# Patient Record
Sex: Female | Born: 1940 | Race: White | Hispanic: No | Smoking: Never smoker
Health system: Southern US, Community
[De-identification: ages and names within clinical notes are randomized; demographics above are authoritative.]

## PROBLEM LIST (undated history)

## (undated) DIAGNOSIS — E119 Type 2 diabetes mellitus without complications: Secondary | ICD-10-CM

## (undated) DIAGNOSIS — H353290 Exudative age-related macular degeneration, unspecified eye, stage unspecified: Secondary | ICD-10-CM

## (undated) DIAGNOSIS — I10 Essential (primary) hypertension: Secondary | ICD-10-CM

## (undated) DIAGNOSIS — F32A Depression, unspecified: Secondary | ICD-10-CM

## (undated) DIAGNOSIS — J309 Allergic rhinitis, unspecified: Secondary | ICD-10-CM

## (undated) DIAGNOSIS — E785 Hyperlipidemia, unspecified: Secondary | ICD-10-CM

## (undated) DIAGNOSIS — N816 Rectocele: Secondary | ICD-10-CM

## (undated) DIAGNOSIS — K219 Gastro-esophageal reflux disease without esophagitis: Secondary | ICD-10-CM

## (undated) DIAGNOSIS — M1990 Unspecified osteoarthritis, unspecified site: Secondary | ICD-10-CM

## (undated) DIAGNOSIS — E079 Disorder of thyroid, unspecified: Secondary | ICD-10-CM

## (undated) DIAGNOSIS — E7402 Pompe disease: Secondary | ICD-10-CM

## (undated) DIAGNOSIS — G4730 Sleep apnea, unspecified: Secondary | ICD-10-CM

## (undated) DIAGNOSIS — E039 Hypothyroidism, unspecified: Secondary | ICD-10-CM

## (undated) DIAGNOSIS — F329 Major depressive disorder, single episode, unspecified: Secondary | ICD-10-CM

## (undated) DIAGNOSIS — G629 Polyneuropathy, unspecified: Secondary | ICD-10-CM

## (undated) DIAGNOSIS — T8859XA Other complications of anesthesia, initial encounter: Secondary | ICD-10-CM

## (undated) DIAGNOSIS — T4145XA Adverse effect of unspecified anesthetic, initial encounter: Secondary | ICD-10-CM

## (undated) HISTORY — DX: Depression, unspecified: F32.A

## (undated) HISTORY — DX: Exudative age-related macular degeneration, unspecified eye, stage unspecified: H35.3290

## (undated) HISTORY — PX: BREAST SURGERY: SHX581

## (undated) HISTORY — DX: Hyperlipidemia, unspecified: E78.5

## (undated) HISTORY — DX: Type 2 diabetes mellitus without complications: E11.9

## (undated) HISTORY — DX: Disorder of thyroid, unspecified: E07.9

## (undated) HISTORY — DX: Major depressive disorder, single episode, unspecified: F32.9

## (undated) HISTORY — PX: TONSILECTOMY, ADENOIDECTOMY, BILATERAL MYRINGOTOMY AND TUBES: SHX2538

## (undated) HISTORY — DX: Rectocele: N81.6

## (undated) HISTORY — DX: Pompe disease: E74.02

---

## 1973-01-09 HISTORY — PX: AUGMENTATION MAMMAPLASTY: SUR837

## 1973-01-09 HISTORY — PX: PLACEMENT OF BREAST IMPLANTS: SHX6334

## 1990-01-09 HISTORY — PX: PARATHYROIDECTOMY: SHX19

## 2003-01-10 HISTORY — PX: COLONOSCOPY: SHX174

## 2003-12-24 ENCOUNTER — Ambulatory Visit: Payer: Self-pay | Admitting: General Surgery

## 2004-11-14 ENCOUNTER — Ambulatory Visit: Payer: Self-pay | Admitting: Family Medicine

## 2004-12-09 ENCOUNTER — Ambulatory Visit: Payer: Self-pay | Admitting: Family Medicine

## 2005-01-09 ENCOUNTER — Ambulatory Visit: Payer: Self-pay | Admitting: Family Medicine

## 2005-05-01 ENCOUNTER — Ambulatory Visit: Payer: Self-pay | Admitting: Unknown Physician Specialty

## 2005-11-03 ENCOUNTER — Ambulatory Visit: Payer: Self-pay | Admitting: Cardiology

## 2006-05-21 ENCOUNTER — Ambulatory Visit: Payer: Self-pay | Admitting: Unknown Physician Specialty

## 2006-05-24 ENCOUNTER — Ambulatory Visit: Payer: Self-pay | Admitting: Unknown Physician Specialty

## 2007-06-04 ENCOUNTER — Ambulatory Visit: Payer: Self-pay | Admitting: Unknown Physician Specialty

## 2007-07-25 LAB — HM DEXA SCAN: HM Dexa Scan: NORMAL

## 2007-11-22 ENCOUNTER — Ambulatory Visit: Payer: Self-pay | Admitting: General Surgery

## 2008-04-09 ENCOUNTER — Ambulatory Visit: Payer: Self-pay | Admitting: Ophthalmology

## 2008-04-21 ENCOUNTER — Ambulatory Visit: Payer: Self-pay | Admitting: Ophthalmology

## 2008-06-11 ENCOUNTER — Ambulatory Visit: Payer: Self-pay | Admitting: Unknown Physician Specialty

## 2008-07-17 ENCOUNTER — Ambulatory Visit: Payer: Self-pay | Admitting: Ophthalmology

## 2009-02-22 ENCOUNTER — Ambulatory Visit: Payer: Self-pay | Admitting: Family Medicine

## 2009-06-17 ENCOUNTER — Ambulatory Visit: Payer: Self-pay | Admitting: Unknown Physician Specialty

## 2009-09-07 ENCOUNTER — Ambulatory Visit: Payer: Self-pay | Admitting: Gastroenterology

## 2010-04-23 ENCOUNTER — Ambulatory Visit: Payer: Self-pay | Admitting: Family Medicine

## 2010-04-28 ENCOUNTER — Ambulatory Visit: Payer: Self-pay | Admitting: Family Medicine

## 2010-06-30 ENCOUNTER — Ambulatory Visit: Payer: Self-pay | Admitting: Neurosurgery

## 2010-07-11 ENCOUNTER — Ambulatory Visit: Payer: Self-pay | Admitting: Family Medicine

## 2010-08-25 ENCOUNTER — Ambulatory Visit: Payer: Self-pay | Admitting: Family Medicine

## 2010-12-21 ENCOUNTER — Ambulatory Visit: Payer: Self-pay | Admitting: Family Medicine

## 2011-01-27 DIAGNOSIS — M722 Plantar fascial fibromatosis: Secondary | ICD-10-CM | POA: Diagnosis not present

## 2011-02-21 DIAGNOSIS — J329 Chronic sinusitis, unspecified: Secondary | ICD-10-CM | POA: Diagnosis not present

## 2011-02-21 DIAGNOSIS — J019 Acute sinusitis, unspecified: Secondary | ICD-10-CM | POA: Diagnosis not present

## 2011-02-21 DIAGNOSIS — E119 Type 2 diabetes mellitus without complications: Secondary | ICD-10-CM | POA: Diagnosis not present

## 2011-02-21 DIAGNOSIS — J309 Allergic rhinitis, unspecified: Secondary | ICD-10-CM | POA: Diagnosis not present

## 2011-05-15 DIAGNOSIS — E039 Hypothyroidism, unspecified: Secondary | ICD-10-CM | POA: Diagnosis not present

## 2011-05-15 DIAGNOSIS — I10 Essential (primary) hypertension: Secondary | ICD-10-CM | POA: Diagnosis not present

## 2011-05-15 DIAGNOSIS — E119 Type 2 diabetes mellitus without complications: Secondary | ICD-10-CM | POA: Diagnosis not present

## 2011-05-15 DIAGNOSIS — E7800 Pure hypercholesterolemia, unspecified: Secondary | ICD-10-CM | POA: Diagnosis not present

## 2011-05-31 DIAGNOSIS — I10 Essential (primary) hypertension: Secondary | ICD-10-CM | POA: Diagnosis not present

## 2011-05-31 DIAGNOSIS — E7800 Pure hypercholesterolemia, unspecified: Secondary | ICD-10-CM | POA: Diagnosis not present

## 2011-05-31 DIAGNOSIS — E039 Hypothyroidism, unspecified: Secondary | ICD-10-CM | POA: Diagnosis not present

## 2011-07-18 DIAGNOSIS — E039 Hypothyroidism, unspecified: Secondary | ICD-10-CM | POA: Diagnosis not present

## 2011-07-18 DIAGNOSIS — Z0000 Encounter for general adult medical examination without abnormal findings: Secondary | ICD-10-CM | POA: Diagnosis not present

## 2011-07-18 DIAGNOSIS — I10 Essential (primary) hypertension: Secondary | ICD-10-CM | POA: Diagnosis not present

## 2011-07-18 DIAGNOSIS — E7800 Pure hypercholesterolemia, unspecified: Secondary | ICD-10-CM | POA: Diagnosis not present

## 2011-07-21 DIAGNOSIS — M949 Disorder of cartilage, unspecified: Secondary | ICD-10-CM | POA: Diagnosis not present

## 2011-07-21 DIAGNOSIS — M899 Disorder of bone, unspecified: Secondary | ICD-10-CM | POA: Diagnosis not present

## 2011-07-21 DIAGNOSIS — E2839 Other primary ovarian failure: Secondary | ICD-10-CM | POA: Diagnosis not present

## 2011-07-21 DIAGNOSIS — N951 Menopausal and female climacteric states: Secondary | ICD-10-CM | POA: Diagnosis not present

## 2011-08-17 ENCOUNTER — Ambulatory Visit: Payer: Self-pay | Admitting: Family Medicine

## 2011-08-17 DIAGNOSIS — Z1231 Encounter for screening mammogram for malignant neoplasm of breast: Secondary | ICD-10-CM | POA: Diagnosis not present

## 2011-10-06 DIAGNOSIS — M722 Plantar fascial fibromatosis: Secondary | ICD-10-CM | POA: Diagnosis not present

## 2011-11-03 DIAGNOSIS — M722 Plantar fascial fibromatosis: Secondary | ICD-10-CM | POA: Diagnosis not present

## 2011-11-21 DIAGNOSIS — E890 Postprocedural hypothyroidism: Secondary | ICD-10-CM | POA: Diagnosis not present

## 2011-11-21 DIAGNOSIS — R5381 Other malaise: Secondary | ICD-10-CM | POA: Diagnosis not present

## 2011-11-21 DIAGNOSIS — E119 Type 2 diabetes mellitus without complications: Secondary | ICD-10-CM | POA: Diagnosis not present

## 2011-11-24 DIAGNOSIS — M722 Plantar fascial fibromatosis: Secondary | ICD-10-CM | POA: Diagnosis not present

## 2011-11-27 DIAGNOSIS — E039 Hypothyroidism, unspecified: Secondary | ICD-10-CM | POA: Diagnosis not present

## 2011-11-27 DIAGNOSIS — E119 Type 2 diabetes mellitus without complications: Secondary | ICD-10-CM | POA: Diagnosis not present

## 2011-11-27 DIAGNOSIS — E7800 Pure hypercholesterolemia, unspecified: Secondary | ICD-10-CM | POA: Diagnosis not present

## 2011-11-27 DIAGNOSIS — G4700 Insomnia, unspecified: Secondary | ICD-10-CM | POA: Diagnosis not present

## 2011-12-20 DIAGNOSIS — L820 Inflamed seborrheic keratosis: Secondary | ICD-10-CM | POA: Diagnosis not present

## 2011-12-20 DIAGNOSIS — L819 Disorder of pigmentation, unspecified: Secondary | ICD-10-CM | POA: Diagnosis not present

## 2011-12-20 DIAGNOSIS — D239 Other benign neoplasm of skin, unspecified: Secondary | ICD-10-CM | POA: Diagnosis not present

## 2011-12-20 DIAGNOSIS — L570 Actinic keratosis: Secondary | ICD-10-CM | POA: Diagnosis not present

## 2011-12-20 DIAGNOSIS — L738 Other specified follicular disorders: Secondary | ICD-10-CM | POA: Diagnosis not present

## 2012-02-05 DIAGNOSIS — G4700 Insomnia, unspecified: Secondary | ICD-10-CM | POA: Diagnosis not present

## 2012-02-05 DIAGNOSIS — E7800 Pure hypercholesterolemia, unspecified: Secondary | ICD-10-CM | POA: Diagnosis not present

## 2012-02-05 DIAGNOSIS — J320 Chronic maxillary sinusitis: Secondary | ICD-10-CM | POA: Diagnosis not present

## 2012-02-05 DIAGNOSIS — E039 Hypothyroidism, unspecified: Secondary | ICD-10-CM | POA: Diagnosis not present

## 2012-04-18 DIAGNOSIS — L820 Inflamed seborrheic keratosis: Secondary | ICD-10-CM | POA: Diagnosis not present

## 2012-04-18 DIAGNOSIS — L819 Disorder of pigmentation, unspecified: Secondary | ICD-10-CM | POA: Diagnosis not present

## 2012-04-18 DIAGNOSIS — L821 Other seborrheic keratosis: Secondary | ICD-10-CM | POA: Diagnosis not present

## 2012-04-18 DIAGNOSIS — L570 Actinic keratosis: Secondary | ICD-10-CM | POA: Diagnosis not present

## 2012-05-17 DIAGNOSIS — M722 Plantar fascial fibromatosis: Secondary | ICD-10-CM | POA: Diagnosis not present

## 2012-05-20 DIAGNOSIS — E0590 Thyrotoxicosis, unspecified without thyrotoxic crisis or storm: Secondary | ICD-10-CM | POA: Diagnosis not present

## 2012-05-20 DIAGNOSIS — E119 Type 2 diabetes mellitus without complications: Secondary | ICD-10-CM | POA: Diagnosis not present

## 2012-05-30 DIAGNOSIS — E039 Hypothyroidism, unspecified: Secondary | ICD-10-CM | POA: Diagnosis not present

## 2012-05-30 DIAGNOSIS — E119 Type 2 diabetes mellitus without complications: Secondary | ICD-10-CM | POA: Diagnosis not present

## 2012-05-30 DIAGNOSIS — E209 Hypoparathyroidism, unspecified: Secondary | ICD-10-CM | POA: Diagnosis not present

## 2012-07-23 DIAGNOSIS — E039 Hypothyroidism, unspecified: Secondary | ICD-10-CM | POA: Diagnosis not present

## 2012-07-23 DIAGNOSIS — Z0000 Encounter for general adult medical examination without abnormal findings: Secondary | ICD-10-CM | POA: Diagnosis not present

## 2012-07-23 DIAGNOSIS — E7800 Pure hypercholesterolemia, unspecified: Secondary | ICD-10-CM | POA: Diagnosis not present

## 2012-07-23 DIAGNOSIS — G4700 Insomnia, unspecified: Secondary | ICD-10-CM | POA: Diagnosis not present

## 2012-07-24 DIAGNOSIS — Z124 Encounter for screening for malignant neoplasm of cervix: Secondary | ICD-10-CM | POA: Diagnosis not present

## 2012-07-24 DIAGNOSIS — Z01419 Encounter for gynecological examination (general) (routine) without abnormal findings: Secondary | ICD-10-CM | POA: Diagnosis not present

## 2012-07-24 DIAGNOSIS — Z1151 Encounter for screening for human papillomavirus (HPV): Secondary | ICD-10-CM | POA: Diagnosis not present

## 2012-07-24 DIAGNOSIS — Z1231 Encounter for screening mammogram for malignant neoplasm of breast: Secondary | ICD-10-CM | POA: Diagnosis not present

## 2012-08-15 DIAGNOSIS — L819 Disorder of pigmentation, unspecified: Secondary | ICD-10-CM | POA: Diagnosis not present

## 2012-08-15 DIAGNOSIS — L820 Inflamed seborrheic keratosis: Secondary | ICD-10-CM | POA: Diagnosis not present

## 2012-08-15 DIAGNOSIS — L821 Other seborrheic keratosis: Secondary | ICD-10-CM | POA: Diagnosis not present

## 2012-12-26 DIAGNOSIS — J329 Chronic sinusitis, unspecified: Secondary | ICD-10-CM | POA: Diagnosis not present

## 2012-12-26 DIAGNOSIS — E785 Hyperlipidemia, unspecified: Secondary | ICD-10-CM | POA: Diagnosis not present

## 2012-12-26 DIAGNOSIS — I10 Essential (primary) hypertension: Secondary | ICD-10-CM | POA: Diagnosis not present

## 2012-12-26 DIAGNOSIS — E119 Type 2 diabetes mellitus without complications: Secondary | ICD-10-CM | POA: Diagnosis not present

## 2012-12-26 DIAGNOSIS — E039 Hypothyroidism, unspecified: Secondary | ICD-10-CM | POA: Diagnosis not present

## 2012-12-26 DIAGNOSIS — R5381 Other malaise: Secondary | ICD-10-CM | POA: Diagnosis not present

## 2012-12-26 DIAGNOSIS — J069 Acute upper respiratory infection, unspecified: Secondary | ICD-10-CM | POA: Diagnosis not present

## 2013-01-06 DIAGNOSIS — E7800 Pure hypercholesterolemia, unspecified: Secondary | ICD-10-CM | POA: Diagnosis not present

## 2013-01-06 DIAGNOSIS — E119 Type 2 diabetes mellitus without complications: Secondary | ICD-10-CM | POA: Diagnosis not present

## 2013-01-06 DIAGNOSIS — E039 Hypothyroidism, unspecified: Secondary | ICD-10-CM | POA: Diagnosis not present

## 2013-01-20 DIAGNOSIS — E119 Type 2 diabetes mellitus without complications: Secondary | ICD-10-CM | POA: Diagnosis not present

## 2013-01-20 DIAGNOSIS — I10 Essential (primary) hypertension: Secondary | ICD-10-CM | POA: Diagnosis not present

## 2013-01-20 DIAGNOSIS — J329 Chronic sinusitis, unspecified: Secondary | ICD-10-CM | POA: Diagnosis not present

## 2013-01-20 DIAGNOSIS — R5381 Other malaise: Secondary | ICD-10-CM | POA: Diagnosis not present

## 2013-01-20 DIAGNOSIS — E785 Hyperlipidemia, unspecified: Secondary | ICD-10-CM | POA: Diagnosis not present

## 2013-01-20 DIAGNOSIS — J069 Acute upper respiratory infection, unspecified: Secondary | ICD-10-CM | POA: Diagnosis not present

## 2013-01-20 DIAGNOSIS — E039 Hypothyroidism, unspecified: Secondary | ICD-10-CM | POA: Diagnosis not present

## 2013-01-20 DIAGNOSIS — R5383 Other fatigue: Secondary | ICD-10-CM | POA: Diagnosis not present

## 2013-02-14 DIAGNOSIS — E785 Hyperlipidemia, unspecified: Secondary | ICD-10-CM | POA: Diagnosis not present

## 2013-02-14 DIAGNOSIS — E039 Hypothyroidism, unspecified: Secondary | ICD-10-CM | POA: Diagnosis not present

## 2013-02-14 DIAGNOSIS — S91309A Unspecified open wound, unspecified foot, initial encounter: Secondary | ICD-10-CM | POA: Diagnosis not present

## 2013-02-14 DIAGNOSIS — I10 Essential (primary) hypertension: Secondary | ICD-10-CM | POA: Diagnosis not present

## 2013-02-14 DIAGNOSIS — J069 Acute upper respiratory infection, unspecified: Secondary | ICD-10-CM | POA: Diagnosis not present

## 2013-02-14 DIAGNOSIS — Z23 Encounter for immunization: Secondary | ICD-10-CM | POA: Diagnosis not present

## 2013-02-14 DIAGNOSIS — S0083XA Contusion of other part of head, initial encounter: Secondary | ICD-10-CM | POA: Diagnosis not present

## 2013-02-14 DIAGNOSIS — J329 Chronic sinusitis, unspecified: Secondary | ICD-10-CM | POA: Diagnosis not present

## 2013-02-14 DIAGNOSIS — S0003XA Contusion of scalp, initial encounter: Secondary | ICD-10-CM | POA: Diagnosis not present

## 2013-07-14 DIAGNOSIS — I10 Essential (primary) hypertension: Secondary | ICD-10-CM | POA: Diagnosis not present

## 2013-07-14 DIAGNOSIS — E7800 Pure hypercholesterolemia, unspecified: Secondary | ICD-10-CM | POA: Diagnosis not present

## 2013-07-14 DIAGNOSIS — F411 Generalized anxiety disorder: Secondary | ICD-10-CM | POA: Diagnosis not present

## 2013-07-14 DIAGNOSIS — F320 Major depressive disorder, single episode, mild: Secondary | ICD-10-CM | POA: Diagnosis not present

## 2013-07-14 DIAGNOSIS — R5381 Other malaise: Secondary | ICD-10-CM | POA: Diagnosis not present

## 2013-07-14 DIAGNOSIS — R5383 Other fatigue: Secondary | ICD-10-CM | POA: Diagnosis not present

## 2013-07-15 DIAGNOSIS — G5600 Carpal tunnel syndrome, unspecified upper limb: Secondary | ICD-10-CM | POA: Diagnosis not present

## 2013-08-20 DIAGNOSIS — F320 Major depressive disorder, single episode, mild: Secondary | ICD-10-CM | POA: Diagnosis not present

## 2013-08-20 DIAGNOSIS — F411 Generalized anxiety disorder: Secondary | ICD-10-CM | POA: Diagnosis not present

## 2013-08-20 DIAGNOSIS — E7800 Pure hypercholesterolemia, unspecified: Secondary | ICD-10-CM | POA: Diagnosis not present

## 2013-08-20 DIAGNOSIS — I10 Essential (primary) hypertension: Secondary | ICD-10-CM | POA: Diagnosis not present

## 2013-08-20 DIAGNOSIS — J019 Acute sinusitis, unspecified: Secondary | ICD-10-CM | POA: Diagnosis not present

## 2013-08-22 ENCOUNTER — Ambulatory Visit (INDEPENDENT_AMBULATORY_CARE_PROVIDER_SITE_OTHER): Payer: Medicare Other | Admitting: Podiatry

## 2013-08-22 ENCOUNTER — Ambulatory Visit (INDEPENDENT_AMBULATORY_CARE_PROVIDER_SITE_OTHER): Payer: Medicare Other

## 2013-08-22 ENCOUNTER — Other Ambulatory Visit: Payer: Self-pay | Admitting: *Deleted

## 2013-08-22 ENCOUNTER — Encounter: Payer: Self-pay | Admitting: Podiatry

## 2013-08-22 VITALS — BP 113/68 | HR 81 | Resp 16 | Ht 60.0 in | Wt 148.0 lb

## 2013-08-22 DIAGNOSIS — M2020 Hallux rigidus, unspecified foot: Secondary | ICD-10-CM | POA: Diagnosis not present

## 2013-08-22 DIAGNOSIS — M779 Enthesopathy, unspecified: Secondary | ICD-10-CM

## 2013-08-22 DIAGNOSIS — M2021 Hallux rigidus, right foot: Secondary | ICD-10-CM

## 2013-08-22 MED ORDER — TRIAMCINOLONE ACETONIDE 10 MG/ML IJ SUSP
10.0000 mg | Freq: Once | INTRAMUSCULAR | Status: AC
  Administered 2013-08-22: 10 mg

## 2013-08-22 NOTE — Progress Notes (Signed)
   Subjective:    Patient ID: Nicole Young, female    DOB: [birth year], 73 y.o.   MRN: [id number]  HPI Comments: Right foot is acting up again      Review of Systems  HENT:       Sinus infection       Objective:   Physical Exam        Assessment & Plan:

## 2013-08-22 NOTE — Progress Notes (Signed)
Subjective:     Patient ID: Nicole Young, female   DOB: October 27, 1940, 73 y.o.   MRN: [id number]  HPI patient states I'm having pain in my right foot and also having pain in my big toe joint right. States it's been going on for months   Review of Systems     Objective:   Physical Exam Neurovascular status is intact with no history changes and tenderness noted third metatarsophalangeal joint right with inflammation and fluid buildup noted. Patient is also noted to have reduced range of motion and pain in the first metatarsal phalangeal joint right    Assessment:     Capsulitis third MPJ right of an acute nature and hallux limitus condition right with inflammation    Plan:     Reviewed both conditions and focused on the capsule first. Did a proximal nerve block and then aspirated the joint giving out a small amount of fluid and injected with half cc of dexamethasone Kenalog combination and applied thick plantar pad. Discussed hallux limitus and we will watch it and decide whether or not eventually it we'll need correction

## 2013-08-26 DIAGNOSIS — M76899 Other specified enthesopathies of unspecified lower limb, excluding foot: Secondary | ICD-10-CM | POA: Diagnosis not present

## 2013-08-26 DIAGNOSIS — G5600 Carpal tunnel syndrome, unspecified upper limb: Secondary | ICD-10-CM | POA: Diagnosis not present

## 2013-09-04 DIAGNOSIS — I10 Essential (primary) hypertension: Secondary | ICD-10-CM | POA: Diagnosis not present

## 2013-09-04 DIAGNOSIS — E119 Type 2 diabetes mellitus without complications: Secondary | ICD-10-CM | POA: Diagnosis not present

## 2013-09-04 DIAGNOSIS — J329 Chronic sinusitis, unspecified: Secondary | ICD-10-CM | POA: Diagnosis not present

## 2013-09-04 DIAGNOSIS — E7800 Pure hypercholesterolemia, unspecified: Secondary | ICD-10-CM | POA: Diagnosis not present

## 2013-09-04 DIAGNOSIS — J069 Acute upper respiratory infection, unspecified: Secondary | ICD-10-CM | POA: Diagnosis not present

## 2013-09-12 ENCOUNTER — Ambulatory Visit (INDEPENDENT_AMBULATORY_CARE_PROVIDER_SITE_OTHER): Payer: Medicare Other | Admitting: Podiatry

## 2013-09-12 ENCOUNTER — Encounter: Payer: Self-pay | Admitting: Podiatry

## 2013-09-12 VITALS — BP 112/68 | HR 80 | Resp 16

## 2013-09-12 DIAGNOSIS — M779 Enthesopathy, unspecified: Secondary | ICD-10-CM | POA: Diagnosis not present

## 2013-09-12 DIAGNOSIS — M2020 Hallux rigidus, unspecified foot: Secondary | ICD-10-CM | POA: Diagnosis not present

## 2013-09-12 DIAGNOSIS — M2021 Hallux rigidus, right foot: Secondary | ICD-10-CM

## 2013-09-15 NOTE — Progress Notes (Signed)
Subjective:     Patient ID: Nicole Young, female   DOB: 1940/11/14, 73 y.o.   MRN: [id number]  HPI patient presents stating the pain has reduced and my orthotics are not working well and I go to [place] in 2 [employer] orthotics are wearing out   Review of Systems     Objective:   Physical Exam Neurovascular status intact with muscle strength adequate and well oriented x3. Patient does have moderate discomfort fourth MPJ right but quite a bit improved from previous visit     Assessment:     Improved capsulitis right fourth MPJ    Plan:

## 2013-09-19 ENCOUNTER — Encounter: Payer: Self-pay | Admitting: Podiatry

## 2013-10-13 ENCOUNTER — Telehealth: Payer: Self-pay | Admitting: *Deleted

## 2013-10-13 NOTE — Telephone Encounter (Signed)
CALLED AND LEFT MESSAGE LETTING PT KNOW ORTHOTICS ARE HERE. NO APPT NEEDED.

## 2013-10-14 ENCOUNTER — Encounter: Payer: Self-pay | Admitting: *Deleted

## 2013-10-14 NOTE — Progress Notes (Signed)
SENT PT POSTCARD LETTING HER KNOW ORTHOTICS ARE HERE.

## 2013-11-06 DIAGNOSIS — Z23 Encounter for immunization: Secondary | ICD-10-CM | POA: Diagnosis not present

## 2013-11-11 ENCOUNTER — Ambulatory Visit: Payer: Self-pay | Admitting: Family Medicine

## 2013-11-11 DIAGNOSIS — R072 Precordial pain: Secondary | ICD-10-CM | POA: Diagnosis not present

## 2013-11-11 DIAGNOSIS — Z1389 Encounter for screening for other disorder: Secondary | ICD-10-CM | POA: Diagnosis not present

## 2013-11-11 DIAGNOSIS — R05 Cough: Secondary | ICD-10-CM | POA: Diagnosis not present

## 2013-11-11 DIAGNOSIS — Z0000 Encounter for general adult medical examination without abnormal findings: Secondary | ICD-10-CM | POA: Diagnosis not present

## 2013-12-01 DIAGNOSIS — I10 Essential (primary) hypertension: Secondary | ICD-10-CM | POA: Diagnosis not present

## 2013-12-01 DIAGNOSIS — R5383 Other fatigue: Secondary | ICD-10-CM | POA: Diagnosis not present

## 2013-12-01 DIAGNOSIS — J050 Acute obstructive laryngitis [croup]: Secondary | ICD-10-CM | POA: Diagnosis not present

## 2013-12-01 DIAGNOSIS — E785 Hyperlipidemia, unspecified: Secondary | ICD-10-CM | POA: Diagnosis not present

## 2013-12-01 DIAGNOSIS — G2581 Restless legs syndrome: Secondary | ICD-10-CM | POA: Diagnosis not present

## 2013-12-01 DIAGNOSIS — E039 Hypothyroidism, unspecified: Secondary | ICD-10-CM | POA: Diagnosis not present

## 2013-12-01 DIAGNOSIS — E780 Pure hypercholesterolemia: Secondary | ICD-10-CM | POA: Diagnosis not present

## 2013-12-01 DIAGNOSIS — E119 Type 2 diabetes mellitus without complications: Secondary | ICD-10-CM | POA: Diagnosis not present

## 2013-12-01 LAB — TSH: TSH: 0.98 u[IU]/mL (ref <=5.90)

## 2013-12-08 DIAGNOSIS — D1724 Benign lipomatous neoplasm of skin and subcutaneous tissue of left leg: Secondary | ICD-10-CM | POA: Diagnosis not present

## 2013-12-08 DIAGNOSIS — M25562 Pain in left knee: Secondary | ICD-10-CM | POA: Diagnosis not present

## 2013-12-08 DIAGNOSIS — M71559 Other bursitis, not elsewhere classified, unspecified hip: Secondary | ICD-10-CM | POA: Diagnosis not present

## 2013-12-12 ENCOUNTER — Ambulatory Visit: Payer: Self-pay | Admitting: Family Medicine

## 2013-12-12 DIAGNOSIS — Z1231 Encounter for screening mammogram for malignant neoplasm of breast: Secondary | ICD-10-CM | POA: Diagnosis not present

## 2013-12-15 DIAGNOSIS — H26492 Other secondary cataract, left eye: Secondary | ICD-10-CM | POA: Diagnosis not present

## 2013-12-16 DIAGNOSIS — E039 Hypothyroidism, unspecified: Secondary | ICD-10-CM | POA: Diagnosis not present

## 2013-12-16 DIAGNOSIS — Z23 Encounter for immunization: Secondary | ICD-10-CM | POA: Diagnosis not present

## 2013-12-16 DIAGNOSIS — I10 Essential (primary) hypertension: Secondary | ICD-10-CM | POA: Diagnosis not present

## 2013-12-16 DIAGNOSIS — E785 Hyperlipidemia, unspecified: Secondary | ICD-10-CM | POA: Diagnosis not present

## 2013-12-23 DIAGNOSIS — E119 Type 2 diabetes mellitus without complications: Secondary | ICD-10-CM | POA: Diagnosis not present

## 2014-01-12 DIAGNOSIS — Z23 Encounter for immunization: Secondary | ICD-10-CM | POA: Diagnosis not present

## 2014-01-12 DIAGNOSIS — E039 Hypothyroidism, unspecified: Secondary | ICD-10-CM | POA: Diagnosis not present

## 2014-01-12 DIAGNOSIS — I10 Essential (primary) hypertension: Secondary | ICD-10-CM | POA: Diagnosis not present

## 2014-01-12 DIAGNOSIS — J069 Acute upper respiratory infection, unspecified: Secondary | ICD-10-CM | POA: Diagnosis not present

## 2014-01-21 DIAGNOSIS — J329 Chronic sinusitis, unspecified: Secondary | ICD-10-CM | POA: Diagnosis not present

## 2014-01-21 DIAGNOSIS — R05 Cough: Secondary | ICD-10-CM | POA: Diagnosis not present

## 2014-01-21 DIAGNOSIS — E785 Hyperlipidemia, unspecified: Secondary | ICD-10-CM | POA: Diagnosis not present

## 2014-01-21 DIAGNOSIS — Z23 Encounter for immunization: Secondary | ICD-10-CM | POA: Diagnosis not present

## 2014-01-29 DIAGNOSIS — E785 Hyperlipidemia, unspecified: Secondary | ICD-10-CM | POA: Diagnosis not present

## 2014-01-29 LAB — LIPID PANEL
CHOLESTEROL: 162 mg/dL (ref 0–200)
HDL: 67 mg/dL (ref 35–70)
LDL CALC: 73 mg/dL
LDl/HDL Ratio: 1.1
Triglycerides: 111 mg/dL (ref 40–160)

## 2014-02-04 DIAGNOSIS — L578 Other skin changes due to chronic exposure to nonionizing radiation: Secondary | ICD-10-CM | POA: Diagnosis not present

## 2014-02-04 DIAGNOSIS — L814 Other melanin hyperpigmentation: Secondary | ICD-10-CM | POA: Diagnosis not present

## 2014-02-04 DIAGNOSIS — L99 Other disorders of skin and subcutaneous tissue in diseases classified elsewhere: Secondary | ICD-10-CM | POA: Diagnosis not present

## 2014-02-04 DIAGNOSIS — D1800 Hemangioma unspecified site: Secondary | ICD-10-CM | POA: Diagnosis not present

## 2014-02-04 DIAGNOSIS — B079 Viral wart, unspecified: Secondary | ICD-10-CM | POA: Diagnosis not present

## 2014-02-04 DIAGNOSIS — L820 Inflamed seborrheic keratosis: Secondary | ICD-10-CM | POA: Diagnosis not present

## 2014-02-04 DIAGNOSIS — L821 Other seborrheic keratosis: Secondary | ICD-10-CM | POA: Diagnosis not present

## 2014-02-04 DIAGNOSIS — D229 Melanocytic nevi, unspecified: Secondary | ICD-10-CM | POA: Diagnosis not present

## 2014-02-04 DIAGNOSIS — Z1283 Encounter for screening for malignant neoplasm of skin: Secondary | ICD-10-CM | POA: Diagnosis not present

## 2014-03-10 DIAGNOSIS — N816 Rectocele: Secondary | ICD-10-CM | POA: Diagnosis not present

## 2014-03-10 DIAGNOSIS — Z01419 Encounter for gynecological examination (general) (routine) without abnormal findings: Secondary | ICD-10-CM | POA: Diagnosis not present

## 2014-03-25 DIAGNOSIS — B079 Viral wart, unspecified: Secondary | ICD-10-CM | POA: Diagnosis not present

## 2014-03-25 DIAGNOSIS — L821 Other seborrheic keratosis: Secondary | ICD-10-CM | POA: Diagnosis not present

## 2014-03-25 DIAGNOSIS — L820 Inflamed seborrheic keratosis: Secondary | ICD-10-CM | POA: Diagnosis not present

## 2014-03-30 ENCOUNTER — Encounter: Payer: Self-pay | Admitting: *Deleted

## 2014-03-31 DIAGNOSIS — E119 Type 2 diabetes mellitus without complications: Secondary | ICD-10-CM | POA: Diagnosis not present

## 2014-04-14 DIAGNOSIS — E119 Type 2 diabetes mellitus without complications: Secondary | ICD-10-CM | POA: Diagnosis not present

## 2014-04-15 ENCOUNTER — Encounter: Payer: Self-pay | Admitting: General Surgery

## 2014-04-16 ENCOUNTER — Ambulatory Visit (INDEPENDENT_AMBULATORY_CARE_PROVIDER_SITE_OTHER): Payer: Medicare Other | Admitting: General Surgery

## 2014-04-16 ENCOUNTER — Encounter: Payer: Self-pay | Admitting: General Surgery

## 2014-04-16 VITALS — BP 112/64 | HR 80 | Resp 10 | Ht 60.0 in | Wt 151.0 lb

## 2014-04-16 DIAGNOSIS — Z1211 Encounter for screening for malignant neoplasm of colon: Secondary | ICD-10-CM | POA: Diagnosis not present

## 2014-04-16 MED ORDER — POLYETHYLENE GLYCOL 3350 17 GM/SCOOP PO POWD: ORAL | Status: DC

## 2014-04-16 NOTE — Patient Instructions (Addendum)
Colonoscopy A colonoscopy is an exam to look at the entire large intestine (colon). This exam can help find problems such as tumors, polyps, inflammation, and areas of bleeding. The exam takes about 1 hour.  LET [facility] CARE PROVIDER KNOW ABOUT:   Any allergies you have.  All medicines you are taking, including vitamins, herbs, eye drops, creams, and over-the-counter medicines.  Previous problems you or members of your family have had with the use of anesthetics.  Any blood disorders you have.  Previous surgeries you have had.  Medical conditions you have. RISKS AND COMPLICATIONS  Generally, this is a safe procedure. However, as with any procedure, complications can occur. Possible complications include:  Bleeding.  Tearing or rupture of the colon wall.  Reaction to medicines given during the exam.  Infection (rare). BEFORE THE PROCEDURE   Ask your health care provider about changing or stopping your regular medicines.  You may be prescribed an oral bowel prep. This involves drinking a large amount of medicated liquid, starting the day before your procedure. The liquid will cause you to have multiple loose stools until your stool is almost clear or light green. This cleans out your colon in preparation for the procedure.  Do not eat or drink anything else once you have started the bowel prep, unless your health care provider tells you it is safe to do so.  Arrange for someone to drive you home after the procedure. PROCEDURE   You will be given medicine to help you relax (sedative).  You will lie on your side with your knees bent.  A long, flexible tube with a light and camera on the end (colonoscope) will be inserted through the rectum and into the colon. The camera sends video back to a computer screen as it moves through the colon. The colonoscope also releases carbon dioxide gas to inflate the colon. This helps your health care provider see the area better.  During  the exam, your health care provider may take a small tissue sample (biopsy) to be examined under a microscope if any abnormalities are found.  The exam is finished when the entire colon has been viewed. AFTER THE PROCEDURE   Do not drive for 24 hours after the exam.  You may have a small amount of blood in your stool.  You may pass moderate amounts of gas and have mild abdominal cramping or bloating. This is caused by the gas used to inflate your colon during the exam.  Ask when your test results will be ready and how you will get your results. Make sure you get your test results. Document Released: 12/24/1999 Document Revised: 10/16/2012 Document Reviewed: 09/02/2012 [facility] Patient Information 2015 [place], [place]. This information is not intended to replace advice given to you by your health care provider. Make sure you discuss any questions you have with your health care provider.  Patient has been scheduled for a colonoscopy on 06-24-14 at [facility]. This patient will hold Metformin day of colonoscopy prep and procedure.

## 2014-04-16 NOTE — Progress Notes (Signed)
Patient ID: Nicole Young, female   DOB: 1940/08/29, 74 y.o.   MRN: [id number]  Chief Complaint  Patient presents with  . Colonoscopy    HPI Nicole Young is a 74 y.o. female.  Here today to discuss having a colonoscopy. Denies any gastrointestinal issues. Her last colonoscopy was 2005. Denies family history of colon cancer. Bowels are regular and daily.   HPI  Past Medical History  Diagnosis Date  . Thyroid disease   . Diabetes mellitus without complication   . Depression   . Rectocele     Past Surgical History  Procedure Laterality Date  . Tonsilectomy, adenoidectomy, bilateral myringotomy and tubes    . Parathyroidectomy  1992  . Placement of breast implants  1975  . Colonoscopy  2005    Dr Bary Castilla    Family History  Problem Relation Age of Onset  . Cancer Father     prostate  . Hypertension Mother     Social History History  Substance Use Topics  . Smoking status: Never Smoker   . Smokeless tobacco: Never Used  . Alcohol Use: 0.0 oz/week    0 Standard drinks or equivalent per week     Comment: occasionally    Allergies  Allergen Reactions  . Neosporin [Neomycin-Bacitracin Zn-Polymyx] Swelling  . Polysorbate Other (See Comments)    Other Reaction: Other reaction ,   . Thimerosal Swelling    Current Outpatient Prescriptions  Medication Sig Dispense Refill  . Calcium-Vitamin D 600-200 MG-UNIT per tablet Take by mouth.    . Cholecalciferol (VITAMIN D) 2000 UNITS CAPS Take by mouth daily.    . CRESTOR 20 MG tablet Take 20 mg by mouth daily.     Marland Kitchen FARXIGA 10 MG TABS tablet 10 mg daily.     . Magnesium 200 MG TABS Take by mouth daily.    . metFORMIN (GLUCOPHAGE-XR) 500 MG 24 hr tablet Take 500 mg by mouth daily.     . Multiple Vitamin (MULTIVITAMIN) capsule Take 1 capsule by mouth daily.    . sertraline (ZOLOFT) 50 MG tablet Take 50 mg by mouth daily.     Marland Kitchen SYNTHROID 75 MCG tablet Take 75 mcg by mouth daily before breakfast.     . polyethylene  glycol powder (GLYCOLAX/MIRALAX) powder 255 grams one bottle for colonoscopy prep 255 g 0   No current facility-administered medications for this visit.    Review of Systems Review of Systems  Constitutional: Negative.   Respiratory: Negative.   Cardiovascular: Negative.     Blood pressure 112/64, pulse 80, resp. rate 10, height 5' (1.524 m), weight 151 lb (68.493 kg).  Physical Exam Physical Exam  Constitutional: She is oriented to person, place, and time. She appears well-developed and well-nourished.  Neck: Neck supple.  Cardiovascular: Normal rate, regular rhythm and normal heart sounds.   Pulmonary/Chest: Effort normal and breath sounds normal.  Lymphadenopathy:    She has no cervical adenopathy.  Neurological: She is alert and oriented to person, place, and time.  Skin: Skin is warm and dry.    Data Reviewed Screening colonoscopy completed 12/24/2003 was normal.  Assessment    Added for screening colonoscopy.    Plan        Colonoscopy with possible biopsy/polypectomy prn: Information regarding the procedure, including its potential risks and complications (including but not limited to perforation of the bowel, which may require emergency surgery to repair, and bleeding) was verbally given to the patient. Educational information regarding lower instestinal endoscopy  was given to the patient. Written instructions for how to complete the bowel prep using Miralax were provided. The importance of drinking ample fluids to avoid dehydration as a result of the prep emphasized.  Patient has been scheduled for a colonoscopy on 06-24-14 at [facility]. This patient will hold Metformin day of colonoscopy prep and procedure.  PCP:  Cranford Mon, Magdalene Molly, Forest Gleason 04/17/2014, 9:14 PM

## 2014-04-17 ENCOUNTER — Other Ambulatory Visit: Payer: Self-pay | Admitting: General Surgery

## 2014-04-17 DIAGNOSIS — Z1211 Encounter for screening for malignant neoplasm of colon: Secondary | ICD-10-CM

## 2014-05-04 DIAGNOSIS — E785 Hyperlipidemia, unspecified: Secondary | ICD-10-CM | POA: Diagnosis not present

## 2014-05-04 DIAGNOSIS — R1084 Generalized abdominal pain: Secondary | ICD-10-CM | POA: Diagnosis not present

## 2014-05-04 DIAGNOSIS — I10 Essential (primary) hypertension: Secondary | ICD-10-CM | POA: Diagnosis not present

## 2014-05-04 DIAGNOSIS — Z23 Encounter for immunization: Secondary | ICD-10-CM | POA: Diagnosis not present

## 2014-05-04 DIAGNOSIS — R109 Unspecified abdominal pain: Secondary | ICD-10-CM | POA: Diagnosis not present

## 2014-05-04 LAB — CBC AND DIFFERENTIAL
HEMATOCRIT: 43 % (ref 36–46)
HEMOGLOBIN: 14.2 g/dL (ref 12.0–16.0)
NEUTROS ABS: 5 /uL
PLATELETS: 245 10*3/uL (ref 150–399)
WBC: 8.8 10*3/mL

## 2014-05-04 LAB — HEPATIC FUNCTION PANEL
ALK PHOS: 47 U/L (ref 25–125)
ALT: 22 U/L (ref 7–35)
AST: 19 U/L (ref 13–35)

## 2014-05-04 LAB — BASIC METABOLIC PANEL
BUN: 19 mg/dL (ref 4–21)
Creatinine: 0.7 mg/dL (ref 0.5–1.1)
GLUCOSE: 122 mg/dL
SODIUM: 142 mmol/L (ref 137–147)

## 2014-05-05 ENCOUNTER — Other Ambulatory Visit: Payer: Self-pay | Admitting: Family Medicine

## 2014-05-05 DIAGNOSIS — R1011 Right upper quadrant pain: Secondary | ICD-10-CM

## 2014-05-14 ENCOUNTER — Ambulatory Visit: Payer: Self-pay

## 2014-05-18 ENCOUNTER — Other Ambulatory Visit: Payer: Self-pay | Admitting: Family Medicine

## 2014-05-18 ENCOUNTER — Ambulatory Visit
Admission: RE | Admit: 2014-05-18 | Discharge: 2014-05-18 | Disposition: A | Discharge status: Home / Self Care | Payer: Medicare Other | Source: Ambulatory Visit | Attending: Family Medicine | Admitting: Family Medicine

## 2014-05-18 DIAGNOSIS — R109 Unspecified abdominal pain: Secondary | ICD-10-CM | POA: Diagnosis not present

## 2014-05-18 DIAGNOSIS — R1011 Right upper quadrant pain: Secondary | ICD-10-CM

## 2014-05-18 DIAGNOSIS — K769 Liver disease, unspecified: Secondary | ICD-10-CM | POA: Insufficient documentation

## 2014-05-21 DIAGNOSIS — R109 Unspecified abdominal pain: Secondary | ICD-10-CM | POA: Diagnosis not present

## 2014-05-21 DIAGNOSIS — E119 Type 2 diabetes mellitus without complications: Secondary | ICD-10-CM | POA: Diagnosis not present

## 2014-05-21 DIAGNOSIS — R7309 Other abnormal glucose: Secondary | ICD-10-CM | POA: Diagnosis not present

## 2014-05-21 DIAGNOSIS — E039 Hypothyroidism, unspecified: Secondary | ICD-10-CM | POA: Diagnosis not present

## 2014-05-21 DIAGNOSIS — I10 Essential (primary) hypertension: Secondary | ICD-10-CM | POA: Diagnosis not present

## 2014-05-21 LAB — HEMOGLOBIN A1C: Hgb A1c MFr Bld: 6.6 % — AB (ref 4.0–6.0)

## 2014-06-24 ENCOUNTER — Encounter: Payer: Self-pay | Admitting: General Surgery

## 2014-06-24 ENCOUNTER — Ambulatory Visit
Admission: RE | Admit: 2014-06-24 | Discharge: 2014-06-24 | Disposition: A | Discharge status: Home / Self Care | Payer: Medicare Other | Source: Ambulatory Visit | Attending: General Surgery | Admitting: General Surgery

## 2014-06-24 ENCOUNTER — Ambulatory Visit: Payer: Medicare Other | Admitting: *Deleted

## 2014-06-24 ENCOUNTER — Encounter
Admission: RE | Disposition: A | Discharge status: Home / Self Care | Payer: Self-pay | Source: Ambulatory Visit | Attending: General Surgery

## 2014-06-24 DIAGNOSIS — N816 Rectocele: Secondary | ICD-10-CM | POA: Insufficient documentation

## 2014-06-24 DIAGNOSIS — K621 Rectal polyp: Secondary | ICD-10-CM | POA: Insufficient documentation

## 2014-06-24 DIAGNOSIS — D128 Benign neoplasm of rectum: Secondary | ICD-10-CM | POA: Diagnosis not present

## 2014-06-24 DIAGNOSIS — K5730 Diverticulosis of large intestine without perforation or abscess without bleeding: Secondary | ICD-10-CM | POA: Insufficient documentation

## 2014-06-24 DIAGNOSIS — Z888 Allergy status to other drugs, medicaments and biological substances status: Secondary | ICD-10-CM | POA: Insufficient documentation

## 2014-06-24 DIAGNOSIS — E119 Type 2 diabetes mellitus without complications: Secondary | ICD-10-CM | POA: Insufficient documentation

## 2014-06-24 DIAGNOSIS — Z79899 Other long term (current) drug therapy: Secondary | ICD-10-CM | POA: Insufficient documentation

## 2014-06-24 DIAGNOSIS — K635 Polyp of colon: Secondary | ICD-10-CM | POA: Diagnosis not present

## 2014-06-24 DIAGNOSIS — Z1211 Encounter for screening for malignant neoplasm of colon: Secondary | ICD-10-CM | POA: Insufficient documentation

## 2014-06-24 DIAGNOSIS — F329 Major depressive disorder, single episode, unspecified: Secondary | ICD-10-CM | POA: Diagnosis not present

## 2014-06-24 DIAGNOSIS — E079 Disorder of thyroid, unspecified: Secondary | ICD-10-CM | POA: Diagnosis not present

## 2014-06-24 HISTORY — PX: COLONOSCOPY: SHX5424

## 2014-06-24 LAB — GLUCOSE, CAPILLARY: Glucose-Capillary: 116 mg/dL — ABNORMAL HIGH (ref 65–99)

## 2014-06-24 SURGERY — COLONOSCOPY
Anesthesia: General

## 2014-06-24 MED ORDER — SODIUM CHLORIDE 0.9 % IV SOLN
INTRAVENOUS | Status: DC
  Administered 2014-06-24: 09:00:00 via INTRAVENOUS

## 2014-06-24 MED ORDER — LACTATED RINGERS IV SOLN
INTRAVENOUS | Status: DC | PRN
  Administered 2014-06-24: 09:00:00 via INTRAVENOUS

## 2014-06-24 MED ORDER — FENTANYL CITRATE (PF) 100 MCG/2ML IJ SOLN
INTRAMUSCULAR | Status: DC | PRN
  Administered 2014-06-24: 50 ug via INTRAVENOUS

## 2014-06-24 MED ORDER — MIDAZOLAM HCL 2 MG/2ML IJ SOLN
INTRAMUSCULAR | Status: DC | PRN
  Administered 2014-06-24: 1 mg via INTRAVENOUS

## 2014-06-24 MED ORDER — PROPOFOL INFUSION 10 MG/ML OPTIME
INTRAVENOUS | Status: DC | PRN
  Administered 2014-06-24: 100 ug/kg/min via INTRAVENOUS

## 2014-06-24 NOTE — Transfer of Care (Signed)
Immediate Anesthesia Transfer of Care Note  Patient: Nicole Young  Procedure(s) Performed: Procedure(s): COLONOSCOPY (N/A)  Patient Location: PACU  Anesthesia Type:General  Level of Consciousness: awake, alert  and oriented  Airway & Oxygen Therapy: Patient Spontanous Breathing  Post-op Assessment: Report given to RN  Post vital signs: stable  Last Vitals:  Filed Vitals:   06/24/14 1001  BP: 114/76  Pulse: 75  Temp: 36.4 C  Resp: 20    Complications: No apparent anesthesia complications

## 2014-06-24 NOTE — Op Note (Signed)
[facility] Gastroenterology Patient Name: Nicole Young Procedure Date: 06/24/2014 9:18 AM MRN: [id number] Account #: [id number] Date of Birth: [birth year] Admit Type: Outpatient Age: 74 Room: [facility] ENDO ROOM 1 Gender: Female Note Status: Finalized Procedure:         Colonoscopy Indications:       Screening for colorectal malignant neoplasm Providers:         Robert Bellow, MD Referring MD:      Janine Ores. Rosanna Randy, MD (Referring MD) Medicines:         Monitored Anesthesia Care Complications:     No immediate complications. Procedure:         Pre-Anesthesia Assessment:                    - Prior to the procedure, a History and Physical was                     performed, and patient medications, allergies and                     sensitivities were reviewed. The patient's tolerance of                     previous anesthesia was reviewed.                    - The risks and benefits of the procedure and the sedation                     options and risks were discussed with the patient. All                     questions were answered and informed consent was obtained.                    After obtaining informed consent, the colonoscope was                     passed under direct vision. Throughout the procedure, the                     patient's blood pressure, pulse, and oxygen saturations                     were monitored continuously. The Olympus CF-Q160AL                     colonoscope (S#. [id number]) was introduced through the anus                     and advanced to the the terminal ileum. The colonoscopy                     was performed without difficulty. The patient tolerated                     the procedure well. The quality of the bowel preparation                     was excellent. Findings:      A few medium-mouthed diverticula were found in the sigmoid colon and in       the ascending colon.      A 5 mm polyp was found in the rectum. The polyp was  sessile. Biopsies  were taken with a cold forceps for histology.      The retroflexed view of the distal rectum and anal verge was normal and       showed no anal or rectal abnormalities.      The terminal ileum appeared normal. Impression:        - Diverticulosis in the sigmoid colon and in the ascending                     colon.                    - One 5 mm polyp in the rectum. Biopsied.                    - The distal rectum and anal verge are normal on                     retroflexion view.                    - The examined portion of the ileum was normal. Recommendation:    - Telephone endoscopist for pathology results in 1 week. Procedure Code(s): --- Professional ---                    [phone], Colonoscopy, flexible; with biopsy, single or                     multiple Diagnosis Code(s): --- Professional ---                    K57.30, Diverticulosis of large intestine without                     perforation or abscess without bleeding                    K62.1, Rectal polyp                    Z12.11, Encounter for screening for malignant neoplasm of                     colon CPT copyright 2014 American Medical Association. All rights reserved. The codes documented in this report are preliminary and upon coder review may  be revised to meet current compliance requirements. Robert Bellow, MD 06/24/2014 9:58:30 AM This report has been signed electronically. Number of Addenda: 0 Note Initiated On: 06/24/2014 9:18 AM Scope Withdrawal Time: 0 hours 5 minutes 54 seconds  Total Procedure Duration: 0 hours 22 minutes 3 seconds       [facility]

## 2014-06-24 NOTE — Anesthesia Postprocedure Evaluation (Signed)
  Anesthesia Post-op Note  Patient: Nicole Young  Procedure(s) Performed: Procedure(s): COLONOSCOPY (N/A)  Anesthesia type:General  Patient location: PACU  Post pain: Pain level controlled  Post assessment: Post-op Vital signs reviewed, Patient's Cardiovascular Status Stable, Respiratory Function Stable, Patent Airway and No signs of Nausea or vomiting  Post vital signs: Reviewed and stable  Last Vitals:  Filed Vitals:   06/24/14 1030  BP: 131/64  Pulse: 86  Temp:   Resp: 13    Level of consciousness: awake, alert  and patient cooperative  Complications: No apparent anesthesia complications

## 2014-06-24 NOTE — H&P (Signed)
Robert Bellow, MD at 04/16/2014 2:01 PM     Status: Signed       Expand All Collapse All   Patient ID: Nicole Young, female DOB: May 28, 1940, 74 y.o. MRN: [id number]  Chief Complaint  Patient presents with  . Colonoscopy    HPI Nicole Young is a 74 y.o. female. Here today to discuss having a colonoscopy. Denies any gastrointestinal issues. Her last colonoscopy was 2005. Denies family history of colon cancer. Bowels are regular and daily.   HPI  Past Medical History  Diagnosis Date  . Thyroid disease   . Diabetes mellitus without complication   . Depression   . Rectocele     Past Surgical History  Procedure Laterality Date  . Tonsilectomy, adenoidectomy, bilateral myringotomy and tubes    . Parathyroidectomy  1992  . Placement of breast implants  1975  . Colonoscopy  2005    Dr Bary Castilla    Family History  Problem Relation Age of Onset  . Cancer Father     prostate  . Hypertension Mother     Social History History  Substance Use Topics  . Smoking status: Never Smoker   . Smokeless tobacco: Never Used  . Alcohol Use: 0.0 oz/week    0 Standard drinks or equivalent per week     Comment: occasionally    Allergies  Allergen Reactions  . Neosporin [Neomycin-Bacitracin Zn-Polymyx] Swelling  . Polysorbate Other (See Comments)    Other Reaction: Other reaction ,   . Thimerosal Swelling    Current Outpatient Prescriptions  Medication Sig Dispense Refill  . Calcium-Vitamin D 600-200 MG-UNIT per tablet Take by mouth.    . Cholecalciferol (VITAMIN D) 2000 UNITS CAPS Take by mouth daily.    . CRESTOR 20 MG tablet Take 20 mg by mouth daily.     Marland Kitchen FARXIGA 10 MG TABS tablet 10 mg daily.     . Magnesium 200 MG TABS Take by mouth daily.    . metFORMIN (GLUCOPHAGE-XR) 500 MG 24 hr tablet Take 500 mg by mouth daily.      . Multiple Vitamin (MULTIVITAMIN) capsule Take 1 capsule by mouth daily.    . sertraline (ZOLOFT) 50 MG tablet Take 50 mg by mouth daily.     Marland Kitchen SYNTHROID 75 MCG tablet Take 75 mcg by mouth daily before breakfast.     . polyethylene glycol powder (GLYCOLAX/MIRALAX) powder 255 grams one bottle for colonoscopy prep 255 g 0   No current facility-administered medications for this visit.    Review of Systems Review of Systems  Constitutional: Negative.  Respiratory: Negative.  Cardiovascular: Negative.    Blood pressure 112/64, pulse 80, resp. rate 10, height 5' (1.524 m), weight 151 lb (68.493 kg).  Physical Exam Physical Exam  Constitutional: She is oriented to person, place, and time. She appears well-developed and well-nourished.  Neck: Neck supple.  Cardiovascular: Normal rate, regular rhythm and normal heart sounds.  Pulmonary/Chest: Effort normal and breath sounds normal.  Lymphadenopathy:   She has no cervical adenopathy.  Neurological: She is alert and oriented to person, place, and time.  Skin: Skin is warm and dry.    Data Reviewed Screening colonoscopy completed 12/24/2003 was normal.  Assessment    Candidate for screening colonoscopy.    Plan        Colonoscopy with possible biopsy/polypectomy prn: Information regarding the procedure, including its potential risks and complications (including but not limited to perforation of the bowel, which may require emergency surgery  to repair, and bleeding) was verbally given to the patient. Educational information regarding lower instestinal endoscopy was given to the patient. Written instructions for how to complete the bowel prep using Miralax were provided. The importance of drinking ample fluids to avoid dehydration as a result of the prep emphasized.  Patient has been scheduled for a colonoscopy on 06-24-14 at [facility]. This patient will hold Metformin day of colonoscopy prep and procedure.      Robert Bellow

## 2014-06-24 NOTE — H&P (Signed)
Normal cardiopulmonary exam. Tolerated prep well. RUQ noted in April has resolved. Ultrasound in May 2016 was normal.  For screening colonoscopy today.

## 2014-06-24 NOTE — Discharge Instructions (Signed)
Resume metformin and your other medications tomorrow. Regular diabetic diet. No driving today.

## 2014-06-24 NOTE — Anesthesia Preprocedure Evaluation (Signed)
Anesthesia Evaluation  Patient identified by MRN, date of birth, ID band Patient awake    Reviewed: Allergy & Precautions, NPO status , Patient's Chart, lab work & pertinent test results  Airway Mallampati: II  TM Distance: >3 FB Neck ROM: Limited    Dental  (+) Teeth Intact   Pulmonary  breath sounds clear to auscultation  Pulmonary exam normal       Cardiovascular Normal cardiovascular examRhythm:Regular Rate:Normal     Neuro/Psych    GI/Hepatic   Endo/Other  diabetes, Well Controlled, Type 2BG 116 this morning.  Renal/GU      Musculoskeletal   Abdominal Normal abdominal exam  (+)   Peds  Hematology   Anesthesia Other Findings   Reproductive/Obstetrics                             Anesthesia Physical Anesthesia Plan  ASA: III  Anesthesia Plan: General   Post-op Pain Management:    Induction: Intravenous  Airway Management Planned: Simple Face Mask  Additional Equipment:   Intra-op Plan:   Post-operative Plan:   Informed Consent: I have reviewed the patients History and Physical, chart, labs and discussed the procedure including the risks, benefits and alternatives for the proposed anesthesia with the patient or authorized representative who has indicated his/her understanding and acceptance.     Plan Discussed with: CRNA  Anesthesia Plan Comments:         Anesthesia Quick Evaluation

## 2014-06-25 LAB — SURGICAL PATHOLOGY

## 2014-06-26 ENCOUNTER — Telehealth: Payer: Self-pay | Admitting: General Surgery

## 2014-06-26 NOTE — Telephone Encounter (Signed)
Message left that the polyp was benign. She does not require further colonoscopies.

## 2014-06-30 DIAGNOSIS — M7072 Other bursitis of hip, left hip: Secondary | ICD-10-CM | POA: Diagnosis not present

## 2014-06-30 DIAGNOSIS — M180 Bilateral primary osteoarthritis of first carpometacarpal joints: Secondary | ICD-10-CM | POA: Diagnosis not present

## 2014-06-30 DIAGNOSIS — G5601 Carpal tunnel syndrome, right upper limb: Secondary | ICD-10-CM | POA: Diagnosis not present

## 2014-06-30 DIAGNOSIS — M19049 Primary osteoarthritis, unspecified hand: Secondary | ICD-10-CM | POA: Insufficient documentation

## 2014-06-30 DIAGNOSIS — G5600 Carpal tunnel syndrome, unspecified upper limb: Secondary | ICD-10-CM | POA: Insufficient documentation

## 2014-06-30 HISTORY — DX: Carpal tunnel syndrome, unspecified upper limb: G56.00

## 2014-07-10 DIAGNOSIS — E119 Type 2 diabetes mellitus without complications: Secondary | ICD-10-CM | POA: Diagnosis not present

## 2014-07-17 DIAGNOSIS — G629 Polyneuropathy, unspecified: Secondary | ICD-10-CM | POA: Diagnosis not present

## 2014-07-17 DIAGNOSIS — E119 Type 2 diabetes mellitus without complications: Secondary | ICD-10-CM | POA: Diagnosis not present

## 2014-07-17 DIAGNOSIS — R202 Paresthesia of skin: Secondary | ICD-10-CM | POA: Diagnosis not present

## 2014-07-27 ENCOUNTER — Other Ambulatory Visit: Payer: Self-pay | Admitting: Family Medicine

## 2014-07-28 ENCOUNTER — Encounter
Admission: RE | Admit: 2014-07-28 | Discharge: 2014-07-28 | Disposition: A | Discharge status: Home / Self Care | Payer: Medicare Other | Source: Ambulatory Visit | Attending: Specialist | Admitting: Specialist

## 2014-07-28 DIAGNOSIS — I10 Essential (primary) hypertension: Secondary | ICD-10-CM | POA: Diagnosis not present

## 2014-07-28 DIAGNOSIS — Z01812 Encounter for preprocedural laboratory examination: Secondary | ICD-10-CM | POA: Insufficient documentation

## 2014-07-28 DIAGNOSIS — Z01810 Encounter for preprocedural cardiovascular examination: Secondary | ICD-10-CM | POA: Insufficient documentation

## 2014-07-28 HISTORY — DX: Adverse effect of unspecified anesthetic, initial encounter: T41.45XA

## 2014-07-28 HISTORY — DX: Unspecified osteoarthritis, unspecified site: M19.90

## 2014-07-28 HISTORY — DX: Other complications of anesthesia, initial encounter: T88.59XA

## 2014-07-28 HISTORY — DX: Polyneuropathy, unspecified: G62.9

## 2014-07-28 HISTORY — DX: Hypothyroidism, unspecified: E03.9

## 2014-07-28 LAB — BASIC METABOLIC PANEL
Anion gap: 8 (ref 5–15)
BUN: 21 mg/dL — ABNORMAL HIGH (ref 6–20)
CO2: 25 mmol/L (ref 22–32)
CREATININE: 0.69 mg/dL (ref 0.44–1.00)
Calcium: 9.1 mg/dL (ref 8.9–10.3)
Chloride: 101 mmol/L (ref 101–111)
GFR calc Af Amer: >60 mL/min (ref >60)
Glucose, Bld: 162 mg/dL — ABNORMAL HIGH (ref 65–99)
Potassium: 3.9 mmol/L (ref 3.5–5.1)
Sodium: 134 mmol/L — ABNORMAL LOW (ref 135–145)

## 2014-07-28 NOTE — Patient Instructions (Signed)
  Your procedure is scheduled on: 08/26/14 Wed Report to Day Surgery. To find out your arrival time please call [phone] between 1PM - 3PM on 08/25/14 Tues.  Remember: Instructions that are not followed completely may result in serious medical risk, up to and including death, or upon the discretion of your surgeon and anesthesiologist your surgery may need to be rescheduled.    _x___ 1. Do not eat food or drink liquids after midnight. No gum chewing or hard candies.     ____ 2. No Alcohol for 24 hours before or after surgery.   ____ 3. Bring all medications with you on the day of surgery if instructed.    __x__ 4. Notify your doctor if there is any change in your medical condition     (cold, fever, infections).     Do not wear jewelry, make-up, hairpins, clips or nail polish.  Do not wear lotions, powders, or perfumes. You may wear deodorant.  Do not shave 48 hours prior to surgery. Men may shave face and neck.  Do not bring valuables to the hospital.    [facility] is not responsible for any belongings or valuables.               Contacts, dentures or bridgework may not be worn into surgery.  Leave your suitcase in the car. After surgery it may be brought to your room.  For patients admitted to the hospital, discharge time is determined by your                treatment team.   Patients discharged the day of surgery will not be allowed to drive home.   Please read over the following fact sheets that you were given:      _x___ Take these medicines the morning of surgery with A SIP OF WATER:    1.SYNTHROID 75 MCG tablet  2.   3.   4.  5.  6.  ____ Fleet Enema (as directed)   _x___ Use CHG Soap as directed  ____ Use inhalers on the day of surgery  _x___ Stop metformin 2 days prior to surgery    ____ Take 1/2 of usual insulin dose the night before surgery and none on the morning of surgery.   ____ Stop Coumadin/Plavix/aspirin on   ____ Stop Anti-inflammatories  on   ____ Stop supplements until after surgery.    ____ Bring C-Pap to the hospital.

## 2014-08-17 ENCOUNTER — Other Ambulatory Visit: Payer: Self-pay

## 2014-08-26 ENCOUNTER — Encounter
Admission: RE | Disposition: A | Discharge status: Home / Self Care | Payer: Self-pay | Source: Ambulatory Visit | Attending: Specialist

## 2014-08-26 ENCOUNTER — Encounter: Payer: Self-pay | Admitting: *Deleted

## 2014-08-26 ENCOUNTER — Ambulatory Visit: Payer: Medicare Other | Admitting: Anesthesiology

## 2014-08-26 ENCOUNTER — Ambulatory Visit
Admission: RE | Admit: 2014-08-26 | Discharge: 2014-08-26 | Disposition: A | Discharge status: Home / Self Care | Payer: Medicare Other | Source: Ambulatory Visit | Attending: Specialist | Admitting: Specialist

## 2014-08-26 DIAGNOSIS — E039 Hypothyroidism, unspecified: Secondary | ICD-10-CM | POA: Insufficient documentation

## 2014-08-26 DIAGNOSIS — F329 Major depressive disorder, single episode, unspecified: Secondary | ICD-10-CM | POA: Insufficient documentation

## 2014-08-26 DIAGNOSIS — G629 Polyneuropathy, unspecified: Secondary | ICD-10-CM | POA: Insufficient documentation

## 2014-08-26 DIAGNOSIS — G5601 Carpal tunnel syndrome, right upper limb: Secondary | ICD-10-CM | POA: Insufficient documentation

## 2014-08-26 DIAGNOSIS — M1990 Unspecified osteoarthritis, unspecified site: Secondary | ICD-10-CM | POA: Insufficient documentation

## 2014-08-26 DIAGNOSIS — Z79899 Other long term (current) drug therapy: Secondary | ICD-10-CM | POA: Diagnosis not present

## 2014-08-26 DIAGNOSIS — E118 Type 2 diabetes mellitus with unspecified complications: Secondary | ICD-10-CM | POA: Diagnosis not present

## 2014-08-26 HISTORY — PX: CARPAL TUNNEL RELEASE: SHX101

## 2014-08-26 LAB — GLUCOSE, CAPILLARY
GLUCOSE-CAPILLARY: 141 mg/dL — AB (ref 65–99)
Glucose-Capillary: 140 mg/dL — ABNORMAL HIGH (ref 65–99)

## 2014-08-26 SURGERY — CARPAL TUNNEL RELEASE
Anesthesia: General | Site: Wrist | Laterality: Right | Wound class: Clean

## 2014-08-26 MED ORDER — PROPOFOL 10 MG/ML IV BOLUS
INTRAVENOUS | Status: DC | PRN
  Administered 2014-08-26: 100 mg via INTRAVENOUS

## 2014-08-26 MED ORDER — LIDOCAINE HCL (CARDIAC) 20 MG/ML IV SOLN
INTRAVENOUS | Status: DC | PRN
  Administered 2014-08-26: 100 mg via INTRAVENOUS

## 2014-08-26 MED ORDER — BUPIVACAINE HCL 0.5 % IJ SOLN
INTRAMUSCULAR | Status: DC | PRN
  Administered 2014-08-26: 16 mL

## 2014-08-26 MED ORDER — FAMOTIDINE 20 MG PO TABS
20.0000 mg | ORAL_TABLET | Freq: Once | ORAL | Status: AC
  Administered 2014-08-26: 20 mg via ORAL

## 2014-08-26 MED ORDER — FENTANYL CITRATE (PF) 100 MCG/2ML IJ SOLN
INTRAMUSCULAR | Status: DC | PRN
  Administered 2014-08-26: 100 ug via INTRAVENOUS

## 2014-08-26 MED ORDER — KETAMINE HCL 50 MG/ML IJ SOLN
INTRAMUSCULAR | Status: DC | PRN
  Administered 2014-08-26: 20 mg via INTRAMUSCULAR

## 2014-08-26 MED ORDER — MELOXICAM 7.5 MG PO TABS
15.0000 mg | ORAL_TABLET | Freq: Once | ORAL | Status: AC
  Administered 2014-08-26: 15 mg via ORAL

## 2014-08-26 MED ORDER — CEFAZOLIN SODIUM 1-5 GM-% IV SOLN
1.0000 g | Freq: Once | INTRAVENOUS | Status: AC
  Administered 2014-08-26: 1 g via INTRAVENOUS

## 2014-08-26 MED ORDER — ONDANSETRON HCL 4 MG/2ML IJ SOLN
INTRAMUSCULAR | Status: DC | PRN
  Administered 2014-08-26: 4 mg via INTRAVENOUS

## 2014-08-26 MED ORDER — SODIUM CHLORIDE 0.9 % IV SOLN
INTRAVENOUS | Status: DC
  Administered 2014-08-26: 07:00:00 via INTRAVENOUS

## 2014-08-26 MED ORDER — GABAPENTIN 400 MG PO CAPS
400.0000 mg | ORAL_CAPSULE | Freq: Once | ORAL | Status: AC
  Administered 2014-08-26: 400 mg via ORAL

## 2014-08-26 MED ORDER — DEXAMETHASONE SODIUM PHOSPHATE 4 MG/ML IJ SOLN
INTRAMUSCULAR | Status: DC | PRN
  Administered 2014-08-26: 5 mg via INTRAVENOUS

## 2014-08-26 MED ORDER — EPHEDRINE SULFATE 50 MG/ML IJ SOLN
INTRAMUSCULAR | Status: DC | PRN
  Administered 2014-08-26: 15 mg via INTRAVENOUS

## 2014-08-26 SURGICAL SUPPLY — 21 items
BLADE SURG MINI STRL (BLADE) ×3 IMPLANT
BNDG ESMARK 4X12 TAN STRL LF (GAUZE/BANDAGES/DRESSINGS) ×3 IMPLANT
CANISTER SUCT 1200ML W/VALVE (MISCELLANEOUS) ×3 IMPLANT
CHLORAPREP W/TINT 26ML (MISCELLANEOUS) ×3 IMPLANT
GAUZE FLUFF 18X24 1PLY STRL (GAUZE/BANDAGES/DRESSINGS) ×3 IMPLANT
GAUZE PETRO XEROFOAM 1X8 (MISCELLANEOUS) ×3 IMPLANT
GLOVE BIO SURGEON STRL SZ8 (GLOVE) ×3 IMPLANT
GOWN STRL REUS W/ TWL LRG LVL3 (GOWN DISPOSABLE) ×2 IMPLANT
GOWN STRL REUS W/TWL LRG LVL3 (GOWN DISPOSABLE) ×4
KIT RM TURNOVER STRD PROC AR (KITS) ×3 IMPLANT
NS IRRIG 500ML POUR BTL (IV SOLUTION) ×3 IMPLANT
PACK EXTREMITY ARMC (MISCELLANEOUS) ×3 IMPLANT
PAD GROUND ADULT SPLIT (MISCELLANEOUS) ×3 IMPLANT
PAD PREP 24X41 OB/GYN DISP (PERSONAL CARE ITEMS) IMPLANT
SPLINT CAST 1 STEP 3X12 (MISCELLANEOUS) ×3 IMPLANT
STOCKINETTE BIAS CUT 4 980044 (GAUZE/BANDAGES/DRESSINGS) ×3 IMPLANT
STOCKINETTE STRL 4IN 9604848 (GAUZE/BANDAGES/DRESSINGS) ×3 IMPLANT
SUT ETHILON 4-0 (SUTURE) ×2
SUT ETHILON 4-0 FS2 18XMFL BLK (SUTURE) ×1
SUT ETHILON 5-0 FS-2 18 BLK (SUTURE) IMPLANT
SUTURE ETHLN 4-0 FS2 18XMF BLK (SUTURE) ×1 IMPLANT

## 2014-08-26 NOTE — Discharge Instructions (Signed)
AMBULATORY SURGERY  DISCHARGE INSTRUCTIONS   1) The drugs that you were given will stay in your system until tomorrow so for the next 24 hours you should not:  A) Drive an automobile B) Make any legal decisions C) Drink any alcoholic beverage   2) You may resume regular meals tomorrow.  Today it is better to start with liquids and gradually work up to solid foods.  You may eat anything you prefer, but it is better to start with liquids, then soup and crackers, and gradually work up to solid foods.   3) Please notify your doctor immediately if you have any unusual bleeding, trouble breathing, redness and pain at the surgery site, drainage, fever, or pain not relieved by medication.    4) Additional Instructions:    Elevate right arm and move fingers frequently.    Please contact your physician with any problems or Same Day Surgery at [phone], Monday through Friday 6 am to 4 pm, or [place] [facility] number at [phone].

## 2014-08-26 NOTE — H&P (Signed)
THE PATIENT WAS SEEN IN THE HOLDING AREA.  HISTORY, ALLERGIES, HOME MEDICATIONS AND OPERATIVE PROCEDURE WERE REVIEWED. RISKS AND BENEFITS OF SURGERY DISCUSSED WITH PATIENT AGAIN.  NO CHANGES FROM INITIAL HISTORY AND PHYSICAL NOTED.    

## 2014-08-26 NOTE — Transfer of Care (Signed)
Immediate Anesthesia Transfer of Care Note  Patient: Nicole Young [id number]  Procedure(s) Performed: Procedure(s): CARPAL TUNNEL RELEASE (Right)  Patient Location: PACU  Anesthesia Type:General  Level of Consciousness: awake and alert   Airway & Oxygen Therapy: Patient Spontanous Breathing and Patient connected to face mask oxygen  Post-op Assessment: Report given to RN and Post -op Vital signs reviewed and stable  Post vital signs: Reviewed and stable  Last Vitals:  Filed Vitals:   08/26/14 0936  BP: 150/66  Pulse: 79  Temp:   Resp: 16    Complications: No apparent anesthesia complications

## 2014-08-26 NOTE — Anesthesia Preprocedure Evaluation (Addendum)
Anesthesia Evaluation  Patient identified by MRN, date of birth, ID band Patient awake    Reviewed: Allergy & Precautions, H&P , NPO status , Patient's Chart, lab work & pertinent test results, reviewed documented beta blocker date and time   History of Anesthesia Complications (+) PROLONGED EMERGENCE and history of anesthetic complications  Airway Mallampati: III  TM Distance: >3 FB Neck ROM: full    Dental no notable dental hx. (+) Teeth Intact, Caps   Pulmonary neg pulmonary ROS,  breath sounds clear to auscultation  Pulmonary exam normal       Cardiovascular Exercise Tolerance: Good negative cardio ROS Normal cardiovascular examRhythm:regular Rate:Normal     Neuro/Psych PSYCHIATRIC DISORDERS (depression) negative neurological ROS     GI/Hepatic negative GI ROS, Neg liver ROS,   Endo/Other  diabetes, Oral Hypoglycemic AgentsHypothyroidism   Renal/GU negative Renal ROS  negative genitourinary   Musculoskeletal   Abdominal   Peds  Hematology negative hematology ROS (+)   Anesthesia Other Findings Past Medical History:   Thyroid disease                                              Diabetes mellitus without complication                       Depression                                                   Rectocele                                                    Hypothyroidism                                               Osteoarthritis                                               Neuropathy                                                   Complication of anesthesia                                     Comment:slow to wake up in past   Reproductive/Obstetrics negative OB ROS                            Anesthesia Physical Anesthesia Plan  ASA: II  Anesthesia Plan: General   Post-op Pain Management:    Induction:   Airway Management Planned:  Additional Equipment:   Intra-op  Plan:   Post-operative Plan:   Informed Consent: I have reviewed the patients History and Physical, chart, labs and discussed the procedure including the risks, benefits and alternatives for the proposed anesthesia with the patient or authorized representative who has indicated his/her understanding and acceptance.   Dental Advisory Given  Plan Discussed with: Anesthesiologist, CRNA and Surgeon  Anesthesia Plan Comments:        Anesthesia Quick Evaluation

## 2014-08-26 NOTE — Anesthesia Procedure Notes (Signed)
Procedure Name: LMA Insertion Date/Time: 08/26/2014 7:52 AM Performed by: Rosaria Ferries, Megon Kalina Pre-anesthesia Checklist: Patient identified, Emergency Drugs available, Suction available and Patient being monitored Patient Re-evaluated:Patient Re-evaluated prior to inductionOxygen Delivery Method: Circle system utilized Preoxygenation: Pre-oxygenation with 100% oxygen Intubation Type: IV induction LMA Size: 4.0 Dental Injury: Teeth and Oropharynx as per pre-operative assessment

## 2014-08-26 NOTE — Op Note (Signed)
08/26/2014  9:56 AM  PATIENT:  Nicole Young    PRE-OPERATIVE DIAGNOSIS: RIGHT CARPAL TUNNEL SYNDROME POST-OPERATIVE DIAGNOSIS: RIGHT CARPAL TUNNEL SYNDROME  PROCEDURE:  RIGHT CARPAL TUNNEL RELEASE  SURGEON: Park Breed, MD    ANESTHESIA:   General  TOURNIQUET TIME: 17 min  PREOPERATIVE INDICATIONS:  [name] is a  74 y.o. female with a diagnosis of right carpal tunnel syndrome who failed conservative measures and elected for surgical management.    The risks benefits and alternatives were discussed with the patient preoperatively including but not limited to the risks of infection, bleeding, nerve injury, incomplete relief of symptoms, pillar pain, cardiopulmonary complications, the need for revision surgery, among others, and the patient was willing to proceed.  OPERATIVE FINDINGS: Thickened volar ligament and nerve compression.  OPERATIVE PROCEDURE: The patient is brought to the operating room placed in the supine position. General anesthesia was administered. The right upper extremity was prepped and draped in usual sterile fashion. Time out was performed. The arm was elevated and exsanguinated and the tourniquet was inflated. Incision was made in line with the radial border of the ring finger. The carpal tunnel transverse fascia was identified, cleaned, and incised sharply. The common sensory branches were visualized along with the superficial palmar arch and protected.  The median nerve was protected below. Deep retractors were placed underneath the transverse carpal ligament, protecting the nerve. I released the ligament completely, and then released the proximal distal volar forearm fascia. The nerve was identified, and visualized, and protected throughout the case. No masses or abnormalities were identified in ulnar bursa.  The wounds were irrigated copiously, and the wounds injected, and the skin closed with nylon followed by a volar splint and sterile gauze. Sponge  and needle counts were correct.  The patient tolerated this well, with no complications. Awakened and taken to recovery in good condition.

## 2014-08-28 NOTE — Anesthesia Postprocedure Evaluation (Signed)
  Anesthesia Post-op Note  Patient: Nicole Young  Procedure(s) Performed: Procedure(s): CARPAL TUNNEL RELEASE (Right)  Anesthesia type:General  Patient location: PACU  Post pain: Pain level controlled  Post assessment: Post-op Vital signs reviewed, Patient's Cardiovascular Status Stable, Respiratory Function Stable, Patent Airway and No signs of Nausea or vomiting  Post vital signs: Reviewed and stable  Last Vitals:  Filed Vitals:   08/26/14 0936  BP: 150/66  Pulse: 79  Temp:   Resp: 16    Level of consciousness: awake, alert  and patient cooperative  Complications: No apparent anesthesia complications

## 2014-10-23 DIAGNOSIS — Z23 Encounter for immunization: Secondary | ICD-10-CM | POA: Diagnosis not present

## 2014-11-12 DIAGNOSIS — M2550 Pain in unspecified joint: Secondary | ICD-10-CM | POA: Insufficient documentation

## 2014-11-12 DIAGNOSIS — J309 Allergic rhinitis, unspecified: Secondary | ICD-10-CM | POA: Insufficient documentation

## 2014-11-12 DIAGNOSIS — Z9229 Personal history of other drug therapy: Secondary | ICD-10-CM | POA: Insufficient documentation

## 2014-11-12 DIAGNOSIS — E119 Type 2 diabetes mellitus without complications: Secondary | ICD-10-CM | POA: Insufficient documentation

## 2014-11-12 DIAGNOSIS — I10 Essential (primary) hypertension: Secondary | ICD-10-CM | POA: Insufficient documentation

## 2014-11-12 DIAGNOSIS — G2581 Restless legs syndrome: Secondary | ICD-10-CM | POA: Insufficient documentation

## 2014-11-12 DIAGNOSIS — E1159 Type 2 diabetes mellitus with other circulatory complications: Secondary | ICD-10-CM | POA: Insufficient documentation

## 2014-11-12 DIAGNOSIS — E663 Overweight: Secondary | ICD-10-CM | POA: Insufficient documentation

## 2014-11-12 DIAGNOSIS — F320 Major depressive disorder, single episode, mild: Secondary | ICD-10-CM | POA: Insufficient documentation

## 2014-11-12 DIAGNOSIS — E039 Hypothyroidism, unspecified: Secondary | ICD-10-CM | POA: Insufficient documentation

## 2014-11-12 DIAGNOSIS — E785 Hyperlipidemia, unspecified: Secondary | ICD-10-CM | POA: Insufficient documentation

## 2014-11-12 HISTORY — DX: Pain in unspecified joint: M25.50

## 2014-11-17 ENCOUNTER — Ambulatory Visit (INDEPENDENT_AMBULATORY_CARE_PROVIDER_SITE_OTHER): Payer: Medicare Other | Admitting: Family Medicine

## 2014-11-17 VITALS — BP 110/48 | HR 80 | Temp 97.5°F | Resp 16 | Wt 151.0 lb

## 2014-11-17 DIAGNOSIS — E119 Type 2 diabetes mellitus without complications: Secondary | ICD-10-CM | POA: Diagnosis not present

## 2014-11-17 DIAGNOSIS — F329 Major depressive disorder, single episode, unspecified: Secondary | ICD-10-CM

## 2014-11-17 DIAGNOSIS — E785 Hyperlipidemia, unspecified: Secondary | ICD-10-CM

## 2014-11-17 DIAGNOSIS — F32A Depression, unspecified: Secondary | ICD-10-CM

## 2014-11-17 NOTE — Progress Notes (Signed)
Patient ID: Nicole Young, female   DOB: 10-18-1940, 74 y.o.   MRN: [id number]   Nicole Young  MRN: [id number] DOB: December 03, 1940  Subjective:  HPI   1. Hyperlipidemia Patient is a 74 year old female who presents for follow up of her cholesterol.  She was last seen on 05/21/14 and was told to hold her Crestor to see if her myalgias improved.  She did not see any improvement, so she did go back on the Crestor but did not have labs done after re-starting it.  2. Type 2 diabetes mellitus without complication, without long-term current use of insulin ([place]) Followed by Dr. Gabriel Carina.  It is of note that the patient states she has stopped her Januvia and since doing so her myalgias have improved.  3. Depression Patient is also here to follow up on her depression.  She is currently on Sertraline and feels she is doing well.  She states she could probably come off of as far as the depression but she said it helps her anxiety and does not want to make any changes.   Patient Active Problem List   Diagnosis Date Noted  . Arthralgia of multiple joints 11/12/2014  . Allergic rhinitis 11/12/2014  . Diabetes mellitus ([place]) 11/12/2014  . Essential (primary) hypertension 11/12/2014  . Personal history of other drug therapy 11/12/2014  . HLD (hyperlipidemia) 11/12/2014  . Adult hypothyroidism 11/12/2014  . Mild major depression ([place]) 11/12/2014  . Overweight 11/12/2014  . Restless leg 11/12/2014  . Encounter for screening colonoscopy 04/17/2014  . Type 2 diabetes mellitus ([place]) 12/23/2013    Past Medical History  Diagnosis Date  . Thyroid disease   . Diabetes mellitus without complication ([place])   . Depression   . Rectocele   . Hypothyroidism   . Osteoarthritis   . Neuropathy ([place])   . Complication of anesthesia     slow to wake up in past    Social History   Social History  . Marital Status: Widowed    Spouse Name: N/A  . Number of Children: N/A  . Years of Education: N/A    Occupational History  . Not on file.   Social History Main Topics  . Smoking status: Never Smoker   . Smokeless tobacco: Never Used  . Alcohol Use: 0.0 oz/week    0 Standard drinks or equivalent per week     Comment: occasionally  . Drug Use: No  . Sexual Activity: Not on file   Other Topics Concern  . Not on file   Social History Narrative    Outpatient Prescriptions Prior to Visit  Medication Sig Dispense Refill  . ascorbic acid (VITAMIN C) 100 MG tablet Take by mouth.    . Cholecalciferol 400 UNITS CAPS Take by mouth.    . diazepam (VALIUM) 5 MG tablet Take 5 mg by mouth at bedtime as needed for anxiety.    Marland Kitchen glucose blood (ACCU-CHEK SMARTVIEW) test strip ACCU-CHEK SMARTVIEW (In Vitro Strip)  1 (one) Strip Strip check sugar once daily for 0 days  Quantity: 50;  Refills: 12   Ordered :01-Dec-2013  Miguel Aschoff MD;  Buddy Duty 01-Dec-2013 Active Comments: Diagnosis code E11.9    . lansoprazole (PREVACID) 15 MG capsule Take by mouth.    . Magnesium 250 MG TABS Take 1 tablet by mouth daily.    . meloxicam (MOBIC) 15 MG tablet Take by mouth.    . metFORMIN (GLUCOPHAGE-XR) 500 MG 24 hr tablet Take 500 mg by mouth  2 (two) times daily.     . Multiple Vitamin (MULTIVITAMIN) capsule Take 1 capsule by mouth daily.    . rosuvastatin (CRESTOR) 10 MG tablet Take 10 mg by mouth at bedtime.     . sertraline (ZOLOFT) 50 MG tablet Take 50 mg by mouth at bedtime.     Marland Kitchen SYNTHROID 75 MCG tablet TAKE ONE TABLET BY MOUTH ONE TIME DAILY 90 tablet 3  . triamcinolone (NASACORT ALLERGY 24HR) 55 MCG/ACT AERO nasal inhaler Place into the nose.    . Multiple Vitamins-Calcium (VIACTIV MULTI-VITAMIN) CHEW Chew 1 tablet by mouth daily.    . sitaGLIPtin (JANUVIA) 100 MG tablet Take by mouth.     No facility-administered medications prior to visit.    Allergies  Allergen Reactions  . Neomycin-Polymyxin-Gramicidin Other (See Comments)    Other Reaction: Other reaction  . Wilder Glade [Dapagliflozin]  Other (See Comments)    Weak and dizzy   . Neosporin [Neomycin-Bacitracin Zn-Polymyx] Swelling  . Thimerosal Swelling  . Zostavax  [Zoster Vaccine Live] Rash    Do Not Administer Zostavax to Pt!    Review of Systems  Constitutional: Negative.   Eyes: Negative.   Respiratory: Negative.   Cardiovascular: Negative.   Gastrointestinal: Negative.   Genitourinary: Negative.   Skin: Negative.   Neurological: Negative for dizziness and headaches.  Endo/Heme/Allergies: Negative.   Psychiatric/Behavioral: Negative.    Objective:  BP 110/48 mmHg  Pulse 80  Temp(Src) 97.5 F (36.4 C) (Oral)  Resp 16  Wt 151 lb (68.493 kg)  Physical Exam  Constitutional: She is oriented to person, place, and time and well-developed, well-nourished, and in no distress.  HENT:  Head: Normocephalic and atraumatic.  Right Ear: External ear normal.  Left Ear: External ear normal.  Mouth/Throat: Oropharynx is clear and moist.  Neck: Normal range of motion. Neck supple.  Cardiovascular: Normal rate, regular rhythm, normal heart sounds and intact distal pulses.   Pulmonary/Chest: Effort normal and breath sounds normal.  Neurological: She is alert and oriented to person, place, and time. Gait normal.  Skin: Skin is warm and dry.  Psychiatric: Mood, memory, affect and judgment normal.    Assessment and Plan :  Hyperlipidemia - Plan: Lipid Panel With LDL/HDL Ratio  Type 2 diabetes mellitus without complication, without long-term current use of insulin ([facility])  Depression  chronic GERD    I have done the exam and reviewed the above chart and it is accurate to the best of my knowledge.    Miguel Aschoff MD [facility] 11/17/2014 1:47 PM

## 2014-11-19 ENCOUNTER — Telehealth: Payer: Self-pay | Admitting: Family Medicine

## 2014-11-19 NOTE — Telephone Encounter (Signed)
Pt stated that she lost the lab slip that she received at her OV on 11/17/14 and would like to pick up another today if possible. Pt request a call back when it's ready. Thanks TNP

## 2014-11-19 NOTE — Telephone Encounter (Signed)
Pt advised that she does not need to have a lab slip they will have it in their system-aa

## 2014-11-20 DIAGNOSIS — G629 Polyneuropathy, unspecified: Secondary | ICD-10-CM | POA: Diagnosis not present

## 2014-11-20 DIAGNOSIS — E119 Type 2 diabetes mellitus without complications: Secondary | ICD-10-CM | POA: Diagnosis not present

## 2014-11-20 DIAGNOSIS — E785 Hyperlipidemia, unspecified: Secondary | ICD-10-CM | POA: Diagnosis not present

## 2014-11-20 DIAGNOSIS — R202 Paresthesia of skin: Secondary | ICD-10-CM | POA: Diagnosis not present

## 2014-11-21 LAB — LIPID PANEL WITH LDL/HDL RATIO
Cholesterol, Total: 183 mg/dL (ref 100–199)
HDL: 60 mg/dL (ref >39)
LDL CALC: 85 mg/dL (ref 0–99)
LDL/HDL RATIO: 1.4 ratio (ref 0.0–3.2)
TRIGLYCERIDES: 190 mg/dL — AB (ref 0–149)
VLDL CHOLESTEROL CAL: 38 mg/dL (ref 5–40)

## 2014-11-27 DIAGNOSIS — E039 Hypothyroidism, unspecified: Secondary | ICD-10-CM | POA: Diagnosis not present

## 2014-11-27 DIAGNOSIS — G589 Mononeuropathy, unspecified: Secondary | ICD-10-CM | POA: Diagnosis not present

## 2014-11-27 DIAGNOSIS — R232 Flushing: Secondary | ICD-10-CM | POA: Diagnosis not present

## 2014-11-27 DIAGNOSIS — E119 Type 2 diabetes mellitus without complications: Secondary | ICD-10-CM | POA: Diagnosis not present

## 2014-12-11 DIAGNOSIS — J342 Deviated nasal septum: Secondary | ICD-10-CM | POA: Diagnosis not present

## 2014-12-11 DIAGNOSIS — T444X5A Adverse effect of predominantly alpha-adrenoreceptor agonists, initial encounter: Secondary | ICD-10-CM | POA: Diagnosis not present

## 2014-12-11 DIAGNOSIS — R51 Headache: Secondary | ICD-10-CM | POA: Diagnosis not present

## 2014-12-11 DIAGNOSIS — R0981 Nasal congestion: Secondary | ICD-10-CM | POA: Diagnosis not present

## 2014-12-11 DIAGNOSIS — K219 Gastro-esophageal reflux disease without esophagitis: Secondary | ICD-10-CM | POA: Diagnosis not present

## 2014-12-11 DIAGNOSIS — J301 Allergic rhinitis due to pollen: Secondary | ICD-10-CM | POA: Diagnosis not present

## 2014-12-30 DIAGNOSIS — J342 Deviated nasal septum: Secondary | ICD-10-CM | POA: Diagnosis not present

## 2014-12-30 DIAGNOSIS — R51 Headache: Secondary | ICD-10-CM | POA: Diagnosis not present

## 2014-12-30 DIAGNOSIS — J300 Vasomotor rhinitis: Secondary | ICD-10-CM | POA: Diagnosis not present

## 2015-01-10 HISTORY — PX: AUGMENTATION MAMMAPLASTY: SUR837

## 2015-02-11 DIAGNOSIS — H26491 Other secondary cataract, right eye: Secondary | ICD-10-CM | POA: Diagnosis not present

## 2015-03-17 ENCOUNTER — Encounter: Payer: Self-pay | Admitting: *Deleted

## 2015-03-23 DIAGNOSIS — Z01419 Encounter for gynecological examination (general) (routine) without abnormal findings: Secondary | ICD-10-CM | POA: Diagnosis not present

## 2015-03-23 DIAGNOSIS — Z1211 Encounter for screening for malignant neoplasm of colon: Secondary | ICD-10-CM | POA: Diagnosis not present

## 2015-03-25 DIAGNOSIS — Z1231 Encounter for screening mammogram for malignant neoplasm of breast: Secondary | ICD-10-CM | POA: Diagnosis not present

## 2015-04-20 ENCOUNTER — Encounter: Payer: Self-pay | Admitting: Family Medicine

## 2015-04-20 ENCOUNTER — Ambulatory Visit (INDEPENDENT_AMBULATORY_CARE_PROVIDER_SITE_OTHER): Payer: Medicare Other | Admitting: Family Medicine

## 2015-04-20 VITALS — BP 108/54 | HR 73 | Temp 98.1°F | Resp 14

## 2015-04-20 DIAGNOSIS — J029 Acute pharyngitis, unspecified: Secondary | ICD-10-CM | POA: Diagnosis not present

## 2015-04-20 DIAGNOSIS — M2550 Pain in unspecified joint: Secondary | ICD-10-CM

## 2015-04-20 NOTE — Progress Notes (Signed)
Patient ID: Nicole Young, female   DOB: 22-Mar-1940, 75 y.o.   MRN: [id number]   Patient: Nicole Young Female    DOB: [birth year]   75 y.o.   MRN: [id number] Visit Date: 04/20/2015  Today's Provider: Vernie Murders, PA   Chief Complaint  Patient presents with  . Joint Pain   Subjective:    Sore Throat  This is a new problem. Episode onset: per patient a while ago. Has a history of postnasal drip and scratchy throat. Progression since onset: pain worse this past week. The pain is worse on the left side. There has been no fever.   Joint Pain: Patient reports increased joint pain recently. Patient states she has joint pain over entire body which was worse this week while on her trip. Rest, tylenol and aspirin helps relieve the pain somewhat per patient. On a 2 week vacation and walking 2 miles a day got home 2 days ago. Had onset of aching joints the previous Thursday. Has had some help with Meloxicam with some help but no better than Tylenol with ASA. Ice help on back and hips. Steroid injections in hips helps a lot. Dr. Sabra Heck (orthopedist) feels majority of joint issues due to osteoarthritis.  Past Medical History  Diagnosis Date  . Thyroid disease   . Diabetes mellitus without complication ([place])   . Depression   . Rectocele   . Hypothyroidism   . Osteoarthritis   . Neuropathy ([place])   . Complication of anesthesia     slow to wake up in past   Past Surgical History  Procedure Laterality Date  . Tonsilectomy, adenoidectomy, bilateral myringotomy and tubes    . Parathyroidectomy  1992  . Placement of breast implants  1975  . Colonoscopy  2005    Dr Bary Castilla  . Colonoscopy N/A 06/24/2014    Procedure: COLONOSCOPY;  Surgeon: Robert Bellow, MD;  Location: [facility] ENDOSCOPY;  Service: Endoscopy;  Laterality: N/A;  . Carpal tunnel release Right 08/26/2014    Procedure: CARPAL TUNNEL RELEASE;  Surgeon: Earnestine Leys, MD;  Location: [facility] ORS;  Service: Orthopedics;  Laterality: Right;     Family History  Problem Relation Age of Onset  . Cancer Father     prostate  . Prostate cancer Father   . Hypertension Mother   . Heart attack Mother   . Diabetes Paternal Uncle   . Congestive Heart Failure Maternal Grandmother   . Heart attack Maternal Grandfather   . CVA Paternal Grandmother   . Prostate cancer Paternal Grandfather    Previous Medications   ASCORBIC ACID (VITAMIN C) 100 MG TABLET    Take by mouth.   CHOLECALCIFEROL 400 UNITS CAPS    Take by mouth.   DIAZEPAM (VALIUM) 5 MG TABLET    Take 5 mg by mouth at bedtime as needed for anxiety.   GLUCOSE BLOOD (ACCU-CHEK SMARTVIEW) TEST STRIP    ACCU-CHEK SMARTVIEW (In Vitro Strip)  1 (one) Strip Strip check sugar once daily for 0 days  Quantity: 50;  Refills: 12   Ordered :01-Dec-2013  Miguel Aschoff MD;  Buddy Duty [phone] Active Comments: Diagnosis code E11.9   LANSOPRAZOLE (PREVACID) 15 MG CAPSULE    Take by mouth.   MAGNESIUM 250 MG TABS    Take 1 tablet by mouth daily.   MELOXICAM (MOBIC) 15 MG TABLET    Take by mouth.   METFORMIN (GLUCOPHAGE-XR) 500 MG 24 HR TABLET    Take 500 mg by mouth 2 (two) times daily.  MULTIPLE VITAMIN (MULTIVITAMIN) CAPSULE    Take 1 capsule by mouth daily.   ROSUVASTATIN (CRESTOR) 10 MG TABLET    Take 10 mg by mouth at bedtime.    SERTRALINE (ZOLOFT) 50 MG TABLET    Take 50 mg by mouth at bedtime.    SYNTHROID 75 MCG TABLET    TAKE ONE TABLET BY MOUTH ONE TIME DAILY   TRIAMCINOLONE (NASACORT ALLERGY 24HR) 55 MCG/ACT AERO NASAL INHALER    Place into the nose.   Allergies  Allergen Reactions  . Neomycin-Polymyxin-Gramicidin Other (See Comments)    Other Reaction: Other reaction  . Wilder Glade [Dapagliflozin] Other (See Comments)    Weak and dizzy   . Neosporin [Neomycin-Bacitracin Zn-Polymyx] Swelling  . Thimerosal Swelling  . Zostavax  [Zoster Vaccine Live] Rash    Do Not Administer Zostavax to Pt!    Review of Systems  Constitutional: Negative.   HENT: Positive for sore  throat.   Eyes: Negative.   Respiratory: Negative.   Cardiovascular: Negative.   Gastrointestinal: Negative.   Endocrine: Negative.   Musculoskeletal: Positive for arthralgias.  Skin: Negative.   Allergic/Immunologic: Negative.   Neurological: Negative.   Hematological: Negative.   Psychiatric/Behavioral: Negative.     Social History  Substance Use Topics  . Smoking status: Never Smoker   . Smokeless tobacco: Never Used  . Alcohol Use: 0.0 oz/week    0 Standard drinks or equivalent per week     Comment: occasionally   Objective:   BP 108/54 mmHg  Pulse 73  Temp(Src) 98.1 F (36.7 C) (Oral)  Resp 14  Physical Exam  Constitutional: She is oriented to person, place, and time. She appears well-developed and well-nourished. No distress.  HENT:  Head: Normocephalic and atraumatic.  Right Ear: Hearing and external ear normal.  Left Ear: Hearing and external ear normal.  Nose: Nose normal.  Mouth/Throat: Oropharynx is clear and moist.  Eyes: Conjunctivae, EOM and lids are normal. Right eye exhibits no discharge. Left eye exhibits no discharge. No scleral icterus.  Neck: Neck supple.  Cardiovascular: Normal rate and regular rhythm.   Pulmonary/Chest: Effort normal and breath sounds normal. No respiratory distress.  Abdominal: Soft. Bowel sounds are normal.  Musculoskeletal: Normal range of motion. She exhibits tenderness.  Bilateral first carpal-metacarpal joints. Early nodule in the right 4th finger flexor tendon in the palm. Slightly prominent MCP joints of both hands (esp 3rd and 4th joints). Crepitus of both knees with some clicks on the left.  Lymphadenopathy:    She has no cervical adenopathy.  Neurological: She is alert and oriented to person, place, and time.  Skin: Skin is intact. No lesion and no rash noted.  Psychiatric: She has a normal mood and affect. Her speech is normal and behavior is normal. Thought content normal.      Assessment & Plan:     1. Sore  throat Recurrent scratchy throat without fever. Has a history of GERD and treatment started by ENT when her dentist felt stomach acid was affecting her teeth. Prilosec seems to be controlling reflux very well. No significant dyspepsia. Has a history of allergic rhinitis. Recommend antihistamine and continue Nasacort. May gargle with warm saltwater and will check CBC for signs of infection. Recheck pending lab reports. - CBC with Differential/Platelet  2. Arthralgia of multiple joints Frequent joint aches and pains. Recent flare due to walking frequently on a vacation. Tylenol with ASA seems to be helping along with hot soaks. 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 Will check arthritis panel and consider physical therapy for arthritis pain. Recheck pending reports. - Sedimentation rate - CBC with Differential/Platelet - Comprehensive metabolic panel - Rheumatoid Factor - Uric acid

## 2015-04-26 DIAGNOSIS — J029 Acute pharyngitis, unspecified: Secondary | ICD-10-CM | POA: Diagnosis not present

## 2015-04-26 DIAGNOSIS — M2550 Pain in unspecified joint: Secondary | ICD-10-CM | POA: Diagnosis not present

## 2015-04-27 ENCOUNTER — Telehealth: Payer: Self-pay

## 2015-04-27 DIAGNOSIS — M1990 Unspecified osteoarthritis, unspecified site: Secondary | ICD-10-CM

## 2015-04-27 LAB — CBC WITH DIFFERENTIAL/PLATELET
BASOS: 1 %
Basophils Absolute: 0 10*3/uL (ref 0.0–0.2)
EOS (ABSOLUTE): 0.2 10*3/uL (ref 0.0–0.4)
EOS: 2 %
HEMATOCRIT: 38.5 % (ref 34.0–46.6)
Hemoglobin: 13 g/dL (ref 11.1–15.9)
IMMATURE GRANS (ABS): 0 10*3/uL (ref 0.0–0.1)
IMMATURE GRANULOCYTES: 0 %
LYMPHS: 31 %
Lymphocytes Absolute: 2.4 10*3/uL (ref 0.7–3.1)
MCH: 29.6 pg (ref 26.6–33.0)
MCHC: 33.8 g/dL (ref 31.5–35.7)
MCV: 88 fL (ref 79–97)
MONOS ABS: 0.7 10*3/uL (ref 0.1–0.9)
Monocytes: 9 %
NEUTROS ABS: 4.5 10*3/uL (ref 1.4–7.0)
NEUTROS PCT: 57 %
Platelets: 253 10*3/uL (ref 150–379)
RBC: 4.39 x10E6/uL (ref 3.77–5.28)
RDW: 14.9 % (ref 12.3–15.4)
WBC: 7.8 10*3/uL (ref 3.4–10.8)

## 2015-04-27 LAB — RHEUMATOID FACTOR: Rhuematoid fact SerPl-aCnc: <10 IU/mL (ref 0.0–13.9)

## 2015-04-27 LAB — COMPREHENSIVE METABOLIC PANEL
ALT: 22 IU/L (ref 0–32)
AST: 19 IU/L (ref 0–40)
Albumin/Globulin Ratio: 2.4 — ABNORMAL HIGH (ref 1.2–2.2)
Albumin: 4.5 g/dL (ref 3.5–4.8)
Alkaline Phosphatase: 41 IU/L (ref 39–117)
BUN/Creatinine Ratio: 27 (ref 12–28)
BUN: 16 mg/dL (ref 8–27)
Bilirubin Total: <0.2 mg/dL (ref 0.0–1.2)
CALCIUM: 9.2 mg/dL (ref 8.7–10.3)
CO2: 19 mmol/L (ref 18–29)
CREATININE: 0.6 mg/dL (ref 0.57–1.00)
Chloride: 100 mmol/L (ref 96–106)
GFR calc Af Amer: 102 mL/min/{1.73_m2} (ref >59)
GFR, EST NON AFRICAN AMERICAN: 89 mL/min/{1.73_m2} (ref >59)
GLOBULIN, TOTAL: 1.9 g/dL (ref 1.5–4.5)
Glucose: 127 mg/dL — ABNORMAL HIGH (ref 65–99)
Potassium: 4.5 mmol/L (ref 3.5–5.2)
SODIUM: 138 mmol/L (ref 134–144)
Total Protein: 6.4 g/dL (ref 6.0–8.5)

## 2015-04-27 LAB — URIC ACID: URIC ACID: 4.8 mg/dL (ref 2.5–7.1)

## 2015-04-27 LAB — SEDIMENTATION RATE: Sed Rate: 16 mm/hr (ref 0–40)

## 2015-04-27 NOTE — Telephone Encounter (Signed)
-----   Message from Margo Common, [place] sent at 04/26/2015  5:53 PM EDT ----- Preliminary blood tests did not show any infection. Awaiting remainder of test results.

## 2015-04-27 NOTE — Telephone Encounter (Signed)
Patient advised as directed below. 

## 2015-04-27 NOTE — Telephone Encounter (Signed)
-----   Message from Margo Common, [place] sent at 04/27/2015  1:48 PM EDT ----- Blood sugar in better shape. No sign of rheumatoid arthritis, anemia or gout. No sign of bacterial infection. Probably having symptoms from viral illness. If arthritis no better, may need physical therapy referral.

## 2015-04-27 NOTE — Telephone Encounter (Signed)
LMTCB

## 2015-04-28 NOTE — Telephone Encounter (Signed)
Patient advised as directed below. Patient verbalized understanding. Patient prefers to proceed with PT referral. Referral order in chart.

## 2015-05-11 DIAGNOSIS — M1812 Unilateral primary osteoarthritis of first carpometacarpal joint, left hand: Secondary | ICD-10-CM | POA: Diagnosis not present

## 2015-05-13 ENCOUNTER — Ambulatory Visit: Payer: Medicare Other | Attending: Family Medicine | Admitting: Physical Therapy

## 2015-05-13 ENCOUNTER — Encounter: Payer: Self-pay | Admitting: Physical Therapy

## 2015-05-13 DIAGNOSIS — M25512 Pain in left shoulder: Secondary | ICD-10-CM

## 2015-05-13 DIAGNOSIS — M25561 Pain in right knee: Secondary | ICD-10-CM

## 2015-05-13 DIAGNOSIS — M25572 Pain in left ankle and joints of left foot: Secondary | ICD-10-CM | POA: Diagnosis not present

## 2015-05-13 DIAGNOSIS — M545 Low back pain, unspecified: Secondary | ICD-10-CM

## 2015-05-13 DIAGNOSIS — M25511 Pain in right shoulder: Secondary | ICD-10-CM

## 2015-05-13 DIAGNOSIS — M25562 Pain in left knee: Secondary | ICD-10-CM | POA: Diagnosis not present

## 2015-05-13 DIAGNOSIS — M25552 Pain in left hip: Secondary | ICD-10-CM

## 2015-05-13 DIAGNOSIS — M25571 Pain in right ankle and joints of right foot: Secondary | ICD-10-CM

## 2015-05-13 DIAGNOSIS — M25551 Pain in right hip: Secondary | ICD-10-CM | POA: Diagnosis not present

## 2015-05-13 NOTE — Therapy (Signed)
[place] MAIN [facility] SERVICES [address] [place], [place], [address] Phone: [phone]   Fax:  [phone]  Physical Therapy Evaluation  Patient Details  Name: Nicole Young MRN: [id number] Date of Birth: 16-Oct-1940 Referring Provider: Fannie Knee MD; Rosanna Randy (PCP)  Encounter Date: 05/13/2015      PT End of Session - 05/13/15 1449    Visit Number 1   Number of Visits 13   Date for PT Re-Evaluation 06/24/15   Authorization Type gcode 1   Authorization Time Period 10   PT Start Time 1102   PT Stop Time 1150   PT Time Calculation (min) 48 min   Activity Tolerance Patient tolerated treatment well   Behavior During Therapy [facility] for tasks assessed/performed      Past Medical History  Diagnosis Date  . Thyroid disease   . Rectocele   . Hypothyroidism   . Neuropathy ([place])   . Complication of anesthesia     slow to wake up in past  . Depression     controlled  . Diabetes mellitus without complication ([place])     controlled  . Osteoarthritis     knee, ankles, shoulders, back, neck, hands/fingers, wrists;     Past Surgical History  Procedure Laterality Date  . Tonsilectomy, adenoidectomy, bilateral myringotomy and tubes    . Parathyroidectomy  1992  . Placement of breast implants  1975  . Colonoscopy  2005    Dr Bary Castilla  . Colonoscopy N/A 06/24/2014    Procedure: COLONOSCOPY;  Surgeon: Robert Bellow, MD;  Location: [facility] ENDOSCOPY;  Service: Endoscopy;  Laterality: N/A;  . Carpal tunnel release Right 08/26/2014    Procedure: CARPAL TUNNEL RELEASE;  Surgeon: Earnestine Leys, MD;  Location: [facility] ORS;  Service: Orthopedics;  Laterality: Right;    There were no vitals filed for this visit.       Subjective Assessment - 05/13/15 1105    Subjective 75 yo Female reports chronic joint pain throughout body for last few years; She reports that going on a trip a few weeks ago and she reports by the 2-3rd day her joints were  hurting so much that she couldn't get out of bed. She reports getting tests for RA/fibromyalgia which was negative; She has been going to gyms and seeing physical trainers and reports that it makes it worse; She reports that she would go 2x a week; She would do weights and stretches; She reports that it has been a few years since she's been; Patient reports stiffness in the morning and pain throughout the evening;    Pertinent History personal factors affecting rehab: chronicity of pain and severity of pain in multiple joints; OA (progressive condition); history of depression    Limitations Standing   How long can you stand comfortably? 10-15 min; pain with stooping   How long can you walk comfortably? >1 mile but has pain throughout the end of the day;    Diagnostic tests a few years ago had Knees/Hips x-ray'd which were normal; Back X-rays show scoliosis and disc protrusion;    Currently in Pain? Yes   Pain Score 7    Pain Location Neck   Pain Orientation Left;Posterior   Pain Descriptors / Indicators Aching;Sore   Pain Type Chronic pain   Aggravating Factors  standing, stooping,    Pain Relieving Factors ice helps temporarily; meloxicam will help but gets nauseated; aspirin and tylenol does help;    Effect of Pain on Daily Activities  sometimes will push through it; "It makes me not want to do things"            [facility] PT Assessment - 05/13/15 0001    Assessment   Medical Diagnosis arthritis   Referring Provider Fannie Knee MD; Rosanna Randy (PCP)   Onset Date/Surgical Date --  2-3 years   Hand Dominance Right   Next MD Visit Sees Rosanna Randy next week   Prior Therapy Denies any PT in the past; none for this condition;    Precautions   Precautions None   Restrictions   Weight Bearing Restrictions No   Balance Screen   Has the patient fallen in the past 6 months No   Has the patient had a decrease in activity level because of a fear of falling?  No   Is the patient reluctant to leave  their home because of a fear of falling?  No   Home Environment   Additional Comments Lives in single story home; no stairs; mod I for self care ADLs;    Prior Function   Level of Independence Independent;Independent with gait;Independent with transfers   Vocation Retired   Leisure Read, Scientist, research (life sciences), Passenger transport manager; recovering a chair currently;    Cognition   Overall Cognitive Status Within Functional Limits for tasks assessed   Observation/Other Assessments   Observations Patient exhibits good mobility; has increased back pain with repeated flexion particularly when coming back up to neutral   Sensation   Light Touch Appears Intact   Additional Comments has numbness along right lateral knee (been present for a while localized to knee)   Coordination   Gross Motor Movements are Fluid and Coordinated Yes   Fine Motor Movements are Fluid and Coordinated Yes   Posture/Postural Control   Posture Comments demonstrates erect posture;    AROM   Overall AROM Comments BUE and BLE AROM is [facility]   Strength   Overall Strength Comments BUE and BLE gross strength is [facility]   Palpation   Palpation comment denies any tenderness to palpation but does have pain with repeated movement   Transfers   Comments able to transfer sit<>stand without HHA   Ambulation/Gait   Gait Comments ambulates independently on even surface without loss of balance; demonstrates good reciprocal gait pattern   Standardized Balance Assessment   10 Meter Walk 1.0 m/s without AD (community ambulator, low fall risk)   High Level Balance   High Level Balance Comments SLS: 10 sec each LE secure; able to stand tandem eyes closed without loss of balance for 10 seconds securely;                            PT Education - 05/13/15 1449    Education provided Yes   Education Details back stretch; findings; recommendations   Person(s) Educated Patient   Methods Explanation;Verbal cues   Comprehension Verbalized understanding;Returned  demonstration;Verbal cues required             PT Long Term Goals - 05/13/15 1456    PT LONG TERM GOAL #1   Title Patient will be independent in home exercise program to improve strength/mobility for better functional independence with ADLs. by 06/24/15   Time 6   Period Weeks   Status New   PT LONG TERM GOAL #2   Title Patient will be able to manage pain with various techniques in order to perform ADLs and leisure tasks without rest breaks by 06/24/15  Time 6   Period Weeks   Status New   PT LONG TERM GOAL #3   Title Patient will verbalize/demonstrate at least 3 correct posture/body mechanics with lifting and housework for less pain with ADLs by 06/24/15   Time 6   Period Weeks   Status New               Plan - 05/13/15 1450    Clinical Impression Statement 75 yo Female reports chronic joint pain in multiple areas which is exacerbated with movement and exercise. She has had repeated x-rays and doctor visits and has been diagnosed with OA. Patient exhibits pain during MMT with resistance. She reports pain with repeated spinal movement. Patient would benefit from skilled PT intervention to improve joint mobility with aquatic exercise and skilled treatment for education in body mechanics and pain management to increase functional mobility;    Rehab Potential Fair   Clinical Impairments Affecting Rehab Potential positive: good PLOF, negative: age, progressive condition of OA; Patient's clinical presentation is evolving as she has pain in multiple joints and OA can be exacerbated by many co-morbidities and environmental factors;    PT Frequency 2x / week   PT Duration 6 weeks   PT Treatment/Interventions Aquatic Therapy;ADLs/Self Care Home Management;Cryotherapy;Electrical Stimulation;Moist Heat;Traction;Balance training;Therapeutic exercise;Therapeutic activities;Functional mobility training;Ultrasound;Neuromuscular re-education;Manual techniques;Taping;Energy conservation;Dry  needling   PT Next Visit Plan work on body mechanics/TENs   PT Home Exercise Plan initiate next visit;    Consulted and Agree with Plan of Care Patient      Patient will benefit from skilled therapeutic intervention in order to improve the following deficits and impairments:  Decreased endurance, Hypomobility, Decreased activity tolerance, Decreased strength, Pain, Decreased mobility, Improper body mechanics  Visit Diagnosis: Bilateral low back pain without sciatica - Plan: PT plan of care cert/re-cert  Pain in left shoulder - Plan: PT plan of care cert/re-cert  Pain in right shoulder - Plan: PT plan of care cert/re-cert  Pain in left hip - Plan: PT plan of care cert/re-cert  Pain in right hip - Plan: PT plan of care cert/re-cert  Pain in left knee - Plan: PT plan of care cert/re-cert  Pain in right knee - Plan: PT plan of care cert/re-cert  Pain in left ankle and joints of left foot - Plan: PT plan of care cert/re-cert  Pain in right ankle and joints of right foot - Plan: PT plan of care cert/re-cert      G-Codes - [id number]    Functional Assessment Tool Used 10 meter walk, pain levels, posture   Functional Limitation Mobility: Walking and moving around   Mobility: Walking and Moving Around Current Status [id number]) At least 20 percent but less than 40 percent impaired, limited or restricted   Mobility: Walking and Moving Around Goal Status [phone]) At least 1 percent but less than 20 percent impaired, limited or restricted       Problem List Patient Active Problem List   Diagnosis Date Noted  . Arthralgia of multiple joints 11/12/2014  . Allergic rhinitis 11/12/2014  . Diabetes mellitus ([place]) 11/12/2014  . Essential (primary) hypertension 11/12/2014  . Personal history of other drug therapy 11/12/2014  . HLD (hyperlipidemia) 11/12/2014  . Adult hypothyroidism 11/12/2014  . Mild major depression ([place]) 11/12/2014  . Overweight 11/12/2014  . Restless leg 11/12/2014   . Encounter for screening colonoscopy 04/17/2014  . Type 2 diabetes mellitus ([place]) 12/23/2013    Trotter,Margaret PT, DPT 05/13/2015, 4:10 PM  [place] MAIN [facility] SERVICES [place], [place], [address] Phone: [phone]   Fax:  [phone]  Name: Nicole Young MRN: [id number] Date of Birth: 01-Mar-1940

## 2015-05-17 ENCOUNTER — Encounter: Payer: Self-pay | Admitting: Physical Therapy

## 2015-05-17 ENCOUNTER — Ambulatory Visit (INDEPENDENT_AMBULATORY_CARE_PROVIDER_SITE_OTHER): Payer: Medicare Other | Admitting: Family Medicine

## 2015-05-17 ENCOUNTER — Ambulatory Visit: Payer: Medicare Other | Admitting: Physical Therapy

## 2015-05-17 VITALS — BP 104/60 | HR 80 | Temp 97.7°F | Resp 16 | Wt 149.0 lb

## 2015-05-17 DIAGNOSIS — R10814 Left lower quadrant abdominal tenderness: Secondary | ICD-10-CM

## 2015-05-17 DIAGNOSIS — M545 Low back pain, unspecified: Secondary | ICD-10-CM

## 2015-05-17 DIAGNOSIS — M25552 Pain in left hip: Secondary | ICD-10-CM | POA: Diagnosis not present

## 2015-05-17 DIAGNOSIS — R1084 Generalized abdominal pain: Secondary | ICD-10-CM

## 2015-05-17 DIAGNOSIS — M2550 Pain in unspecified joint: Secondary | ICD-10-CM | POA: Diagnosis not present

## 2015-05-17 DIAGNOSIS — M25562 Pain in left knee: Secondary | ICD-10-CM

## 2015-05-17 DIAGNOSIS — E119 Type 2 diabetes mellitus without complications: Secondary | ICD-10-CM | POA: Diagnosis not present

## 2015-05-17 DIAGNOSIS — M25511 Pain in right shoulder: Secondary | ICD-10-CM | POA: Diagnosis not present

## 2015-05-17 DIAGNOSIS — E663 Overweight: Secondary | ICD-10-CM | POA: Diagnosis not present

## 2015-05-17 DIAGNOSIS — E038 Other specified hypothyroidism: Secondary | ICD-10-CM | POA: Diagnosis not present

## 2015-05-17 DIAGNOSIS — E785 Hyperlipidemia, unspecified: Secondary | ICD-10-CM | POA: Diagnosis not present

## 2015-05-17 DIAGNOSIS — M25551 Pain in right hip: Secondary | ICD-10-CM

## 2015-05-17 DIAGNOSIS — M25512 Pain in left shoulder: Secondary | ICD-10-CM | POA: Diagnosis not present

## 2015-05-17 DIAGNOSIS — M25561 Pain in right knee: Secondary | ICD-10-CM

## 2015-05-17 LAB — POCT GLYCOSYLATED HEMOGLOBIN (HGB A1C): Hemoglobin A1C: 6.9

## 2015-05-17 MED ORDER — PANTOPRAZOLE SODIUM 40 MG PO TBEC: 40.0000 mg | DELAYED_RELEASE_TABLET | Freq: Every day | ORAL | Status: DC

## 2015-05-17 NOTE — Therapy (Signed)
[place] MAIN [facility] SERVICES [address] [place], [place], [address] Phone: [phone]   Fax:  [phone]  Physical Therapy Treatment  Patient Details  Name: Nicole Young MRN: [id number] Date of Birth: [birth year] Referring Provider: Fannie Knee MD; Rosanna Randy (PCP)  Encounter Date: 05/17/2015      PT End of Session - 05/17/15 1353    Visit Number 2   Number of Visits 13   Date for PT Re-Evaluation 06/24/15   Authorization Type gcode 2   Authorization Time Period 10   PT Start Time 1348   PT Stop Time 1430   PT Time Calculation (min) 42 min   Activity Tolerance Patient tolerated treatment well   Behavior During Therapy [facility] for tasks assessed/performed      Past Medical History  Diagnosis Date  . Thyroid disease   . Rectocele   . Hypothyroidism   . Neuropathy ([place])   . Complication of anesthesia     slow to wake up in past  . Depression     controlled  . Diabetes mellitus without complication ([place])     controlled  . Osteoarthritis     knee, ankles, shoulders, back, neck, hands/fingers, wrists;     Past Surgical History  Procedure Laterality Date  . Tonsilectomy, adenoidectomy, bilateral myringotomy and tubes    . Parathyroidectomy  1992  . Placement of breast implants  1975  . Colonoscopy  2005    Dr Bary Castilla  . Colonoscopy N/A 06/24/2014    Procedure: COLONOSCOPY;  Surgeon: Robert Bellow, MD;  Location: [facility] ENDOSCOPY;  Service: Endoscopy;  Laterality: N/A;  . Carpal tunnel release Right 08/26/2014    Procedure: CARPAL TUNNEL RELEASE;  Surgeon: Earnestine Leys, MD;  Location: [facility] ORS;  Service: Orthopedics;  Laterality: Right;    There were no vitals filed for this visit.      Subjective Assessment - 05/17/15 1424    Subjective "I am doing pretty good today. The weather is nice which helps." Patient reports chronic low back pain which is present at all times no matter the weather.    Pertinent History  personal factors affecting rehab: chronicity of pain and severity of pain in multiple joints; OA (progressive condition); history of depression    Limitations Standing   How long can you stand comfortably? 10-15 min; pain with stooping   How long can you walk comfortably? >1 mile but has pain throughout the end of the day;    Diagnostic tests a few years ago had Knees/Hips x-ray'd which were normal; Back X-rays show scoliosis and disc protrusion;    Currently in Pain? Yes   Pain Score 5    Pain Location Back   Pain Orientation Lower   Pain Descriptors / Indicators Aching;Sore   Pain Type Chronic pain      TREATMENT: Instructed patient in correct body mechanics with ADLs including: Increasing base of support with squatting down towards floor to reduce knee discomfort with cleaning up floor; Standing closer to sink and supporting stomach on edge of counter with prolonged standing with washing dishes; Educated patient in use of reacher and how that could be helpful to reduce back pain with picking things up off floor or doing laundry; Educated patient in how to use longer handle tools (such as broom) when cleaning up mess from floor; Educated patient in use of towel roll with prolonged sitting for better postural support; Educated patient in importance of moving with standing tasks such as  heel/toe raises, side/side weight shift and pelvic rocks forward/backward to reduce back stiffness with prolonged standing;  See patient instructions; Patient verbalized/demonstrates understanding;  Finished with interferential TENs to low back x15 min in hooklying concurrent with cryotherapy to less back discomfort; Patient reports little to no back pain after treatment session;                            PT Education - 05/17/15 1353    Education provided Yes   Education Details back stretch, posture, body mechanics, TENs   Person(s) Educated Patient   Methods Explanation;Verbal  cues   Comprehension Verbalized understanding;Returned demonstration;Verbal cues required             PT Long Term Goals - 05/13/15 1456    PT LONG TERM GOAL #1   Title Patient will be independent in home exercise program to improve strength/mobility for better functional independence with ADLs. by 06/24/15   Time 6   Period Weeks   Status New   PT LONG TERM GOAL #2   Title Patient will be able to manage pain with various techniques in order to perform ADLs and leisure tasks without rest breaks by 06/24/15   Time 6   Period Weeks   Status New   PT LONG TERM GOAL #3   Title Patient will verbalize/demonstrate at least 3 correct posture/body mechanics with lifting and housework for less pain with ADLs by 06/24/15   Time 6   Period Weeks   Status New               Plan - 05/17/15 1422    Clinical Impression Statement Patient instructed in correct posture/body mechanics for reducing pain with ADLs such as washing dishes, cleaning up the floor etc. She verbalized some good habits that she currently practices, but would benefit from a few changes such as standing closer to the counter, using a towel roll when sitting for better posture etc. Finished with interferential TENs to low back concurrent with cryotherapy; Patient responded well with less pain after treatment session;    Rehab Potential Fair   Clinical Impairments Affecting Rehab Potential positive: good PLOF, negative: age, progressive condition of OA; Patient's clinical presentation is evolving as she has pain in multiple joints and OA can be exacerbated by many co-morbidities and environmental factors;    PT Frequency 2x / week   PT Duration 6 weeks   PT Treatment/Interventions Aquatic Therapy;ADLs/Self Care Home Management;Cryotherapy;Electrical Stimulation;Moist Heat;Traction;Balance training;Therapeutic exercise;Therapeutic activities;Functional mobility training;Ultrasound;Neuromuscular re-education;Manual  techniques;Taping;Energy conservation;Dry needling   PT Next Visit Plan work on body mechanics/TENs   PT Home Exercise Plan initiated- see patient instructions;    Consulted and Agree with Plan of Care Patient      Patient will benefit from skilled therapeutic intervention in order to improve the following deficits and impairments:  Decreased endurance, Hypomobility, Decreased activity tolerance, Decreased strength, Pain, Decreased mobility, Improper body mechanics  Visit Diagnosis: Bilateral low back pain without sciatica  Pain in left hip  Pain in right hip  Pain in left knee  Pain in right knee     Problem List Patient Active Problem List   Diagnosis Date Noted  . Arthralgia of multiple joints 11/12/2014  . Allergic rhinitis 11/12/2014  . Diabetes mellitus ([place]) 11/12/2014  . Essential (primary) hypertension 11/12/2014  . Personal history of other drug therapy 11/12/2014  . HLD (hyperlipidemia) 11/12/2014  . Adult hypothyroidism 11/12/2014  . Mild  major depression ([place]) 11/12/2014  . Overweight 11/12/2014  . Restless leg 11/12/2014  . Encounter for screening colonoscopy 04/17/2014  . Type 2 diabetes mellitus ([place]) 12/23/2013     Trotter,Margaret PT, DPT 05/17/2015, 5:23 PM   [place] MAIN [facility] SERVICES [address] [place], [place], [address] Phone: [phone]   Fax:  [phone]  Name: CARYSSA COMAS MRN: [id number] Date of Birth: 05-05-1940

## 2015-05-17 NOTE — Progress Notes (Addendum)
Patient ID: Nicole Young, female   DOB: 03/19/1940, 75 y.o.   MRN: [id number]    Subjective:  HPI  Patient is here for 6 months follow up.  Hyperlipidemia: She takes Crestor daily,. Lab Results  Component Value Date   CHOL 183 11/20/2014   HDL 60 11/20/2014   LDLCALC 85 11/20/2014   TRIG 190* 11/20/2014   GERD: Patient is on Omeprazole and symptoms are stable.  Abdominal tenderness: patient states for at least months she has had issues with abdominal tenderness, almost daily. Tenderness radiates to different spots in her abdomen different times but 2 main spots are under right breast rib area and LLQ. This pain is not associated with food that she can tell. 2 weeks ago she had a sharp pain develop upper abdomen area on the right side spreading to the left side rib area while she was driving, by the time she got home pain spread all over her abdomen and felt like she had excessive gas. That episode lasted 24 hours. Her brother who is a physician told her to see [place] and discuss getting a CT scan done. She has had colonoscopy 06/25/14-hyperplastic polyps, no abdominal surgeries.  last colonoscopy was 06/24/2014 and revealed hyperplastic polyps. Prior to Admission medications   Medication Sig Start Date End Date Taking? Authorizing Provider  ascorbic acid (VITAMIN C) 100 MG tablet Take by mouth. 06/30/10  Yes Historical Provider, MD  Cholecalciferol 400 UNITS CAPS Take by mouth.   Yes Historical Provider, MD  diazepam (VALIUM) 5 MG tablet Take 5 mg by mouth at bedtime as needed for anxiety.   Yes Historical Provider, MD  glucose blood (ACCU-CHEK SMARTVIEW) test strip ACCU-CHEK SMARTVIEW (In Vitro Strip)  1 (one) Strip Strip check sugar once daily for 0 days  Quantity: 50;  Refills: 12   Ordered :01-Dec-2013  Miguel Aschoff MD;  Started 01-Dec-2013 Active Comments: Diagnosis code E11.9 12/01/13  Yes Historical Provider, MD  Magnesium 250 MG TABS Take 1 tablet by mouth daily.   Yes  Historical Provider, MD  metFORMIN (GLUCOPHAGE-XR) 500 MG 24 hr tablet Take 500 mg by mouth 2 (two) times daily.  03/30/14  Yes Historical Provider, MD  Multiple Vitamin (MULTIVITAMIN) capsule Take 1 capsule by mouth daily.   Yes Historical Provider, MD  omeprazole (PRILOSEC) 40 MG capsule  05/16/15  Yes Historical Provider, MD  rosuvastatin (CRESTOR) 10 MG tablet Take 10 mg by mouth at bedtime.    Yes Historical Provider, MD  sertraline (ZOLOFT) 50 MG tablet Take 50 mg by mouth at bedtime.  02/09/14  Yes Historical Provider, MD  SYNTHROID 75 MCG tablet TAKE ONE TABLET BY MOUTH ONE TIME DAILY 07/27/14  Yes Jerrol Banana., MD    Patient Active Problem List   Diagnosis Date Noted  . Arthralgia of multiple joints 11/12/2014  . Allergic rhinitis 11/12/2014  . Diabetes mellitus ([place]) 11/12/2014  . Essential (primary) hypertension 11/12/2014  . Personal history of other drug therapy 11/12/2014  . HLD (hyperlipidemia) 11/12/2014  . Adult hypothyroidism 11/12/2014  . Mild major depression ([place]) 11/12/2014  . Overweight 11/12/2014  . Restless leg 11/12/2014  . Encounter for screening colonoscopy 04/17/2014  . Type 2 diabetes mellitus ([place]) 12/23/2013    Past Medical History  Diagnosis Date  . Thyroid disease   . Rectocele   . Hypothyroidism   . Neuropathy ([place])   . Complication of anesthesia     slow to wake up in past  . Depression  controlled  . Diabetes mellitus without complication ([place])     controlled  . Osteoarthritis     knee, ankles, shoulders, back, neck, hands/fingers, wrists;     Social History   Social History  . Marital Status: Widowed    Spouse Name: N/A  . Number of Children: N/A  . Years of Education: N/A   Occupational History  . Not on file.   Social History Main Topics  . Smoking status: Never Smoker   . Smokeless tobacco: Never Used  . Alcohol Use: 0.0 oz/week    0 Standard drinks or equivalent per week     Comment: occasionally  . Drug Use: No   . Sexual Activity: Not on file   Other Topics Concern  . Not on file   Social History Narrative    Allergies  Allergen Reactions  . Neomycin-Polymyxin-Gramicidin Other (See Comments)    Other Reaction: Other reaction  . Wilder Glade [Dapagliflozin] Other (See Comments)    Weak and dizzy   . Neosporin [Neomycin-Bacitracin Zn-Polymyx] Swelling  . Thimerosal Swelling  . Zostavax  [Zoster Vaccine Live] Rash    Do Not Administer Zostavax to Pt!    Review of Systems  Constitutional: Negative.   Respiratory: Negative.   Cardiovascular: Negative.   Gastrointestinal: Positive for abdominal pain.  Musculoskeletal: Positive for myalgias.  Psychiatric/Behavioral: Negative.     Immunization History  Administered Date(s) Administered  . Influenza-Unspecified 10/23/2014  . Pneumococcal Conjugate-13 12/16/2013  . Tdap 02/14/2013   Objective:  BP 104/60 mmHg  Pulse 80  Temp(Src) 97.7 F (36.5 C)  Resp 16  Wt 149 lb (67.586 kg)  Physical Exam  Constitutional: She is oriented to person, place, and time and well-developed, well-nourished, and in no distress.  HENT:  Head: Normocephalic and atraumatic.  Right Ear: External ear normal.  Left Ear: External ear normal.  Nose: Nose normal.  Eyes: Conjunctivae are normal. Pupils are equal, round, and reactive to light.  Neck: Normal range of motion. Neck supple.  Cardiovascular: Normal rate, regular rhythm, normal heart sounds and intact distal pulses.   No murmur heard. Pulmonary/Chest: Effort normal and breath sounds normal. No respiratory distress. She has no wheezes.  Abdominal: Soft. There is tenderness (epigastric just left of the midline, RUQ tenderness, LLQ).  Musculoskeletal: She exhibits no edema or tenderness.  Neurological: She is alert and oriented to person, place, and time.  Skin: Skin is warm and dry.  Psychiatric: Mood, memory, affect and judgment normal.    Lab Results  Component Value Date   WBC 7.8 04/26/2015    HGB 14.2 05/04/2014   HCT 38.5 04/26/2015   PLT 253 04/26/2015   GLUCOSE 127* 04/26/2015   CHOL 183 11/20/2014   TRIG 190* 11/20/2014   HDL 60 11/20/2014   LDLCALC 85 11/20/2014   TSH 0.98 12/01/2013   HGBA1C 6.6* 05/21/2014    CMP     Component Value Date/Time   NA 138 04/26/2015 0750   NA 134* 07/28/2014 1007   K 4.5 04/26/2015 0750   CL 100 04/26/2015 0750   CO2 19 04/26/2015 0750   GLUCOSE 127* 04/26/2015 0750   GLUCOSE 162* 07/28/2014 1007   BUN 16 04/26/2015 0750   BUN 21* 07/28/2014 1007   CREATININE 0.60 04/26/2015 0750   CREATININE 0.7 05/04/2014   CALCIUM 9.2 04/26/2015 0750   PROT 6.4 04/26/2015 0750   ALBUMIN 4.5 04/26/2015 0750   AST 19 04/26/2015 0750   ALT 22 04/26/2015 0750   ALKPHOS 41  04/26/2015 0750   BILITOT <0.2 04/26/2015 0750   GFRNONAA 89 04/26/2015 0750   GFRAA 102 04/26/2015 0750    Assessment and Plan :  1. Hyperlipidemia Crestor could be contributing to joint pain.  2. Other specified hypothyroidism Stable on last visit.  3. Overweight  4. Generalized abdominal pain For at least 3 months. Will check US abdomen and pelvic area. Pending results and then may need to proceed with CT scan. Will go ahead and refer to GI also in case this is GI related issue. Patient denies bloating. May need endoscopy also.  - US Abdomen Complete; Future - US Pelvis Complete; Future - US Transvaginal Non-OB; Future  5. Arthralgia Discussed with patient that Crestor may be causing this, patient has stopped Crestor for 1 month and did not seem to help her joint issues but then stated that she just re started Crestor and her joints are good now, advised patient if pain gets worst then she needs to stop Crestor and we will follow.  6. GERD Will switch Omeprazole to Pantoprazole, this could be contributing to symptoms if this is a GI issues.  7. DM type 2 Patient has been seen Dr. Gabriel Carina about every 6 months, patient wants to start been seen here possibly,  she is not sure 100%. A1C checked today. I have done the exam and reviewed the above chart and it is accurate to the best of my knowledge.  Patient was seen and examined by Dr. Eulas Post and note was scribed by Theressa Millard, RMA.  Miguel Aschoff MD [facility] 05/17/2015 11:10 AM

## 2015-05-17 NOTE — Patient Instructions (Signed)
BACK: Squat    When needing to clean something up from floor, Try standing with side stance (feet wide apart) then squatting down just a little for less knee discomfort.   Copyright  VHI. All rights reserved.  Toe / Heel Raise    Gently rock back on heels and raise toes. Then rock forward on toes and raise heels. Repeat sequence __10__ times per session. Do while standing for a period of time;  Copyright  VHI. All rights reserved.  Weight Shift: Two-Leg Side    When standing for long periods of time shift weight side to side for 2-3 minutes to reduce stiffness.   Copyright  VHI. All rights reserved.  Pelvic Anterior / Posterior Tilt / Pelvic Rock    Stand with upper back and buttocks touching wall, feet ____ inches from wall, knees relaxed. A.Anterior tilt: Raise chest and arch back slightly. B.Posterior tilt: Contract abdominals and flatten back. Rock __10__ times per session. Do _2___ sessions per week. Progression: Perform pelvic tilt away from wall.  Copyright  VHI. All rights reserved.  Posture - Sitting    Sit upright, head facing forward. Try using a roll to support lower back. Keep shoulders relaxed, and avoid rounded back. Keep hips level with knees. Avoid crossing legs for long periods.   Copyright  VHI. All rights reserved.  Housework - Sink    Place one foot on ledge of cabinet under sink when standing at sink for prolonged periods. ALSO- lean stomach against edge of sink or table with prolonged standing to reduce back pain;   Copyright  VHI. All rights reserved.

## 2015-05-19 ENCOUNTER — Ambulatory Visit: Payer: Medicare Other | Admitting: Physical Therapy

## 2015-05-19 ENCOUNTER — Encounter: Payer: Self-pay | Admitting: Physical Therapy

## 2015-05-19 DIAGNOSIS — M25562 Pain in left knee: Secondary | ICD-10-CM | POA: Diagnosis not present

## 2015-05-19 DIAGNOSIS — M25511 Pain in right shoulder: Secondary | ICD-10-CM | POA: Diagnosis not present

## 2015-05-19 DIAGNOSIS — M545 Low back pain, unspecified: Secondary | ICD-10-CM

## 2015-05-19 DIAGNOSIS — M25512 Pain in left shoulder: Secondary | ICD-10-CM | POA: Diagnosis not present

## 2015-05-19 DIAGNOSIS — M25552 Pain in left hip: Secondary | ICD-10-CM | POA: Diagnosis not present

## 2015-05-19 DIAGNOSIS — M25551 Pain in right hip: Secondary | ICD-10-CM | POA: Diagnosis not present

## 2015-05-19 NOTE — Patient Instructions (Addendum)
  Copyright  VHI. All rights reserved. Knee to Chest (Flexion)   Pull knee toward chest. Feel stretch in lower back or buttock area. Breathing deeply, Hold __15__ seconds. Repeat with other knee. Repeat _2-3___ times. Do _2-3___ sessions per day.  http://[email or web address].exer.us/225   Copyright  VHI. All rights reserved.   Lower Trunk Rotation Stretch  Lying on back with knees bent, Keeping back flat and feet together, rotate knees side to side slowly and in pain free range of motion.  Hold _2___ seconds. Repeat for 1-2 minutes. Do __1__ sets per session. Do __2-3__ sessions per day.  http://orth.exer.us/122     Piriformis Stretch    Lying on back, with both knees bent, cross right foot over left knee. Pull right knee towards left shoulder with left hand feeling stretch in buttocks.  Hold _15___ seconds. Repeat _2___ times. Do __2__ sessions per day.  http://gt2.exer.us/258   Copyright  VHI. All rights reserved.  Lower Trunk Rotation Stretch    Lying on side, knees bent, hold arms out in front and pull top arm backwards rotating back as if winding up a bow and arrow. Hold for 5 seconds;  Repeat _5___ times per set. Do __2__ sets per session. Do __2__ sessions per day.  http://orth.exer.us/123   Copyright  VHI. All rights reserved.

## 2015-05-19 NOTE — Therapy (Addendum)
[place] MAIN [facility] SERVICES [address] [place], [place], [address] Phone: [phone]   Fax:  [phone]  Physical Therapy Treatment  Patient Details  Name: Nicole Young MRN: [id number] Date of Birth: [birth year] Referring Provider: Fannie Knee MD; Rosanna Randy (PCP)  Encounter Date: 05/19/2015      PT End of Session - 05/19/15 1756    Visit Number 3   Number of Visits 13   Date for PT Re-Evaluation 06/24/15   Authorization Type gcode 3   Authorization Time Period 10   PT Start Time [id number]   PT Stop Time 1432   PT Time Calculation (min) 34 min   Activity Tolerance Patient tolerated treatment well;No increased pain   Behavior During Therapy [facility] for tasks assessed/performed      Past Medical History  Diagnosis Date  . Thyroid disease   . Rectocele   . Hypothyroidism   . Neuropathy ([place])   . Complication of anesthesia     slow to wake up in past  . Depression     controlled  . Diabetes mellitus without complication ([place])     controlled  . Osteoarthritis     knee, ankles, shoulders, back, neck, hands/fingers, wrists;     Past Surgical History  Procedure Laterality Date  . Tonsilectomy, adenoidectomy, bilateral myringotomy and tubes    . Parathyroidectomy  1992  . Placement of breast implants  1975  . Colonoscopy  2005    Dr Bary Castilla  . Colonoscopy N/A 06/24/2014    Procedure: COLONOSCOPY;  Surgeon: Robert Bellow, MD;  [facility] END[facility];  Service: Endoscopy;  Laterality: N/A;  . Carpal tunnel release Right 08/26/2014    Procedure: CARPAL TUNNEL RELEASE;  Surgeon: Earnestine Leys, MD;  Location: [facility] ORS;  Service: Orthopedics;  Laterality: Right;    There were no vitals filed for this visit.      Subjective Assessment - 05/19/15 1408    Subjective Patient reports having no back pain for about 20 minutes after last session; She reports that then her back pain started back. She reports not having severe pain  for last week but isn't sure if that is related to better weather or what.    Pertinent History personal factors affecting rehab: chronicity of pain and severity of pain in multiple joints; OA (progressive condition); history of depression    Limitations Standing   How long can you stand comfortably? 10-15 min; pain with stooping   How long can you walk comfortably? >1 mile but has pain throughout the end of the day;    Diagnostic tests a few years ago had Knees/Hips x-ray'd which were normal; Back X-rays show scoliosis and disc protrusion;    Currently in Pain? Yes   Pain Score 6    Pain Location Back   Pain Orientation Lower   Pain Descriptors / Indicators Aching;Sore   Pain Type Chronic pain        TREATMENT: Patient reports having trouble with reaching toes for tying shoes when sitting due to hip/back tightness and discomfort;  Hooklying: Knee to chest stretch 15 sec hold x2 bilaterally; Piriformis stretch 15 sec hold x2 (modified) bilaterally; Patient required min VCs for correct exercise technique including correct positioning with stretches  Right sidelying, bow and arrow stretch 5 sec hold x5;  Patient able to reach feet in sitting better with less pain following stretches; Advanced HEP- see patient instructions;  PT finished with TENs (premodulated setting) to neck x15 min concurrent  with moist heat in hooklying; Patient reports less pain after treatment session; She seems to respond well to TENs with better mobility and less pain;                           PT Education - 05/19/15 1756    Education provided Yes   Education Details HEP advanced, TENs   Person(s) Educated Patient   Methods Explanation;Verbal cues;Handout   Comprehension Verbalized understanding;Returned demonstration;Verbal cues required             PT Long Term Goals - 05/13/15 1456    PT LONG TERM GOAL #1   Title Patient will be independent in home exercise program to  improve strength/mobility for better functional independence with ADLs. by 06/24/15   Time 6   Period Weeks   Status New   PT LONG TERM GOAL #2   Title Patient will be able to manage pain with various techniques in order to perform ADLs and leisure tasks without rest breaks by 06/24/15   Time 6   Period Weeks   Status New   PT LONG TERM GOAL #3   Title Patient will verbalize/demonstrate at least 3 correct posture/body mechanics with lifting and housework for less pain with ADLs by 06/24/15   Time 6   Period Weeks   Status New               Plan - 05/19/15 1756    Clinical Impression Statement Advanced HEP with instruction in LE stretches and low back stretches to reduce back/hip discomfort. Patient able to demonstrate better flexibility being able to reach to foot in sitting for tying shoes with less back pain after LE stretches. She did require min Vcs for correct positioning to reduce back discomfort. Patient responded well to TENs with less neck pain. She would benefit from additional skilled  PT Intervention to improve postural strength, improve body mechanics and reduce joint pain;    Rehab Potential Fair   Clinical Impairments Affecting Rehab Potential positive: good PLOF, negative: age, progressive condition of OA; Patient's clinical presentation is evolving as she has pain in multiple joints and OA can be exacerbated by many co-morbidities and environmental factors;    PT Frequency 2x / week   PT Duration 6 weeks   PT Treatment/Interventions Aquatic Therapy;ADLs/Self Care Home Management;Cryotherapy;Electrical Stimulation;Moist Heat;Traction;Balance training;Therapeutic exercise;Therapeutic activities;Functional mobility training;Ultrasound;Neuromuscular re-education;Manual techniques;Taping;Energy conservation;Dry needling   PT Next Visit Plan work on body mechanics/TENs   PT Home Exercise Plan advanced, see patient instructions;    Consulted and Agree with Plan of Care  Patient      Patient will benefit from skilled therapeutic intervention in order to improve the following deficits and impairments:  Decreased endurance, Hypomobility, Decreased activity tolerance, Decreased strength, Pain, Decreased mobility, Improper body mechanics  Visit Diagnosis: Bilateral low back pain without sciatica  Pain in left hip  Pain in right hip    Problem List Patient Active Problem List   Diagnosis Date Noted  . Arthralgia of multiple joints 11/12/2014  . Allergic rhinitis 11/12/2014  . Diabetes mellitus ([place]) 11/12/2014  . Essential (primary) hypertension 11/12/2014  . Personal history of other drug therapy 11/12/2014  . HLD (hyperlipidemia) 11/12/2014  . Adult hypothyroidism 11/12/2014  . Mild major depression ([place]) 11/12/2014  . Overweight 11/12/2014  . Restless leg 11/12/2014  . Encounter for screening colonoscopy 04/17/2014  . Type 2 diabetes mellitus ([place]) 12/23/2013    Lacharles Altschuler PT, DPT  05/19/2015, 5:58 PM  [place] MAIN [facility] SERVICES [address] [place], [place], [address] Phone: [phone]   Fax:  [phone]  Name: Nicole Young MRN: [id number] Date of Birth: 06-Oct-1940

## 2015-05-21 ENCOUNTER — Ambulatory Visit: Payer: Medicare Other

## 2015-05-21 ENCOUNTER — Other Ambulatory Visit: Payer: Self-pay

## 2015-05-22 NOTE — Addendum Note (Signed)
Addended by: Miguel Aschoff on: 05/22/2015 08:01 AM   Modules accepted: Miquel Dunn

## 2015-05-24 ENCOUNTER — Ambulatory Visit: Payer: Medicare Other | Admitting: Physical Therapy

## 2015-05-26 ENCOUNTER — Telehealth: Payer: Self-pay

## 2015-05-26 ENCOUNTER — Ambulatory Visit: Payer: Medicare Other | Admitting: Physical Therapy

## 2015-05-26 ENCOUNTER — Encounter: Payer: Self-pay | Admitting: Physical Therapy

## 2015-05-26 DIAGNOSIS — M25512 Pain in left shoulder: Secondary | ICD-10-CM

## 2015-05-26 DIAGNOSIS — M25511 Pain in right shoulder: Secondary | ICD-10-CM | POA: Diagnosis not present

## 2015-05-26 DIAGNOSIS — M25551 Pain in right hip: Secondary | ICD-10-CM | POA: Diagnosis not present

## 2015-05-26 DIAGNOSIS — M545 Low back pain, unspecified: Secondary | ICD-10-CM

## 2015-05-26 DIAGNOSIS — M25562 Pain in left knee: Secondary | ICD-10-CM | POA: Diagnosis not present

## 2015-05-26 DIAGNOSIS — M25552 Pain in left hip: Secondary | ICD-10-CM | POA: Diagnosis not present

## 2015-05-26 NOTE — Therapy (Signed)
[place] MAIN [facility] SERVICES [address] [place], [place], [address] Phone: [phone]   Fax:  [phone]  Physical Therapy Treatment  Patient Details  Name: Nicole Young MRN: [id number] Date of Birth: [birth year] Referring Provider: Fannie Knee MD; Rosanna Randy (PCP)  Encounter Date: 05/26/2015      PT End of Session - 05/26/15 1424    Visit Number 4   Number of Visits 13   Date for PT Re-Evaluation 06/24/15   Authorization Type gcode 4   Authorization Time Period 10   PT Start Time [id number]   PT Stop Time 1350   PT Time Calculation (min) 45 min   Activity Tolerance Patient tolerated treatment well;No increased pain   Behavior During Therapy [facility] for tasks assessed/performed      Past Medical History  Diagnosis Date  . Thyroid disease   . Rectocele   . Hypothyroidism   . Neuropathy ([place])   . Complication of anesthesia     slow to wake up in past  . Depression     controlled  . Diabetes mellitus without complication ([place])     controlled  . Osteoarthritis     knee, ankles, shoulders, back, neck, hands/fingers, wrists;     Past Surgical History  Procedure Laterality Date  . Tonsilectomy, adenoidectomy, bilateral myringotomy and tubes    . Parathyroidectomy  1992  . Placement of breast implants  1975  . Colonoscopy  2005    Dr Bary Castilla  . Colonoscopy N/A 06/24/2014    Procedure: COLONOSCOPY;  Surgeon: Robert Bellow, MD;  Location: [facility] ENDOSCOPY;  Service: Endoscopy;  Laterality: N/A;  . Carpal tunnel release Right 08/26/2014    Procedure: CARPAL TUNNEL RELEASE;  Surgeon: Earnestine Leys, MD;  Location: [facility] ORS;  Service: Orthopedics;  Laterality: Right;    There were no vitals filed for this visit.      Subjective Assessment - 05/26/15 1314    Subjective Patient reports feeling a lot of pain and fatigue over the weekend. She reports taking medication which helped her relax and get a lot of sleep. "It just knocked me  out." She reports having some back/neck pain but minimal;    Pertinent History personal factors affecting rehab: chronicity of pain and severity of pain in multiple joints; OA (progressive condition); history of depression    Limitations Standing   How long can you stand comfortably? 10-15 min; pain with stooping   How long can you walk comfortably? >1 mile but has pain throughout the end of the day;    Diagnostic tests a few years ago had Knees/Hips x-ray'd which were normal; Back X-rays show scoliosis and disc protrusion;    Currently in Pain? Yes   Pain Score 4    Pain Location Back   Pain Orientation Lower   Pain Descriptors / Indicators Aching;Sore   Pain Type Chronic pain   Multiple Pain Sites Yes   Pain Score 4   Pain Location Neck   Pain Orientation Mid;Lower   Pain Descriptors / Indicators Aching;Sore;Tightness   Pain Type Chronic pain        TREATMENT: Warm up on Nustep BUE/BLE level 2 x4 min (unbilled);  Patient reports continued neck discomfort;  Instructed patient in HEP: Seated chin tucks 3 sec hold x10; Seated cervical rotation x10 each direction with cues to avoid painful ROM;  Educated patient in using towel roll behind neck when sleeping for better cervical lordosis; Prior to mechanical traction: Supine: PT performed  gentle cervical distraction 10 sec hold, 5 sec rest x5 min with significant reduction in neck pain; PT performed sub occipital release 20 sec hold x5 reps; PT performed PROM of cervical spine in lateral flexion and rotation to tolerance; Patient reports good results with gentle cervical distraction with less discomfort with rotation;   Following manual therapy: PT applied cervical mechanical traction for better pull, 20# pull, 12 # rest, 20 sec on, 10 sec off x12 min;  She reports no pain after treatment session stating, "I feel a lot better. It just doesn't feel as stiff."                         PT Education - 05/26/15  1424    Education provided Yes   Education Details traction, posture, HEP advanced   Person(s) Educated Patient   Methods Explanation;Verbal cues   Comprehension Verbalized understanding;Returned demonstration;Verbal cues required             PT Long Term Goals - 05/13/15 1456    PT LONG TERM GOAL #1   Title Patient will be independent in home exercise program to improve strength/mobility for better functional independence with ADLs. by 06/24/15   Time 6   Period Weeks   Status New   PT LONG TERM GOAL #2   Title Patient will be able to manage pain with various techniques in order to perform ADLs and leisure tasks without rest breaks by 06/24/15   Time 6   Period Weeks   Status New   PT LONG TERM GOAL #3   Title Patient will verbalize/demonstrate at least 3 correct posture/body mechanics with lifting and housework for less pain with ADLs by 06/24/15   Time 6   Period Weeks   Status New               Plan - 05/26/15 1424    Clinical Impression Statement Patient educated in cervical ROM, stretches to do to help reduce stiffness. She did require min VCs for correct exercise technique. PT performed gentle cervical distraction with good results. Finished with mechanical traction. Patient denies any pain after treatment session. she would benefit from additional skilled PT Intervention to improve joint mobility and reduce pain.    Rehab Potential Fair   Clinical Impairments Affecting Rehab Potential positive: good PLOF, negative: age, progressive condition of OA; Patient's clinical presentation is evolving as she has pain in multiple joints and OA can be exacerbated by many co-morbidities and environmental factors;    PT Frequency 2x / week   PT Duration 6 weeks   PT Treatment/Interventions Aquatic Therapy;ADLs/Self Care Home Management;Cryotherapy;Electrical Stimulation;Moist Heat;Traction;Balance training;Therapeutic exercise;Therapeutic activities;Functional mobility  training;Ultrasound;Neuromuscular re-education;Manual techniques;Taping;Energy conservation;Dry needling   PT Next Visit Plan work on body mechanics/TENs   PT Home Exercise Plan advanced, see patient instructions;    Consulted and Agree with Plan of Care Patient      Patient will benefit from skilled therapeutic intervention in order to improve the following deficits and impairments:  Decreased endurance, Hypomobility, Decreased activity tolerance, Decreased strength, Pain, Decreased mobility, Improper body mechanics  Visit Diagnosis: Bilateral low back pain without sciatica  Pain in left shoulder  Pain in right shoulder     Problem List Patient Active Problem List   Diagnosis Date Noted  . Arthralgia of multiple joints 11/12/2014  . Allergic rhinitis 11/12/2014  . Diabetes mellitus ([place]) 11/12/2014  . Essential (primary) hypertension 11/12/2014  . Personal history of other drug  therapy 11/12/2014  . HLD (hyperlipidemia) 11/12/2014  . Adult hypothyroidism 11/12/2014  . Mild major depression ([place]) 11/12/2014  . Overweight 11/12/2014  . Restless leg 11/12/2014  . Encounter for screening colonoscopy 04/17/2014  . Type 2 diabetes mellitus ([place]) 12/23/2013     Trotter,Margaret PT, DPT 05/26/2015, 2:27 PM  [place] MAIN [facility] SERVICES [address] [place], [place], [address] Phone: [phone]   Fax:  [phone]  Name: Nicole Young MRN: [id number] Date of Birth: Feb 17, 1940

## 2015-05-26 NOTE — Addendum Note (Signed)
Addended by: Arnette Norris on: 05/26/2015 02:07 PM   Modules accepted: Orders

## 2015-05-26 NOTE — Patient Instructions (Addendum)
  Stretch Break - Chin Tuck    Looking straight forward, tuck chin and hold __3__ seconds. Relax and return to starting position. Repeat __10__ times 2-3 times a day  Copyright  VHI. All rights reserved.

## 2015-05-26 NOTE — Telephone Encounter (Signed)
A new order put in for USs due to having to add new diagnoses code-aa

## 2015-05-27 ENCOUNTER — Ambulatory Visit
Admission: RE | Admit: 2015-05-27 | Discharge: 2015-05-27 | Disposition: A | Discharge status: Home / Self Care | Payer: Medicare Other | Source: Ambulatory Visit | Attending: Family Medicine | Admitting: Family Medicine

## 2015-05-27 DIAGNOSIS — R1084 Generalized abdominal pain: Secondary | ICD-10-CM | POA: Insufficient documentation

## 2015-05-27 DIAGNOSIS — K760 Fatty (change of) liver, not elsewhere classified: Secondary | ICD-10-CM | POA: Diagnosis not present

## 2015-05-27 DIAGNOSIS — D259 Leiomyoma of uterus, unspecified: Secondary | ICD-10-CM | POA: Insufficient documentation

## 2015-05-31 ENCOUNTER — Encounter: Payer: Self-pay | Admitting: Physical Therapy

## 2015-05-31 ENCOUNTER — Ambulatory Visit: Payer: Medicare Other | Admitting: Physical Therapy

## 2015-05-31 DIAGNOSIS — M25512 Pain in left shoulder: Secondary | ICD-10-CM | POA: Diagnosis not present

## 2015-05-31 DIAGNOSIS — M25551 Pain in right hip: Secondary | ICD-10-CM | POA: Diagnosis not present

## 2015-05-31 DIAGNOSIS — M25511 Pain in right shoulder: Secondary | ICD-10-CM | POA: Diagnosis not present

## 2015-05-31 DIAGNOSIS — M25552 Pain in left hip: Secondary | ICD-10-CM | POA: Diagnosis not present

## 2015-05-31 DIAGNOSIS — M545 Low back pain, unspecified: Secondary | ICD-10-CM

## 2015-05-31 DIAGNOSIS — M25562 Pain in left knee: Secondary | ICD-10-CM | POA: Diagnosis not present

## 2015-05-31 NOTE — Therapy (Signed)
[place] MAIN [facility] SERVICES [address] [place], [place], [address] Phone: [phone]   Fax:  [phone]  Physical Therapy Treatment  Patient Details  Name: Nicole Young MRN: [id number] Date of Birth: April 05, 1940 Referring Provider: Fannie Knee MD; Rosanna Randy (PCP)  Encounter Date: 05/31/2015      PT End of Session - 05/31/15 1344    Visit Number 5   Number of Visits 13   Date for PT Re-Evaluation 06/24/15   Authorization Type gcode 5   Authorization Time Period 10   PT Start Time 1302   PT Stop Time 1345   PT Time Calculation (min) 43 min   Activity Tolerance Patient tolerated treatment well;No increased pain   Behavior During Therapy [facility] for tasks assessed/performed      Past Medical History  Diagnosis Date  . Thyroid disease   . Rectocele   . Hypothyroidism   . Neuropathy ([place])   . Complication of anesthesia     slow to wake up in past  . Depression     controlled  . Diabetes mellitus without complication ([place])     controlled  . Osteoarthritis     knee, ankles, shoulders, back, neck, hands/fingers, wrists;     Past Surgical History  Procedure Laterality Date  . Tonsilectomy, adenoidectomy, bilateral myringotomy and tubes    . Parathyroidectomy  1992  . Placement of breast implants  1975  . Colonoscopy  2005    Dr Bary Castilla  . Colonoscopy N/A 06/24/2014    Procedure: COLONOSCOPY;  Surgeon: Robert Bellow, MD;  Location: [facility] ENDOSCOPY;  Service: Endoscopy;  Laterality: N/A;  . Carpal tunnel release Right 08/26/2014    Procedure: CARPAL TUNNEL RELEASE;  Surgeon: Earnestine Leys, MD;  Location: [facility] ORS;  Service: Orthopedics;  Laterality: Right;    There were no vitals filed for this visit.      Subjective Assessment - 05/31/15 1304    Subjective "My neck felt really good after last time. I think the traction really helped." Patient reports feeling increased stiffness today and some soreness. She reports  having some houseguests this weekend which made her tired.    Pertinent History personal factors affecting rehab: chronicity of pain and severity of pain in multiple joints; OA (progressive condition); history of depression    Limitations Standing   How long can you stand comfortably? 10-15 min; pain with stooping   How long can you walk comfortably? >1 mile but has pain throughout the end of the day;    Diagnostic tests a few years ago had Knees/Hips x-ray'd which were normal; Back X-rays show scoliosis and disc protrusion;    Currently in Pain? Yes   Pain Score 3    Pain Location Back   Pain Orientation Lower   Pain Descriptors / Indicators Aching;Sore   Pain Type Chronic pain   Pain Score 4   Pain Location Shoulder   Pain Orientation Right   Pain Descriptors / Indicators Sore   Pain Type Chronic pain   Pain Radiating Towards right UE;           TREATMENT: Warm up on UBE level 2 x3 min backwards (unbilled);  Re-educated patient in HEP: Seated chin tucks 3 sec hold x10;  Patient prone; Prior to mechanical traction: PT performed grade II-III PA mobs at T1, T2, T3, T4, T5, T6, T7, T8 10 sec bouts x2 each level; Patient reports slight discomfort along T3-T5 with better flexibility and mobility noted after  joint mobs;  Patient prone: BUE shoulder extension 2# x10; BUE shoulder horizontal abduction 2# x10; Patient required min VCs to increase scapular retraction with BUE movement for better postural strength;   Following manual therapy: PT applied cervical mechanical traction for better pull, 25# pull, 15 # rest, 20 sec on, 10 sec off x10 min;  She reports no pain after treatment session stating, "I feel a lot better. It just doesn't feel as stiff."                              PT Education - 05/31/15 1344    Education provided Yes   Education Details traction, strengthening, posture   Person(s) Educated Patient   Methods Explanation;Verbal cues    Comprehension Verbalized understanding;Returned demonstration;Verbal cues required             PT Long Term Goals - 05/13/15 1456    PT LONG TERM GOAL #1   Title Patient will be independent in home exercise program to improve strength/mobility for better functional independence with ADLs. by 06/24/15   Time 6   Period Weeks   Status New   PT LONG TERM GOAL #2   Title Patient will be able to manage pain with various techniques in order to perform ADLs and leisure tasks without rest breaks by 06/24/15   Time 6   Period Weeks   Status New   PT LONG TERM GOAL #3   Title Patient will verbalize/demonstrate at least 3 correct posture/body mechanics with lifting and housework for less pain with ADLs by 06/24/15   Time 6   Period Weeks   Status New               Plan - 05/31/15 1345    Clinical Impression Statement Patient reports continued back/neck pain; However she responded well to traction last visit and today with less neck pain and better ROM. PT instructed patient in postural strengthening to improve posture support and reduce neck discomfort. She reports no pain after treatment session. she would benefit from additional skilled PT Intervention to improve joint mobility and reduce pain with ADLs.    Rehab Potential Fair   Clinical Impairments Affecting Rehab Potential positive: good PLOF, negative: age, progressive condition of OA; Patient's clinical presentation is evolving as she has pain in multiple joints and OA can be exacerbated by many co-morbidities and environmental factors;    PT Frequency 2x / week   PT Duration 6 weeks   PT Treatment/Interventions Aquatic Therapy;ADLs/Self Care Home Management;Cryotherapy;Electrical Stimulation;Moist Heat;Traction;Balance training;Therapeutic exercise;Therapeutic activities;Functional mobility training;Ultrasound;Neuromuscular re-education;Manual techniques;Taping;Energy conservation;Dry needling   PT Next Visit Plan work on body  mechanics/TENs   PT Home Exercise Plan continue as given;    Consulted and Agree with Plan of Care Patient      Patient will benefit from skilled therapeutic intervention in order to improve the following deficits and impairments:  Decreased endurance, Hypomobility, Decreased activity tolerance, Decreased strength, Pain, Decreased mobility, Improper body mechanics  Visit Diagnosis: Bilateral low back pain without sciatica  Pain in left shoulder  Pain in right shoulder     Problem List Patient Active Problem List   Diagnosis Date Noted  . Arthralgia of multiple joints 11/12/2014  . Allergic rhinitis 11/12/2014  . Diabetes mellitus ([place]) 11/12/2014  . Essential (primary) hypertension 11/12/2014  . Personal history of other drug therapy 11/12/2014  . HLD (hyperlipidemia) 11/12/2014  . Adult hypothyroidism 11/12/2014  .  Mild major depression ([place]) 11/12/2014  . Overweight 11/12/2014  . Restless leg 11/12/2014  . Encounter for screening colonoscopy 04/17/2014  . Type 2 diabetes mellitus ([place]) 12/23/2013    Ashanti Littles PT, DPT 05/31/2015, 1:47 PM  [place] MAIN [facility] SERVICES [address] [place], [place], [address] Phone: [phone]   Fax:  [phone]  Name: DONNETTE SILVERIO MRN: [id number] Date of Birth: [birth year]

## 2015-06-02 ENCOUNTER — Ambulatory Visit: Payer: Medicare Other | Admitting: Physical Therapy

## 2015-06-03 ENCOUNTER — Ambulatory Visit: Payer: Medicare Other | Admitting: Physical Therapy

## 2015-06-03 DIAGNOSIS — M25512 Pain in left shoulder: Secondary | ICD-10-CM | POA: Diagnosis not present

## 2015-06-03 DIAGNOSIS — M25551 Pain in right hip: Secondary | ICD-10-CM | POA: Diagnosis not present

## 2015-06-03 DIAGNOSIS — M25511 Pain in right shoulder: Secondary | ICD-10-CM | POA: Diagnosis not present

## 2015-06-03 DIAGNOSIS — M545 Low back pain, unspecified: Secondary | ICD-10-CM

## 2015-06-03 DIAGNOSIS — M25562 Pain in left knee: Secondary | ICD-10-CM | POA: Diagnosis not present

## 2015-06-03 DIAGNOSIS — M25552 Pain in left hip: Secondary | ICD-10-CM | POA: Diagnosis not present

## 2015-06-03 NOTE — Therapy (Signed)
[place] MAIN [facility] SERVICES [address] [place], [place], [address] Phone: [phone]   Fax:  [phone]  Physical Therapy Treatment  Patient Details  Name: Nicole Young MRN: [id number] Date of Birth: 05/31/1940 Referring Provider: Fannie Knee MD; Rosanna Randy (PCP)  Encounter Date: 06/03/2015      PT End of Session - 06/03/15 0924    Visit Number 6   Number of Visits 13   Date for PT Re-Evaluation 06/24/15   Authorization Type gcode 6   Authorization Time Period 10   PT Start Time 0805   PT Stop Time 0845   PT Time Calculation (min) 40 min   Activity Tolerance Patient tolerated treatment well;No increased pain   Behavior During Therapy [facility] for tasks assessed/performed      Past Medical History  Diagnosis Date  . Thyroid disease   . Rectocele   . Hypothyroidism   . Neuropathy ([place])   . Complication of anesthesia     slow to wake up in past  . Depression     controlled  . Diabetes mellitus without complication ([place])     controlled  . Osteoarthritis     knee, ankles, shoulders, back, neck, hands/fingers, wrists;     Past Surgical History  Procedure Laterality Date  . Tonsilectomy, adenoidectomy, bilateral myringotomy and tubes    . Parathyroidectomy  1992  . Placement of breast implants  1975  . Colonoscopy  2005    Dr Bary Castilla  . Colonoscopy N/A 06/24/2014    Procedure: COLONOSCOPY;  Surgeon: Robert Bellow, MD;  Location: [facility] ENDOSCOPY;  Service: Endoscopy;  Laterality: N/A;  . Carpal tunnel release Right 08/26/2014    Procedure: CARPAL TUNNEL RELEASE;  Surgeon: Earnestine Leys, MD;  Location: [facility] ORS;  Service: Orthopedics;  Laterality: Right;    There were no vitals filed for this visit.      Subjective Assessment - 06/03/15 0920    Subjective Pt reported she would like to keep moving with less pain. Pt stated she gets sea sick easily and forgot to bring her wrist band that helps her with her Sx.    Pertinent History personal factors affecting rehab: chronicity of pain and severity of pain in multiple joints; OA (progressive condition); history of depression    Limitations Standing   How long can you stand comfortably? 10-15 min; pain with stooping   How long can you walk comfortably? >1 mile but has pain throughout the end of the day;    Diagnostic tests a few years ago had Knees/Hips x-ray'd which were normal; Back X-rays show scoliosis and disc protrusion;                      Adult Aquatic Therapy - 06/03/15 0921    Aquatic Therapy Subjective   Subjective See report below        O: Pt entered  the pool via steps with single UE support on rail/ exited via ramp with PT assist due to dizziness (see report)  Exercises performed in 3'6" depth  50 ft =1 lap  2 laps: walking  w/ blue pool floor for proprioception 2 laps: backward walking, cued for shorter stride and more postural stability  Stretches:  Eagle pose both sides and education on rest breaks and breathing coordination  Postural stability: Noodle under arm/ thighs/ seated position, toe touch: pt initially had difficulty stabilizing and required PT assist and cuing.  Pt was able to maintain stability  for 10' with breathing coordination training  Breast stroke arm movements with cues for scapular depression 5'   Noodle under arm: Hip abd/ flexion 10 x  BUE assist on rail: Double knee to chest with a pylometric jump 5 x, breathing coordinated   Pt reported dizziness at 35 min into the session which pt stated was very likely due to feeling sea-sick. Pt was seated on pool bench for 5 min and escorted out of the pool with HHA while pt used the rail with single UE . Pt sat in a chair on land for 5 min and later reported no more dizziness.  BP 120/80 mm Hg. Pt declined asking her family member to drive her home. Pt was provided a cup of water.                PT Long Term Goals - 05/13/15 1456    PT  LONG TERM GOAL #1   Title Patient will be independent in home exercise program to improve strength/mobility for better functional independence with ADLs. by 06/24/15   Time 6   Period Weeks   Status New   PT LONG TERM GOAL #2   Title Patient will be able to manage pain with various techniques in order to perform ADLs and leisure tasks without rest breaks by 06/24/15   Time 6   Period Weeks   Status New   PT LONG TERM GOAL #3   Title Patient will verbalize/demonstrate at least 3 correct posture/body mechanics with lifting and housework for less pain with ADLs by 06/24/15   Time 6   Period Weeks   Status New               Plan - 06/03/15 [id number]    Clinical Impression Statement Exercises were focused on postural stability, gentle aerobic activity, and balance.  Pt demo'd proper breathing coordination with tactile and verbal cues to avoid breathholding.  Pt reported dizziness due to sea-sickness at 35 min into the session and pt was seated on pool bench for 5 min and escorted out of the pool with HHA while pt used the rail with single UE . Pt sat in a chair on land for 5 min and later reported no more dizziness.  Pt's BP was 120/80 after a seated rest break of 5 min on land and showed no LOB when walking. Pt plans to wear her wrist band to next session and she states that will help her with her sea-sickness Sx. Pt would benefit from another pool sesion to continue learning how to perform pool exercises. PT phoned pt after session and pt reported arriving home safely and states she feels her dizziness is due to sea-sickness which she has had before.    Rehab Potential Fair   Clinical Impairments Affecting Rehab Potential positive: good PLOF, negative: age, progressive condition of OA; Patient's clinical presentation is evolving as she has pain in multiple joints and OA can be exacerbated by many co-morbidities and environmental factors;    PT Frequency 2x / week   PT Duration 6 weeks   PT  Treatment/Interventions Aquatic Therapy;ADLs/Self Care Home Management;Cryotherapy;Electrical Stimulation;Moist Heat;Traction;Balance training;Therapeutic exercise;Therapeutic activities;Functional mobility training;Ultrasound;Neuromuscular re-education;Manual techniques;Taping;Energy conservation;Dry needling   PT Next Visit Plan work on body mechanics/TENs   PT Home Exercise Plan continue as given;    Consulted and Agree with Plan of Care Patient      Patient will benefit from skilled therapeutic intervention in order to improve the following deficits and impairments:  Decreased endurance, Hypomobility, Decreased activity tolerance, Decreased strength, Pain, Decreased mobility, Improper body mechanics  Visit Diagnosis: Bilateral low back pain without sciatica  Pain in left shoulder  Pain in right shoulder     Problem List Patient Active Problem List   Diagnosis Date Noted  . Arthralgia of multiple joints 11/12/2014  . Allergic rhinitis 11/12/2014  . Diabetes mellitus ([place]) 11/12/2014  . Essential (primary) hypertension 11/12/2014  . Personal history of other drug therapy 11/12/2014  . HLD (hyperlipidemia) 11/12/2014  . Adult hypothyroidism 11/12/2014  . Mild major depression ([place]) 11/12/2014  . Overweight 11/12/2014  . Restless leg 11/12/2014  . Encounter for screening colonoscopy 04/17/2014  . Type 2 diabetes mellitus ([place]) 12/23/2013    Jerl Mina ,PT, DPT, E-RYT  06/03/2015, 9:41 AM  [place] MAIN [facility] SERVICES [address] [place], [place], [address] Phone: [phone]   Fax:  [phone]  Name: IOLANDA LENNERT MRN: [id number] Date of Birth: 12-12-1940

## 2015-06-08 ENCOUNTER — Ambulatory Visit: Payer: Medicare Other | Admitting: Physical Therapy

## 2015-06-09 ENCOUNTER — Encounter: Payer: Self-pay | Admitting: Physical Therapy

## 2015-06-09 ENCOUNTER — Telehealth: Payer: Self-pay | Admitting: Physical Therapy

## 2015-06-09 NOTE — Telephone Encounter (Signed)
PT called pt to check up on her cancelled appt. Pt stated she had felt sick yesterday but she feels better today. Pt stated she would not be interested in continuing pool therapy because she felt sea-sick the rest of the day following last week's session.  Pt inquired about community yoga classes and PT provided recommendations for gentle yoga classes and DVD resources produced by yoga and physical therapists. PT stated she will communicate with her therapist about her decision.

## 2015-06-11 ENCOUNTER — Ambulatory Visit: Payer: Medicare Other | Attending: Family Medicine | Admitting: Physical Therapy

## 2015-06-11 ENCOUNTER — Encounter: Payer: Self-pay | Admitting: Physical Therapy

## 2015-06-11 DIAGNOSIS — M25511 Pain in right shoulder: Secondary | ICD-10-CM | POA: Insufficient documentation

## 2015-06-11 DIAGNOSIS — M25572 Pain in left ankle and joints of left foot: Secondary | ICD-10-CM | POA: Diagnosis not present

## 2015-06-11 DIAGNOSIS — M545 Low back pain, unspecified: Secondary | ICD-10-CM

## 2015-06-11 DIAGNOSIS — M25552 Pain in left hip: Secondary | ICD-10-CM | POA: Insufficient documentation

## 2015-06-11 DIAGNOSIS — M25571 Pain in right ankle and joints of right foot: Secondary | ICD-10-CM | POA: Diagnosis not present

## 2015-06-11 DIAGNOSIS — M25551 Pain in right hip: Secondary | ICD-10-CM

## 2015-06-11 NOTE — Patient Instructions (Addendum)
  Spinal Mobility (Cat / Camel): Flexion / Extension    Get ON TARGET. Hands and knees on bed. 1.Cat: Buttocks up, arch spine segmentally, bottom to top: lift chest as head moves back, look up. 2.Camel: Reverse movement. Close eyes, lower head, tuck chin, compress chest and abdomen, round back. Hold __5_ seconds. Repeat _5__ times. Do _2__ sessions per day. FOCUS ON MOVING LOW BACK WITH ARCH FOR BETTER LOWER BACK STRETCH  Copyright  VHI. All rights reserved.  Lumbar Side-Bend: with Flexion Stretch (Kneeling)    With hands to right and feet to right, sit back on heels to feel stretch on left side of low back. Hold for 10 seconds. Repeat on other side as needed. Repeat __2__ times per set. Do _2___ sets per session. Do 2____ sessions per day.  http://orth.exer.us/261   Copyright  VHI. All rights reserved.  Child Pose    Sitting on knees, fold body over legs and relax head and arms on floor. Hold for __10 seconds repeat 2-3 times;  http://yg.exer.us/127   Copyright  VHI. All rights reserved.

## 2015-06-11 NOTE — Therapy (Signed)
[place] MAIN  [facility] SERVICES [address] [place], [place], [address] Phone: [phone]   Fax:  [phone]  Physical Therapy Treatment  Patient Details  Name: Nicole Young MRN: [id number] Date of Birth: 09-22-1940 Referring Provider: Fannie Knee MD; Rosanna Randy (PCP)  Encounter Date: 06/11/2015      PT End of Session - 06/11/15 0943    Visit Number 7   Number of Visits 13   Date for PT Re-Evaluation 06/24/15   Authorization Type gcode 7   Authorization Time Period 10   PT Start Time 0900   PT Stop Time 0945   PT Time Calculation (min) 45 min   Activity Tolerance Patient tolerated treatment well;No increased pain   Behavior During Therapy [facility] for tasks assessed/performed      Past Medical History  Diagnosis Date  . Thyroid disease   . Rectocele   . Hypothyroidism   . Neuropathy ([place])   . Complication of anesthesia     slow to wake up in past  . Depression     controlled  . Diabetes mellitus without complication ([place])     controlled  . Osteoarthritis     knee, ankles, shoulders, back, neck, hands/fingers, wrists;     Past Surgical History  Procedure Laterality Date  . Tonsilectomy, adenoidectomy, bilateral myringotomy and tubes    . Parathyroidectomy  1992  . Placement of breast implants  1975  . Colonoscopy  2005    Dr Bary Castilla  . Colonoscopy N/A 06/24/2014    Procedure: COLONOSCOPY;  Surgeon: Robert Bellow, MD;  Location: [facility] ENDOSCOPY;  Service: Endoscopy;  Laterality: N/A;  . Carpal tunnel release Right 08/26/2014    Procedure: CARPAL TUNNEL RELEASE;  Surgeon: Earnestine Leys, MD;  Location: [facility] ORS;  Service: Orthopedics;  Laterality: Right;    There were no vitals filed for this visit.      Subjective Assessment - 06/11/15 0906    Subjective Patien reports doing well today; she reports increased neck pain this AM but reports that it has loosened up some. She reports that she should get her TENs unit any  day now.    Pertinent History personal factors affecting rehab: chronicity of pain and severity of pain in multiple joints; OA (progressive condition); history of depression    Limitations Standing   How long can you stand comfortably? 10-15 min; pain with stooping   How long can you walk comfortably? >1 mile but has pain throughout the end of the day;    Diagnostic tests a few years ago had Knees/Hips x-ray'd which were normal; Back X-rays show scoliosis and disc protrusion;    Currently in Pain? Yes   Pain Score 7    Pain Location Neck   Pain Orientation Mid;Lateral   Pain Descriptors / Indicators Sore   Pain Type Chronic pain   Pain Radiating Towards hurts with movement;         TREATMENT: Patient prone; Prior to mechanical traction: PT performed grade II-III PA mobs at T1, T2, T3, T4, T5, T6, T7, T8 10 sec bouts x2 each level; Patient reports slight discomfort along T3-T5 with better flexibility and mobility noted after joint mobs;  Qped: Cat/camel stretch 10 sec hold x5 each with cues to increase lumbopelvic movement; Prayer stretch 10 sec hold in central, left/right x2 each; Patient required min VCs for correct positioning with stretch to improve tissue extensibility;  Sidelying: Rotational stretch 10 sec hold x4 each side; Clamshells with green tband  x10 bilaterally; Patient required min VCs to avoid painful ROM and to avoid twisting with clamshell for better hip strengthening;   Following manual therapy: PT applied interferential TENs to cervical paraspinals at tolerated intensity x15 min concurrent with moist heat to cervical and lumbar spine in hooklying position;  She reports no pain after treatment session stating, "I feel a lot better. It just doesn't feel as stiff."                           PT Education - 06/11/15 0942    Education provided Yes   Education Details TENs, stretches, strengthening   Person(s) Educated Patient   Methods  Explanation;Verbal cues   Comprehension Verbalized understanding;Returned demonstration;Verbal cues required             PT Long Term Goals - 05/13/15 1456    PT LONG TERM GOAL #1   Title Patient will be independent in home exercise program to improve strength/mobility for better functional independence with ADLs. by 06/24/15   Time 6   Period Weeks   Status New   PT LONG TERM GOAL #2   Title Patient will be able to manage pain with various techniques in order to perform ADLs and leisure tasks without rest breaks by 06/24/15   Time 6   Period Weeks   Status New   PT LONG TERM GOAL #3   Title Patient will verbalize/demonstrate at least 3 correct posture/body mechanics with lifting and housework for less pain with ADLs by 06/24/15   Time 6   Period Weeks   Status New               Plan - 06/11/15 0943    Clinical Impression Statement Patient responds well to thoracolumbar stretches with less tightness. She does continue to require min VCs for correct positioning to improve tissue extensibility; She continues to respond well to TENs with less cervical discomfort. Patient would benefit from additional skilled PT Intervention to improve joint mobility and reduce pain with ADLs.    Rehab Potential Fair   Clinical Impairments Affecting Rehab Potential positive: good PLOF, negative: age, progressive condition of OA; Patient's clinical presentation is evolving as she has pain in multiple joints and OA can be exacerbated by many co-morbidities and environmental factors;    PT Frequency 2x / week   PT Duration 6 weeks   PT Treatment/Interventions Aquatic Therapy;ADLs/Self Care Home Management;Cryotherapy;Electrical Stimulation;Moist Heat;Traction;Balance training;Therapeutic exercise;Therapeutic activities;Functional mobility training;Ultrasound;Neuromuscular re-education;Manual techniques;Taping;Energy conservation;Dry needling   PT Next Visit Plan work on body mechanics/TENs   PT  Home Exercise Plan continue as given;    Consulted and Agree with Plan of Care Patient      Patient will benefit from skilled therapeutic intervention in order to improve the following deficits and impairments:  Decreased endurance, Hypomobility, Decreased activity tolerance, Decreased strength, Pain, Decreased mobility, Improper body mechanics  Visit Diagnosis: Bilateral low back pain without sciatica  Pain in right shoulder  Pain in left hip  Pain in right hip     Problem List Patient Active Problem List   Diagnosis Date Noted  . Arthralgia of multiple joints 11/12/2014  . Allergic rhinitis 11/12/2014  . Diabetes mellitus ([place]) 11/12/2014  . Essential (primary) hypertension 11/12/2014  . Personal history of other drug therapy 11/12/2014  . HLD (hyperlipidemia) 11/12/2014  . Adult hypothyroidism 11/12/2014  . Mild major depression ([place]) 11/12/2014  . Overweight 11/12/2014  . Restless leg 11/12/2014  .  Encounter for screening colonoscopy 04/17/2014  . Type 2 diabetes mellitus ([place]) 12/23/2013    Trotter,Margaret PT, DPT 06/11/2015, 9:44 AM  [place] MAIN [facility] SERVICES [address] [place], [place], [address] Phone: [phone]   Fax:  [phone]  Name: Nicole Young MRN: [id number] Date of Birth: [birth year]

## 2015-06-14 ENCOUNTER — Ambulatory Visit: Payer: Self-pay | Admitting: Family Medicine

## 2015-06-14 ENCOUNTER — Ambulatory Visit: Payer: Medicare Other | Admitting: Physical Therapy

## 2015-06-14 DIAGNOSIS — K219 Gastro-esophageal reflux disease without esophagitis: Secondary | ICD-10-CM | POA: Diagnosis not present

## 2015-06-14 DIAGNOSIS — H6060 Unspecified chronic otitis externa, unspecified ear: Secondary | ICD-10-CM | POA: Diagnosis not present

## 2015-06-14 DIAGNOSIS — R51 Headache: Secondary | ICD-10-CM | POA: Diagnosis not present

## 2015-06-14 DIAGNOSIS — J301 Allergic rhinitis due to pollen: Secondary | ICD-10-CM | POA: Diagnosis not present

## 2015-06-16 ENCOUNTER — Encounter: Payer: Self-pay | Admitting: Physical Therapy

## 2015-06-16 ENCOUNTER — Ambulatory Visit: Payer: Medicare Other | Admitting: Physical Therapy

## 2015-06-16 DIAGNOSIS — E039 Hypothyroidism, unspecified: Secondary | ICD-10-CM | POA: Diagnosis not present

## 2015-06-16 DIAGNOSIS — M25571 Pain in right ankle and joints of right foot: Secondary | ICD-10-CM

## 2015-06-16 DIAGNOSIS — M25552 Pain in left hip: Secondary | ICD-10-CM | POA: Diagnosis not present

## 2015-06-16 DIAGNOSIS — M25511 Pain in right shoulder: Secondary | ICD-10-CM | POA: Diagnosis not present

## 2015-06-16 DIAGNOSIS — M25572 Pain in left ankle and joints of left foot: Secondary | ICD-10-CM

## 2015-06-16 DIAGNOSIS — M545 Low back pain, unspecified: Secondary | ICD-10-CM

## 2015-06-16 DIAGNOSIS — E119 Type 2 diabetes mellitus without complications: Secondary | ICD-10-CM | POA: Diagnosis not present

## 2015-06-16 DIAGNOSIS — M25551 Pain in right hip: Secondary | ICD-10-CM | POA: Diagnosis not present

## 2015-06-16 NOTE — Therapy (Signed)
[place] MAIN [facility] SERVICES [address] [place], [place], [address] Phone: [phone]   Fax:  [phone]  Physical Therapy Treatment/Discharge Summary;  Patient Details  Name: Nicole Young MRN: [id number] Date of Birth: Jun 09, 1940 Referring Provider: Fannie Knee MD; Rosanna Randy (PCP)  Encounter Date: 06/16/2015      PT End of Session - 06/16/15 1125    Visit Number 8   Number of Visits 13   Date for PT Re-Evaluation 06/24/15   Authorization Type gcode 8   Authorization Time Period 10   PT Start Time 1045   PT Stop Time 1115   PT Time Calculation (min) 30 min   Activity Tolerance Patient tolerated treatment well;No increased pain   Behavior During Therapy [facility] for tasks assessed/performed      Past Medical History  Diagnosis Date  . Thyroid disease   . Rectocele   . Hypothyroidism   . Neuropathy ([place])   . Complication of anesthesia     slow to wake up in past  . Depression     controlled  . Diabetes mellitus without complication ([place])     controlled  . Osteoarthritis     knee, ankles, shoulders, back, neck, hands/fingers, wrists;     Past Surgical History  Procedure Laterality Date  . Tonsilectomy, adenoidectomy, bilateral myringotomy and tubes    . Parathyroidectomy  1992  . Placement of breast implants  1975  . Colonoscopy  2005    Dr Bary Castilla  . Colonoscopy N/A 06/24/2014    Procedure: COLONOSCOPY;  Surgeon: Robert Bellow, MD;  Location: [facility] ENDOSCOPY;  Service: Endoscopy;  Laterality: N/A;  . Carpal tunnel release Right 08/26/2014    Procedure: CARPAL TUNNEL RELEASE;  Surgeon: Earnestine Leys, MD;  Location: [facility] ORS;  Service: Orthopedics;  Laterality: Right;    There were no vitals filed for this visit.      Subjective Assessment - 06/16/15 1049    Subjective (p) She reports having  a headache today and increased neck/back pain today; Overall however, she is doing okay; Denies any new changes; Presents  to therapy with home TENs unit;   Pertinent History (p) personal factors affecting rehab: chronicity of pain and severity of pain in multiple joints; OA (progressive condition); history of depression    Limitations (p) Standing   How long can you stand comfortably? (p) 10-15 min; pain with stooping   How long can you walk comfortably? (p) >1 mile but has pain throughout the end of the day;    Diagnostic tests (p) a few years ago had Knees/Hips x-ray'd which were normal; Back X-rays show scoliosis and disc protrusion;    Currently in Pain? (p) Yes   Pain Score (p) 6    Pain Location (p) Back   Pain Orientation (p) Lower;Left   Pain Descriptors / Indicators (p) Aching;Sore   Pain Type (p) Chronic pain       TREATMENT: Instructed patient in safe TENs use and application; Patient independent in don/doff TENs unit on cervical spine; Educated patient in safe settings and how to use timer; She was independent in safe use and removal of home TENs unit;  Patient re-educated on HEP; she is independent in stretches and verbalizes understanding and independence in body mechanics;  She is appropriate for discharge at this time;                           PT Education -  07-08-15 1115    Education provided Yes   Education Details TENs home use, HEP reinforced;   Person(s) Educated Patient   Methods Explanation;Verbal cues   Comprehension Verbalized understanding;Returned demonstration;Verbal cues required             PT Long Term Goals - 2015/07/08 1128    PT LONG TERM GOAL #1   Title Patient will be independent in home exercise program to improve strength/mobility for better functional independence with ADLs. by 06/24/15   Time 6   Period Weeks   Status Achieved   PT LONG TERM GOAL #2   Title Patient will be able to manage pain with various techniques in order to perform ADLs and leisure tasks without rest breaks by 06/24/15   Time 6   Period Weeks   Status  Achieved   PT LONG TERM GOAL #3   Title Patient will verbalize/demonstrate at least 3 correct posture/body mechanics with lifting and housework for less pain with ADLs by 06/24/15   Time 6   Period Weeks   Status Achieved               Plan - 08-Jul-2015 1126    Clinical Impression Statement Instructed patient in safe home TENs unit use with application to cervical spine. Patient required cues for correct electrode placement and correct setting; Also educated patient in importance of HEP compliance and to start doing a walking program prior to trips for less joint discomfort. Patient is independent in HEP; she has met all goals and is appropriate for discharge at this time;    Rehab Potential Fair   Clinical Impairments Affecting Rehab Potential positive: good PLOF, negative: age, progressive condition of OA; Patient's clinical presentation is evolving as she has pain in multiple joints and OA can be exacerbated by many co-morbidities and environmental factors;    PT Frequency 2x / week   PT Duration 6 weeks   PT Treatment/Interventions Aquatic Therapy;ADLs/Self Care Home Management;Cryotherapy;Electrical Stimulation;Moist Heat;Traction;Balance training;Therapeutic exercise;Therapeutic activities;Functional mobility training;Ultrasound;Neuromuscular re-education;Manual techniques;Taping;Energy conservation;Dry needling   PT Next Visit Plan discharge from [place] continue as given;    Consulted and Agree with Plan of Care Patient      Patient will benefit from skilled therapeutic intervention in order to improve the following deficits and impairments:  Decreased endurance, Hypomobility, Decreased activity tolerance, Decreased strength, Pain, Decreased mobility, Improper body mechanics  Visit Diagnosis: Bilateral low back pain without sciatica  Pain in right shoulder  Pain in left ankle and joints of left foot  Pain in right ankle and joints of right foot        G-Codes - July 08, 2015 1129    Functional Assessment Tool Used clinical judgement, pain levels, strength and mobility;    Functional Limitation Mobility: Walking and moving around   Mobility: Walking and Moving Around Goal Status [phone]) At least 1 percent but less than 20 percent impaired, limited or restricted   Mobility: Walking and Moving Around Discharge Status [phone]) At least 1 percent but less than 20 percent impaired, limited or restricted      Problem List Patient Active Problem List   Diagnosis Date Noted  . Arthralgia of multiple joints 11/12/2014  . Allergic rhinitis 11/12/2014  . Diabetes mellitus ([place]) 11/12/2014  . Essential (primary) hypertension 11/12/2014  . Personal history of other drug therapy 11/12/2014  . HLD (hyperlipidemia) 11/12/2014  . Adult hypothyroidism 11/12/2014  . Mild major depression ([place]) 11/12/2014  . Overweight 11/12/2014  .  Restless leg 11/12/2014  . Encounter for screening colonoscopy 04/17/2014  . Type 2 diabetes mellitus ([place]) 12/23/2013    Teniyah Seivert PT, DPT 06/16/2015, 3:05 PM  [place] MAIN [facility] SERVICES [address] [place], [place], [address] Phone: [phone]   Fax:  [phone]  Name: AISLING EMIGH MRN: [id number] Date of Birth: Apr 26, 1940

## 2015-06-17 ENCOUNTER — Ambulatory Visit (INDEPENDENT_AMBULATORY_CARE_PROVIDER_SITE_OTHER): Payer: Medicare Other | Admitting: Family Medicine

## 2015-06-17 ENCOUNTER — Encounter: Payer: Self-pay | Admitting: Family Medicine

## 2015-06-17 ENCOUNTER — Other Ambulatory Visit: Payer: Self-pay | Admitting: Family Medicine

## 2015-06-17 VITALS — BP 124/56 | HR 78 | Temp 97.4°F | Resp 16 | Wt 148.0 lb

## 2015-06-17 DIAGNOSIS — M2550 Pain in unspecified joint: Secondary | ICD-10-CM | POA: Diagnosis not present

## 2015-06-17 DIAGNOSIS — Z9882 Breast implant status: Secondary | ICD-10-CM

## 2015-06-17 DIAGNOSIS — E785 Hyperlipidemia, unspecified: Secondary | ICD-10-CM | POA: Diagnosis not present

## 2015-06-17 DIAGNOSIS — R1011 Right upper quadrant pain: Secondary | ICD-10-CM

## 2015-06-17 DIAGNOSIS — R1084 Generalized abdominal pain: Secondary | ICD-10-CM | POA: Diagnosis not present

## 2015-06-17 MED ORDER — ROSUVASTATIN CALCIUM 10 MG PO TABS: 10.0000 mg | ORAL_TABLET | Freq: Every day | ORAL | Status: DC

## 2015-06-17 NOTE — Progress Notes (Signed)
Patient ID: Nicole Young, female   DOB: February 15, 1940, 75 y.o.   MRN: [id number]    Subjective:  HPI Abdominal pain- Pt is here for a 1 month follow up from abdominal pain. U/S abd/pelvis, normal except for uterine fibroids refer to GI. She was also switched from Omeprazole to Pantoprazole. She reports that her abdominal pain is a little better but she is scheduled to GI early next week.  Arthralgia- she reports that she is still having joint pain but PT seems to be helping.   Prior to Admission medications   Medication Sig Start Date End Date Taking? Authorizing Provider  ascorbic acid (VITAMIN C) 100 MG tablet Take by mouth. 06/30/10  Yes Historical Provider, MD  Cholecalciferol 400 UNITS CAPS Take by mouth.   Yes Historical Provider, MD  diazepam (VALIUM) 5 MG tablet Take 5 mg by mouth at bedtime as needed for anxiety.   Yes Historical Provider, MD  glucose blood (ACCU-CHEK SMARTVIEW) test strip ACCU-CHEK SMARTVIEW (In Vitro Strip)  1 (one) Strip Strip check sugar once daily for 0 days  Quantity: 50;  Refills: 12   Ordered :01-Dec-2013  Miguel Aschoff MD;  Started 01-Dec-2013 Active Comments: Diagnosis code E11.9 12/01/13  Yes Historical Provider, MD  Magnesium 250 MG TABS Take 1 tablet by mouth daily.   Yes Historical Provider, MD  metFORMIN (GLUCOPHAGE-XR) 500 MG 24 hr tablet Take 500 mg by mouth 2 (two) times daily.  03/30/14  Yes Historical Provider, MD  Multiple Vitamin (MULTIVITAMIN) capsule Take 1 capsule by mouth daily.   Yes Historical Provider, MD  pantoprazole (PROTONIX) 40 MG tablet Take 1 tablet (40 mg total) by mouth daily. 05/17/15  Yes Richard Maceo Pro., MD  rosuvastatin (CRESTOR) 10 MG tablet Take 10 mg by mouth at bedtime.    Yes Historical Provider, MD  sertraline (ZOLOFT) 50 MG tablet Take 50 mg by mouth at bedtime.  02/09/14  Yes Historical Provider, MD  SYNTHROID 75 MCG tablet TAKE ONE TABLET BY MOUTH ONE TIME DAILY 07/27/14  Yes Jerrol Banana., MD    azelastine (ASTELIN) 0.1 % nasal spray  06/14/15   Historical Provider, MD  mometasone (ELOCON) 0.1 % lotion  06/14/15   Historical Provider, MD    Patient Active Problem List   Diagnosis Date Noted  . Arthralgia of multiple joints 11/12/2014  . Allergic rhinitis 11/12/2014  . Diabetes mellitus ([place]) 11/12/2014  . Essential (primary) hypertension 11/12/2014  . Personal history of other drug therapy 11/12/2014  . HLD (hyperlipidemia) 11/12/2014  . Adult hypothyroidism 11/12/2014  . Mild major depression ([place]) 11/12/2014  . Overweight 11/12/2014  . Restless leg 11/12/2014  . Encounter for screening colonoscopy 04/17/2014  . Type 2 diabetes mellitus ([place]) 12/23/2013    Past Medical History  Diagnosis Date  . Thyroid disease   . Rectocele   . Hypothyroidism   . Neuropathy ([place])   . Complication of anesthesia     slow to wake up in past  . Depression     controlled  . Diabetes mellitus without complication ([place])     controlled  . Osteoarthritis     knee, ankles, shoulders, back, neck, hands/fingers, wrists;     Social History   Social History  . Marital Status: Widowed    Spouse Name: N/A  . Number of Children: N/A  . Years of Education: N/A   Occupational History  . Not on file.   Social History Main Topics  . Smoking status: Never Smoker   .  Smokeless tobacco: Never Used  . Alcohol Use: 0.0 oz/week    0 Standard drinks or equivalent per week     Comment: occasionally  . Drug Use: No  . Sexual Activity: Not on file   Other Topics Concern  . Not on file   Social History Narrative    Allergies  Allergen Reactions  . Neomycin-Polymyxin-Gramicidin Other (See Comments)    Other Reaction: Other reaction  . Wilder Glade [Dapagliflozin] Other (See Comments)    Weak and dizzy   . Neosporin [Neomycin-Bacitracin Zn-Polymyx] Swelling  . Thimerosal Swelling  . Zostavax  [Zoster Vaccine Live] Rash    Do Not Administer Zostavax to Pt!    Review of Systems   Constitutional: Negative.   HENT: Negative.   Eyes: Negative.   Respiratory: Negative.   Cardiovascular: Negative.   Gastrointestinal: Positive for abdominal pain.  Genitourinary: Negative.   Musculoskeletal: Positive for joint pain.  Skin: Negative.   Neurological: Negative.   Endo/Heme/Allergies: Negative.   Psychiatric/Behavioral: Negative.     Immunization History  Administered Date(s) Administered  . Influenza-Unspecified 10/23/2014  . Pneumococcal Conjugate-13 12/16/2013  . Tdap 02/14/2013   Objective:  There were no vitals taken for this visit.  Physical Exam  Constitutional: She is oriented to person, place, and time and well-developed, well-nourished, and in no distress.  HENT:  Head: Normocephalic and atraumatic.  Right Ear: External ear normal.  Left Ear: External ear normal.  Nose: Nose normal.  Eyes: Conjunctivae are normal.  Neck: Neck supple. No thyromegaly present.  Cardiovascular: Normal rate, regular rhythm and normal heart sounds.   Pulmonary/Chest: Effort normal and breath sounds normal.  Abdominal: Soft.  Lymphadenopathy:    She has no cervical adenopathy.  Neurological: She is alert and oriented to person, place, and time.  Skin: Skin is warm and dry.  Psychiatric: Mood, memory, affect and judgment normal.    Lab Results  Component Value Date   WBC 7.8 04/26/2015   HGB 14.2 05/04/2014   HCT 38.5 04/26/2015   PLT 253 04/26/2015   GLUCOSE 127* 04/26/2015   CHOL 183 11/20/2014   TRIG 190* 11/20/2014   HDL 60 11/20/2014   LDLCALC 85 11/20/2014   TSH 0.98 12/01/2013   HGBA1C 6.9 05/17/2015    CMP     Component Value Date/Time   NA 138 04/26/2015 0750   NA 134* 07/28/2014 1007   K 4.5 04/26/2015 0750   CL 100 04/26/2015 0750   CO2 19 04/26/2015 0750   GLUCOSE 127* 04/26/2015 0750   GLUCOSE 162* 07/28/2014 1007   BUN 16 04/26/2015 0750   BUN 21* 07/28/2014 1007   CREATININE 0.60 04/26/2015 0750   CREATININE 0.7 05/04/2014    CALCIUM 9.2 04/26/2015 0750   PROT 6.4 04/26/2015 0750   ALBUMIN 4.5 04/26/2015 0750   AST 19 04/26/2015 0750   ALT 22 04/26/2015 0750   ALKPHOS 41 04/26/2015 0750   BILITOT <0.2 04/26/2015 0750   GFRNONAA 89 04/26/2015 0750   GFRAA 102 04/26/2015 0750    Assessment and Plan :  1. Generalized abdominal pain Ultrasound was normal. Dilated exam benign today. GI consult pending. More than 50% of visit is spent in counseling regarding these  issues. 2. Hyperlipidemia  - Lipid Panel With LDL/HDL Ratio - rosuvastatin (CRESTOR) 10 MG tablet; Take 1 tablet (10 mg total) by mouth at bedtime.  Dispense: 90 tablet; Refill: 3  3. Arthralgia   4. H/O breast augmentation Patient is not happy with the breast tissue around  her augmentation. - Ambulatory referral to Plastic Surgery  5. Right upper quadrant pain There does seem to be discomfort related to eating so HIDA scan is ordered after normal ultrasound has been obtained. - NM HEPATOBILIARY INCLUDING GB; Future - NM Hepato W/Eject Fract; Future  Miguel Aschoff MD [facility] 06/17/2015 11:21 AM

## 2015-06-21 ENCOUNTER — Ambulatory Visit (INDEPENDENT_AMBULATORY_CARE_PROVIDER_SITE_OTHER): Payer: Medicare Other | Admitting: Gastroenterology

## 2015-06-21 ENCOUNTER — Encounter: Payer: Self-pay | Admitting: Gastroenterology

## 2015-06-21 ENCOUNTER — Other Ambulatory Visit: Payer: Self-pay

## 2015-06-21 ENCOUNTER — Telehealth: Payer: Self-pay

## 2015-06-21 VITALS — BP 128/64 | HR 85 | Temp 97.6°F | Ht 60.0 in | Wt 149.0 lb

## 2015-06-21 DIAGNOSIS — R1032 Left lower quadrant pain: Secondary | ICD-10-CM | POA: Diagnosis not present

## 2015-06-21 MED ORDER — HYOSCYAMINE SULFATE 0.125 MG SL SUBL: 0.1250 mg | SUBLINGUAL_TABLET | Freq: Four times a day (QID) | SUBLINGUAL | Status: DC | PRN

## 2015-06-21 NOTE — Telephone Encounter (Signed)
Pt states she take 10 mg-aa

## 2015-06-21 NOTE — Telephone Encounter (Signed)
Left message on mobile phone and asked patient to call and let us know the dose she is [place]

## 2015-06-21 NOTE — Telephone Encounter (Signed)
lmtcb- need to know Crestor dose she is taking we have 10 mg and 20 mg, then need to fax over clarification form to mail order-form on desk [id number]

## 2015-06-21 NOTE — Telephone Encounter (Signed)
Pt returned call and would like a call back on her cell

## 2015-06-21 NOTE — Progress Notes (Signed)
Gastroenterology Consultation  Referring Provider:     Jerrol Banana.,* Primary Care Physician:  [name], MD Primary Gastroenterologist:  Dr. Allen Norris     Reason for Consultation:     Abdominal pain        HPI:   Nicole Young is a 75 y.o. y/o female referred for consultation & management of Abdominal pain by Dr. [name], MD.  This woman comes in today with a history of abdominal pain. The patient states that the pain is worse with eating on the right upper quadrant and nothing makes the left side abdominal pain any better or worse. The pain is not associated with any change in bowel habits. The patient had an ultrasound that did not show any cause of the abdominal pain. There is no report of any unexplained weight loss black stools or bloody stools. The patient had a colonoscopy last year for screening purposes by a Garment/textile technologist. She does report that she has always been a little on the constipated side but has not had any change in bowel habits. The pain has not woken her up from a sleep. It is a low level pain that is more of discomfort then pain that is usually present in the left side of her abdomen comes and goes area she has not had anything that she can pinpoint that makes the pain go away  Past Medical History  Diagnosis Date  . Thyroid disease   . Rectocele   . Hypothyroidism   . Neuropathy ([place])   . Complication of anesthesia     slow to wake up in past  . Depression     controlled  . Diabetes mellitus without complication ([place])     controlled  . Osteoarthritis     knee, ankles, shoulders, back, neck, hands/fingers, wrists;     Past Surgical History  Procedure Laterality Date  . Tonsilectomy, adenoidectomy, bilateral myringotomy and tubes    . Parathyroidectomy  1992  . Placement of breast implants  1975  . Colonoscopy  2005    Dr Bary Castilla  . Colonoscopy N/A 06/24/2014    Procedure: COLONOSCOPY;  Surgeon: Robert Bellow, MD;  Location:  [facility]OSCOPY;  Service: Endoscopy;  Laterality: N/A;  . Carpal tunnel release Right 08/26/2014    Procedure: CARPAL TUNNEL RELEASE;  Surgeon: Earnestine Leys, MD;  Location: [facility] ORS;  Service: Orthopedics;  Laterality: Right;    Prior to Admission medications   Medication Sig Start Date End Date Taking? Authorizing Provider  ascorbic acid (VITAMIN C) 100 MG tablet Take by mouth. 06/30/10  Yes Historical Provider, MD  Cholecalciferol 400 UNITS CAPS Take by mouth.   Yes Historical Provider, MD  diazepam (VALIUM) 5 MG tablet Take 5 mg by mouth at bedtime as needed for anxiety.   Yes Historical Provider, MD  Magnesium 250 MG TABS Take 1 tablet by mouth daily.   Yes Historical Provider, MD  metFORMIN (GLUCOPHAGE-XR) 500 MG 24 hr tablet Take 500 mg by mouth 2 (two) times daily.  03/30/14  Yes Historical Provider, MD  Multiple Vitamin (MULTIVITAMIN) capsule Take 1 capsule by mouth daily.   Yes Historical Provider, MD  pantoprazole (PROTONIX) 40 MG tablet Take 1 tablet (40 mg total) by mouth daily. 05/17/15  Yes Richard Maceo Pro., MD  rosuvastatin (CRESTOR) 10 MG tablet Take 1 tablet (10 mg total) by mouth at bedtime. 06/17/15  Yes Richard Maceo Pro., MD  sertraline (ZOLOFT) 50 MG tablet  TAKE 1 TABLET DAILY 06/18/15  Yes Richard Maceo Pro., MD  SYNTHROID 75 MCG tablet TAKE ONE TABLET BY MOUTH ONE TIME DAILY 07/27/14  Yes Jerrol Banana., MD  azelastine (ASTELIN) 0.1 % nasal spray Reported on 06/21/2015 06/14/15   Historical Provider, MD  glucose blood (ACCU-CHEK SMARTVIEW) test strip ACCU-CHEK SMARTVIEW (In Vitro Strip)  1 (one) Strip Strip check sugar once daily for 0 days  Quantity: 50;  Refills: 12   Ordered :01-Dec-2013  Miguel Aschoff MD;  Started 01-Dec-2013 Active Comments: Diagnosis code E11.9 12/01/13   Historical Provider, MD  hyoscyamine (LEVSIN SL) 0.125 MG SL tablet Place 1 tablet (0.125 mg total) under the tongue every 6 (six) hours as needed. 06/21/15   Lucilla Lame, MD    mometasone (ELOCON) 0.1 % lotion Reported on 06/21/2015 06/14/15   Historical Provider, MD    Family History  Problem Relation Age of Onset  . Cancer Father     prostate  . Prostate cancer Father   . Hypertension Mother   . Heart attack Mother   . Diabetes Paternal Uncle   . Congestive Heart Failure Maternal Grandmother   . Heart attack Maternal Grandfather   . CVA Paternal Grandmother   . Prostate cancer Paternal Grandfather      Social History  Substance Use Topics  . Smoking status: Never Smoker   . Smokeless tobacco: Never Used  . Alcohol Use: 0.0 oz/week    0 Standard drinks or equivalent per week     Comment: occasionally    Allergies as of 06/21/2015 - Review Complete 06/21/2015  Allergen Reaction Noted  . Neomycin-polymyxin-gramicidin Other (See Comments) 11/17/2014  . Wilder Glade [dapagliflozin] Other (See Comments) 06/19/2014  . Neosporin [neomycin-bacitracin zn-polymyx] Swelling 08/22/2013  . Thimerosal Swelling 04/16/2014  . Zostavax  [zoster vaccine live] Rash 11/12/2014    Review of Systems:    All systems reviewed and negative except where noted in HPI.   Physical Exam:  BP 128/64 mmHg  Pulse 85  Temp(Src) 97.6 F (36.4 C) (Oral)  Ht 5' (1.524 m)  Wt 149 lb (67.586 kg)  BMI 29.10 kg/m2 No LMP recorded. Patient is postmenopausal. Psych:  Alert and cooperative. Normal mood and affect. General:   Alert,  Well-developed, well-nourished, pleasant and cooperative in NAD Head:  Normocephalic and atraumatic. Eyes:  Sclera clear, no icterus.   Conjunctiva pink. Ears:  Normal auditory acuity. Nose:  No deformity, discharge, or lesions. Mouth:  No deformity or lesions,oropharynx pink & moist. Neck:  Supple; no masses or thyromegaly. Lungs:  Respirations even and unlabored.  Clear throughout to auscultation.   No wheezes, crackles, or rhonchi. No acute distress. Heart:  Regular rate and rhythm; no murmurs, clicks, rubs, or gallops. Abdomen:  Normal bowel sounds.   No bruits.  Soft, non-tender and non-distended without masses, hepatosplenomegaly or hernias noted.  No guarding or rebound tenderness.  Negative Carnett sign.   Rectal:  Deferred.  Msk:  Symmetrical without gross deformities.  Good, equal movement & strength bilaterally. Pulses:  Normal pulses noted. Extremities:  No clubbing or edema.  No cyanosis. Neurologic:  Alert and oriented x3;  grossly normal neurologically. Skin:  Intact without significant lesions or rashes.  No jaundice. Lymph Nodes:  No significant cervical adenopathy. Psych:  Alert and cooperative. Normal mood and affect.  Imaging Studies: US Abdomen Complete  05/27/2015  CLINICAL DATA:  Generalized abdominal pain. EXAM: ABDOMEN ULTRASOUND COMPLETE COMPARISON:  05/18/2014. FINDINGS: Gallbladder: No gallstones or wall thickening visualized. No  sonographic Percell Miller sign noted by sonographer. Common bile duct: Diameter: 6.3 mm Liver: Liver is echogenic consistent fatty infiltration and/or hepatocellular disease. No focal hepatic abnormality identified. IVC: No abnormality visualized. Pancreas: Visualized portion unremarkable. Spleen: Size and appearance within normal limits.  Accessory spleen. Right Kidney: Length: 13.1 cm. Echogenicity within normal limits. No mass or hydronephrosis visualized. Left Kidney: Length: 11.2 cm. Echogenicity within normal limits. No mass or hydronephrosis visualized. Abdominal aorta: No aneurysm visualized. Other findings: None. IMPRESSION: 1. Liver is echogenic consistent fatty infiltration and/or hepatocellular disease. 2. Exam is otherwise unremarkable. No gallstones or biliary distention. Electronically Signed   By: Marcello Moores  Register   On: 05/27/2015 11:25   US Transvaginal Non-ob  05/27/2015  CLINICAL DATA:  Abdominal pain.  Tenderness. EXAM: TRANSABDOMINAL AND TRANSVAGINAL ULTRASOUND OF PELVIS TECHNIQUE: Both transabdominal and transvaginal ultrasound examinations of the pelvis were performed.  Transabdominal technique was performed for global imaging of the pelvis including uterus, ovaries, adnexal regions, and pelvic cul-de-sac. It was necessary to proceed with endovaginal exam following the transabdominal exam to visualize the uterus and ovaries. COMPARISON:  05/18/2014. FINDINGS: Uterus Measurements: 5.9 x 3.0 x 4.2 cm. 2.4 x 2.6 x 2.8 cm calcified fibroid posterior uterine fundus. Endometrium Thickness: 3.8 mm.  No focal abnormality visualized. Right ovary Not visualized due to overlying bowel gas. Left ovary Not visualized due to overlying bowel gas. Other findings No abnormal free fluid. IMPRESSION: 2.8 cm calcified fibroid posterior fundus. Ovaries not visualized due to overlying bowel gas. No free pelvic fluid . Electronically Signed   By: [place]   On: 05/27/2015 11:30   US Pelvis Complete  05/27/2015  CLINICAL DATA:  Abdominal pain . EXAM: TRANSABDOMINAL AND TRANSVAGINAL ULTRASOUND OF PELVIS TECHNIQUE: Both transabdominal and transvaginal ultrasound examinations of the pelvis were performed. Transabdominal technique was performed for global imaging of the pelvis including uterus, ovaries, adnexal regions, and pelvic cul-de-sac. It was necessary to proceed with endovaginal exam following the transabdominal exam to visualize the uterus and ovaries. COMPARISON:  05/18/2014. FINDINGS: Uterus Measurements: 5.9 x 3.0 x 4.2 cm. 2.4 x 2.6 x 2.8 cm calcified fibroid posterior fundus. Endometrium Thickness: 3.8 mm.  No focal abnormality visualized. Right ovary Not visualized due to overlying bowel gas. Left ovary Not visualized due to overlying bowel gas. Other findings No abnormal free fluid. IMPRESSION: 1.  2.8 cm calcified fibroid posterior uterine fundus. 2. Exam is otherwise unremarkable. Ovaries not visualized due to overlying bowel gas. No free pelvic fluid. Electronically Signed   By: Marcello Moores  Register   On: 05/27/2015 11:29    Assessment and Plan:   Nicole Young is a 75 y.o.  y/o female comes in today with chronic abdominal pain with a ultrasound that did not show any cause of the symptoms. The patient may be having intestinal spasms versus muscular wall pain. The patient will be tried on a week of anti-inflammatory medication to be taken with food. The patient will also be given a trial of hyoscyamine to be taken sublingually as needed. She will also go through with the HIDA scan with [facility] to make sure her gallbladder is not involved. She had a colonoscopy within the last year by a Garment/textile technologist. If everything is negative and her pain continues the patient may need to be set up for a CT scan. The patient has been explained the plan and agrees with it.   Note: This dictation was prepared with Dragon dictation along with smaller phrase technology. Any transcriptional errors that  result from this process are unintentional.

## 2015-06-25 DIAGNOSIS — E119 Type 2 diabetes mellitus without complications: Secondary | ICD-10-CM | POA: Diagnosis not present

## 2015-06-25 DIAGNOSIS — E039 Hypothyroidism, unspecified: Secondary | ICD-10-CM | POA: Diagnosis not present

## 2015-06-29 ENCOUNTER — Encounter
Admission: RE | Admit: 2015-06-29 | Discharge: 2015-06-29 | Disposition: A | Discharge status: Home / Self Care | Payer: Medicare Other | Source: Ambulatory Visit | Attending: Family Medicine | Admitting: Family Medicine

## 2015-06-29 DIAGNOSIS — R1011 Right upper quadrant pain: Secondary | ICD-10-CM | POA: Insufficient documentation

## 2015-06-29 MED ORDER — TECHNETIUM TC 99M MEBROFENIN IV KIT
5.1500 | PACK | Freq: Once | INTRAVENOUS | Status: AC | PRN
  Administered 2015-06-29: 5.15 via INTRAVENOUS

## 2015-07-14 ENCOUNTER — Encounter: Payer: Self-pay | Admitting: Family Medicine

## 2015-07-14 ENCOUNTER — Ambulatory Visit (INDEPENDENT_AMBULATORY_CARE_PROVIDER_SITE_OTHER): Payer: Medicare Other | Admitting: Family Medicine

## 2015-07-14 VITALS — BP 112/58 | HR 88 | Temp 97.8°F | Resp 16 | Ht 60.5 in | Wt 148.0 lb

## 2015-07-14 DIAGNOSIS — E785 Hyperlipidemia, unspecified: Secondary | ICD-10-CM | POA: Diagnosis not present

## 2015-07-14 DIAGNOSIS — I10 Essential (primary) hypertension: Secondary | ICD-10-CM | POA: Diagnosis not present

## 2015-07-14 DIAGNOSIS — E119 Type 2 diabetes mellitus without complications: Secondary | ICD-10-CM | POA: Diagnosis not present

## 2015-07-14 NOTE — Progress Notes (Signed)
Patient: Nicole Young Female    DOB: 05/13/1940   75 y.o.   MRN: [id number] Visit Date: 07/14/2015  Today's Provider: [name], MD   Chief Complaint  Patient presents with  . Follow-up   Subjective:    HPI Patient comes in today wanting to have labs drawn before her surgery that is scheduled on 08/12/15. Patient brought in a list of lab tests that she needs. She is having breast surgery and feels ready to proceed with this. Her GI issues are better. She was not pleased with her visit with Dr. Allen Norris. The main reason for this visit today is that she has chronic scoliosis and like prescription for a TENS unit which is helped her in the past.    Allergies  Allergen Reactions  . Neomycin-Polymyxin-Gramicidin Other (See Comments)    Other Reaction: Other reaction  . Wilder Glade [Dapagliflozin] Other (See Comments)    Weak and dizzy   . Neosporin [Neomycin-Bacitracin Zn-Polymyx] Swelling  . Thimerosal Swelling  . Zostavax  [Zoster Vaccine Live] Rash    Do Not Administer Zostavax to Pt!   Current Meds  Medication Sig  . ascorbic acid (VITAMIN C) 100 MG tablet Take by mouth.  Marland Kitchen azelastine (ASTELIN) 0.1 % nasal spray Reported on 06/21/2015  . Cholecalciferol 400 UNITS CAPS Take by mouth.  . diazepam (VALIUM) 5 MG tablet Take 5 mg by mouth at bedtime as needed for anxiety.  Marland Kitchen glucose blood (ACCU-CHEK SMARTVIEW) test strip ACCU-CHEK SMARTVIEW (In Vitro Strip)  1 (one) Strip Strip check sugar once daily for 0 days  Quantity: 50;  Refills: 12   Ordered :01-Dec-2013  Miguel Aschoff MD;  Started 01-Dec-2013 Active Comments: Diagnosis code E11.9  . hyoscyamine (LEVSIN SL) 0.125 MG SL tablet Place 1 tablet (0.125 mg total) under the tongue every 6 (six) hours as needed.  . Magnesium 250 MG TABS Take 1 tablet by mouth daily.  . metFORMIN (GLUCOPHAGE-XR) 500 MG 24 hr tablet Take 500 mg by mouth 2 (two) times daily.   . mometasone (ELOCON) 0.1 % lotion Reported on 06/21/2015  .  Multiple Vitamin (MULTIVITAMIN) capsule Take 1 capsule by mouth daily.  . pantoprazole (PROTONIX) 40 MG tablet Take 1 tablet (40 mg total) by mouth daily.  . rosuvastatin (CRESTOR) 10 MG tablet Take 1 tablet (10 mg total) by mouth at bedtime.  . sertraline (ZOLOFT) 50 MG tablet TAKE 1 TABLET DAILY  . SYNTHROID 75 MCG tablet TAKE ONE TABLET BY MOUTH ONE TIME DAILY    Review of Systems  Constitutional: Negative.   Eyes: Negative.   Respiratory: Negative.   Cardiovascular: Negative.   Endocrine: Negative.   Genitourinary: Negative.   Musculoskeletal: Negative.   Neurological: Negative.   Hematological: Negative.   Psychiatric/Behavioral: Negative.     Social History  Substance Use Topics  . Smoking status: Never Smoker   . Smokeless tobacco: Never Used  . Alcohol Use: 0.0 oz/week    0 Standard drinks or equivalent per week     Comment: occasionally   Objective:   BP 112/58 mmHg  Pulse 88  Temp(Src) 97.8 F (36.6 C)  Resp 16  Ht 5' 0.5" (1.537 m)  Wt 148 lb (67.132 kg)  BMI 28.42 kg/m2  Physical Exam  Constitutional: She appears well-developed and well-nourished.  HENT:  Head: Normocephalic and atraumatic.  Right Ear: External ear normal.  Left Ear: External ear normal.  Nose: Nose normal.  Eyes: Conjunctivae are normal.  Neck:  Neck supple.  Cardiovascular: Normal rate, regular rhythm and normal heart sounds.   Pulmonary/Chest: Effort normal and breath sounds normal.  Abdominal: Soft. She exhibits no distension. There is no tenderness. There is no rebound.  Nontender ventral hernia versus rectus diathesis.  Skin: Skin is warm and dry.  Psychiatric: She has a normal mood and affect. Her behavior is normal. Judgment and thought content normal.        Assessment & Plan:     1. HLD (hyperlipidemia)  - Basic Metabolic Panel (BMET)  2. Type 2 diabetes mellitus without complication, without long-term current use of insulin (HCC)  - CBC with  Differential/Platelet  3. Essential (primary) hypertension  - EKG 12-Lead 4. Breast problems Patient ready for surgery. EKG unremarkable. 5. Chronic abdominal pain/IBS We discussed dietary changes and patient actually feels better since stopping dairy products. She also had a normal HIDA scan. CT scan will be next up if this persists.   6. Chronic scoliosis/chronic back pain We'll try TENS unit as I think this will help patient to deal with his daily pain. I have done the exam and reviewed the above chart and it is accurate to the best of my knowledge.  I have done the exam and reviewed the above chart and it is accurate to the best of my knowledge.  Richard Cranford Mon, MD  [facility]

## 2015-07-15 ENCOUNTER — Other Ambulatory Visit: Payer: Self-pay | Admitting: Family Medicine

## 2015-07-15 ENCOUNTER — Telehealth: Payer: Self-pay | Admitting: Family Medicine

## 2015-07-15 ENCOUNTER — Other Ambulatory Visit: Payer: Self-pay

## 2015-07-15 DIAGNOSIS — E119 Type 2 diabetes mellitus without complications: Secondary | ICD-10-CM | POA: Diagnosis not present

## 2015-07-15 DIAGNOSIS — E785 Hyperlipidemia, unspecified: Secondary | ICD-10-CM | POA: Diagnosis not present

## 2015-07-15 MED ORDER — LEVOTHYROXINE SODIUM 75 MCG PO TABS: 75.0000 ug | ORAL_TABLET | Freq: Every day | ORAL | Status: DC

## 2015-07-15 NOTE — Telephone Encounter (Signed)
Pt requesting we EKG to be faxed to Dr. Audrea Muscat Contogiannis at [phone]

## 2015-07-16 ENCOUNTER — Telehealth: Payer: Self-pay

## 2015-07-16 LAB — BASIC METABOLIC PANEL
BUN/Creatinine Ratio: 28 (ref 12–28)
BUN: 22 mg/dL (ref 8–27)
CO2: 20 mmol/L (ref 18–29)
Calcium: 9.4 mg/dL (ref 8.7–10.3)
Chloride: 99 mmol/L (ref 96–106)
Creatinine, Ser: 0.8 mg/dL (ref 0.57–1.00)
GFR, EST AFRICAN AMERICAN: 83 mL/min/{1.73_m2} (ref >59)
GFR, EST NON AFRICAN AMERICAN: 72 mL/min/{1.73_m2} (ref >59)
Glucose: 140 mg/dL — ABNORMAL HIGH (ref 65–99)
POTASSIUM: 4.6 mmol/L (ref 3.5–5.2)
SODIUM: 137 mmol/L (ref 134–144)

## 2015-07-16 LAB — LIPID PANEL WITH LDL/HDL RATIO
Cholesterol, Total: 161 mg/dL (ref 100–199)
HDL: 50 mg/dL (ref >39)
LDL CALC: 75 mg/dL (ref 0–99)
LDL/HDL RATIO: 1.5 ratio (ref 0.0–3.2)
Triglycerides: 182 mg/dL — ABNORMAL HIGH (ref 0–149)
VLDL Cholesterol Cal: 36 mg/dL (ref 5–40)

## 2015-07-16 LAB — CBC WITH DIFFERENTIAL/PLATELET
BASOS: 1 %
Basophils Absolute: 0.1 10*3/uL (ref 0.0–0.2)
EOS (ABSOLUTE): 0.2 10*3/uL (ref 0.0–0.4)
EOS: 2 %
HEMATOCRIT: 39.4 % (ref 34.0–46.6)
Hemoglobin: 13.6 g/dL (ref 11.1–15.9)
Immature Grans (Abs): 0 10*3/uL (ref 0.0–0.1)
Immature Granulocytes: 0 %
LYMPHS ABS: 2.9 10*3/uL (ref 0.7–3.1)
Lymphs: 32 %
MCH: 29.8 pg (ref 26.6–33.0)
MCHC: 34.5 g/dL (ref 31.5–35.7)
MCV: 86 fL (ref 79–97)
MONOCYTES: 7 %
MONOS ABS: 0.7 10*3/uL (ref 0.1–0.9)
NEUTROS ABS: 5.1 10*3/uL (ref 1.4–7.0)
NEUTROS PCT: 58 %
Platelets: 251 10*3/uL (ref 150–379)
RBC: 4.57 x10E6/uL (ref 3.77–5.28)
RDW: 14.3 % (ref 12.3–15.4)
WBC: 8.9 10*3/uL (ref 3.4–10.8)

## 2015-07-16 NOTE — Telephone Encounter (Signed)
Patient advised as below.  

## 2015-07-16 NOTE — Telephone Encounter (Signed)
-----   Message from Jerrol Banana., MD sent at 07/16/2015  9:38 AM EDT ----- Labs okay.

## 2015-08-12 ENCOUNTER — Other Ambulatory Visit: Payer: Self-pay | Admitting: Family Medicine

## 2015-09-03 ENCOUNTER — Other Ambulatory Visit: Payer: Self-pay | Admitting: Family Medicine

## 2015-09-03 NOTE — Telephone Encounter (Signed)
LOV 07/14/2015. Renaldo Fiddler, CMA

## 2015-10-09 DIAGNOSIS — Z23 Encounter for immunization: Secondary | ICD-10-CM | POA: Diagnosis not present

## 2015-10-19 ENCOUNTER — Encounter: Payer: Medicare Other | Admitting: Family Medicine

## 2015-10-22 ENCOUNTER — Telehealth: Payer: Self-pay | Admitting: Family Medicine

## 2015-10-22 NOTE — Telephone Encounter (Signed)
Called Pt to schedule AWV with [facility] before CPE with PCP for 12/7 - knb

## 2015-10-28 ENCOUNTER — Other Ambulatory Visit: Payer: Self-pay

## 2015-10-28 DIAGNOSIS — E7849 Other hyperlipidemia: Secondary | ICD-10-CM

## 2015-10-28 MED ORDER — ROSUVASTATIN CALCIUM 10 MG PO TABS: 10.0000 mg | ORAL_TABLET | Freq: Every day | ORAL | 3 refills | Status: DC

## 2015-10-29 NOTE — Telephone Encounter (Signed)
Pt returned call and stated that she was advised by her supplement insurance they will cover a CPE. Pt is scheduled to see [facility] on 12/16/15 @ 845 am for AWV & CPE at 10 am with Dr. Rosanna Randy after. Thanks TNP

## 2015-12-07 ENCOUNTER — Ambulatory Visit (INDEPENDENT_AMBULATORY_CARE_PROVIDER_SITE_OTHER): Payer: Medicare Other | Admitting: Family Medicine

## 2015-12-07 ENCOUNTER — Other Ambulatory Visit: Payer: Self-pay | Admitting: Family Medicine

## 2015-12-07 VITALS — BP 122/60 | HR 76 | Temp 97.5°F | Resp 16 | Wt 148.0 lb

## 2015-12-07 DIAGNOSIS — E119 Type 2 diabetes mellitus without complications: Secondary | ICD-10-CM | POA: Diagnosis not present

## 2015-12-07 DIAGNOSIS — B3731 Acute candidiasis of vulva and vagina: Secondary | ICD-10-CM

## 2015-12-07 DIAGNOSIS — B373 Candidiasis of vulva and vagina: Secondary | ICD-10-CM

## 2015-12-07 LAB — POCT GLYCOSYLATED HEMOGLOBIN (HGB A1C): HEMOGLOBIN A1C: 6.7

## 2015-12-07 MED ORDER — TERCONAZOLE 0.8 % VA CREA: 1.0000 | TOPICAL_CREAM | Freq: Every day | VAGINAL | 0 refills | Status: DC

## 2015-12-07 NOTE — Progress Notes (Signed)
Nicole Young  MRN: [id number] DOB: 03-24-1940  Subjective:  HPI   The patient is a 75 year old female who present for evaluation of yeast infection.  The patient was seen by [facility] and treated with 1 Diflucan.  She was cleared for a few day but 5 days later she started having symptoms.  She complains of itching, burning and vaginal discharge.  She has not been on any antibiotics, her glucose has been good and she is not sexually active.    Patient Active Problem List   Diagnosis Date Noted  . Arthralgia of multiple joints 11/12/2014  . Allergic rhinitis 11/12/2014  . Diabetes mellitus ([place]) 11/12/2014  . Essential (primary) hypertension 11/12/2014  . Personal history of other drug therapy 11/12/2014  . HLD (hyperlipidemia) 11/12/2014  . Adult hypothyroidism 11/12/2014  . Mild major depression ([place]) 11/12/2014  . Overweight 11/12/2014  . Restless leg 11/12/2014  . Encounter for screening colonoscopy 04/17/2014  . Type 2 diabetes mellitus ([place]) 12/23/2013    Past Medical History:  Diagnosis Date  . Complication of anesthesia    slow to wake up in past  . Depression    controlled  . Diabetes mellitus without complication ([place])    controlled  . Hypothyroidism   . Neuropathy ([address])   . Osteoarthritis    knee, ankles, shoulders, back, neck, hands/fingers, wrists;   . Rectocele   . Thyroid disease     Social History   Social History  . Marital status: Widowed    Spouse name: N/A  . Number of children: N/A  . Years of education: N/A   Occupational History  . Not on file.   Social History Main Topics  . Smoking status: Never Smoker  . Smokeless tobacco: Never Used  . Alcohol use 0.0 oz/week     Comment: occasionally  . Drug use: No  . Sexual activity: Not on file   Other Topics Concern  . Not on file   Social History Narrative  . No narrative on file    Outpatient Encounter Prescriptions as of 12/07/2015  Medication Sig Note  . ascorbic acid  (VITAMIN C) 100 MG tablet Take by mouth. 11/12/2014: rarely Received from: [facility]  . azelastine (ASTELIN) 0.1 % nasal spray Reported on 06/21/2015 06/17/2015: Received from: External Pharmacy  . Cholecalciferol 400 UNITS CAPS Take by mouth. 11/12/2014: Received from: [facility]  . diazepam (VALIUM) 5 MG tablet Take 5 mg by mouth at bedtime as needed for anxiety.   Marland Kitchen glucose blood (ACCU-CHEK SMARTVIEW) test strip ACCU-CHEK SMARTVIEW (In Vitro Strip)  1 (one) Strip Strip check sugar once daily for 0 days  Quantity: 50;  Refills: 12   Ordered :01-Dec-2013  Miguel Aschoff MD;  Started 01-Dec-2013 Active Comments: Diagnosis code E11.9 11/12/2014: Diagnosis code E11.9 Received from: [facility]  . hyoscyamine (LEVSIN SL) 0.125 MG SL tablet Place 1 tablet (0.125 mg total) under the tongue every 6 (six) hours as needed.   Marland Kitchen levothyroxine (SYNTHROID, LEVOTHROID) 75 MCG tablet TAKE 1 TABLET (75 MCG TOTAL) BY MOUTH DAILY. NEED APPOINTMENT FOR MORE REFILLS   . Magnesium 250 MG TABS Take 1 tablet by mouth daily.   . metFORMIN (GLUCOPHAGE-XR) 500 MG 24 hr tablet Take 500 mg by mouth 2 (two) times daily.  06/19/2014: .   . mometasone (ELOCON) 0.1 % lotion Reported on 06/21/2015 06/17/2015: Received from: External Pharmacy  . Multiple Vitamin (MULTIVITAMIN) capsule Take 1 capsule by mouth daily.   Marland Kitchen  pantoprazole (PROTONIX) 40 MG tablet TAKE 1 TABLET (40 MG TOTAL) BY MOUTH DAILY.   . rosuvastatin (CRESTOR) 10 MG tablet Take 1 tablet (10 mg total) by mouth at bedtime.   . sertraline (ZOLOFT) 50 MG tablet TAKE 1 TABLET DAILY    No facility-administered encounter medications on file as of 12/07/2015.     Allergies  Allergen Reactions  . Neomycin-Polymyxin-Gramicidin Other (See Comments)    Other Reaction: Other reaction  . Wilder Glade [Dapagliflozin] Other (See Comments)    Weak and dizzy   . Neosporin [Neomycin-Bacitracin Zn-Polymyx] Swelling  . Thimerosal  Swelling  . Zostavax  [Zoster Vaccine Live] Rash    Do Not Administer Zostavax to Pt!    Review of Systems  Constitutional: Negative for chills, fever and malaise/fatigue.  Respiratory: Negative for cough, shortness of breath and wheezing.   Cardiovascular: Negative for chest pain, palpitations, orthopnea and leg swelling.  Genitourinary: Negative for dysuria, flank pain, frequency, hematuria and urgency.  Neurological: Negative for weakness.    Objective:  BP 122/60 (BP Location: Right Arm, Patient Position: Sitting, Cuff Size: Normal)   Pulse 76   Temp 97.5 F (36.4 C) (Oral)   Resp 16   Wt 148 lb (67.1 kg)   BMI 28.43 kg/m   Physical Exam  Constitutional: She is well-developed, well-nourished, and in no distress.  HENT:  Head: Normocephalic and atraumatic.  Right Ear: External ear normal.  Left Ear: External ear normal.  Nose: Nose normal.  Eyes: Conjunctivae are normal.  Neck: Neck supple. No thyromegaly present.  Cardiovascular: Normal rate, regular rhythm and normal heart sounds.   Pulmonary/Chest: Effort normal and breath sounds normal.  Abdominal: Soft.  Neurological: She is alert. No cranial nerve deficit. She exhibits abnormal muscle tone. Gait normal. Coordination normal. GCS score is 15.  Skin: Skin is warm and dry.  Psychiatric: Mood, memory, affect and judgment normal.    Assessment and Plan :   Type 2 diabetes Excellent control with A1c of 6.7 today. Return to clinic 4 months Hypothyroid Hypertension Hyperlipidemia Osteoarthritis Recent surgical repair of leaking breast implants Patient is very pleased with the outcome of her surgery by Dr. Carlisle Cater.  I have done the exam and reviewed the chart and it is accurate to the best of my knowledge. Development worker, community has been used and  any errors in dictation or transcription are unintentional. Miguel Aschoff M.D. [facility]

## 2015-12-13 ENCOUNTER — Telehealth: Payer: Self-pay | Admitting: Family Medicine

## 2015-12-13 NOTE — Telephone Encounter (Signed)
error 

## 2015-12-16 ENCOUNTER — Ambulatory Visit (INDEPENDENT_AMBULATORY_CARE_PROVIDER_SITE_OTHER): Payer: Medicare Other | Admitting: Family Medicine

## 2015-12-16 ENCOUNTER — Ambulatory Visit (INDEPENDENT_AMBULATORY_CARE_PROVIDER_SITE_OTHER): Payer: Medicare Other

## 2015-12-16 VITALS — BP 122/63 | HR 80 | Temp 97.7°F | Wt 149.0 lb

## 2015-12-16 VITALS — BP 122/63 | HR 80 | Temp 97.7°F | Ht 60.0 in | Wt 149.4 lb

## 2015-12-16 DIAGNOSIS — E785 Hyperlipidemia, unspecified: Secondary | ICD-10-CM

## 2015-12-16 DIAGNOSIS — K219 Gastro-esophageal reflux disease without esophagitis: Secondary | ICD-10-CM | POA: Diagnosis not present

## 2015-12-16 DIAGNOSIS — I10 Essential (primary) hypertension: Secondary | ICD-10-CM | POA: Diagnosis not present

## 2015-12-16 DIAGNOSIS — E119 Type 2 diabetes mellitus without complications: Secondary | ICD-10-CM | POA: Diagnosis not present

## 2015-12-16 DIAGNOSIS — F320 Major depressive disorder, single episode, mild: Secondary | ICD-10-CM

## 2015-12-16 DIAGNOSIS — E039 Hypothyroidism, unspecified: Secondary | ICD-10-CM | POA: Diagnosis not present

## 2015-12-16 DIAGNOSIS — Z0000 Encounter for general adult medical examination without abnormal findings: Secondary | ICD-10-CM

## 2015-12-16 MED ORDER — RANITIDINE HCL 150 MG PO TABS: 150.0000 mg | ORAL_TABLET | Freq: Two times a day (BID) | ORAL | 3 refills | Status: DC

## 2015-12-16 MED ORDER — SERTRALINE HCL 100 MG PO TABS: 100.0000 mg | ORAL_TABLET | Freq: Every day | ORAL | 3 refills | Status: DC

## 2015-12-16 NOTE — Progress Notes (Signed)
Patient: Nicole Young, Female    DOB: September 23, 1940, 75 y.o.   MRN: [id number] Visit Date: 12/16/2015  Today's Provider: [name], MD   Chief Complaint  Patient presents with  . Annual Exam   Subjective:  Nicole Young is a 75 y.o. female who presents today for health maintenance and complete physical. She feels well. She reports exercising daily. She reports she is sleeping well.  06/24/2014 Colonoscopy 2016 Pap-patient reported normal, done at gyn 2016 Mamm-patient reported normal, done at gyn 07/21/2011 BMD-osteopenia  Immunization History  Administered Date(s) Administered  . Influenza-Unspecified 10/23/2014  . Pneumococcal Conjugate-13 12/16/2013  . Tdap 02/14/2013      Review of Systems  Constitutional: Negative.   HENT: Positive for postnasal drip, rhinorrhea and sinus pressure.   Eyes: Negative.   Respiratory: Negative.   Cardiovascular: Negative.   Gastrointestinal: Negative.   Endocrine: Positive for heat intolerance.  Genitourinary: Negative.   Musculoskeletal: Positive for arthralgias, back pain, neck pain and neck stiffness.  Skin: Negative.   Allergic/Immunologic: Negative.   Neurological: Negative.   Hematological: Negative.   Psychiatric/Behavioral: The patient is nervous/anxious.     Social History   Social History  . Marital status: Widowed    Spouse name: N/A  . Number of children: N/A  . Years of education: N/A   Occupational History  . Not on file.   Social History Main Topics  . Smoking status: Never Smoker  . Smokeless tobacco: Never Used  . Alcohol use 1.2 - 1.8 oz/week    2 - 3 Glasses of wine per week  . Drug use: No  . Sexual activity: Not on file   Other Topics Concern  . Not on file   Social History Narrative  . No narrative on file    Patient Active Problem List   Diagnosis Date Noted  . Arthralgia of multiple joints 11/12/2014  . Allergic rhinitis 11/12/2014  . Diabetes mellitus ([place]) 11/12/2014  .  Essential (primary) hypertension 11/12/2014  . Personal history of other drug therapy 11/12/2014  . HLD (hyperlipidemia) 11/12/2014  . Adult hypothyroidism 11/12/2014  . Mild major depression ([place]) 11/12/2014  . Overweight 11/12/2014  . Restless leg 11/12/2014  . Encounter for screening colonoscopy 04/17/2014  . Type 2 diabetes mellitus ([place]) 12/23/2013    Past Surgical History:  Procedure Laterality Date  . BREAST SURGERY     impants replaced with lift  . CARPAL TUNNEL RELEASE Right 08/26/2014   Procedure: CARPAL TUNNEL RELEASE;  Surgeon: Earnestine Leys, MD;  Location: [facility] ORS;  Service: Orthopedics;  Laterality: Right;  . COLONOSCOPY  2005   Dr Bary Castilla  . COLONOSCOPY N/A 06/24/2014   Procedure: COLONOSCOPY;  Surgeon: Robert Bellow, MD;  Location: [facility] ENDOSCOPY;  Service: Endoscopy;  Laterality: N/A;  . PARATHYROIDECTOMY  1992  . PLACEMENT OF BREAST IMPLANTS  1975  . TONSILECTOMY, ADENOIDECTOMY, BILATERAL MYRINGOTOMY AND TUBES      Her family history includes CVA in her paternal grandmother; Cancer in her father; Congestive Heart Failure in her maternal grandmother; Diabetes in her paternal uncle; Heart attack in her maternal grandfather and mother; Hypertension in her mother; Prostate cancer in her father and paternal grandfather.     Outpatient Encounter Prescriptions as of 12/16/2015  Medication Sig Note  . ascorbic acid (VITAMIN C) 100 MG tablet Take 500 mg by mouth 2 (two) times daily.  11/12/2014: rarely Received from: [facility]  . azelastine (ASTELIN) 0.1 % nasal spray Reported on 06/21/2015 06/17/2015:  Received from: External Pharmacy  . Cholecalciferol 400 UNITS CAPS Take by mouth. 11/12/2014: Received from: [facility]  . diazepam (VALIUM) 5 MG tablet TAKE ONE TABLET BY MOUTH NIGHTLY AT BEDTIME AS NEEDED (Patient taking differently: TAKE ONE half TABLET BY MOUTH NIGHTLY AT BEDTIME AS NEEDED)   . glucose blood (ACCU-CHEK SMARTVIEW) test  strip ACCU-CHEK SMARTVIEW (In Vitro Strip)  1 (one) Strip Strip check sugar once daily for 0 days  Quantity: 50;  Refills: 12   Ordered :01-Dec-2013  Miguel Aschoff MD;  Started 01-Dec-2013 Active Comments: Diagnosis code E11.9 11/12/2014: Diagnosis code E11.9 Received from: [facility]  . levothyroxine (SYNTHROID, LEVOTHROID) 75 MCG tablet TAKE 1 TABLET (75 MCG TOTAL) BY MOUTH DAILY. NEED APPOINTMENT FOR MORE REFILLS   . Magnesium 250 MG TABS Take 1 tablet by mouth daily.   . metFORMIN (GLUCOPHAGE-XR) 500 MG 24 hr tablet Take 500 mg by mouth 2 (two) times daily.  06/19/2014: .   . mometasone (ELOCON) 0.1 % lotion Reported on 06/21/2015 06/17/2015: Received from: External Pharmacy  . Multiple Vitamin (MULTIVITAMIN) capsule Take 1 capsule by mouth daily.   . pantoprazole (PROTONIX) 40 MG tablet TAKE 1 TABLET (40 MG TOTAL) BY MOUTH DAILY.   . rosuvastatin (CRESTOR) 10 MG tablet Take 1 tablet (10 mg total) by mouth at bedtime.   . sertraline (ZOLOFT) 50 MG tablet TAKE 1 TABLET DAILY   . terconazole (TERAZOL 3) 0.8 % vaginal cream Place 1 applicator vaginally at bedtime. (Patient not taking: Reported on 12/16/2015)    No facility-administered encounter medications on file as of 12/16/2015.     Patient Care Team: Jerrol Banana., MD as PCP - General (Family Medicine) Birder Robson, MD as Referring Physician (Ophthalmology)      Objective:   Vitals:  Vitals:   12/16/15 0944  BP: 122/63  Pulse: 80  Temp: 97.7 F (36.5 C)  TempSrc: Oral  Weight: 149 lb (67.6 kg)    Physical Exam  Constitutional: She is oriented to person, place, and time. She appears well-developed and well-nourished.  HENT:  Head: Normocephalic and atraumatic.  Right Ear: External ear normal.  Left Ear: External ear normal.  Nose: Nose normal.  Mouth/Throat: Oropharynx is clear and moist.  Eyes: Conjunctivae are normal. No scleral icterus.  Neck: Neck supple. No thyromegaly present.   Cardiovascular: Normal rate, regular rhythm and normal heart sounds.   Pulmonary/Chest: Effort normal and breath sounds normal.  Breast --bilateral implants that are symmetrical and firm.  Abdominal: Soft.  Musculoskeletal: She exhibits no deformity.  Lymphadenopathy:    She has no cervical adenopathy.  Neurological: She is alert and oriented to person, place, and time. No cranial nerve deficit. She exhibits normal muscle tone. Coordination normal.  Skin: Skin is warm and dry.  Psychiatric: She has a normal mood and affect. Her behavior is normal. Judgment and thought content normal.     Depression Screen PHQ 2/9 Scores 12/16/2015 05/17/2015  PHQ - 2 Score 0 0      Assessment & Plan:     Routine Health Maintenance and Physical Exam  Exercise Activities and Dietary recommendations Goals    . Increase water intake          Starting 12/16/15,  I will continue drinking 5 glasses of water a day.       Immunization History  Administered Date(s) Administered  . Influenza-Unspecified 10/23/2014  . Pneumococcal Conjugate-13 12/16/2013  . Tdap 02/14/2013    Health Maintenance  Topic  Date Due  . FOOT EXAM  03/19/1949  . URINE MICROALBUMIN  03[birth year]19/1949  . PNA vac Low Risk Adult (2 of 2 - PPSV23) 12/17/2014  . ZOSTAVAX  12/15/2025 (Originally 03/20/1999)  . OPHTHALMOLOGY EXAM  03/09/2016  . HEMOGLOBIN A1C  06/05/2016  . TETANUS/TDAP  02/15/2023  . INFLUENZA VACCINE  Completed  . DEXA SCAN  Completed     Discussed health benefits of physical activity, and encouraged her to engage in regular exercise appropriate for her age and condition.    1. Essential (primary) hypertension  - CBC with Differential/Platelet - Comprehensive metabolic panel  2. Type 2 diabetes mellitus without complication, without long-term current use of insulin (HCC)  - Hemoglobin A1c  3. Mild major depression ([place])  Daily stressors from dealing with 2 grandsons with autism. Increase sertraline  from 50mg  to 100mg  daily. RTC 1-2 months. 4. Hyperlipidemia, unspecified hyperlipidemia type  - Lipid Panel With LDL/HDL Ratio  5. Adult hypothyroidism  - TSH  6. Gastroesophageal reflux disease, esophagitis presence not specified Discussed risk/benefits of PPI and will stop protonix and start Zantac 150mg  BID. - ranitidine (ZANTAC) 150 MG tablet; Take 1 tablet (150 mg total) by mouth 2 (two) times daily.  Dispense: 180 tablet; Refill: 3  HPI, Exam and A&P Transcribed under the direction and in the presence of Miguel Aschoff, Brooke Bonito., MD. Electronically Signed: Althea Charon, RMA I have done the exam and reviewed the chart and it is accurate to the best of my knowledge. Development worker, community has been used and  any errors in dictation or transcription are unintentional. Miguel Aschoff M.D. [facility]

## 2015-12-16 NOTE — Patient Instructions (Signed)
Nicole Young , Thank you for taking time to come for your Medicare Wellness Visit. I appreciate your ongoing commitment to your health goals. Please review the following plan we discussed and let me know if I can assist you in the future.   These are the goals we discussed: Goals    . Increase water intake          Starting 12/16/15,  I will continue drinking 5 glasses of water a day.       This is a list of the screening recommended for you and due dates:  Health Maintenance  Topic Date Due  . Complete foot exam   03[birth year]19/1950  . Eye exam for diabetics  03[birth year]19/1950  . Urine Protein Check  03/19/1950  . Shingles Vaccine  03/20/1999  . Pneumonia vaccines (2 of 2 - PPSV23) 12/17/2014  . Flu Shot  08/10/2015  . Hemoglobin A1C  06/05/2016  . Tetanus Vaccine  02/15/2023  . DEXA scan (bone density measurement)  Completed   Preventive Care for Adults  A healthy lifestyle and preventive care can promote health and wellness. Preventive health guidelines for adults include the following key practices.  . A routine yearly physical is a good way to check with your health care provider about your health and preventive screening. It is a chance to share any concerns and updates on your health and to receive a thorough exam.  . Visit your dentist for a routine exam and preventive care every 6 months. Brush your teeth twice a day and floss once a day. Good oral hygiene prevents tooth decay and gum disease.  . The frequency of eye exams is based on your age, health, family medical history, use  of contact lenses, and other factors. Follow your health care provider's ecommendations for frequency of eye exams.  . Eat a healthy diet. Foods like vegetables, fruits, whole grains, low-fat dairy products, and lean protein foods contain the nutrients you need without too many calories. Decrease your intake of foods high in solid fats, added sugars, and salt. Eat the right amount of calories for you. Get  information about a proper diet from your health care provider, if necessary.  . Regular physical exercise is one of the most important things you can do for your health. Most adults should get at least 150 minutes of moderate-intensity exercise (any activity that increases your heart rate and causes you to sweat) each week. In addition, most adults need muscle-strengthening exercises on 2 or more days a week.  Silver Sneakers may be a benefit available to you. To determine eligibility, you may visit the website: www.silversneakers.com or contact program at (386)591-8864 Mon-Fri between 8AM-8PM.   . Maintain a healthy weight. The body mass index (BMI) is a screening tool to identify possible weight problems. It provides an estimate of body fat based on height and weight. Your health care provider can find your BMI and can help you achieve or maintain a healthy weight.   For adults 20 years and older: ? A BMI below 18.5 is considered underweight. ? A BMI of 18.5 to 24.9 is normal. ? A BMI of 25 to 29.9 is considered overweight. ? A BMI of 30 and above is considered obese.   . Maintain normal blood lipids and cholesterol levels by exercising and minimizing your intake of saturated fat. Eat a balanced diet with plenty of fruit and vegetables. Blood tests for lipids and cholesterol should begin at age 65 and be repeated every  5 years. If your lipid or cholesterol levels are high, you are over 50, or you are at high risk for heart disease, you may need your cholesterol levels checked more frequently. Ongoing high lipid and cholesterol levels should be treated with medicines if diet and exercise are not working.  . If you smoke, find out from your health care provider how to quit. If you do not use tobacco, please do not start.  . If you choose to drink alcohol, please do not consume more than 2 drinks per day. One drink is considered to be 12 ounces (355 mL) of beer, 5 ounces (148 mL) of wine, or 1.5  ounces (44 mL) of liquor.  . If you are 67-75 years old, ask your health care provider if you should take aspirin to prevent strokes.  . Use sunscreen. Apply sunscreen liberally and repeatedly throughout the day. You should seek shade when your shadow is shorter than you. Protect yourself by wearing long sleeves, pants, a wide-brimmed hat, and sunglasses year round, whenever you are outdoors.  . Once a month, do a whole body skin exam, using a mirror to look at the skin on your back. Tell your health care provider of new moles, moles that have irregular borders, moles that are larger than a pencil eraser, or moles that have changed in shape or color.

## 2015-12-16 NOTE — Progress Notes (Signed)
Subjective:   Nicole Young is a 75 y.o. female who presents for Medicare Annual (Subsequent) preventive examination.  Review of Systems:  N/A  Cardiac Risk Factors include: advanced age (>16men, >51 women);diabetes mellitus;dyslipidemia;hypertension     Objective:     Vitals: BP 122/63 (BP Location: Right Arm)   Pulse 80   Temp 97.7 F (36.5 C) (Oral)   Ht 5' (1.524 m)   Wt 149 lb 6 oz (67.8 kg)   BMI 29.17 kg/m   Body mass index is 29.17 kg/m.   Tobacco History  Smoking Status  . Never Smoker  Smokeless Tobacco  . Never Used     Counseling given: Not Answered   Past Medical History:  Diagnosis Date  . Complication of anesthesia    slow to wake up in past  . Depression    controlled  . Diabetes mellitus without complication ([place])    controlled  . Hypothyroidism   . Neuropathy ([place])   . Osteoarthritis    knee, ankles, shoulders, back, neck, hands/fingers, wrists;   . Rectocele   . Thyroid disease    Past Surgical History:  Procedure Laterality Date  . BREAST SURGERY     impants replaced with lift  . CARPAL TUNNEL RELEASE Right 08/26/2014   Procedure: CARPAL TUNNEL RELEASE;  Surgeon: Earnestine Leys, MD;  Location: [facility] ORS;  Service: Orthopedics;  Laterality: Right;  . COLONOSCOPY  2005   Dr Bary Castilla  . COLONOSCOPY N/A 06/24/2014   Procedure: COLONOSCOPY;  Surgeon: Robert Bellow, MD;  Location: [facility] ENDOSCOPY;  Service: Endoscopy;  Laterality: N/A;  . PARATHYROIDECTOMY  1992  . PLACEMENT OF BREAST IMPLANTS  1975  . TONSILECTOMY, ADENOIDECTOMY, BILATERAL MYRINGOTOMY AND TUBES     Family History  Problem Relation Age of Onset  . Cancer Father     prostate  . Prostate cancer Father   . Hypertension Mother   . Heart attack Mother   . Heart attack Maternal Grandfather   . Diabetes Paternal Uncle   . Congestive Heart Failure Maternal Grandmother   . CVA Paternal Grandmother   . Prostate cancer Paternal Grandfather    History  Sexual Activity   . Sexual activity: Not on file    Outpatient Encounter Prescriptions as of 12/16/2015  Medication Sig  . ascorbic acid (VITAMIN C) 100 MG tablet Take 500 mg by mouth 2 (two) times daily.   Marland Kitchen azelastine (ASTELIN) 0.1 % nasal spray Reported on 06/21/2015  . Cholecalciferol 400 UNITS CAPS Take by mouth.  . diazepam (VALIUM) 5 MG tablet TAKE ONE TABLET BY MOUTH NIGHTLY AT BEDTIME AS NEEDED (Patient taking differently: TAKE ONE half TABLET BY MOUTH NIGHTLY AT BEDTIME AS NEEDED)  . glucose blood (ACCU-CHEK SMARTVIEW) test strip ACCU-CHEK SMARTVIEW (In Vitro Strip)  1 (one) Strip Strip check sugar once daily for 0 days  Quantity: 50;  Refills: 12   Ordered :01-Dec-2013  Miguel Aschoff MD;  [facility] [place] [phone] Active Comments: Diagnosis code E11.9  . levothyroxine (SYNTHROID, LEVOTHROID) 75 MCG tablet TAKE 1 TABLET (75 MCG TOTAL) BY MOUTH DAILY. NEED APPOINTMENT FOR MORE REFILLS  . Magnesium 250 MG TABS Take 1 tablet by mouth daily.  . metFORMIN (GLUCOPHAGE-XR) 500 MG 24 hr tablet Take 500 mg by mouth 2 (two) times daily.   . mometasone (ELOCON) 0.1 % lotion Reported on 06/21/2015  . Multiple Vitamin (MULTIVITAMIN) capsule Take 1 capsule by mouth daily.  . pantoprazole (PROTONIX) 40 MG tablet TAKE 1 TABLET (40 MG TOTAL) BY MOUTH  DAILY.  . rosuvastatin (CRESTOR) 10 MG tablet Take 1 tablet (10 mg total) by mouth at bedtime.  . sertraline (ZOLOFT) 50 MG tablet TAKE 1 TABLET DAILY  . terconazole (TERAZOL 3) 0.8 % vaginal cream Place 1 applicator vaginally at bedtime. (Patient not taking: Reported on 12/16/2015)   No facility-administered encounter medications on file as of 12/16/2015.     Activities of Daily Living In your present state of health, do you have any difficulty performing the following activities: 12/16/2015  Hearing? Y  Vision? N  Difficulty concentrating or making decisions? N  Walking or climbing stairs? N  Dressing or bathing? N  Doing errands, shopping? N  Preparing Food and  eating ? N  Using the Toilet? N  In the past six months, have you accidently leaked urine? N  Do you have problems with loss of bowel control? N  Managing your Medications? N  Managing your Finances? N  Housekeeping or managing your Housekeeping? N  Some recent data might be hidden    Patient Care Team: Jerrol Banana., MD as PCP - General (Family Medicine) Birder Robson, MD as Referring Physician (Ophthalmology)    Assessment:     Exercise Activities and Dietary recommendations Current Exercise Habits: Home exercise routine, Type of exercise: walking;strength training/weights, Time (Minutes): 30, Frequency (Times/Week): 2, Weekly Exercise (Minutes/Week): 60, Intensity: Mild  Goals    . Increase water intake          Starting 12/16/15,  I will continue drinking 5 glasses of water a day.      Fall Risk Fall Risk  12/16/2015 05/17/2015  Falls in the past year? No No   Depression Screen PHQ 2/9 Scores 12/16/2015 05/17/2015  PHQ - 2 Score 0 0     Cognitive Function     6CIT Screen 12/16/2015  What Year? 0 points  What month? 0 points  What time? 0 points  Count back from 20 0 points  Months in reverse 0 points  Repeat phrase 0 points  Total Score 0    Immunization History  Administered Date(s) Administered  . Influenza-Unspecified 10/23/2014  . Pneumococcal Conjugate-13 12/16/2013  . Tdap 02/14/2013   Screening Tests Health Maintenance  Topic Date Due  . FOOT EXAM  03/19/1949  . URINE MICROALBUMIN  03/19/1949  . PNA vac Low Risk Adult (2 of 2 - PPSV23) 12/17/2014  . ZOSTAVAX  12/15/2025 (Originally 03/20/1999)  . OPHTHALMOLOGY EXAM  03/09/2016  . HEMOGLOBIN A1C  06/05/2016  . TETANUS/TDAP  02/15/2023  . INFLUENZA VACCINE  Completed  . DEXA SCAN  Completed      Plan:  I have personally reviewed and addressed the Medicare Annual Wellness questionnaire and have noted the following in the patient's chart:  A. Medical and social history B. Use of  alcohol, tobacco or illicit drugs  C. Current medications and supplements D. Functional ability and status E.  Nutritional status F.  Physical activity G. Advance directives H. List of other physicians I.  Hospitalizations, surgeries, and ER visits in previous 12 months J.  [place] such as hearing and vision if needed, cognitive and depression L. Referrals and appointments - none  In addition, I have reviewed and discussed with patient certain preventive protocols, quality metrics, and best practice recommendations. A written personalized care plan for preventive services as well as general preventive health recommendations were provided to patient.  See attached scanned questionnaire for additional information.   Signed,  Fabio Neighbors, LPN Nurse Health  Advisor   MD Recommendations: Appointment to follow with PCP. Need to follow up on Microalbumin labs and diabetic foot exam.  I have reviewed the health advisors note, was  available for consultation and I agree with documentation and plan. Miguel Aschoff MD [facility]

## 2015-12-22 ENCOUNTER — Other Ambulatory Visit: Payer: Self-pay

## 2015-12-23 ENCOUNTER — Ambulatory Visit (INDEPENDENT_AMBULATORY_CARE_PROVIDER_SITE_OTHER): Payer: Medicare Other | Admitting: Family Medicine

## 2015-12-23 DIAGNOSIS — E119 Type 2 diabetes mellitus without complications: Secondary | ICD-10-CM | POA: Diagnosis not present

## 2015-12-23 DIAGNOSIS — Z23 Encounter for immunization: Secondary | ICD-10-CM | POA: Diagnosis not present

## 2015-12-23 DIAGNOSIS — I10 Essential (primary) hypertension: Secondary | ICD-10-CM | POA: Diagnosis not present

## 2015-12-23 DIAGNOSIS — E785 Hyperlipidemia, unspecified: Secondary | ICD-10-CM | POA: Diagnosis not present

## 2015-12-23 DIAGNOSIS — E039 Hypothyroidism, unspecified: Secondary | ICD-10-CM | POA: Diagnosis not present

## 2015-12-23 NOTE — Progress Notes (Signed)
Patient ID: Nicole Young, female   DOB: [birth year], 75 y.o.   MRN: [id number] Here for Pneumovax 23 per CMA only.

## 2015-12-24 LAB — CBC WITH DIFFERENTIAL/PLATELET
BASOS ABS: 0 10*3/uL (ref 0.0–0.2)
BASOS: 1 %
EOS (ABSOLUTE): 0.2 10*3/uL (ref 0.0–0.4)
Eos: 2 %
HEMOGLOBIN: 13.1 g/dL (ref 11.1–15.9)
Hematocrit: 38.8 % (ref 34.0–46.6)
IMMATURE GRANS (ABS): 0 10*3/uL (ref 0.0–0.1)
Immature Granulocytes: 0 %
LYMPHS: 32 %
Lymphocytes Absolute: 2.6 10*3/uL (ref 0.7–3.1)
MCH: 29.2 pg (ref 26.6–33.0)
MCHC: 33.8 g/dL (ref 31.5–35.7)
MCV: 86 fL (ref 79–97)
MONOCYTES: 7 %
Monocytes Absolute: 0.5 10*3/uL (ref 0.1–0.9)
NEUTROS ABS: 4.8 10*3/uL (ref 1.4–7.0)
Neutrophils: 58 %
Platelets: 236 10*3/uL (ref 150–379)
RBC: 4.49 x10E6/uL (ref 3.77–5.28)
RDW: 15.2 % (ref 12.3–15.4)
WBC: 8.2 10*3/uL (ref 3.4–10.8)

## 2015-12-24 LAB — COMPREHENSIVE METABOLIC PANEL
ALBUMIN: 4.6 g/dL (ref 3.5–4.8)
ALT: 26 IU/L (ref 0–32)
AST: 23 IU/L (ref 0–40)
Albumin/Globulin Ratio: 2.2 (ref 1.2–2.2)
Alkaline Phosphatase: 49 IU/L (ref 39–117)
BILIRUBIN TOTAL: 0.4 mg/dL (ref 0.0–1.2)
BUN / CREAT RATIO: 18 (ref 12–28)
BUN: 14 mg/dL (ref 8–27)
CALCIUM: 9.4 mg/dL (ref 8.7–10.3)
CHLORIDE: 102 mmol/L (ref 96–106)
CO2: 22 mmol/L (ref 18–29)
CREATININE: 0.79 mg/dL (ref 0.57–1.00)
GFR, EST AFRICAN AMERICAN: 84 mL/min/{1.73_m2} (ref >59)
GFR, EST NON AFRICAN AMERICAN: 73 mL/min/{1.73_m2} (ref >59)
GLUCOSE: 131 mg/dL — AB (ref 65–99)
Globulin, Total: 2.1 g/dL (ref 1.5–4.5)
Potassium: 4.5 mmol/L (ref 3.5–5.2)
Sodium: 141 mmol/L (ref 134–144)
TOTAL PROTEIN: 6.7 g/dL (ref 6.0–8.5)

## 2015-12-24 LAB — TSH: TSH: 1.66 u[IU]/mL (ref 0.450–4.500)

## 2015-12-24 LAB — LIPID PANEL WITH LDL/HDL RATIO
Cholesterol, Total: 151 mg/dL (ref 100–199)
HDL: 61 mg/dL (ref >39)
LDL CALC: 56 mg/dL (ref 0–99)
LDL/HDL RATIO: 0.9 ratio (ref 0.0–3.2)
TRIGLYCERIDES: 169 mg/dL — AB (ref 0–149)
VLDL CHOLESTEROL CAL: 34 mg/dL (ref 5–40)

## 2015-12-24 LAB — HEMOGLOBIN A1C
Est. average glucose Bld gHb Est-mCnc: 148 mg/dL
Hgb A1c MFr Bld: 6.8 % — ABNORMAL HIGH (ref 4.8–5.6)

## 2016-03-31 LAB — HM DIABETES EYE EXAM

## 2016-04-20 ENCOUNTER — Ambulatory Visit (INDEPENDENT_AMBULATORY_CARE_PROVIDER_SITE_OTHER): Payer: Medicare Other | Admitting: Family Medicine

## 2016-04-20 ENCOUNTER — Encounter: Payer: Self-pay | Admitting: Family Medicine

## 2016-04-20 VITALS — BP 120/62 | HR 80 | Temp 97.8°F | Resp 16 | Wt 151.0 lb

## 2016-04-20 DIAGNOSIS — B373 Candidiasis of vulva and vagina: Secondary | ICD-10-CM

## 2016-04-20 DIAGNOSIS — F320 Major depressive disorder, single episode, mild: Secondary | ICD-10-CM | POA: Diagnosis not present

## 2016-04-20 DIAGNOSIS — R519 Headache, unspecified: Secondary | ICD-10-CM

## 2016-04-20 DIAGNOSIS — E119 Type 2 diabetes mellitus without complications: Secondary | ICD-10-CM

## 2016-04-20 DIAGNOSIS — B3731 Acute candidiasis of vulva and vagina: Secondary | ICD-10-CM

## 2016-04-20 DIAGNOSIS — R51 Headache: Secondary | ICD-10-CM

## 2016-04-20 DIAGNOSIS — R5383 Other fatigue: Secondary | ICD-10-CM

## 2016-04-20 DIAGNOSIS — G2581 Restless legs syndrome: Secondary | ICD-10-CM

## 2016-04-20 DIAGNOSIS — K219 Gastro-esophageal reflux disease without esophagitis: Secondary | ICD-10-CM | POA: Diagnosis not present

## 2016-04-20 DIAGNOSIS — I10 Essential (primary) hypertension: Secondary | ICD-10-CM

## 2016-04-20 LAB — POCT GLYCOSYLATED HEMOGLOBIN (HGB A1C)
Est. average glucose Bld gHb Est-mCnc: 143
HEMOGLOBIN A1C: 6.6

## 2016-04-20 MED ORDER — TERCONAZOLE 0.8 % VA CREA: 1.0000 | TOPICAL_CREAM | Freq: Every day | VAGINAL | 0 refills | Status: DC

## 2016-04-20 MED ORDER — DIAZEPAM 5 MG PO TABS: ORAL_TABLET | ORAL | 1 refills | Status: DC

## 2016-04-20 NOTE — Progress Notes (Signed)
Patient: Nicole Young Female    DOB: May 09, 1940   76 y.o.   MRN: [id number] Visit Date: 04/20/2016  Today's Provider: [name], MD   Chief Complaint  Patient presents with  . Diabetes  . Hypertension   Subjective:    HPI      Diabetes Mellitus Type II, Follow-up:   Lab Results  Component Value Date   HGBA1C 6.6 04/20/2016   HGBA1C 6.8 (H) 12/23/2015   HGBA1C 6.7 12/07/2015    Last seen for diabetes 4 months ago.  Management since then includes no changes. She reports excellent compliance with treatment. She is not having side effects.  Current symptoms include nausea and have been unchanged. Pt reports she has very mild nausea every morning when she drinks a glass of water. Home blood sugar records: fasting range: 112-130  Episodes of hypoglycemia? no   Current Insulin Regimen: N/A Most Recent Eye Exam: March 2018 Weight trend: stable Prior visit with dietician: yes - when pt was first diagnosed with DM. Current diet: in general, a "healthy" diet   Current exercise: some. Pt stopped walking about 2 weeks ago because her walking partner had a heart attack. Pt also lifts weights at home.  Pertinent Labs:    Component Value Date/Time   CHOL 151 12/23/2015 0801   TRIG 169 (H) 12/23/2015 0801   HDL 61 12/23/2015 0801   LDLCALC 56 12/23/2015 0801   CREATININE 0.79 12/23/2015 0801    Wt Readings from Last 3 Encounters:  04/20/16 151 lb (68.5 kg)  12/16/15 149 lb (67.6 kg)  12/16/15 149 lb 6 oz (67.8 kg)    ------------------------------------------------------------------------  Hypertension, follow-up:  BP Readings from Last 3 Encounters:  04/20/16 120/62  12/16/15 122/63  12/16/15 122/63    She was last seen for hypertension 4 months ago.  BP at that visit was 122/63. Management since that visit includes none. She reports excellent compliance with treatment. She is not having side effects.  She is exercising. She is adherent  to low salt diet.   Outside blood pressures are not being checked. She is experiencing none.  Patient denies chest pain, chest pressure/discomfort, claudication, dyspnea, exertional chest pressure/discomfort, fatigue, irregular heart beat, lower extremity edema, near-syncope, orthopnea, palpitations and syncope.   Cardiovascular risk factors include advanced age (older than 82 for men, 59 for women), diabetes mellitus and hypertension.    Weight trend: stable Wt Readings from Last 3 Encounters:  04/20/16 151 lb (68.5 kg)  12/16/15 149 lb (67.6 kg)  12/16/15 149 lb 6 oz (67.8 kg)    Current diet: in general, a "healthy" diet    ------------------------------------------------------------------------ Pt is requesting a refill of Valium and terconazole. Pt reports her vaginal yeast infection sx are returning.  Allergies  Allergen Reactions  . Neomycin-Polymyxin-Gramicidin Other (See Comments)    Other Reaction: Other reaction  . Wilder Glade [Dapagliflozin] Other (See Comments)    Weak and dizzy   . Neosporin [Neomycin-Bacitracin Zn-Polymyx] Swelling  . Thimerosal Swelling  . Zostavax  [Zoster Vaccine Live] Rash    Do Not Administer Zostavax to Pt!     Current Outpatient Prescriptions:  .  ascorbic acid (VITAMIN C) 100 MG tablet, Take 500 mg by mouth 2 (two) times daily. , Disp: , Rfl:  .  azelastine (ASTELIN) 0.1 % nasal spray, Reported on 06/21/2015, Disp: , Rfl:  .  Cholecalciferol 400 UNITS CAPS, Take by mouth., Disp: , Rfl:  .  diazepam (VALIUM) 5  MG tablet, TAKE ONE TABLET BY MOUTH NIGHTLY AT BEDTIME AS NEEDED, Disp: 30 tablet, Rfl: 1 .  glucose blood (ACCU-CHEK SMARTVIEW) test strip, ACCU-CHEK SMARTVIEW (In Vitro Strip)  1 (one) Strip Strip check sugar once daily for 0 days  Quantity: 50;  Refills: 12   Ordered :01-Dec-2013  Miguel Aschoff MD;  Buddy Duty [phone] Active Comments: Diagnosis code E11.9, Disp: , Rfl:  .  levothyroxine (SYNTHROID, LEVOTHROID) 75 MCG tablet, TAKE 1  TABLET (75 MCG TOTAL) BY MOUTH DAILY. NEED APPOINTMENT FOR MORE REFILLS, Disp: 30 tablet, Rfl: 11 .  Magnesium 250 MG TABS, Take 1 tablet by mouth daily., Disp: , Rfl:  .  metFORMIN (GLUCOPHAGE-XR) 500 MG 24 hr tablet, Take 500 mg by mouth 2 (two) times daily. , Disp: , Rfl:  .  Multiple Vitamin (MULTIVITAMIN) capsule, Take 1 capsule by mouth daily., Disp: , Rfl:  .  pantoprazole (PROTONIX) 40 MG tablet, , Disp: , Rfl:  .  rosuvastatin (CRESTOR) 10 MG tablet, Take 1 tablet (10 mg total) by mouth at bedtime., Disp: 90 tablet, Rfl: 3 .  sertraline (ZOLOFT) 100 MG tablet, Take 1 tablet (100 mg total) by mouth daily., Disp: 90 tablet, Rfl: 3 .  ranitidine (ZANTAC) 150 MG tablet, Take 1 tablet (150 mg total) by mouth 2 (two) times daily. (Patient not taking: Reported on 04/20/2016), Disp: 180 tablet, Rfl: 3 .  terconazole (TERAZOL 3) 0.8 % vaginal cream, Place 1 applicator vaginally at bedtime., Disp: 20 g, Rfl: 0  Review of Systems  Constitutional: Positive for fatigue. Negative for activity change, appetite change, chills, diaphoresis, fever and unexpected weight change.  HENT: Negative.   Eyes: Negative.   Respiratory: Negative for shortness of breath.   Cardiovascular: Negative for chest pain, palpitations and leg swelling.  Gastrointestinal: Negative.   Endocrine: Negative for polydipsia, polyphagia and polyuria.  Allergic/Immunologic: Negative.   Neurological: Negative.   Psychiatric/Behavioral: Negative.     Social History  Substance Use Topics  . Smoking status: Never Smoker  . Smokeless tobacco: Never Used  . Alcohol use 1.2 - 1.8 oz/week    2 - 3 Glasses of wine per week   Objective:   BP 120/62 (BP Location: Right Arm, Patient Position: Sitting, Cuff Size: Normal)   Pulse 80   Temp 97.8 F (36.6 C) (Oral)   Resp 16   Wt 151 lb (68.5 kg)   BMI 29.49 kg/m  Vitals:   04/20/16 1120  BP: 120/62  Pulse: 80  Resp: 16  Temp: 97.8 F (36.6 C)  TempSrc: Oral  Weight: 151 lb  (68.5 kg)     Physical Exam  Constitutional: She is oriented to person, place, and time. She appears well-developed and well-nourished.  HENT:  Head: Normocephalic and atraumatic.  Right Ear: External ear normal.  Nose: Nose normal.  Eyes: Conjunctivae are normal.  Neck: Normal range of motion.  Cardiovascular: Normal rate, regular rhythm and normal heart sounds.   Pulmonary/Chest: Effort normal and breath sounds normal. No respiratory distress.  Abdominal: Soft.  Musculoskeletal: She exhibits no edema.  Neurological: She is alert and oriented to person, place, and time.  Skin: Skin is warm and dry.  Psychiatric: She has a normal mood and affect. Her behavior is normal. Judgment and thought content normal.        Assessment & Plan:     1. Type 2 diabetes mellitus without complication, without long-term current use of insulin (HCC) Stable. Continue current medications. - POCT glycosylated hemoglobin (Hb A1C) Results  for orders placed or performed in visit on 04/20/16  POCT glycosylated hemoglobin (Hb A1C)  Result Value Ref Range   Hemoglobin A1C 6.6    Est. average glucose Bld gHb Est-mCnc 143      2. Monilial vaginitis Refills provided. Advised pt she will need to be seen for evaluation of these return after this treatment. - terconazole (TERAZOL 3) 0.8 % vaginal cream; Place 1 applicator vaginally at bedtime.  Dispense: 20 g; Refill: 0  3. Restless leg Pt only takes 1/2 tablet of valium about 2 times per week. Refills provided. - diazepam (VALIUM) 5 MG tablet; TAKE ONE TABLET BY MOUTH NIGHTLY AT BEDTIME AS NEEDED  Dispense: 30 tablet; Refill: 1  4. Essential (primary) hypertension Stable. Continue current medications.  5. Other fatigue "Sleepiness". Question sleep apnea vs diazepam use. Pt admits to snoring.  6. Mild major depression (HCC) Improved on new dose of sertraline. Depression screen [facility] 2/9 04/20/2016 12/16/2015 05/17/2015  Decreased Interest 0 0 0  Down,  Depressed, Hopeless 0 0 0  PHQ - 2 Score 0 0 0  Altered sleeping 2 - -  Tired, decreased energy 1 - -  Change in appetite 0 - -  Feeling bad or failure about yourself  0 - -  Trouble concentrating 0 - -  Moving slowly or fidgety/restless 0 - -  Suicidal thoughts 0 - -  PHQ-9 Score 3 - -  Difficult doing work/chores Not difficult at all - -    7. Nonintractable episodic headache, unspecified headache type Will check sed rate. FU pending results. - Sedimentation rate  8. Gastroesophageal reflux disease, esophagitis presence not specified Is on PPI. Will check magnesium level. - Magnesium     Patient seen and examined by Miguel Aschoff, MD, and note scribed by Renaldo Fiddler, CMA.  Jessee Mezera Cranford Mon, MD  [facility]

## 2016-04-24 DIAGNOSIS — R51 Headache: Secondary | ICD-10-CM | POA: Diagnosis not present

## 2016-04-24 DIAGNOSIS — K219 Gastro-esophageal reflux disease without esophagitis: Secondary | ICD-10-CM | POA: Diagnosis not present

## 2016-04-25 LAB — SEDIMENTATION RATE: Sed Rate: 8 mm/hr (ref 0–40)

## 2016-04-25 LAB — MAGNESIUM: MAGNESIUM: 2 mg/dL (ref 1.6–2.3)

## 2016-04-25 NOTE — Progress Notes (Signed)
Advised  ED 

## 2016-05-09 ENCOUNTER — Other Ambulatory Visit: Payer: Self-pay | Admitting: Family Medicine

## 2016-05-09 DIAGNOSIS — Z1231 Encounter for screening mammogram for malignant neoplasm of breast: Secondary | ICD-10-CM

## 2016-05-24 ENCOUNTER — Other Ambulatory Visit: Payer: Self-pay | Admitting: Family Medicine

## 2016-05-24 ENCOUNTER — Ambulatory Visit
Admission: RE | Admit: 2016-05-24 | Discharge: 2016-05-24 | Disposition: A | Discharge status: Home / Self Care | Payer: Medicare Other | Source: Ambulatory Visit | Attending: Family Medicine | Admitting: Family Medicine

## 2016-05-24 DIAGNOSIS — Z1231 Encounter for screening mammogram for malignant neoplasm of breast: Secondary | ICD-10-CM

## 2016-05-25 ENCOUNTER — Inpatient Hospital Stay
Admission: RE | Admit: 2016-05-25 | Discharge: 2016-05-25 | Disposition: A | Discharge status: Home / Self Care | Payer: Self-pay | Source: Ambulatory Visit | Attending: *Deleted | Admitting: *Deleted

## 2016-05-25 ENCOUNTER — Other Ambulatory Visit: Payer: Self-pay | Admitting: *Deleted

## 2016-05-25 DIAGNOSIS — Z9289 Personal history of other medical treatment: Secondary | ICD-10-CM

## 2016-06-08 ENCOUNTER — Telehealth: Payer: Self-pay | Admitting: Family Medicine

## 2016-06-08 ENCOUNTER — Other Ambulatory Visit: Payer: Self-pay | Admitting: Family Medicine

## 2016-06-08 MED ORDER — METFORMIN HCL ER (MOD) 1000 MG PO TB24: 1000.0000 mg | ORAL_TABLET | Freq: Every day | ORAL | 3 refills | Status: DC

## 2016-06-08 NOTE — Telephone Encounter (Signed)
Refill given-aa

## 2016-06-08 NOTE — Telephone Encounter (Signed)
Pt needs refill on her   metFORMIN (GLUCOPHAGE-XR) 500 MG 24 hr tablet 2 pills once a day.  1000mg   Pt know Dr. Rosanna Randy is out until the 11th   [facility]  Thanks teri

## 2016-06-08 NOTE — Telephone Encounter (Signed)
Error/MW °

## 2016-06-09 ENCOUNTER — Other Ambulatory Visit: Payer: Self-pay

## 2016-06-09 MED ORDER — METFORMIN HCL ER 500 MG PO TB24: 500.0000 mg | ORAL_TABLET | Freq: Two times a day (BID) | ORAL | 3 refills | Status: DC

## 2016-07-03 ENCOUNTER — Other Ambulatory Visit: Payer: Self-pay | Admitting: Family Medicine

## 2016-07-06 DIAGNOSIS — M419 Scoliosis, unspecified: Secondary | ICD-10-CM | POA: Insufficient documentation

## 2016-07-06 DIAGNOSIS — M47812 Spondylosis without myelopathy or radiculopathy, cervical region: Secondary | ICD-10-CM | POA: Insufficient documentation

## 2016-07-06 DIAGNOSIS — M5136 Other intervertebral disc degeneration, lumbar region: Secondary | ICD-10-CM | POA: Insufficient documentation

## 2016-07-06 DIAGNOSIS — M4692 Unspecified inflammatory spondylopathy, cervical region: Secondary | ICD-10-CM | POA: Diagnosis not present

## 2016-07-06 DIAGNOSIS — M4186 Other forms of scoliosis, lumbar region: Secondary | ICD-10-CM | POA: Diagnosis not present

## 2016-07-06 HISTORY — DX: Spondylosis without myelopathy or radiculopathy, cervical region: M47.812

## 2016-07-27 DIAGNOSIS — M4186 Other forms of scoliosis, lumbar region: Secondary | ICD-10-CM | POA: Diagnosis not present

## 2016-07-27 DIAGNOSIS — M5136 Other intervertebral disc degeneration, lumbar region: Secondary | ICD-10-CM | POA: Diagnosis not present

## 2016-07-31 DIAGNOSIS — M5136 Other intervertebral disc degeneration, lumbar region: Secondary | ICD-10-CM | POA: Diagnosis not present

## 2016-07-31 DIAGNOSIS — M545 Low back pain: Secondary | ICD-10-CM | POA: Diagnosis not present

## 2016-08-28 DIAGNOSIS — M545 Low back pain: Secondary | ICD-10-CM | POA: Diagnosis not present

## 2016-08-28 DIAGNOSIS — M5136 Other intervertebral disc degeneration, lumbar region: Secondary | ICD-10-CM | POA: Diagnosis not present

## 2016-09-01 ENCOUNTER — Other Ambulatory Visit: Payer: Self-pay | Admitting: Family Medicine

## 2016-09-27 DIAGNOSIS — M436 Torticollis: Secondary | ICD-10-CM | POA: Diagnosis not present

## 2016-09-27 DIAGNOSIS — M542 Cervicalgia: Secondary | ICD-10-CM | POA: Diagnosis not present

## 2016-10-03 DIAGNOSIS — M436 Torticollis: Secondary | ICD-10-CM | POA: Diagnosis not present

## 2016-10-03 DIAGNOSIS — M542 Cervicalgia: Secondary | ICD-10-CM | POA: Diagnosis not present

## 2016-10-10 DIAGNOSIS — S92301A Fracture of unspecified metatarsal bone(s), right foot, initial encounter for closed fracture: Secondary | ICD-10-CM | POA: Diagnosis not present

## 2016-10-10 DIAGNOSIS — M25521 Pain in right elbow: Secondary | ICD-10-CM | POA: Diagnosis not present

## 2016-10-17 ENCOUNTER — Ambulatory Visit (INDEPENDENT_AMBULATORY_CARE_PROVIDER_SITE_OTHER): Payer: Medicare Other | Admitting: Family Medicine

## 2016-10-17 VITALS — BP 110/62 | HR 84 | Temp 97.4°F | Resp 14 | Wt 147.4 lb

## 2016-10-17 DIAGNOSIS — E119 Type 2 diabetes mellitus without complications: Secondary | ICD-10-CM

## 2016-10-17 DIAGNOSIS — F320 Major depressive disorder, single episode, mild: Secondary | ICD-10-CM | POA: Diagnosis not present

## 2016-10-17 DIAGNOSIS — K219 Gastro-esophageal reflux disease without esophagitis: Secondary | ICD-10-CM | POA: Diagnosis not present

## 2016-10-17 DIAGNOSIS — S92811D Other fracture of right foot, subsequent encounter for fracture with routine healing: Secondary | ICD-10-CM | POA: Diagnosis not present

## 2016-10-17 DIAGNOSIS — E7849 Other hyperlipidemia: Secondary | ICD-10-CM | POA: Diagnosis not present

## 2016-10-17 DIAGNOSIS — G2581 Restless legs syndrome: Secondary | ICD-10-CM

## 2016-10-17 DIAGNOSIS — Z2821 Immunization not carried out because of patient refusal: Secondary | ICD-10-CM | POA: Diagnosis not present

## 2016-10-17 DIAGNOSIS — I10 Essential (primary) hypertension: Secondary | ICD-10-CM

## 2016-10-17 LAB — POCT UA - MICROALBUMIN: MICROALBUMIN (UR) POC: 20 mg/L

## 2016-10-17 LAB — POCT GLYCOSYLATED HEMOGLOBIN (HGB A1C): HEMOGLOBIN A1C: 6.5

## 2016-10-17 MED ORDER — PANTOPRAZOLE SODIUM 20 MG PO TBEC: 20.0000 mg | DELAYED_RELEASE_TABLET | Freq: Every day | ORAL | 12 refills | Status: DC

## 2016-10-17 NOTE — Progress Notes (Signed)
Nicole Young  MRN: [id number] DOB: 14-Feb-1940  Subjective:  HPI   Patient is here for follow up. Last office visit was on 04/20/16. Routine lab work was done on 12/23/15.   DM: patient is checking her sugar fasting readings have been 114-140, and sometimes 2 hours after she eats. Patient is on metformin. She did have to have a course of Prednisone this summer and that effected her sugar readings too.  Lab Results  Component Value Date   HGBA1C 6.6 04/20/2016   HTN: patient has checked her b/p sometimes and the last few readings have been 132/75, higher than usual. BP Readings from Last 3 Encounters:  10/17/16 110/62  04/20/16 120/62  12/16/15 122/63  Weight: 147 lb 6.4 oz (66.9 kg)  Wt Readings from Last 3 Encounters:  10/17/16 147 lb 6.4 oz (66.9 kg)  04/20/16 151 lb (68.5 kg)  12/16/15 149 lb (67.6 kg)   GERD: patient is on Pantoprazole. She tried taking Ranitidine but this did not help her symptoms so she is taking Pantoprazole and this controls her symptoms.  Depression: symptoms are well on Sertraline 100 mg. Depression screen [facility] 2/9 10/17/2016 04/20/2016 12/16/2015  Decreased Interest 0 0 0  Down, Depressed, Hopeless 0 0 0  PHQ - 2 Score 0 0 0  Altered sleeping 0 2 -  Tired, decreased energy 0 1 -  Change in appetite 0 0 -  Feeling bad or failure about yourself  0 0 -  Trouble concentrating 0 0 -  Moving slowly or fidgety/restless 0 0 -  Suicidal thoughts 0 0 -  PHQ-9 Score 0 3 -  Difficult doing work/chores Not difficult at all Not difficult at all -   Patient Active Problem List   Diagnosis Date Noted  . Arthralgia of multiple joints 11/12/2014  . Allergic rhinitis 11/12/2014  . Diabetes mellitus ([place]) 11/12/2014  . Essential (primary) hypertension 11/12/2014  . Personal history of other drug therapy 11/12/2014  . HLD (hyperlipidemia) 11/12/2014  . Adult hypothyroidism 11/12/2014  . Mild major depression ([place]) 11/12/2014  . Overweight 11/12/2014  .  Restless leg 11/12/2014  . Encounter for screening colonoscopy 04/17/2014  . Type 2 diabetes mellitus ([place]) 12/23/2013    Past Medical History:  Diagnosis Date  . Complication of anesthesia    slow to wake up in past  . Depression    controlled  . Diabetes mellitus without complication ([place])    controlled  . Hypothyroidism   . Neuropathy   . Osteoarthritis    knee, ankles, shoulders, back, neck, hands/fingers, wrists;   . Rectocele   . Thyroid disease     Social History   Social History  . Marital status: Widowed    Spouse name: N/A  . Number of children: N/A  . Years of education: N/A   Occupational History  . Not on file.   Social History Main Topics  . Smoking status: Never Smoker  . Smokeless tobacco: Never Used  . Alcohol use 1.2 - 1.8 oz/week    2 - 3 Glasses of wine per week  . Drug use: No  . Sexual activity: Not on file   Other Topics Concern  . Not on file   Social History Narrative  . No narrative on file    Outpatient Encounter Prescriptions as of 10/17/2016  Medication Sig Note  . ascorbic acid (VITAMIN C) 100 MG tablet Take 500 mg by mouth 2 (two) times daily.  11/12/2014: rarely Received from: [facility]  . azelastine (ASTELIN) 0.1 % nasal spray Reported on 06/21/2015 06/17/2015: Received from: External Pharmacy  . Cholecalciferol 400 UNITS CAPS Take by mouth. 11/12/2014: Received from: [facility]  . diazepam (VALIUM) 5 MG tablet TAKE ONE TABLET BY MOUTH NIGHTLY AT BEDTIME AS NEEDED   . glucose blood (ACCU-CHEK SMARTVIEW) test strip ACCU-CHEK SMARTVIEW (In Vitro Strip)  1 (one) Strip Strip check sugar once daily for 0 days  Quantity: 50;  Refills: 12   Ordered :01-Dec-2013  Miguel Aschoff MD;  Started 01-Dec-2013 Active Comments: Diagnosis code E11.9 11/12/2014: Diagnosis code E11.9 Received from: [facility]  . levothyroxine (SYNTHROID, LEVOTHROID) 75 MCG tablet TAKE 1 TABLET EVERY DAY   .  Magnesium 250 MG TABS Take 1 tablet by mouth daily.   . metFORMIN (GLUCOPHAGE-XR) 500 MG 24 hr tablet Take 1 tablet (500 mg total) by mouth 2 (two) times daily.   . Multiple Vitamin (MULTIVITAMIN) capsule Take 1 capsule by mouth daily.   . pantoprazole (PROTONIX) 40 MG tablet TAKE 1 TABLET (40 MG TOTAL) BY MOUTH DAILY.   . rosuvastatin (CRESTOR) 10 MG tablet Take 1 tablet (10 mg total) by mouth at bedtime.   . sertraline (ZOLOFT) 100 MG tablet Take 1 tablet (100 mg total) by mouth daily.   . [DISCONTINUED] pantoprazole (PROTONIX) 40 MG tablet  12/22/2015: Received from: External Pharmacy  . [DISCONTINUED] ranitidine (ZANTAC) 150 MG tablet Take 1 tablet (150 mg total) by mouth 2 (two) times daily. (Patient not taking: Reported on 04/20/2016)   . [DISCONTINUED] terconazole (TERAZOL 3) 0.8 % vaginal cream Place 1 applicator vaginally at bedtime.    No facility-administered encounter medications on file as of 10/17/2016.     Allergies  Allergen Reactions  . Neomycin-Polymyxin-Gramicidin Other (See Comments)    Other Reaction: Other reaction  . Wilder Glade [Dapagliflozin] Other (See Comments)    Weak and dizzy   . Neosporin [Neomycin-Bacitracin Zn-Polymyx] Swelling  . Thimerosal Swelling  . Zostavax  [Zoster Vaccine Live] Rash    Do Not Administer Zostavax to Pt!    Review of Systems  Constitutional: Negative.   Eyes: Negative.   Respiratory: Positive for shortness of breath. Negative for cough and sputum production.   Cardiovascular: Negative.   Gastrointestinal: Negative.        Controlled on the medication  Musculoskeletal: Positive for back pain, falls, joint pain and neck pain.  Skin: Negative.   Neurological: Negative.   Endo/Heme/Allergies: Negative.   Psychiatric/Behavioral: Negative.        Better on the medication.    Objective:  BP 110/62   Pulse 84   Temp (!) 97.4 F (36.3 C)   Resp 14   Wt 147 lb 6.4 oz (66.9 kg)   BMI 28.79 kg/m   Physical Exam  Constitutional:  She is oriented to person, place, and time and well-developed, well-nourished, and in no distress.  HENT:  Head: Normocephalic and atraumatic.  Eyes: Pupils are equal, round, and reactive to light. Conjunctivae are normal.  Neck: Normal range of motion. Neck supple.  Cardiovascular: Normal rate, regular rhythm, normal heart sounds and intact distal pulses.  Exam reveals no gallop.   No murmur heard. Pulmonary/Chest: Effort normal. No respiratory distress. She has no wheezes.  Abdominal: Soft.  Musculoskeletal: She exhibits no edema or tenderness.  Neurological: She is alert and oriented to person, place, and time. Gait normal. Coordination normal. GCS score is 15.  Skin: Skin is warm and dry.  Psychiatric: Mood, memory, affect and  judgment normal.   Diabetic Foot Exam - Simple   Simple Foot Form Diabetic Foot exam was performed with the following findings:  Yes 10/17/2016  4:12 PM  Visual Inspection No deformities, no ulcerations, no other skin breakdown bilaterally:  Yes Sensation Testing Intact to touch and monofilament testing bilaterally:  Yes Pulse Check Posterior Tibialis and Dorsalis pulse intact bilaterally:  Yes Comments     Assessment and Plan :  1. Type 2 diabetes mellitus without complication, without long-term current use of insulin (HCC) 6.5. Stable. Continue working on habits. Continue current medication. Discussed Jardiance as possible treatment but patient did not tolerate [place]. Continue Metformin at this time and follow. - POCT HgB A1C - POCT UA - Microalbumin-20.  2. Restless leg Stable.  3. Essential (primary) hypertension Stable. Continue current medication.  4. Mild major depression (HCC) Stable on current medication. PHQ 9 score 0.  5. Gastroesophageal reflux disease, esophagitis presence not specified Controlled on Pantoprazole. Patient wants to just take 20 mg daily only and then follow. May be able to cut back on this medication more in the  future.  6. Other hyperlipidemia Discussed with patient the benefit with taking Zetia vs statin she is on. Will get lab work checked and will make a decision after that if changes need to be made.  7. Closed fracture of sesamoid bone of right foot with routine healing, subsequent encounter Patient has appointment to see specialist for second opinion.  8. Influenza vaccination declined Patient wants to get this done at [facility].  HPI, Exam and A&P transcribed by Tiffany Kocher, RMA under direction and in the presence of Miguel Aschoff, MD. I have done the exam and reviewed the chart and it is accurate to the best of my knowledge. Development worker, community has been used and  any errors in dictation or transcription are unintentional. Miguel Aschoff M.D. [facility]

## 2016-10-18 ENCOUNTER — Encounter: Payer: Self-pay | Admitting: Podiatry

## 2016-10-18 ENCOUNTER — Ambulatory Visit (INDEPENDENT_AMBULATORY_CARE_PROVIDER_SITE_OTHER): Payer: Medicare Other

## 2016-10-18 ENCOUNTER — Ambulatory Visit (INDEPENDENT_AMBULATORY_CARE_PROVIDER_SITE_OTHER): Payer: Medicare Other | Admitting: Podiatry

## 2016-10-18 DIAGNOSIS — S92811A Other fracture of right foot, initial encounter for closed fracture: Secondary | ICD-10-CM

## 2016-10-18 DIAGNOSIS — M7751 Other enthesopathy of right foot: Secondary | ICD-10-CM | POA: Diagnosis not present

## 2016-10-18 NOTE — Progress Notes (Signed)
Subjective:  Patient ID: Nicole Young, female    DOB: [birth year],  MRN: [id number] HPI Chief Complaint  Patient presents with  . Foot Injury    Sub 1st MPJ right - patient [place] lace got caught in it and it threw her down and pulled her shoe off her foot, she went to orthopedist for evaluation of elbow and they also xrayed her foot-said she had fractured sesamoid, but no other treatment was done - wanted a second opinion due to continued pain, redness and swelling    76 y.o. female presents with the above complaint.     Past Medical History:  Diagnosis Date  . Complication of anesthesia    slow to wake up in past  . Depression    controlled  . Diabetes mellitus without complication ([place])    controlled  . Hypothyroidism   . Neuropathy   . Osteoarthritis    knee, ankles, shoulders, back, neck, hands/fingers, wrists;   . Rectocele   . Thyroid disease    Past Surgical History:  Procedure Laterality Date  . AUGMENTATION MAMMAPLASTY    . BREAST SURGERY     impants replaced with lift  . CARPAL TUNNEL RELEASE Right 08/26/2014   Procedure: CARPAL TUNNEL RELEASE;  Surgeon: Earnestine Leys, MD;  Location: [facility] ORS;  Service: Orthopedics;  Laterality: Right;  . COLONOSCOPY  2005   Dr Bary Castilla  . COLONOSCOPY N/A 06/24/2014   Procedure: COLONOSCOPY;  Surgeon: Robert Bellow, MD;  Location: [facility] ENDOSCOPY;  Service: Endoscopy;  Laterality: N/A;  . PARATHYROIDECTOMY  1992  . PLACEMENT OF BREAST IMPLANTS  1975  . TONSILECTOMY, ADENOIDECTOMY, BILATERAL MYRINGOTOMY AND TUBES      Current Outpatient Prescriptions:  .  ascorbic acid (VITAMIN C) 100 MG tablet, Take 500 mg by mouth 2 (two) times daily. , Disp: , Rfl:  .  azelastine (ASTELIN) 0.1 % nasal spray, Reported on 06/21/2015, Disp: , Rfl:  .  Cholecalciferol 400 UNITS CAPS, Take by mouth., Disp: , Rfl:  .  diazepam (VALIUM) 5 MG tablet, TAKE ONE TABLET BY MOUTH NIGHTLY AT  BEDTIME AS NEEDED, Disp: 30 tablet, Rfl: 1 .  glucose blood (ACCU-CHEK SMARTVIEW) test strip, ACCU-CHEK SMARTVIEW (In Vitro Strip)  1 (one) Strip Strip check sugar once daily for 0 days  Quantity: 50;  Refills: 12   Ordered :01-Dec-2013  Miguel Aschoff MD;  Buddy Duty [phone] Active Comments: Diagnosis code E11.9, Disp: , Rfl:  .  levothyroxine (SYNTHROID, LEVOTHROID) 75 MCG tablet, TAKE 1 TABLET EVERY DAY, Disp: 30 tablet, Rfl: 11 .  Magnesium 250 MG TABS, Take 1 tablet by mouth daily., Disp: , Rfl:  .  metFORMIN (GLUCOPHAGE-XR) 500 MG 24 hr tablet, Take 1 tablet (500 mg total) by mouth 2 (two) times daily., Disp: 180 tablet, Rfl: 3 .  Multiple Vitamin (MULTIVITAMIN) capsule, Take 1 capsule by mouth daily., Disp: , Rfl:  .  pantoprazole (PROTONIX) 20 MG tablet, Take 1 tablet (20 mg total) by mouth daily., Disp: 30 tablet, Rfl: 12 .  rosuvastatin (CRESTOR) 10 MG tablet, Take 1 tablet (10 mg total) by mouth at bedtime., Disp: 90 tablet, Rfl: 3 .  sertraline (ZOLOFT) 100 MG tablet, Take 1 tablet (100 mg total) by mouth daily., Disp: 90 tablet, Rfl: 3  Allergies  Allergen Reactions  . Neomycin-Polymyxin-Gramicidin Other (See Comments)    Other Reaction: Other reaction  . Wilder Glade [Dapagliflozin] Other (See Comments)    Weak and  dizzy   . Neosporin [Neomycin-Bacitracin Zn-Polymyx] Swelling  . Thimerosal Swelling  . Zostavax  [Zoster Vaccine Live] Rash    Do Not Administer Zostavax to Pt!   Review of Systems  Musculoskeletal: Positive for arthralgias and gait problem.  All other systems reviewed and are negative.  Objective:  There were no vitals filed for this visit.  General: Well developed, nourished, in no acute distress, alert and oriented x3   Dermatological: Skin is warm, dry and supple bilateral. Nails x 10 are well maintained; remaining integument appears unremarkable at this time. There are no open sores, no preulcerative lesions, no rash or signs of infection  present.  Vascular: Dorsalis Pedis artery and Posterior Tibial artery pedal pulses are 2/4 bilateral with immedate capillary fill time. Pedal hair growth present. No varicosities and no lower extremity edema present bilateral.   Neruologic: Grossly intact via light touch bilateral. Vibratory intact via tuning fork bilateral. Protective threshold with Semmes Wienstein monofilament intact to all pedal sites bilateral. Patellar and Achilles deep tendon reflexes 2+ bilateral. No Babinski or clonus noted bilateral.   Musculoskeletal: No gross boney pedal deformities bilateral. No pain, crepitus, or limitation noted with foot and ankle range of motion bilateral. Muscular strength 5/5 in all groups tested bilateral. First metatarsophalangeal joint of the right foot moderately tender palpation of the tibial sesamoid. Mild erythema and no ecchymosis. No open lesions abrasions or wounds.  Gait: Unassisted, Nonantalgic.    Radiographs:  Radiographs taken today demonstrates a fracture of the base of the proximal phalanx fifth digit right foot. Most significant fracture with displacement and separation is a complete transverse fracture of the tibial sesamoid right first metatarsophalangeal joint.  Assessment & Plan:   Assessment: Tibial sesamoid fracture fifth digital fracture  Plan: She had been provided a Darco shoe from the visit to the doctor who diagnosed this. However she cannot wear that. I offered her a cam boot she declined I dispensed a carbon graphite insole to prevent motion of the toe we did discuss the probable need to resect this bone.     Zakkiyya Barno T. [place], [place]

## 2016-10-23 DIAGNOSIS — E7849 Other hyperlipidemia: Secondary | ICD-10-CM | POA: Diagnosis not present

## 2016-10-23 DIAGNOSIS — I10 Essential (primary) hypertension: Secondary | ICD-10-CM | POA: Diagnosis not present

## 2016-10-23 DIAGNOSIS — K219 Gastro-esophageal reflux disease without esophagitis: Secondary | ICD-10-CM | POA: Diagnosis not present

## 2016-10-23 DIAGNOSIS — F320 Major depressive disorder, single episode, mild: Secondary | ICD-10-CM | POA: Diagnosis not present

## 2016-10-24 LAB — CBC WITH DIFFERENTIAL/PLATELET
BASOS ABS: 0.1 10*3/uL (ref 0.0–0.2)
Basos: 1 %
EOS (ABSOLUTE): 0.1 10*3/uL (ref 0.0–0.4)
Eos: 2 %
HEMOGLOBIN: 12.9 g/dL (ref 11.1–15.9)
Hematocrit: 38 % (ref 34.0–46.6)
Immature Grans (Abs): 0 10*3/uL (ref 0.0–0.1)
Immature Granulocytes: 0 %
LYMPHS ABS: 2.3 10*3/uL (ref 0.7–3.1)
Lymphs: 33 %
MCH: 29.6 pg (ref 26.6–33.0)
MCHC: 33.9 g/dL (ref 31.5–35.7)
MCV: 87 fL (ref 79–97)
MONOCYTES: 9 %
MONOS ABS: 0.6 10*3/uL (ref 0.1–0.9)
NEUTROS ABS: 3.8 10*3/uL (ref 1.4–7.0)
Neutrophils: 55 %
PLATELETS: 212 10*3/uL (ref 150–379)
RBC: 4.36 x10E6/uL (ref 3.77–5.28)
RDW: 14 % (ref 12.3–15.4)
WBC: 6.9 10*3/uL (ref 3.4–10.8)

## 2016-10-24 LAB — COMPREHENSIVE METABOLIC PANEL
A/G RATIO: 2.1 (ref 1.2–2.2)
ALBUMIN: 4.5 g/dL (ref 3.5–4.8)
ALK PHOS: 44 IU/L (ref 39–117)
ALT: 19 IU/L (ref 0–32)
AST: 20 IU/L (ref 0–40)
BILIRUBIN TOTAL: 0.2 mg/dL (ref 0.0–1.2)
BUN / CREAT RATIO: 23 (ref 12–28)
BUN: 15 mg/dL (ref 8–27)
CHLORIDE: 102 mmol/L (ref 96–106)
CO2: 20 mmol/L (ref 20–29)
Calcium: 9.4 mg/dL (ref 8.7–10.3)
Creatinine, Ser: 0.66 mg/dL (ref 0.57–1.00)
GFR calc Af Amer: 99 mL/min/{1.73_m2} (ref >59)
GFR calc non Af Amer: 85 mL/min/{1.73_m2} (ref >59)
GLOBULIN, TOTAL: 2.1 g/dL (ref 1.5–4.5)
Glucose: 143 mg/dL — ABNORMAL HIGH (ref 65–99)
POTASSIUM: 4.1 mmol/L (ref 3.5–5.2)
SODIUM: 139 mmol/L (ref 134–144)
Total Protein: 6.6 g/dL (ref 6.0–8.5)

## 2016-10-24 LAB — LIPID PANEL WITH LDL/HDL RATIO
CHOLESTEROL TOTAL: 130 mg/dL (ref 100–199)
HDL: 54 mg/dL (ref >39)
LDL Calculated: 50 mg/dL (ref 0–99)
LDl/HDL Ratio: 0.9 ratio (ref 0.0–3.2)
TRIGLYCERIDES: 128 mg/dL (ref 0–149)
VLDL Cholesterol Cal: 26 mg/dL (ref 5–40)

## 2016-10-24 LAB — TSH: TSH: 0.814 u[IU]/mL (ref 0.450–4.500)

## 2016-10-26 DIAGNOSIS — Z23 Encounter for immunization: Secondary | ICD-10-CM | POA: Diagnosis not present

## 2016-12-04 ENCOUNTER — Ambulatory Visit (INDEPENDENT_AMBULATORY_CARE_PROVIDER_SITE_OTHER): Payer: Medicare Other | Admitting: Podiatry

## 2016-12-04 ENCOUNTER — Encounter: Payer: Self-pay | Admitting: Podiatry

## 2016-12-04 ENCOUNTER — Ambulatory Visit (INDEPENDENT_AMBULATORY_CARE_PROVIDER_SITE_OTHER): Payer: Medicare Other

## 2016-12-04 DIAGNOSIS — S92501D Displaced unspecified fracture of right lesser toe(s), subsequent encounter for fracture with routine healing: Secondary | ICD-10-CM

## 2016-12-04 DIAGNOSIS — S92811D Other fracture of right foot, subsequent encounter for fracture with routine healing: Secondary | ICD-10-CM

## 2016-12-04 NOTE — Progress Notes (Signed)
She presents today for follow-up of her fracture to the sesamoid. She states that really Stilley ball still bothers her and she is also complaining of pain to the lateral border of the hallux nail right.  Objective: Vital signs extension alert and oriented 3. Discussed pain on palpation to the tibial sesamoid. It appears to be more prominent now than it was previously I think more than likely is due to separation. Radiographs were taken today demonstrating actual sesamoid tibial border. Fibular border of the hallux nail appears to be slightly ingrown but no signs of months.  Assessment: Fracture tibial sesamoid. Ingrown nail border hallux right.  Plan: Trimmed the edge of the border back today to see if this would alleviate her symptoms. Encouraged her to continue the use of Darco shoe for the next 6-8 weeks should this not resolve then we'll consider an MRI for surgical consideration.

## 2016-12-22 ENCOUNTER — Other Ambulatory Visit: Payer: Self-pay | Admitting: Family Medicine

## 2016-12-22 DIAGNOSIS — F320 Major depressive disorder, single episode, mild: Secondary | ICD-10-CM

## 2016-12-22 MED ORDER — SERTRALINE HCL 100 MG PO TABS: 100.0000 mg | ORAL_TABLET | Freq: Every day | ORAL | 3 refills | Status: DC

## 2016-12-22 NOTE — Telephone Encounter (Signed)
Done-Anastasiya V Hopkins, RMA  

## 2016-12-22 NOTE — Telephone Encounter (Signed)
Pt contacted office for refill request on the following medications:  sertraline (ZOLOFT) 100 MG tablet.  90 day supply.  [facility].  CB#[phone]/MW

## 2017-01-17 ENCOUNTER — Other Ambulatory Visit: Payer: Self-pay | Admitting: Family Medicine

## 2017-01-17 DIAGNOSIS — E7849 Other hyperlipidemia: Secondary | ICD-10-CM

## 2017-01-22 ENCOUNTER — Ambulatory Visit (INDEPENDENT_AMBULATORY_CARE_PROVIDER_SITE_OTHER): Payer: Medicare Other | Admitting: Podiatry

## 2017-01-22 ENCOUNTER — Ambulatory Visit (INDEPENDENT_AMBULATORY_CARE_PROVIDER_SITE_OTHER): Payer: Medicare Other

## 2017-01-22 ENCOUNTER — Other Ambulatory Visit: Payer: Self-pay

## 2017-01-22 ENCOUNTER — Encounter: Payer: Self-pay | Admitting: Podiatry

## 2017-01-22 VITALS — BP 108/64 | HR 88 | Temp 97.7°F | Ht 60.0 in | Wt 149.2 lb

## 2017-01-22 DIAGNOSIS — L600 Ingrowing nail: Secondary | ICD-10-CM

## 2017-01-22 DIAGNOSIS — S92811D Other fracture of right foot, subsequent encounter for fracture with routine healing: Secondary | ICD-10-CM | POA: Diagnosis not present

## 2017-01-22 DIAGNOSIS — F320 Major depressive disorder, single episode, mild: Secondary | ICD-10-CM

## 2017-01-22 DIAGNOSIS — Z0000 Encounter for general adult medical examination without abnormal findings: Secondary | ICD-10-CM | POA: Diagnosis not present

## 2017-01-22 DIAGNOSIS — G2581 Restless legs syndrome: Secondary | ICD-10-CM

## 2017-01-22 DIAGNOSIS — E7849 Other hyperlipidemia: Secondary | ICD-10-CM

## 2017-01-22 MED ORDER — SERTRALINE HCL 100 MG PO TABS: 100.0000 mg | ORAL_TABLET | Freq: Every day | ORAL | 3 refills | Status: DC

## 2017-01-22 MED ORDER — CIPROFLOXACIN-DEXAMETHASONE 0.3-0.1 % OT SUSP: OTIC | 2 refills | Status: DC

## 2017-01-22 MED ORDER — ROSUVASTATIN CALCIUM 10 MG PO TABS: 10.0000 mg | ORAL_TABLET | Freq: Every day | ORAL | 3 refills | Status: DC

## 2017-01-22 MED ORDER — METFORMIN HCL ER 500 MG PO TB24: 500.0000 mg | ORAL_TABLET | Freq: Two times a day (BID) | ORAL | 3 refills | Status: DC

## 2017-01-22 NOTE — Patient Instructions (Signed)

## 2017-01-22 NOTE — Progress Notes (Signed)
Subjective:   Nicole Young is a 77 y.o. female who presents for Medicare Annual (Subsequent) preventive examination.  Review of Systems:  N/A  Cardiac Risk Factors include: advanced age (>61men, >19 women);diabetes mellitus;dyslipidemia;hypertension     Objective:     Vitals: BP 108/64 (BP Location: Left Arm)   Pulse 88   Temp 97.7 F (36.5 C) (Oral)   Ht 5' (1.524 m)   Wt 149 lb 3.2 oz (67.7 kg)   BMI 29.14 kg/m   Body mass index is 29.14 kg/m.  Advanced Directives 01/22/2017 12/16/2015 12/16/2015 12/07/2015 05/13/2015 11/17/2014 08/26/2014  Does Patient Have a Medical Advance Directive? Yes No No No Yes No Yes  Type of Advance Directive Healthcare Power of [place]  Does patient want to make changes to medical advance directive? - - - - No - Patient declined - No - Patient declined  Copy of [place] in Chart? No - copy requested - - - No - copy requested - No - copy requested  Would patient like information on creating a medical advance directive? - - No - Patient declined - - - -    Tobacco Social History   Tobacco Use  Smoking Status Never Smoker  Smokeless Tobacco Never Used     Counseling given: Not Answered   Clinical Intake:  Pre-visit preparation completed: Yes  Pain : No/denies pain Pain Score: 0-No pain     Nutritional Status: BMI 25 -29 Overweight Nutritional Risks: Nausea/ vomitting/ diarrhea(diarrhea occasionally due to Metformin) Diabetes: Yes CBG done?: No Did pt. bring in CBG monitor from home?: No  How often do you need to have someone help you when you read instructions, pamphlets, or other written materials from your doctor or pharmacy?: 1 - Never  Interpreter Needed?: No  Information entered by :: [facility], LPN  Past Medical History:  Diagnosis Date  . Complication of anesthesia    slow to wake up in past  . Depression    controlled  .  Diabetes mellitus without complication ([place])    controlled  . Hypothyroidism   . Neuropathy   . Osteoarthritis    knee, ankles, shoulders, back, neck, hands/fingers, wrists;   . Rectocele   . Thyroid disease    Past Surgical History:  Procedure Laterality Date  . AUGMENTATION MAMMAPLASTY    . BREAST SURGERY     impants replaced with lift  . CARPAL TUNNEL RELEASE Right 08/26/2014   Procedure: CARPAL TUNNEL RELEASE;  Surgeon: Earnestine Leys, MD;  Location: [facility] ORS;  Service: Orthopedics;  Laterality: Right;  . COLONOSCOPY  2005   Dr Bary Castilla  . COLONOSCOPY N/A 06/24/2014   Procedure: COLONOSCOPY;  Surgeon: Robert Bellow, MD;  Location: [facility] ENDOSCOPY;  Service: Endoscopy;  Laterality: N/A;  . PARATHYROIDECTOMY  1992  . PLACEMENT OF BREAST IMPLANTS  1975  . TONSILECTOMY, ADENOIDECTOMY, BILATERAL MYRINGOTOMY AND TUBES     Family History  Problem Relation Age of Onset  . Cancer Father        prostate  . Prostate cancer Father   . Hypertension Mother   . Heart attack Mother   . Heart attack Maternal Grandfather   . Diabetes Paternal Uncle   . Congestive Heart Failure Maternal Grandmother   . CVA Paternal Grandmother   . Prostate cancer Paternal Grandfather   . Breast cancer Maternal Aunt    Social History   Socioeconomic History  .  Marital status: Widowed    Spouse name: None  . Number of children: 3  . Years of education: None  . Highest education level: Bachelor's degree (e.g., BA, AB, BS)  Social Needs  . Financial resource strain: Not hard at all  . Food insecurity - worry: Never true  . Food insecurity - inability: Never true  . Transportation needs - medical: No  . Transportation needs - non-medical: No  Occupational History  . Occupation: retired  Tobacco Use  . Smoking status: Never Smoker  . Smokeless tobacco: Never Used  Substance and Sexual Activity  . Alcohol use: Yes    Alcohol/week: 1.2 - 1.8 oz    Types: 2 - 3 Glasses of wine per week  . Drug  use: No  . Sexual activity: None  Other Topics Concern  . None  Social History Narrative  . None    Outpatient Encounter Medications as of 01/22/2017  Medication Sig  . azelastine (ASTELIN) 0.1 % nasal spray Place 2 sprays into both nostrils. Reported on 06/21/2015  . diazepam (VALIUM) 5 MG tablet TAKE ONE TABLET BY MOUTH NIGHTLY AT BEDTIME AS NEEDED  . glucose blood (ACCU-CHEK SMARTVIEW) test strip ACCU-CHEK SMARTVIEW (In Vitro Strip)  1 (one) Strip Strip check sugar once daily for 0 days  Quantity: 50;  Refills: 12   Ordered :01-Dec-2013  Miguel Aschoff MD;  [place] [place] [phone] Active Comments: Diagnosis code E11.9  . levothyroxine (SYNTHROID, LEVOTHROID) 75 MCG tablet TAKE 1 TABLET EVERY DAY  . Magnesium 250 MG TABS Take 1 tablet by mouth daily.  . metFORMIN (GLUCOPHAGE-XR) 500 MG 24 hr tablet Take 1 tablet (500 mg total) by mouth 2 (two) times daily. (Patient taking differently: Take 1,000 mg by mouth daily with breakfast. )  . Multiple Vitamin (MULTIVITAMIN) capsule Take 1 capsule by mouth daily.  . pantoprazole (PROTONIX) 20 MG tablet Take 1 tablet (20 mg total) by mouth daily.  . rosuvastatin (CRESTOR) 10 MG tablet TAKE 1 TABLET AT BEDTIME  . sertraline (ZOLOFT) 100 MG tablet Take 1 tablet (100 mg total) by mouth daily.  . [DISCONTINUED] ascorbic acid (VITAMIN C) 100 MG tablet Take 500 mg by mouth 2 (two) times daily.   . [DISCONTINUED] Cholecalciferol 400 UNITS CAPS Take by mouth.   No facility-administered encounter medications on file as of 01/22/2017.     Activities of Daily Living In your present state of health, do you have any difficulty performing the following activities: 01/22/2017  Hearing? Y  Comment Pt is interested in a referral to see an audiologist.   Vision? N  Difficulty concentrating or making decisions? N  Walking or climbing stairs? N  Dressing or bathing? N  Doing errands, shopping? N  Preparing Food and eating ? N  Using the Toilet? N  In the  past six months, have you accidently leaked urine? Y  Comment occaisonally- only due to to not going when needs to  Do you have problems with loss of bowel control? N  Managing your Medications? N  Managing your Finances? N  Housekeeping or managing your Housekeeping? N  Some recent data might be hidden    Patient Care Team: Jerrol Banana., MD as PCP - General (Family Medicine) Birder Robson, MD as Referring Physician (Ophthalmology) Garrel Ridgel, DPM as Consulting Physician (Podiatry)    Assessment:   This is a routine wellness examination for [place].  Exercise Activities and Dietary recommendations Current Exercise Habits: The patient does not participate in regular exercise  at present, Exercise limited by: Other - see comments(due to toe hurting after fall 09/2016)  Goals    . DIET - INCREASE WATER INTAKE     Continue drinking 40 oz of water a day and increase when able to.        Fall Risk Fall Risk  01/22/2017 12/16/2015 05/17/2015  Falls in the past year? Yes No No  Number falls in past yr: 1 - -  Comment due to shoelaces getting stuck in moving walkway. - -  Injury with Fall? Yes - -  Comment broken toe - -   Is the patient's home free of loose throw rugs in walkways, pet beds, electrical cords, etc?   yes      Grab bars in the bathroom? no      Handrails on the stairs?   N/A      Adequate lighting?   yes  Timed Get Up and Go performed: N/A  Depression Screen PHQ 2/9 Scores 01/22/2017 10/17/2016 04/20/2016 12/16/2015  PHQ - 2 Score 1 0 0 0  PHQ- 9 Score - 0 3 -     Cognitive Function: Pt declined screening today.     6CIT Screen 12/16/2015  What Year? 0 points  What month? 0 points  What time? 0 points  Count back from 20 0 points  Months in reverse 0 points  Repeat phrase 0 points  Total Score 0    Immunization History  Administered Date(s) Administered  . Influenza-Unspecified 10/23/2014  . Pneumococcal Conjugate-13 12/16/2013  .  Pneumococcal Polysaccharide-23 12/23/2015  . Tdap 02/14/2013    Qualifies for Shingles Vaccine? Pt declines today.   Screening Tests Health Maintenance  Topic Date Due  . OPHTHALMOLOGY EXAM  03/31/2017  . HEMOGLOBIN A1C  04/17/2017  . FOOT EXAM  10/17/2017  . URINE MICROALBUMIN  10/17/2017  . TETANUS/TDAP  02/15/2023  . INFLUENZA VACCINE  Completed  . DEXA SCAN  Completed  . PNA vac Low Risk Adult  Completed    Cancer Screenings: Lung: Low Dose CT Chest recommended if Age 82-80 years, 30 pack-year currently smoking OR have quit w/in 15years. Patient does not qualify. Breast:  Up to date on Mammogram? No   Up to date of Bone Density/Dexa? Yes Colorectal: Up to date  Additional Screenings:  Hepatitis B/HIV/Syphillis: Pt declines today.  Hepatitis C Screening: Pt declines today.      Plan:  I have personally reviewed and addressed the Medicare Annual Wellness questionnaire and have noted the following in the patient's chart:  A. Medical and social history B. Use of alcohol, tobacco or illicit drugs  C. Current medications and supplements D. Functional ability and status E.  Nutritional status F.  Physical activity G. Advance directives H. List of other physicians I.  Hospitalizations, surgeries, and ER visits in previous 12 months J.  [place] such as hearing and vision if needed, cognitive and depression L. Referrals and appointments - none  In addition, I have reviewed and discussed with patient certain preventive protocols, quality metrics, and best practice recommendations. A written personalized care plan for preventive services as well as general preventive health recommendations were provided to patient.  See attached scanned questionnaire for additional information. Refill requests send to nurse.   Signed,  Fabio Neighbors, LPN Nurse Health Advisor   Nurse Recommendations: Pt is interested in receiving a referral to see an audiologist due to  hearing loss.

## 2017-01-22 NOTE — Patient Instructions (Signed)
Nicole Young , Thank you for taking time to come for your Medicare Wellness Visit. I appreciate your ongoing commitment to your health goals. Please review the following plan we discussed and let me know if I can assist you in the future.   Screening recommendations/referrals: Colonoscopy: Up to date Mammogram: Up to date Bone Density: Up to date Recommended yearly ophthalmology/optometry visit for glaucoma screening and checkup Recommended yearly dental visit for hygiene and checkup  Vaccinations: Influenza vaccine: Up to date Pneumococcal vaccine: Up to date Tdap vaccine: Up to date Shingles vaccine: Pt declines today.   Advanced directives: Please bring a copy of your POA (Power of Attorney) and/or Living Will to your next appointment.   Conditions/risks identified: Continue drinking 40 oz of water a day and increase when able to.   Next appointment: 04/17/17   Preventive Care 78 Years and Older, Female Preventive care refers to lifestyle choices and visits with your health care provider that can promote health and wellness. What does preventive care include?  A yearly physical exam. This is also called an annual well check.  Dental exams once or twice a year.  Routine eye exams. Ask your health care provider how often you should have your eyes checked.  Personal lifestyle choices, including:  Daily care of your teeth and gums.  Regular physical activity.  Eating a healthy diet.  Avoiding tobacco and drug use.  Limiting alcohol use.  Practicing safe sex.  Taking low-dose aspirin every day.  Taking vitamin and mineral supplements as recommended by your health care provider. What happens during an annual well check? The services and screenings done by your health care provider during your annual well check will depend on your age, overall health, lifestyle risk factors, and family history of disease. Counseling  Your health care provider may ask you questions  about your:  Alcohol use.  Tobacco use.  Drug use.  Emotional well-being.  Home and relationship well-being.  Sexual activity.  Eating habits.  History of falls.  Memory and ability to understand (cognition).  Work and work Statistician.  Reproductive health. Screening  You may have the following tests or measurements:  Height, weight, and BMI.  Blood pressure.  Lipid and cholesterol levels. These may be checked every 5 years, or more frequently if you are over 28 years old.  Skin check.  Lung cancer screening. You may have this screening every year starting at age 78 if you have a 30-pack-year history of smoking and currently smoke or have quit within the past 15 years.  Fecal occult blood test (FOBT) of the stool. You may have this test every year starting at age 78.  Flexible sigmoidoscopy or colonoscopy. You may have a sigmoidoscopy every 5 years or a colonoscopy every 10 years starting at age 78.  Hepatitis C blood test.  Hepatitis B blood test.  Sexually transmitted disease (STD) testing.  Diabetes screening. This is done by checking your blood sugar (glucose) after you have not eaten for a while (fasting). You may have this done every 1-3 years.  Bone density scan. This is done to screen for osteoporosis. You may have this done starting at age 78.  Mammogram. This may be done every 1-2 years. Talk to your health care provider about how often you should have regular mammograms. Talk with your health care provider about your test results, treatment options, and if necessary, the need for more tests. Vaccines  Your health care provider may recommend certain vaccines, such as:  Influenza vaccine. This is recommended every year.  Tetanus, diphtheria, and acellular pertussis (Tdap, Td) vaccine. You may need a Td booster every 10 years.  Zoster vaccine. You may need this after age 78.  Pneumococcal 13-valent conjugate (PCV13) vaccine. One dose is  recommended after age 32.  Pneumococcal polysaccharide (PPSV23) vaccine. One dose is recommended after age 78. Talk to your health care provider about which screenings and vaccines you need and how often you need them. This information is not intended to replace advice given to you by your health care provider. Make sure you discuss any questions you have with your health care provider. Document Released: 01/22/2015 Document Revised: 09/15/2015 Document Reviewed: 10/27/2014 Elsevier Interactive Patient Education  2017 [place] Prevention in the Home Falls can cause injuries. They can happen to people of all ages. There are many things you can do to make your home safe and to help prevent falls. What can I do on the outside of my home?  Regularly fix the edges of walkways and driveways and fix any cracks.  Remove anything that might make you trip as you walk through a door, such as a raised step or threshold.  Trim any bushes or trees on the path to your home.  Use bright outdoor lighting.  Clear any walking paths of anything that might make someone trip, such as rocks or tools.  Regularly check to see if handrails are loose or broken. Make sure that both sides of any steps have handrails.  Any raised decks and porches should have guardrails on the edges.  Have any leaves, snow, or ice cleared regularly.  Use sand or salt on walking paths during winter.  Clean up any spills in your garage right away. This includes oil or grease spills. What can I do in the bathroom?  Use night lights.  Install grab bars by the toilet and in the tub and shower. Do not use towel bars as grab bars.  Use non-skid mats or decals in the tub or shower.  If you need to sit down in the shower, use a plastic, non-slip stool.  Keep the floor dry. Clean up any water that spills on the floor as soon as it happens.  Remove soap buildup in the tub or shower regularly.  Attach bath mats  securely with double-sided non-slip rug tape.  Do not have throw rugs and other things on the floor that can make you trip. What can I do in the bedroom?  Use night lights.  Make sure that you have a light by your bed that is easy to reach.  Do not use any sheets or blankets that are too big for your bed. They should not hang down onto the floor.  Have a firm chair that has side arms. You can use this for support while you get dressed.  Do not have throw rugs and other things on the floor that can make you trip. What can I do in the kitchen?  Clean up any spills right away.  Avoid walking on wet floors.  Keep items that you use a lot in easy-to-reach places.  If you need to reach something above you, use a strong step stool that has a grab bar.  Keep electrical cords out of the way.  Do not use floor polish or wax that makes floors slippery. If you must use wax, use non-skid floor wax.  Do not have throw rugs and other things on the floor that  can make you trip. What can I do with my stairs?  Do not leave any items on the stairs.  Make sure that there are handrails on both sides of the stairs and use them. Fix handrails that are broken or loose. Make sure that handrails are as long as the stairways.  Check any carpeting to make sure that it is firmly attached to the stairs. Fix any carpet that is loose or worn.  Avoid having throw rugs at the top or bottom of the stairs. If you do have throw rugs, attach them to the floor with carpet tape.  Make sure that you have a light switch at the top of the stairs and the bottom of the stairs. If you do not have them, ask someone to add them for you. What else can I do to help prevent falls?  Wear shoes that:  Do not have high heels.  Have rubber bottoms.  Are comfortable and fit you well.  Are closed at the toe. Do not wear sandals.  If you use a stepladder:  Make sure that it is fully opened. Do not climb a closed  stepladder.  Make sure that both sides of the stepladder are locked into place.  Ask someone to hold it for you, if possible.  Clearly mark and make sure that you can see:  Any grab bars or handrails.  First and last steps.  Where the edge of each step is.  Use tools that help you move around (mobility aids) if they are needed. These include:  Canes.  Walkers.  Scooters.  Crutches.  Turn on the lights when you go into a dark area. Replace any light bulbs as soon as they burn out.  Set up your furniture so you have a clear path. Avoid moving your furniture around.  If any of your floors are uneven, fix them.  If there are any pets around you, be aware of where they are.  Review your medicines with your doctor. Some medicines can make you feel dizzy. This can increase your chance of falling. Ask your doctor what other things that you can do to help prevent falls. This information is not intended to replace advice given to you by your health care provider. Make sure you discuss any questions you have with your health care provider. Document Released: 10/22/2008 Document Revised: 06/03/2015 Document Reviewed: 01/30/2014 Elsevier Interactive Patient Education  2017 [employer].

## 2017-01-22 NOTE — Progress Notes (Signed)
She presents today for follow-up of fracture tibial sesamoid right.  He states that is just not doing good.  He also has pain on range of motion of the first metatarsophalangeal joint right foot.  States that the lateral aspect of the foot is starting to hurt and states that toenail bothers me every day.  She refers to the hallux.  Objective: Vital signs are stable alert and oriented x3.  Pulses are palpable.  She has pain on palpation first metatarsophalangeal joint of the left foot along with the fibular border of the hallux right.  It is mildly erythematous and painful on palpation.  Assessment: Chronic pain on range of motion and on palpation of the first metatarsophalangeal joint.  Radiograph confirms fracture of the sesamoid and osteoarthritis.  Ingrown toenail fibular border hallux right.  Plan: Requesting MRI since conservative therapies have failed first metatarsophalangeal joint of the right foot.  Also performed a chemical matrixectomy today after local anesthesia was administered.  She tolerated procedure well without complications.  Remove the fibular border of the hallux nail right.  She was given both oral and written home-going instructions for the care and soaking of her toe.  I will follow-up with her in 1 month.  She was also provided a prescription for Cortisporin Otic to be applied twice daily after soaking.  Awaiting MRI of the first metatarsophalangeal joint right foot.

## 2017-01-22 NOTE — Telephone Encounter (Signed)
Pt is requesting Valium and test [facility] and all others to be sent to [facility] for 90 day supply. Pt is also requesting a refill on accu check nano lancet but need to check with pharmacy for exact name from [facility] [place].

## 2017-01-23 ENCOUNTER — Other Ambulatory Visit: Payer: Self-pay

## 2017-01-23 MED ORDER — DIAZEPAM 5 MG PO TABS: ORAL_TABLET | ORAL | 1 refills | Status: DC

## 2017-01-23 MED ORDER — GLUCOSE BLOOD VI STRP: ORAL_STRIP | 12 refills | Status: DC

## 2017-01-23 MED ORDER — ACCU-CHEK FASTCLIX LANCETS MISC: 12 refills | Status: DC

## 2017-01-23 NOTE — Telephone Encounter (Signed)
Spoke with [facility] at [facility] and patient needs accu check fastclix lancets. RX sent in. Dr Rosanna Randy please review request for Diazepam. Thank Edrick Kins, RMA

## 2017-01-25 ENCOUNTER — Telehealth: Payer: Self-pay | Admitting: Podiatry

## 2017-01-25 NOTE — Telephone Encounter (Signed)
Pt is calling concerning MRI, she still has not heard anything. Dr. Milinda Pointer was supposed to refer her out.

## 2017-01-26 ENCOUNTER — Telehealth: Payer: Self-pay

## 2017-01-26 NOTE — Telephone Encounter (Signed)
Per Loni Dolly. With [employer], no precert required for MRI.  Patient notified and she will call to schedule her own appt.

## 2017-01-26 NOTE — Addendum Note (Signed)
Addended by: Graceann Congress D on: 01/26/2017 09:15 AM   Modules accepted: Orders

## 2017-01-26 NOTE — Telephone Encounter (Signed)
-----   Message from Rip Harbour, [facility] sent at 01/22/2017  5:21 PM EST ----- Regarding: MRI  MRI right foot - 1st MPJ right - fractured sesamoid with osteoarthritis joint-surgical consideration

## 2017-01-31 ENCOUNTER — Other Ambulatory Visit: Payer: Self-pay | Admitting: Family Medicine

## 2017-01-31 DIAGNOSIS — F320 Major depressive disorder, single episode, mild: Secondary | ICD-10-CM

## 2017-01-31 NOTE — Telephone Encounter (Signed)
[facility] a refill request for the following medications. Thanks CC  pantoprazole (PROTONIX) 20 MG tablet   levothyroxine (SYNTHROID, LEVOTHROID) 75 MCG tablet   sertraline (ZOLOFT) 100 MG tablet   metFORMIN (GLUCOPHAGE-XR) 500 MG 24 hr tablet

## 2017-02-02 ENCOUNTER — Ambulatory Visit
Admission: RE | Admit: 2017-02-02 | Discharge: 2017-02-02 | Disposition: A | Discharge status: Home / Self Care | Payer: Medicare Other | Source: Ambulatory Visit | Attending: Podiatry | Admitting: Podiatry

## 2017-02-02 ENCOUNTER — Encounter: Payer: Self-pay | Admitting: Sports Medicine

## 2017-02-02 DIAGNOSIS — S92811D Other fracture of right foot, subsequent encounter for fracture with routine healing: Secondary | ICD-10-CM | POA: Diagnosis present

## 2017-02-02 DIAGNOSIS — M25474 Effusion, right foot: Secondary | ICD-10-CM | POA: Diagnosis not present

## 2017-02-02 DIAGNOSIS — M19071 Primary osteoarthritis, right ankle and foot: Secondary | ICD-10-CM | POA: Insufficient documentation

## 2017-02-02 DIAGNOSIS — R600 Localized edema: Secondary | ICD-10-CM | POA: Diagnosis not present

## 2017-02-02 NOTE — Progress Notes (Signed)
Patient called answering service and reports that she had ingrown procedure 10 days ago with Dr. Milinda Pointer area was healing well but today noticed puss and redness. I advised patient to soak with epsom salt and to dress area with antibiotic cream. I also sent Augmentin to pharmacy and advised patient to call back if no better in 48 hours.  Dr Cannon Kettle

## 2017-02-05 ENCOUNTER — Encounter: Payer: Self-pay | Admitting: Podiatry

## 2017-02-05 ENCOUNTER — Ambulatory Visit (INDEPENDENT_AMBULATORY_CARE_PROVIDER_SITE_OTHER): Payer: Medicare Other | Admitting: Podiatry

## 2017-02-05 DIAGNOSIS — L600 Ingrowing nail: Secondary | ICD-10-CM

## 2017-02-05 DIAGNOSIS — M19079 Primary osteoarthritis, unspecified ankle and foot: Secondary | ICD-10-CM | POA: Diagnosis not present

## 2017-02-05 NOTE — Progress Notes (Signed)
She presents today for follow-up of her matrixectomy hallux right.  There is that over the weekend she had to call the emergency beeper because of an infection in the toenail.  She states that started taking the antibiotic and it feels much better.  She states it looks much better and I will started soaking it once again.  She states that she had discontinued soaking the toe.  She would also like to discuss her MRI findings.  Objective: Vital signs are stable alert and oriented x3.  Pulses are palpable.  Neurologic sensorium is intact.  Deep tendon reflexes are intact.  Muscle strength is normal and symmetrical.  Cutaneous evaluation demonstrates well-healing matrixectomy fibular border hallux right.  No erythema cellulitis drainage or odor at this point time.  She still has pain on palpation of the first metatarsophalangeal joint dorsal lateral as well as plantar.  MRI does demonstrate a bipartite tibial sesamoid with bony edema.  She also demonstrates significant osteoarthritis of the first metatarsophalangeal joint more than likely traumatic from her previous injury.  Assessment: Well-healing matrixectomy hallux right.  Osteoarthritis first metatarsophalangeal joint right foot.  Plan: Discussed etiology pathology conservative versus surgical therapies.  At this point I offered her an injection she declined she would like to wait until she is a little worse off and further out toward April.  She will be taking her granddaughter to [place] on April 2.  She would like to follow-up with me the third week of March for an injection to the joint.  I will follow-up with her at that time.

## 2017-02-16 ENCOUNTER — Other Ambulatory Visit: Payer: Self-pay | Admitting: Family Medicine

## 2017-02-16 DIAGNOSIS — F320 Major depressive disorder, single episode, mild: Secondary | ICD-10-CM

## 2017-02-16 NOTE — Telephone Encounter (Signed)
[facility] faxed a refill request for a 90-days supply for the following medications. Thanks CC  pantoprazole (PROTONIX) 20 MG tablet   levothyroxine (SYNTHROID, LEVOTHROID) 75 MCG tablet   metFORMIN (GLUCOPHAGE-XR) 500 MG 24 hr tablet   sertraline (ZOLOFT) 100 MG tablet

## 2017-02-19 MED ORDER — SERTRALINE HCL 100 MG PO TABS
100.0000 mg | ORAL_TABLET | Freq: Every day | ORAL | 3 refills | Status: DC
Start: 2017-02-19 — End: 2018-02-22

## 2017-02-19 MED ORDER — METFORMIN HCL ER 500 MG PO TB24: 500.0000 mg | ORAL_TABLET | Freq: Two times a day (BID) | ORAL | 3 refills | Status: DC

## 2017-02-19 MED ORDER — LEVOTHYROXINE SODIUM 75 MCG PO TABS: 75.0000 ug | ORAL_TABLET | Freq: Every day | ORAL | 3 refills | Status: DC

## 2017-02-19 MED ORDER — PANTOPRAZOLE SODIUM 20 MG PO TBEC: 20.0000 mg | DELAYED_RELEASE_TABLET | Freq: Every day | ORAL | 3 refills | Status: DC

## 2017-02-20 ENCOUNTER — Ambulatory Visit (INDEPENDENT_AMBULATORY_CARE_PROVIDER_SITE_OTHER): Payer: Medicare Other | Admitting: Family Medicine

## 2017-02-20 VITALS — BP 122/70 | HR 72 | Temp 97.5°F | Resp 16 | Wt 148.0 lb

## 2017-02-20 DIAGNOSIS — E119 Type 2 diabetes mellitus without complications: Secondary | ICD-10-CM | POA: Diagnosis not present

## 2017-02-20 DIAGNOSIS — K219 Gastro-esophageal reflux disease without esophagitis: Secondary | ICD-10-CM | POA: Diagnosis not present

## 2017-02-20 LAB — POCT GLYCOSYLATED HEMOGLOBIN (HGB A1C): HEMOGLOBIN A1C: 6.7

## 2017-02-20 MED ORDER — RANITIDINE HCL 150 MG PO CAPS
150.0000 mg | ORAL_CAPSULE | Freq: Two times a day (BID) | ORAL | 3 refills | Status: DC
Start: 2017-02-20 — End: 2017-07-17

## 2017-02-20 NOTE — Progress Notes (Signed)
Nicole Young  MRN: [id number] DOB: 06/28/1940  Subjective:  HPI   The patient is a 77 year old female who presents for follow up of her chronic health.  She was last seen on 01/22/17 with the nurse health advisor.    Diabetes-Patient had her last A1C done on 10/17/16 and it was 6.5.  She is up to date on her diabetic eye and foot exam and had normal urine micro-albumin in October as well.  Hypertension-Patient has a history of this but it is currently stable with no medication.  BP Readings from Last 3 Encounters:  02/20/17 122/70  01/22/17 108/64  10/17/16 110/62   GERD-Per the patient she had been instructed to transition from Protonix to Ranitidine.  She had not done this and would like to do this now.  She will need to get a new prescription of Ranitidine if you would like for her to switch.  Patient Active Problem List   Diagnosis Date Noted  . Scoliosis of lumbar spine 07/06/2016  . Degeneration of lumbar intervertebral disc 07/06/2016  . Arthralgia of multiple joints 11/12/2014  . Allergic rhinitis 11/12/2014  . Diabetes mellitus ([place]) 11/12/2014  . Essential (primary) hypertension 11/12/2014  . Personal history of other drug therapy 11/12/2014  . HLD (hyperlipidemia) 11/12/2014  . Adult hypothyroidism 11/12/2014  . Mild major depression ([place]) 11/12/2014  . Overweight 11/12/2014  . Restless leg 11/12/2014  . Encounter for screening colonoscopy 04/17/2014  . Type 2 diabetes mellitus ([place]) 12/23/2013    Past Medical History:  Diagnosis Date  . Complication of anesthesia    slow to wake up in past  . Depression    controlled  . Diabetes mellitus without complication ([place])    controlled  . Hypothyroidism   . Neuropathy   . Osteoarthritis    knee, ankles, shoulders, back, neck, hands/fingers, wrists;   . Rectocele   . Thyroid disease     Social History   Socioeconomic History  . Marital status: Widowed    Spouse name: Not on file  . Number of children:  3  . Years of education: Not on file  . Highest education level: Bachelor's degree (e.g., BA, AB, BS)  Social Needs  . Financial resource strain: Not hard at all  . Food insecurity - worry: Never true  . Food insecurity - inability: Never true  . Transportation needs - medical: No  . Transportation needs - non-medical: No  Occupational History  . Occupation: retired  Tobacco Use  . Smoking status: Never Smoker  . Smokeless tobacco: Never Used  Substance and Sexual Activity  . Alcohol use: Yes    Alcohol/week: 1.2 - 1.8 oz    Types: 2 - 3 Glasses of wine per week  . Drug use: No  . Sexual activity: Not on file  Other Topics Concern  . Not on file  Social History Narrative  . Not on file    Outpatient Encounter Medications as of 02/20/2017  Medication Sig Note  . ACCU-CHEK FASTCLIX LANCETS MISC Check sugar once daily. DX: E11.9   . azelastine (ASTELIN) 0.1 % nasal spray Place 2 sprays into both nostrils. Reported on 06/21/2015 06/17/2015: Received from: External Pharmacy  . diazepam (VALIUM) 5 MG tablet TAKE ONE TABLET BY MOUTH NIGHTLY AT BEDTIME AS NEEDED   . glucose blood (ACCU-CHEK SMARTVIEW) test strip Check sugar once daily. DX E11.9   . levothyroxine (SYNTHROID, LEVOTHROID) 75 MCG tablet Take 1 tablet (75 mcg total) by mouth daily.   Marland Kitchen  Magnesium 250 MG TABS Take 1 tablet by mouth daily.   . metFORMIN (GLUCOPHAGE-XR) 500 MG 24 hr tablet Take 1 tablet (500 mg total) by mouth 2 (two) times daily.   . Multiple Vitamin (MULTIVITAMIN) capsule Take 1 capsule by mouth daily.   . pantoprazole (PROTONIX) 20 MG tablet Take 1 tablet (20 mg total) by mouth daily.   . rosuvastatin (CRESTOR) 10 MG tablet Take 1 tablet (10 mg total) by mouth at bedtime.   . sertraline (ZOLOFT) 100 MG tablet Take 1 tablet (100 mg total) by mouth daily.   . [DISCONTINUED] ciprofloxacin-dexamethasone (CIPRODEX) OTIC suspension Apply 1-2 drops to toe after soaking BIC    No facility-administered encounter  medications on file as of 02/20/2017.     Allergies  Allergen Reactions  . Neomycin-Polymyxin-Gramicidin Other (See Comments)    Other Reaction: Other reaction  . Wilder Glade [Dapagliflozin] Other (See Comments)    Weak and dizzy   . Neosporin [Neomycin-Bacitracin Zn-Polymyx] Swelling  . Thimerosal Swelling  . Zostavax  [Zoster Vaccine Live] Rash    Do Not Administer Zostavax to Pt!    Review of Systems  Constitutional: Negative.   HENT: Positive for congestion.        Runny nose  Eyes: Negative.   Respiratory: Negative.   Cardiovascular: Negative.   Gastrointestinal: Negative.   Genitourinary: Negative.   Musculoskeletal: Positive for back pain, joint pain and neck pain.  Skin: Negative.   Neurological: Negative.   Endo/Heme/Allergies: Negative.        Heat intolerance  Psychiatric/Behavioral: Negative.     Objective:  BP 122/70 (BP Location: Right Arm, Patient Position: Sitting, Cuff Size: Normal)   Pulse 72   Temp (!) 97.5 F (36.4 C) (Oral)   Resp 16   Wt 148 lb (67.1 kg)   BMI 28.90 kg/m   Physical Exam  Constitutional: She is well-developed, well-nourished, and in no distress.  HENT:  Head: Normocephalic.  Eyes: Pupils are equal, round, and reactive to light.  Neck: Normal range of motion.  Cardiovascular: Normal rate, regular rhythm and normal heart sounds.  Pulmonary/Chest: Effort normal and breath sounds normal.    Assessment and Plan :  1. Gastroesophageal reflux disease, esophagitis presence not specified  - ranitidine (ZANTAC) 150 MG capsule; Take 1 capsule (150 mg total) by mouth 2 (two) times daily.  Dispense: 180 capsule; Refill: 3  2. Type 2 diabetes mellitus without complication, without long-term current use of insulin (HCC)  - POCT glycosylated hemoglobin (Hb A1C)6.7 today. 3.HLD  I have done the exam and reviewed the chart and it is accurate to the best of my knowledge. Development worker, community has been used and  any errors in dictation or  transcription are unintentional. Miguel Aschoff M.D. [facility]

## 2017-03-05 DIAGNOSIS — L821 Other seborrheic keratosis: Secondary | ICD-10-CM | POA: Diagnosis not present

## 2017-03-05 DIAGNOSIS — L820 Inflamed seborrheic keratosis: Secondary | ICD-10-CM | POA: Diagnosis not present

## 2017-03-05 DIAGNOSIS — L853 Xerosis cutis: Secondary | ICD-10-CM | POA: Diagnosis not present

## 2017-03-05 DIAGNOSIS — L578 Other skin changes due to chronic exposure to nonionizing radiation: Secondary | ICD-10-CM | POA: Diagnosis not present

## 2017-03-05 DIAGNOSIS — D229 Melanocytic nevi, unspecified: Secondary | ICD-10-CM | POA: Diagnosis not present

## 2017-03-05 DIAGNOSIS — D1800 Hemangioma unspecified site: Secondary | ICD-10-CM | POA: Diagnosis not present

## 2017-03-05 DIAGNOSIS — Z1283 Encounter for screening for malignant neoplasm of skin: Secondary | ICD-10-CM | POA: Diagnosis not present

## 2017-03-05 DIAGNOSIS — L814 Other melanin hyperpigmentation: Secondary | ICD-10-CM | POA: Diagnosis not present

## 2017-03-06 DIAGNOSIS — E119 Type 2 diabetes mellitus without complications: Secondary | ICD-10-CM | POA: Diagnosis not present

## 2017-03-06 LAB — HM DIABETES EYE EXAM

## 2017-03-26 ENCOUNTER — Encounter: Payer: Self-pay | Admitting: Podiatry

## 2017-03-26 ENCOUNTER — Ambulatory Visit (INDEPENDENT_AMBULATORY_CARE_PROVIDER_SITE_OTHER): Payer: Medicare Other | Admitting: Podiatry

## 2017-03-26 DIAGNOSIS — M778 Other enthesopathies, not elsewhere classified: Secondary | ICD-10-CM

## 2017-03-26 DIAGNOSIS — M19079 Primary osteoarthritis, unspecified ankle and foot: Secondary | ICD-10-CM | POA: Diagnosis not present

## 2017-03-26 DIAGNOSIS — M7751 Other enthesopathy of right foot: Secondary | ICD-10-CM | POA: Diagnosis not present

## 2017-03-26 DIAGNOSIS — M779 Enthesopathy, unspecified: Principal | ICD-10-CM

## 2017-03-26 NOTE — Progress Notes (Signed)
She presents today before leaving for [place] with a chief complaint of pain to the first metatarsophalangeal joint of the right foot.  Objective: Vital signs are stable alert and oriented x3.  Pulses are palpable.  Neurologic sensorium is intact.  Deep tendon reflexes are intact.  Muscle strength is normal symmetrical.  She still has osteoarthritis first metatarsophalangeal joint of the right foot painful on palpation and range of motion.  Assessment: Osteoarthritis healing matrixectomy hallux right.  Plan: After sterile Betadine skin prep I injected around the metatarsal phalangeal joint today with 20 mg Kenalog 5 mg Marcaine after sterile Betadine skin prep.  Tolerated procedure well without complications follow-up with her when she returns from [place].

## 2017-04-17 ENCOUNTER — Ambulatory Visit: Payer: Self-pay | Admitting: Family Medicine

## 2017-04-17 ENCOUNTER — Telehealth: Payer: Self-pay | Admitting: Emergency Medicine

## 2017-04-17 NOTE — Telephone Encounter (Signed)
Per pharmacy pt is requesting a lower out of pocket cost for Rosuvastatin 10 mg. Alternative include atorvastatin 20 mg. Please advise. Thanks.

## 2017-04-19 ENCOUNTER — Ambulatory Visit: Payer: Medicare Other | Admitting: Family Medicine

## 2017-04-19 NOTE — Progress Notes (Deleted)
       Patient: Nicole Young Female    DOB: [birth year]   77 y.o.   MRN: [id number] Visit Date: 04/19/2017  Today's Provider: Vernie Murders, PA   No chief complaint on file.  Subjective:    HPI     Allergies  Allergen Reactions  . Neomycin-Polymyxin-Gramicidin Other (See Comments)    Other Reaction: Other reaction  . Wilder Glade [Dapagliflozin] Other (See Comments)    Weak and dizzy   . Neosporin [Neomycin-Bacitracin Zn-Polymyx] Swelling  . Thimerosal Swelling  . Zostavax  [Zoster Vaccine Live] Rash    Do Not Administer Zostavax to Pt!     Current Outpatient Medications:  .  ACCU-CHEK FASTCLIX LANCETS MISC, Check sugar once daily. DX: E11.9, Disp: 102 each, Rfl: 12 .  azelastine (ASTELIN) 0.1 % nasal spray, Place 2 sprays into both nostrils. Reported on 06/21/2015, Disp: , Rfl:  .  diazepam (VALIUM) 5 MG tablet, TAKE ONE TABLET BY MOUTH NIGHTLY AT BEDTIME AS NEEDED, Disp: 30 tablet, Rfl: 1 .  glucose blood (ACCU-CHEK SMARTVIEW) test strip, Check sugar once daily. DX E11.9, Disp: 100 each, Rfl: 12 .  levothyroxine (SYNTHROID, LEVOTHROID) 75 MCG tablet, Take 1 tablet (75 mcg total) by mouth daily., Disp: 90 tablet, Rfl: 3 .  Magnesium 250 MG TABS, Take 1 tablet by mouth daily., Disp: , Rfl:  .  metFORMIN (GLUCOPHAGE-XR) 500 MG 24 hr tablet, Take 1 tablet (500 mg total) by mouth 2 (two) times daily., Disp: 180 tablet, Rfl: 3 .  Multiple Vitamin (MULTIVITAMIN) capsule, Take 1 capsule by mouth daily., Disp: , Rfl:  .  pantoprazole (PROTONIX) 20 MG tablet, Take 1 tablet (20 mg total) by mouth daily., Disp: 90 tablet, Rfl: 3 .  ranitidine (ZANTAC) 150 MG capsule, Take 1 capsule (150 mg total) by mouth 2 (two) times daily., Disp: 180 capsule, Rfl: 3 .  rosuvastatin (CRESTOR) 10 MG tablet, Take 1 tablet (10 mg total) by mouth at bedtime., Disp: 90 tablet, Rfl: 3 .  sertraline (ZOLOFT) 100 MG tablet, Take 1 tablet (100 mg total) by mouth daily., Disp: 90 tablet, Rfl: 3  Review of  Systems  Social History   Tobacco Use  . Smoking status: Never Smoker  . Smokeless tobacco: Never Used  Substance Use Topics  . Alcohol use: Yes    Alcohol/week: 1.2 - 1.8 oz    Types: 2 - 3 Glasses of wine per week   Objective:   There were no vitals taken for this visit.   Physical Exam      Assessment & Plan:           Vernie Murders, PA  [facility]

## 2017-04-20 ENCOUNTER — Encounter: Payer: Self-pay | Admitting: Family Medicine

## 2017-04-20 ENCOUNTER — Ambulatory Visit (INDEPENDENT_AMBULATORY_CARE_PROVIDER_SITE_OTHER): Payer: Medicare Other | Admitting: Family Medicine

## 2017-04-20 VITALS — BP 128/60 | HR 85 | Temp 99.0°F | Wt 147.0 lb

## 2017-04-20 DIAGNOSIS — J40 Bronchitis, not specified as acute or chronic: Secondary | ICD-10-CM

## 2017-04-20 MED ORDER — AZITHROMYCIN 250 MG PO TABS: ORAL_TABLET | ORAL | 0 refills | Status: DC

## 2017-04-20 NOTE — Progress Notes (Signed)
Patient: Nicole Young Female    DOB: [birth year]   77 y.o.   MRN: [id number] Visit Date: 04/20/2017  Today's Provider: Vernie Murders, PA   Chief Complaint  Patient presents with  . URI   Subjective:    URI   This is a new problem. Episode onset: 4-5 days ago. The problem has been gradually worsening. There has been no fever. Associated symptoms include congestion, coughing, headaches and a sore throat. Associated symptoms comments: Hoarseness, chest tightness,and fatigue. Treatments tried: Sudafed, Astelin, RX cough medication with codeine. The treatment provided moderate relief.   Past Medical History:  Diagnosis Date  . Complication of anesthesia    slow to wake up in past  . Depression    controlled  . Diabetes mellitus without complication ([place])    controlled  . Hypothyroidism   . Neuropathy   . Osteoarthritis    knee, ankles, shoulders, back, neck, hands/fingers, wrists;   . Rectocele   . Thyroid disease    Past Surgical History:  Procedure Laterality Date  . AUGMENTATION MAMMAPLASTY    . BREAST SURGERY     impants replaced with lift  . CARPAL TUNNEL RELEASE Right 08/26/2014   Procedure: CARPAL TUNNEL RELEASE;  Surgeon: Earnestine Leys, MD;  Location: [facility] ORS;  Service: Orthopedics;  Laterality: Right;  . COLONOSCOPY  2005   Dr Bary Castilla  . COLONOSCOPY N/A 06/24/2014   Procedure: COLONOSCOPY;  Surgeon: Robert Bellow, MD;  Location: [facility];  Service: Endoscopy;  Laterality: N/A;  . PARATHYROIDECTOMY  1992  . PLACEMENT OF BREAST IMPLANTS  1975  . TONSILECTOMY, ADENOIDECTOMY, BILATERAL MYRINGOTOMY AND TUBES     Family History  Problem Relation Age of Onset  . Cancer Father        prostate  . Prostate cancer Father   . Hypertension Mother   . Heart attack Mother   . Heart attack Maternal Grandfather   . Diabetes Paternal Uncle   . Congestive Heart Failure Maternal Grandmother   . CVA Paternal Grandmother   . Prostate cancer Paternal  Grandfather   . Breast cancer Maternal Aunt    Allergies  Allergen Reactions  . Neomycin-Polymyxin-Gramicidin Other (See Comments)    Other Reaction: Other reaction  . Wilder Glade [Dapagliflozin] Other (See Comments)    Weak and dizzy   . Neosporin [Neomycin-Bacitracin Zn-Polymyx] Swelling  . Thimerosal Swelling  . Zostavax  [Zoster Vaccine Live] Rash    Do Not Administer Zostavax to Pt!    Current Outpatient Medications:  .  ACCU-CHEK FASTCLIX LANCETS MISC, Check sugar once daily. DX: E11.9, Disp: 102 each, Rfl: 12 .  azelastine (ASTELIN) 0.1 % nasal spray, Place 2 sprays into both nostrils. Reported on 06/21/2015, Disp: , Rfl:  .  diazepam (VALIUM) 5 MG tablet, TAKE ONE TABLET BY MOUTH NIGHTLY AT BEDTIME AS NEEDED, Disp: 30 tablet, Rfl: 1 .  glucose blood (ACCU-CHEK SMARTVIEW) test strip, Check sugar once daily. DX E11.9, Disp: 100 each, Rfl: 12 .  levothyroxine (SYNTHROID, LEVOTHROID) 75 MCG tablet, Take 1 tablet (75 mcg total) by mouth daily., Disp: 90 tablet, Rfl: 3 .  Magnesium 250 MG TABS, Take 1 tablet by mouth daily., Disp: , Rfl:  .  metFORMIN (GLUCOPHAGE-XR) 500 MG 24 hr tablet, Take 1 tablet (500 mg total) by mouth 2 (two) times daily., Disp: 180 tablet, Rfl: 3 .  Multiple Vitamin (MULTIVITAMIN) capsule, Take 1 capsule by mouth daily., Disp: , Rfl:  .  pantoprazole (PROTONIX) 20 MG  tablet, Take 1 tablet (20 mg total) by mouth daily., Disp: 90 tablet, Rfl: 3 .  ranitidine (ZANTAC) 150 MG capsule, Take 1 capsule (150 mg total) by mouth 2 (two) times daily., Disp: 180 capsule, Rfl: 3 .  rosuvastatin (CRESTOR) 10 MG tablet, Take 1 tablet (10 mg total) by mouth at bedtime., Disp: 90 tablet, Rfl: 3 .  sertraline (ZOLOFT) 100 MG tablet, Take 1 tablet (100 mg total) by mouth daily., Disp: 90 tablet, Rfl: 3  Review of Systems  Constitutional: Negative.   HENT: Positive for congestion and sore throat.   Respiratory: Positive for cough.   Cardiovascular: Negative.   Neurological:  Positive for headaches.   Social History   Tobacco Use  . Smoking status: Never Smoker  . Smokeless tobacco: Never Used  Substance Use Topics  . Alcohol use: Yes    Alcohol/week: 1.2 - 1.8 oz    Types: 2 - 3 Glasses of wine per week   Objective:   BP 128/60 (BP Location: Right Arm, Patient Position: Sitting, Cuff Size: Normal)   Pulse 85   Temp 99 F (37.2 C) (Oral)   Wt 147 lb (66.7 kg)   SpO2 97%   BMI 28.71 kg/m   Physical Exam  Constitutional: She is oriented to person, place, and time. She appears well-developed and well-nourished. No distress.  HENT:  Head: Normocephalic and atraumatic.  Right Ear: Hearing and external ear normal.  Left Ear: Hearing and external ear normal.  Nose: Nose normal.  Mouth/Throat: Oropharynx is clear and moist. No oropharyngeal exudate.  Eyes: Conjunctivae and lids are normal. Right eye exhibits no discharge. Left eye exhibits no discharge. No scleral icterus.  Cardiovascular: Normal rate.  Pulmonary/Chest: Effort normal. No respiratory distress.  Coarse breath sounds without wheeze or rales.  Abdominal: Soft.  Musculoskeletal: Normal range of motion.  Neurological: She is alert and oriented to person, place, and time.  Skin: Skin is intact. No lesion and no rash noted.  Psychiatric: She has a normal mood and affect. Her speech is normal and behavior is normal. Thought content normal.      Assessment & Plan:     1. Bronchitis Onset with sore throat, ear ache, PND and cough 4-5 days ago. No significant fever. Robitussin-AC helpful with controlling cough. May add Advil or Aleve for aches or pains. Add Z-pak and increase fluid intake. Recheck if no better in a week. - azithromycin (ZITHROMAX) 250 MG tablet; Take 2 tablets by mouth today then one daily for 4 days.  Dispense: 6 tablet; Refill: [place], PA  [facility]

## 2017-05-10 ENCOUNTER — Ambulatory Visit (INDEPENDENT_AMBULATORY_CARE_PROVIDER_SITE_OTHER): Payer: Medicare Other | Admitting: Family Medicine

## 2017-05-10 ENCOUNTER — Encounter: Payer: Self-pay | Admitting: Family Medicine

## 2017-05-10 VITALS — BP 118/88 | HR 84 | Temp 97.6°F | Resp 18 | Wt 144.0 lb

## 2017-05-10 DIAGNOSIS — R05 Cough: Secondary | ICD-10-CM

## 2017-05-10 DIAGNOSIS — R5383 Other fatigue: Secondary | ICD-10-CM | POA: Diagnosis not present

## 2017-05-10 DIAGNOSIS — J069 Acute upper respiratory infection, unspecified: Secondary | ICD-10-CM

## 2017-05-10 DIAGNOSIS — R059 Cough, unspecified: Secondary | ICD-10-CM

## 2017-05-10 MED ORDER — PREDNISONE 20 MG PO TABS: 20.0000 mg | ORAL_TABLET | Freq: Two times a day (BID) | ORAL | 0 refills | Status: DC

## 2017-05-10 NOTE — Progress Notes (Signed)
Patient: Nicole Young Female    DOB: Feb 11, 1940   77 y.o.   MRN: [id number] Visit Date: 05/10/2017  Today's Provider: [name], MD   Chief Complaint  Patient presents with  . Fatigue  . Ear Fullness   Subjective:    HPI Pt is here today for fatigue. She reports that she was here 2 weeks ago for bronchitis. She was given a Z-pak. She feels better from that but she is still tired. Usually she is good in the mornings but about lunch time she started getting tired, and light headed. She still has a little cough, ear pain and fullness.      Allergies  Allergen Reactions  . Neomycin-Polymyxin-Gramicidin Other (See Comments)    Other Reaction: Other reaction  . Wilder Glade [Dapagliflozin] Other (See Comments)    Weak and dizzy   . Neosporin [Neomycin-Bacitracin Zn-Polymyx] Swelling  . Thimerosal Swelling  . Zostavax  [Zoster Vaccine Live] Rash    Do Not Administer Zostavax to Pt!     Current Outpatient Medications:  .  ACCU-CHEK FASTCLIX LANCETS MISC, Check sugar once daily. DX: E11.9, Disp: 102 each, Rfl: 12 .  diazepam (VALIUM) 5 MG tablet, TAKE ONE TABLET BY MOUTH NIGHTLY AT BEDTIME AS NEEDED, Disp: 30 tablet, Rfl: 1 .  glucose blood (ACCU-CHEK SMARTVIEW) test strip, Check sugar once daily. DX E11.9, Disp: 100 each, Rfl: 12 .  levothyroxine (SYNTHROID, LEVOTHROID) 75 MCG tablet, Take 1 tablet (75 mcg total) by mouth daily., Disp: 90 tablet, Rfl: 3 .  Magnesium 250 MG TABS, Take 1 tablet by mouth daily., Disp: , Rfl:  .  metFORMIN (GLUCOPHAGE-XR) 500 MG 24 hr tablet, Take 1 tablet (500 mg total) by mouth 2 (two) times daily., Disp: 180 tablet, Rfl: 3 .  Multiple Vitamin (MULTIVITAMIN) capsule, Take 1 capsule by mouth daily., Disp: , Rfl:  .  pantoprazole (PROTONIX) 20 MG tablet, Take 1 tablet (20 mg total) by mouth daily., Disp: 90 tablet, Rfl: 3 .  rosuvastatin (CRESTOR) 10 MG tablet, Take 1 tablet (10 mg total) by mouth at bedtime., Disp: 90 tablet, Rfl: 3 .   sertraline (ZOLOFT) 100 MG tablet, Take 1 tablet (100 mg total) by mouth daily., Disp: 90 tablet, Rfl: 3 .  azelastine (ASTELIN) 0.1 % nasal spray, Place 2 sprays into both nostrils. Reported on 06/21/2015, Disp: , Rfl:  .  azithromycin (ZITHROMAX) 250 MG tablet, Take 2 tablets by mouth today then one daily for 4 days. (Patient not taking: Reported on 05/10/2017), Disp: 6 tablet, Rfl: 0 .  ranitidine (ZANTAC) 150 MG capsule, Take 1 capsule (150 mg total) by mouth 2 (two) times daily. (Patient not taking: Reported on 05/10/2017), Disp: 180 capsule, Rfl: 3  Review of Systems  Constitutional: Positive for fatigue.  HENT:       Ear fullness  Eyes: Negative.   Respiratory: Positive for cough.   Cardiovascular: Negative.   Gastrointestinal: Negative.   Endocrine: Negative.   Genitourinary: Negative.   Musculoskeletal: Negative.   Skin: Negative.   Allergic/Immunologic: Negative.   Neurological: Negative.   Hematological: Negative.   Psychiatric/Behavioral: Negative.     Social History   Tobacco Use  . Smoking status: Never Smoker  . Smokeless tobacco: Never Used  Substance Use Topics  . Alcohol use: Yes    Alcohol/week: 1.2 - 1.8 oz    Types: 2 - 3 Glasses of wine per week   Objective:   BP 118/88 (BP Location: Left Arm,  Patient Position: Sitting, Cuff Size: Normal)   Pulse 84   Temp 97.6 F (36.4 C) (Oral)   Resp 18   Wt 144 lb (65.3 kg)   SpO2 95%   BMI 28.12 kg/m  Vitals:   05/10/17 1630  BP: 118/88  Pulse: 84  Resp: 18  Temp: 97.6 F (36.4 C)  TempSrc: Oral  SpO2: 95%  Weight: 144 lb (65.3 kg)     Physical Exam  Constitutional: She is oriented to person, place, and time. She appears well-developed and well-nourished.  HENT:  Head: Normocephalic and atraumatic.  Right Ear: External ear normal.  Left Ear: External ear normal.  Nose: Nose normal.  Mouth/Throat: Oropharynx is clear and moist.  Eyes: Conjunctivae are normal. No scleral icterus.  Neck: No  thyromegaly present.  Cardiovascular: Normal rate, regular rhythm and normal heart sounds.  Pulmonary/Chest: Effort normal and breath sounds normal.  Abdominal: Soft.  Musculoskeletal: She exhibits no edema.  Lymphadenopathy:    She has no cervical adenopathy.  Neurological: She is alert and oriented to person, place, and time.  Skin: Skin is warm and dry.  Psychiatric: She has a normal mood and affect. Her behavior is normal. Judgment and thought content normal.        Assessment & Plan:     1. Cough Post viral cough. Improving regularly - predniSONE (DELTASONE) 20 MG tablet; Take 1 tablet (20 mg total) by mouth 2 (two) times daily.  Dispense: 6 tablet; Refill: 0  2. Other fatigue Improving.  3. Upper respiratory tract infection, unspecified type  - predniSONE (DELTASONE) 20 MG tablet; Take 1 tablet (20 mg total) by mouth 2 (two) times daily.  Dispense: 6 tablet; Refill: 0      I have done the exam and reviewed the above chart and it is accurate to the best of my knowledge. Development worker, community has been used in this note in any air is in the dictation or transcription are unintentional.  [name], MD  [place]

## 2017-05-18 ENCOUNTER — Encounter: Payer: Self-pay | Admitting: Family Medicine

## 2017-05-18 ENCOUNTER — Other Ambulatory Visit: Payer: Self-pay | Admitting: Family Medicine

## 2017-05-18 ENCOUNTER — Ambulatory Visit (INDEPENDENT_AMBULATORY_CARE_PROVIDER_SITE_OTHER): Payer: Medicare Other | Admitting: Family Medicine

## 2017-05-18 VITALS — BP 130/70 | HR 86 | Temp 97.9°F | Resp 16 | Wt 146.2 lb

## 2017-05-18 DIAGNOSIS — R05 Cough: Secondary | ICD-10-CM

## 2017-05-18 DIAGNOSIS — R059 Cough, unspecified: Secondary | ICD-10-CM

## 2017-05-18 NOTE — Progress Notes (Signed)
  Subjective:     Patient ID: Nicole Young, female   DOB: 11/26/1940, 77 y.o.   MRN: [id number] Chief Complaint  Patient presents with  . URI    Patient returns to office today for follow up after being seen and treated for URI 05/10/17. Patient states that she has completed prednisone and symptom had improved, patient states in the past 24-48hrs her cough has returned and now has pain in chest from cough.    HPI Reports she felt "normal" yesterday but redeveloped a dry cough last night. Describes tickle in her throat which provokes the cough.States her ears also still crackle on occasion. Denies significant allergies but has been on Astelin spray in the past. Reports compliance with Protonix.  Review of Systems     Objective:   Physical Exam  Constitutional: She appears well-developed and well-nourished. No distress.  Ears: T.M's intact without inflammation Throat: no tonsillar enlargement or exudate Neck: no cervical adenopathy Lungs: clear     Assessment:    1. Cough: ? Triggered by PND or reflux.     Plan:    Add Zantac at bedtime and add a non-sedating antihistamine like Claritin. Phone f/u next week.

## 2017-05-18 NOTE — Patient Instructions (Signed)
Add Zantac 150 mg at bedtime and Claritin or Allegra during the day. Phone follow up next week.

## 2017-07-02 ENCOUNTER — Other Ambulatory Visit: Payer: Self-pay | Admitting: *Deleted

## 2017-07-02 ENCOUNTER — Other Ambulatory Visit: Payer: Self-pay | Admitting: Family Medicine

## 2017-07-02 DIAGNOSIS — Z1231 Encounter for screening mammogram for malignant neoplasm of breast: Secondary | ICD-10-CM

## 2017-07-03 MED ORDER — METFORMIN HCL ER 500 MG PO TB24: 500.0000 mg | ORAL_TABLET | Freq: Two times a day (BID) | ORAL | 3 refills | Status: DC

## 2017-07-17 ENCOUNTER — Ambulatory Visit (INDEPENDENT_AMBULATORY_CARE_PROVIDER_SITE_OTHER): Payer: Medicare Other | Admitting: Family Medicine

## 2017-07-17 VITALS — BP 118/64 | HR 72 | Resp 16 | Wt 147.0 lb

## 2017-07-17 DIAGNOSIS — F320 Major depressive disorder, single episode, mild: Secondary | ICD-10-CM

## 2017-07-17 DIAGNOSIS — I10 Essential (primary) hypertension: Secondary | ICD-10-CM | POA: Diagnosis not present

## 2017-07-17 DIAGNOSIS — K219 Gastro-esophageal reflux disease without esophagitis: Secondary | ICD-10-CM | POA: Diagnosis not present

## 2017-07-17 DIAGNOSIS — E7849 Other hyperlipidemia: Secondary | ICD-10-CM | POA: Diagnosis not present

## 2017-07-17 DIAGNOSIS — E039 Hypothyroidism, unspecified: Secondary | ICD-10-CM

## 2017-07-17 DIAGNOSIS — E119 Type 2 diabetes mellitus without complications: Secondary | ICD-10-CM | POA: Diagnosis not present

## 2017-07-17 LAB — POCT GLYCOSYLATED HEMOGLOBIN (HGB A1C): Hemoglobin A1C: 6.7 % — AB (ref 4.0–5.6)

## 2017-07-17 MED ORDER — GLUCOSE BLOOD VI STRP: ORAL_STRIP | 12 refills | Status: DC

## 2017-07-17 NOTE — Progress Notes (Signed)
Nicole Young  MRN: [id number] DOB: April 18, 1940  Subjective:  HPI   Patient is a 77 year old female who presents for 6 month follow up of chronic health.  She was last seen for this on 10/17/16 however, she has had multiple acute visits since that time.   Diabetes-Her last A1C was on 02/20/17 and it was 6.7.  The patient checks her glucose at home and states she has difficulty with her fasting readings being a little high.  She gets readings that range 115-145.  She states when she checks it at other times it is good.  Hypertension-stable on last visit BP Readings from Last 3 Encounters:  05/18/17 130/70  05/10/17 118/88  04/20/17 128/60   Depression-Stable on last visit with PHQ9 on that day of 0.  Patient states she still feels well and thinks her depression is is in remission.  GERD-On her last visit she was being controlled with Pantoprazole 40 mg and she was instructed to decrease to 20 mg and see how she did.  The patient states that she did not do real well on the lower dose so she went back to the 40 mg but started taking it at a different time of day.  She states this helped more than any other time and she is now going to try decreasing again and see if at this new time of day she can do well with the lower dose.  Patient Active Problem List   Diagnosis Date Noted  . Scoliosis of lumbar spine 07/06/2016  . Degeneration of lumbar intervertebral disc 07/06/2016  . Arthralgia of multiple joints 11/12/2014  . Allergic rhinitis 11/12/2014  . Diabetes mellitus ([place]) 11/12/2014  . Essential (primary) hypertension 11/12/2014  . Personal history of other drug therapy 11/12/2014  . HLD (hyperlipidemia) 11/12/2014  . Adult hypothyroidism 11/12/2014  . Mild major depression ([place]) 11/12/2014  . Overweight 11/12/2014  . Restless leg 11/12/2014  . Encounter for screening colonoscopy 04/17/2014  . Type 2 diabetes mellitus ([place]) 12/23/2013    Past Medical History:  Diagnosis Date    . Complication of anesthesia    slow to wake up in past  . Depression    controlled  . Diabetes mellitus without complication ([place])    controlled  . Hypothyroidism   . Neuropathy   . Osteoarthritis    knee, ankles, shoulders, back, neck, hands/fingers, wrists;   . Rectocele   . Thyroid disease     Social History   Socioeconomic History  . Marital status: Widowed    Spouse name: Not on file  . Number of children: 3  . Years of education: Not on file  . Highest education level: Bachelor's degree (e.g., BA, AB, BS)  Occupational History  . Occupation: retired  Scientific laboratory technician  . Financial resource strain: Not hard at all  . Food insecurity:    Worry: Never true    Inability: Never true  . Transportation needs:    Medical: No    Non-medical: No  Tobacco Use  . Smoking status: Never Smoker  . Smokeless tobacco: Never Used  Substance and Sexual Activity  . Alcohol use: Yes    Alcohol/week: 1.2 - 1.8 oz    Types: 2 - 3 Glasses of wine per week  . Drug use: No  . Sexual activity: Not on file  Lifestyle  . Physical activity:    Days per week: Not on file    Minutes per session: Not on file  .  Stress: Not at all  Relationships  . Social connections:    Talks on phone: Not on file    Gets together: Not on file    Attends religious service: Not on file    Active member of club or organization: Not on file    Attends meetings of clubs or organizations: Not on file    Relationship status: Not on file  . Intimate partner violence:    Fear of current or ex partner: Not on file    Emotionally abused: Not on file    Physically abused: Not on file    Forced sexual activity: Not on file  Other Topics Concern  . Not on file  Social History Narrative  . Not on file    Outpatient Encounter Medications as of 07/17/2017  Medication Sig  . ACCU-CHEK FASTCLIX LANCETS MISC Check sugar once daily. DX: E11.9  . azelastine (ASTELIN) 0.1 % nasal spray Place 2 sprays into both  nostrils. Reported on 06/21/2015  . Calcium-Vitamin D-Vitamin K (CALCIUM SOFT CHEWS PO) Take by mouth.  . diazepam (VALIUM) 5 MG tablet TAKE ONE TABLET BY MOUTH NIGHTLY AT BEDTIME AS NEEDED  . glucose blood (ACCU-CHEK SMARTVIEW) test strip Check sugar once daily. DX E11.9  . levothyroxine (SYNTHROID, LEVOTHROID) 75 MCG tablet Take 1 tablet (75 mcg total) by mouth daily.  . Magnesium 250 MG TABS Take 1 tablet by mouth daily.  . metFORMIN (GLUCOPHAGE-XR) 500 MG 24 hr tablet Take 1 tablet (500 mg total) by mouth 2 (two) times daily.  . Multiple Vitamin (MULTIVITAMIN) capsule Take 1 capsule by mouth daily.  . pantoprazole (PROTONIX) 20 MG tablet Take 1 tablet (20 mg total) by mouth daily. (Patient taking differently: Take 40 mg by mouth daily. )  . ranitidine (ZANTAC) 150 MG capsule Take 1 capsule (150 mg total) by mouth 2 (two) times daily.  . rosuvastatin (CRESTOR) 10 MG tablet Take 1 tablet (10 mg total) by mouth at bedtime.  . sertraline (ZOLOFT) 100 MG tablet Take 1 tablet (100 mg total) by mouth daily.   No facility-administered encounter medications on file as of 07/17/2017.     Allergies  Allergen Reactions  . Neomycin-Polymyxin-Gramicidin Other (See Comments)    Other Reaction: Other reaction  . Wilder Glade [Dapagliflozin] Other (See Comments)    Weak and dizzy   . Neosporin [Neomycin-Bacitracin Zn-Polymyx] Swelling  . Thimerosal Swelling  . Zostavax  [Zoster Vaccine Live] Rash    Do Not Administer Zostavax to Pt!    Review of Systems  Constitutional: Negative for fever and malaise/fatigue.  HENT: Negative.   Eyes: Negative.   Respiratory: Negative for cough, shortness of breath and wheezing.   Cardiovascular: Negative for chest pain, palpitations, orthopnea, claudication and leg swelling.  Gastrointestinal: Negative.   Skin: Negative.   Neurological: Negative.   Endo/Heme/Allergies: Negative.   Psychiatric/Behavioral: Negative.     Objective:  There were no vitals taken for  this visit.  Physical Exam  Constitutional: She is oriented to person, place, and time and well-developed, well-nourished, and in no distress.  HENT:  Head: Normocephalic and atraumatic.  Right Ear: External ear normal.  Left Ear: External ear normal.  Nose: Nose normal.  Eyes: Conjunctivae are normal. No scleral icterus.  Neck: No thyromegaly present.  Cardiovascular: Normal rate, regular rhythm and normal heart sounds.  Pulmonary/Chest: Effort normal and breath sounds normal.  Abdominal: Soft.  Musculoskeletal: She exhibits no edema.  Neurological: She is alert and oriented to person, place, and time. Gait  normal. GCS score is 15.  Skin: Skin is warm and dry.  Psychiatric: Mood, memory, affect and judgment normal.    Assessment and Plan :  1. Type 2 diabetes mellitus without complication, without long-term current use of insulin (HCC) Good control. - POCT glycosylated hemoglobin (Hb A1C)--6.7 today - glucose blood (ACCU-CHEK SMARTVIEW) test strip; Check sugar once daily. DX E11.9  Dispense: 100 each; Refill: 12  2. Essential (primary) hypertension  - CBC with Differential/Platelet - Comprehensive metabolic panel  3. Mild major depression (HCC) In remission.  4. Gastroesophageal reflux disease, esophagitis presence not specified   5. Adult hypothyroidism  - TSH  6. Other hyperlipidemia  - Lipid Panel With LDL/HDL Ratio  I have done the exam and reviewed the chart and it is accurate to the best of my knowledge. Development worker, community has been used and  any errors in dictation or transcription are unintentional. Miguel Aschoff M.D. [facility]

## 2017-07-18 LAB — CBC WITH DIFFERENTIAL/PLATELET
BASOS: 0 %
Basophils Absolute: 0 10*3/uL (ref 0.0–0.2)
EOS (ABSOLUTE): 0.1 10*3/uL (ref 0.0–0.4)
Eos: 1 %
HEMATOCRIT: 39.1 % (ref 34.0–46.6)
Hemoglobin: 13.1 g/dL (ref 11.1–15.9)
IMMATURE GRANS (ABS): 0 10*3/uL (ref 0.0–0.1)
Immature Granulocytes: 0 %
LYMPHS: 29 %
Lymphocytes Absolute: 2 10*3/uL (ref 0.7–3.1)
MCH: 29.1 pg (ref 26.6–33.0)
MCHC: 33.5 g/dL (ref 31.5–35.7)
MCV: 87 fL (ref 79–97)
Monocytes Absolute: 0.2 10*3/uL (ref 0.1–0.9)
Monocytes: 4 %
NEUTROS ABS: 4.5 10*3/uL (ref 1.4–7.0)
NEUTROS PCT: 66 %
Platelets: 224 10*3/uL (ref 150–450)
RBC: 4.5 x10E6/uL (ref 3.77–5.28)
RDW: 15.3 % (ref 12.3–15.4)
WBC: 6.9 10*3/uL (ref 3.4–10.8)

## 2017-07-18 LAB — COMPREHENSIVE METABOLIC PANEL
ALBUMIN: 4.6 g/dL (ref 3.5–4.8)
ALT: 22 IU/L (ref 0–32)
AST: 22 IU/L (ref 0–40)
Albumin/Globulin Ratio: 2.3 — ABNORMAL HIGH (ref 1.2–2.2)
Alkaline Phosphatase: 41 IU/L (ref 39–117)
BILIRUBIN TOTAL: 0.3 mg/dL (ref 0.0–1.2)
BUN / CREAT RATIO: 24 (ref 12–28)
BUN: 18 mg/dL (ref 8–27)
CHLORIDE: 100 mmol/L (ref 96–106)
CO2: 21 mmol/L (ref 20–29)
Calcium: 9.2 mg/dL (ref 8.7–10.3)
Creatinine, Ser: 0.75 mg/dL (ref 0.57–1.00)
GFR calc Af Amer: 88 mL/min/{1.73_m2} (ref >59)
GFR calc non Af Amer: 77 mL/min/{1.73_m2} (ref >59)
GLOBULIN, TOTAL: 2 g/dL (ref 1.5–4.5)
Glucose: 141 mg/dL — ABNORMAL HIGH (ref 65–99)
Potassium: 4.5 mmol/L (ref 3.5–5.2)
SODIUM: 137 mmol/L (ref 134–144)
TOTAL PROTEIN: 6.6 g/dL (ref 6.0–8.5)

## 2017-07-18 LAB — LIPID PANEL WITH LDL/HDL RATIO
Cholesterol, Total: 142 mg/dL (ref 100–199)
HDL: 57 mg/dL (ref >39)
LDL Calculated: 59 mg/dL (ref 0–99)
LDl/HDL Ratio: 1 ratio (ref 0.0–3.2)
Triglycerides: 131 mg/dL (ref 0–149)
VLDL CHOLESTEROL CAL: 26 mg/dL (ref 5–40)

## 2017-07-18 LAB — TSH: TSH: 0.621 u[IU]/mL (ref 0.450–4.500)

## 2017-07-19 ENCOUNTER — Ambulatory Visit
Admission: RE | Admit: 2017-07-19 | Discharge: 2017-07-19 | Disposition: A | Discharge status: Home / Self Care | Payer: Medicare Other | Source: Ambulatory Visit | Attending: Family Medicine | Admitting: Family Medicine

## 2017-07-19 DIAGNOSIS — Z1231 Encounter for screening mammogram for malignant neoplasm of breast: Secondary | ICD-10-CM | POA: Insufficient documentation

## 2017-09-09 IMAGING — MG MM  DIGITAL SCREENING BREAST BILAT IMPLANT W/ TOMO W/ CAD
9 of 16 series · 9 of 32 positions shown · non-contrast
Comparison: Previous exam(s).

CLINICAL DATA: Screening.

EXAM:
2D DIGITAL SCREENING BILATERAL MAMMOGRAM WITH IMPLANTS, CAD AND
ADJUNCT TOMO
The patient has retropectoral implants. Standard and implant
displaced views were performed.

[R MLO (1 of 2)]
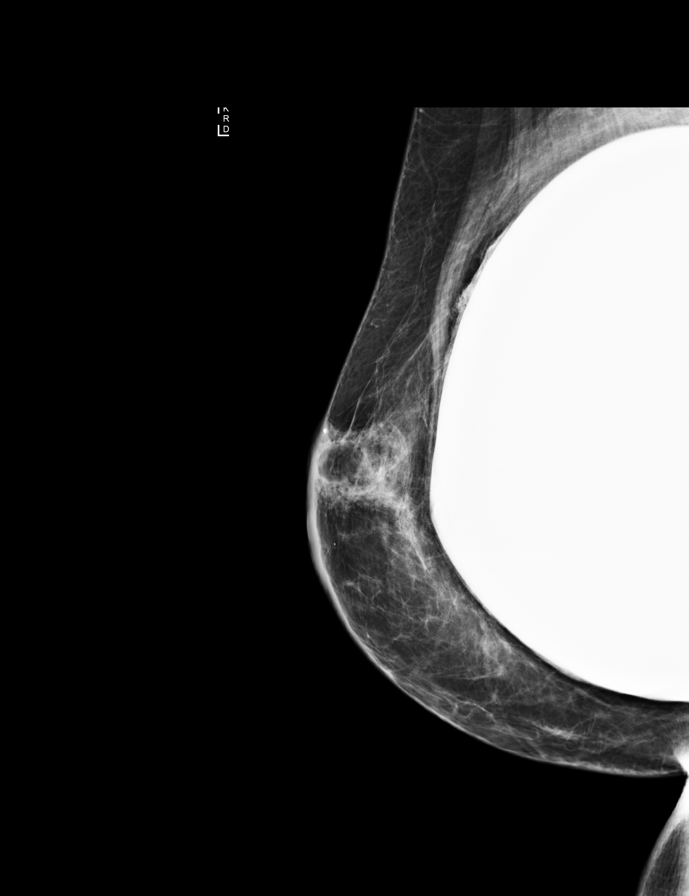

[L MLO]
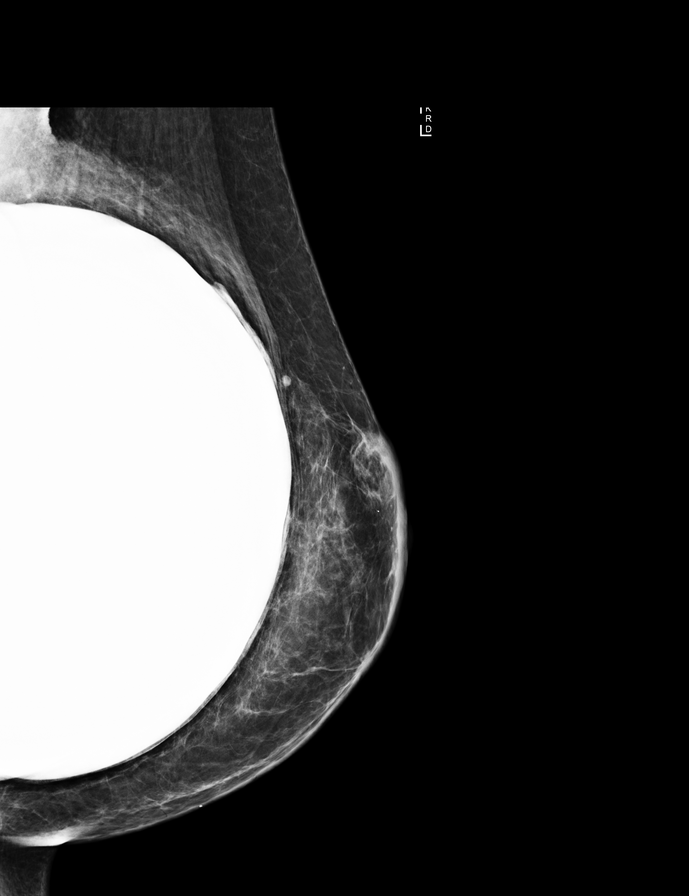

[R CC (1 of 2)]
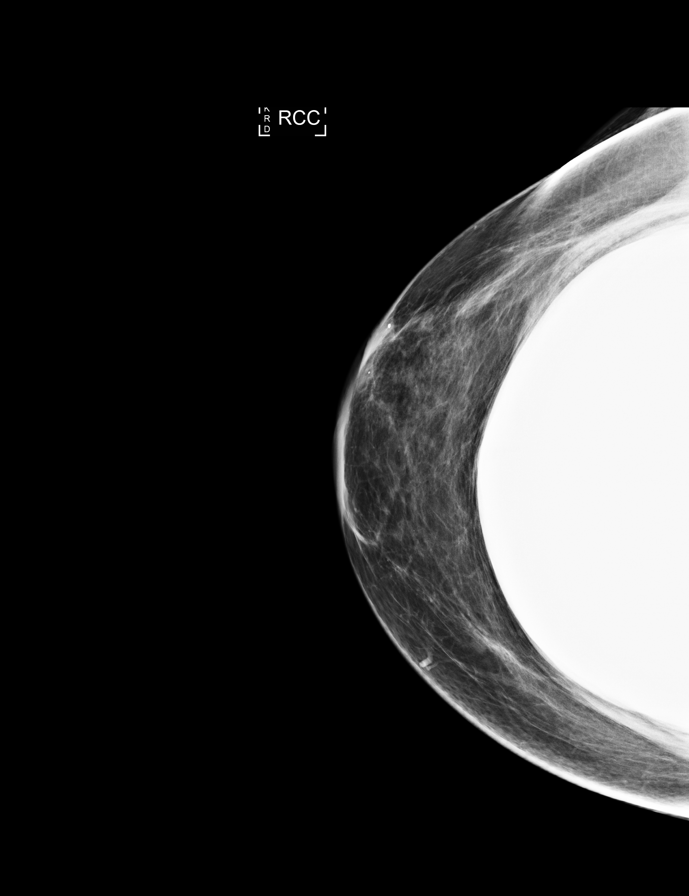

[L CC]
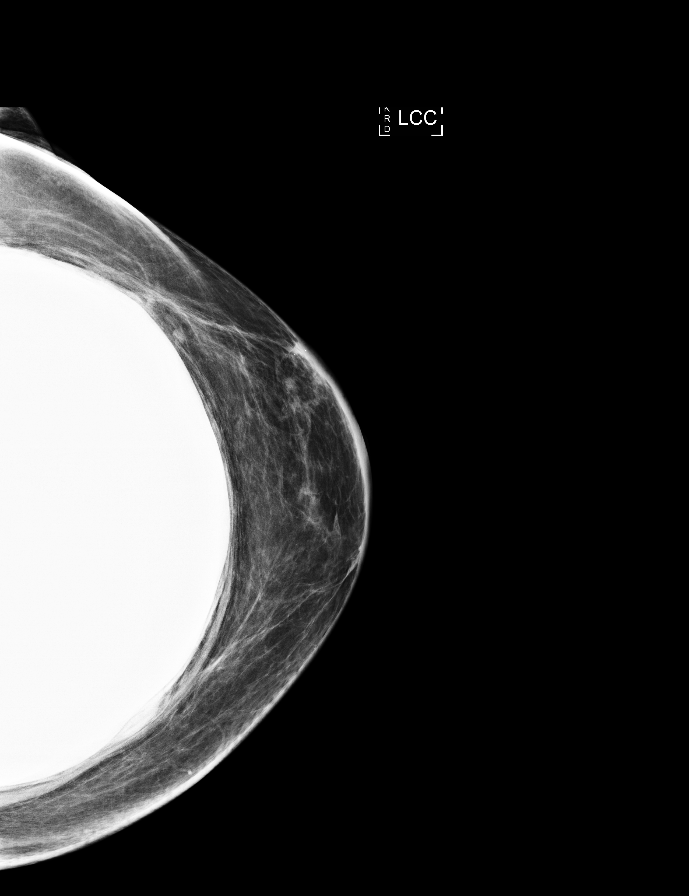

[R MLO synth-2D]
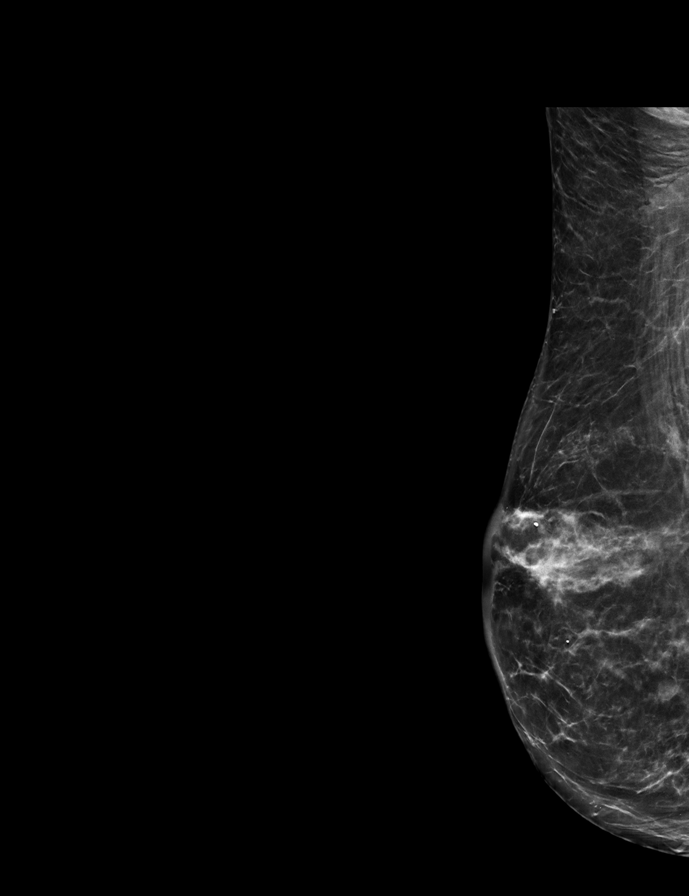

[R CC (2 of 2)]
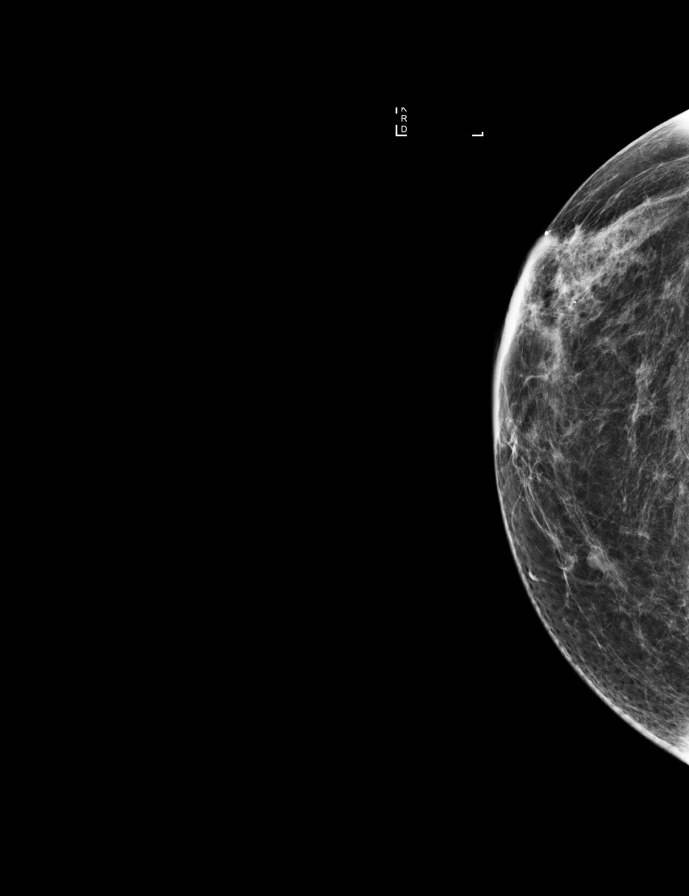

[R MLO (2 of 2)]
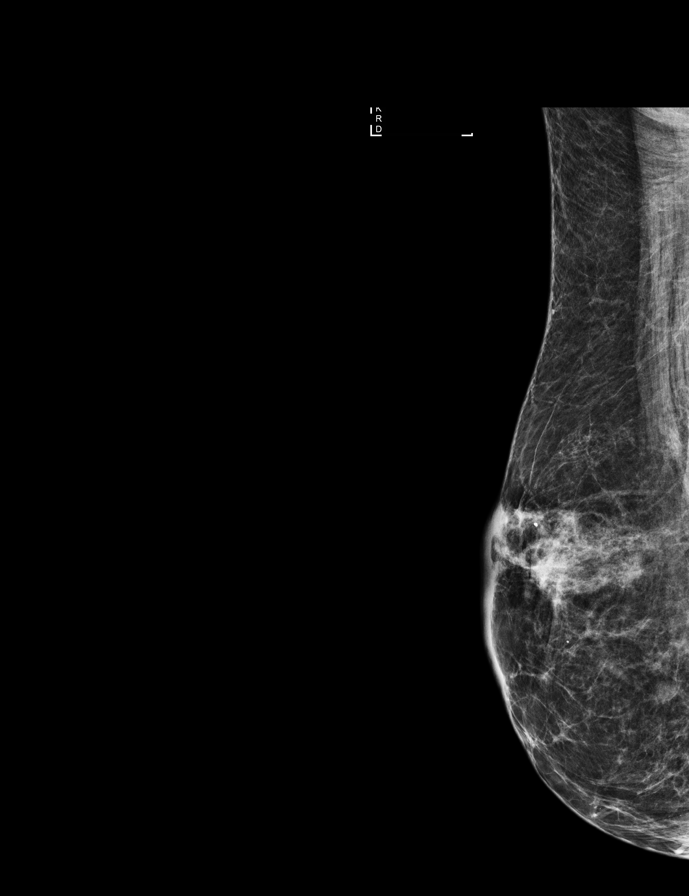

[R CC synth-2D]
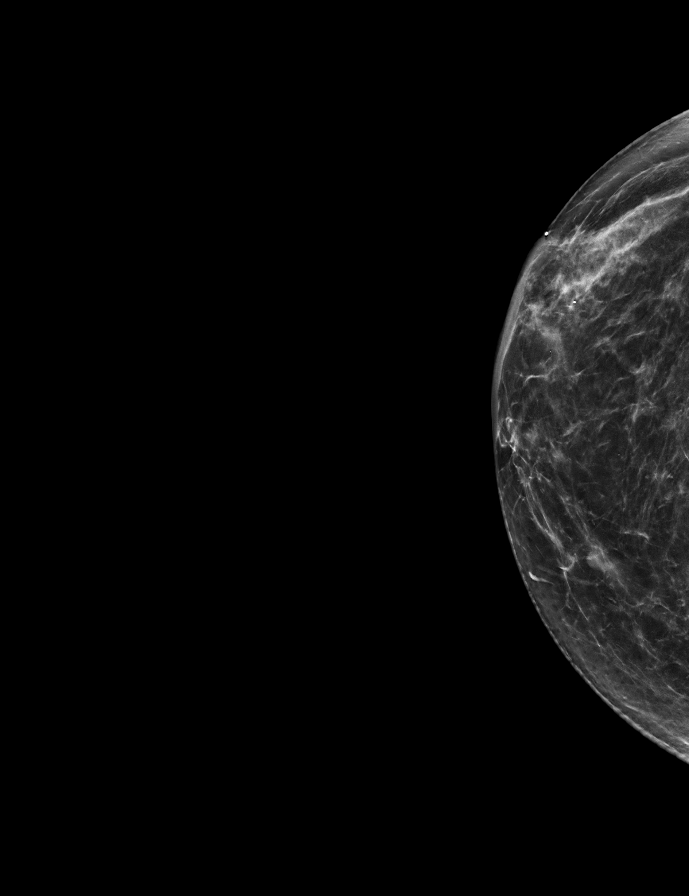

[L MLO synth-2D]
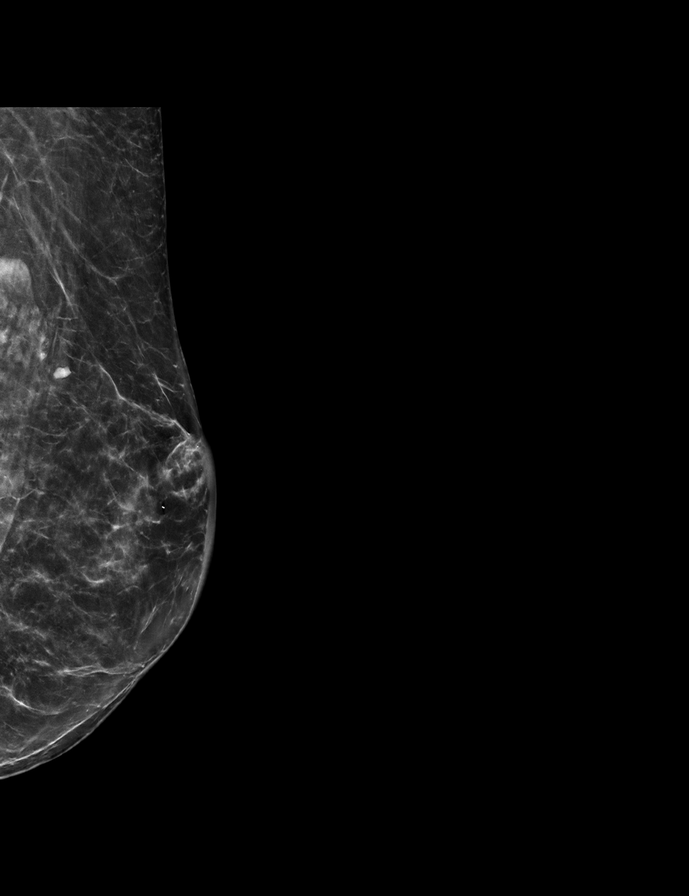

[9 of 32 positions shown; findings below may reference images not displayed]

ACR Breast Density Category b: There are scattered areas of
fibroglandular density.
FINDINGS: There are no findings suspicious for malignancy. Postsurgical
changes bilaterally. Images were processed with CAD.
IMPRESSION: No mammographic evidence of malignancy. A result letter of this
screening mammogram will be mailed directly to the patient.

RECOMMENDATION:
Screening mammogram in one year. (Code:[id number]-[id number])

BI-RADS CATEGORY  1:  Negative.

## 2017-10-10 DIAGNOSIS — M1711 Unilateral primary osteoarthritis, right knee: Secondary | ICD-10-CM | POA: Diagnosis not present

## 2017-10-10 DIAGNOSIS — M25562 Pain in left knee: Secondary | ICD-10-CM | POA: Diagnosis not present

## 2017-10-10 DIAGNOSIS — M179 Osteoarthritis of knee, unspecified: Secondary | ICD-10-CM | POA: Insufficient documentation

## 2017-10-10 HISTORY — DX: Osteoarthritis of knee, unspecified: M17.9

## 2017-10-24 DIAGNOSIS — M1711 Unilateral primary osteoarthritis, right knee: Secondary | ICD-10-CM | POA: Diagnosis not present

## 2017-11-10 DIAGNOSIS — Z23 Encounter for immunization: Secondary | ICD-10-CM | POA: Diagnosis not present

## 2017-11-20 ENCOUNTER — Encounter: Payer: Self-pay | Admitting: Family Medicine

## 2017-11-20 ENCOUNTER — Ambulatory Visit (INDEPENDENT_AMBULATORY_CARE_PROVIDER_SITE_OTHER): Payer: Medicare Other | Admitting: Family Medicine

## 2017-11-20 VITALS — BP 126/62 | HR 60 | Temp 97.9°F | Resp 16 | Ht 60.0 in | Wt 146.0 lb

## 2017-11-20 DIAGNOSIS — E119 Type 2 diabetes mellitus without complications: Secondary | ICD-10-CM | POA: Diagnosis not present

## 2017-11-20 DIAGNOSIS — I10 Essential (primary) hypertension: Secondary | ICD-10-CM

## 2017-11-20 DIAGNOSIS — G2581 Restless legs syndrome: Secondary | ICD-10-CM | POA: Diagnosis not present

## 2017-11-20 DIAGNOSIS — F320 Major depressive disorder, single episode, mild: Secondary | ICD-10-CM

## 2017-11-20 LAB — POCT GLYCOSYLATED HEMOGLOBIN (HGB A1C): Hemoglobin A1C: 7 % — AB (ref 4.0–5.6)

## 2017-11-20 NOTE — Progress Notes (Signed)
Patient: Nicole Young Female    DOB: 1940/08/07   77 y.o.   MRN: [id number] Visit Date: 11/20/2017  Today's Provider: [name], MD   Chief Complaint  Patient presents with  . Diabetes  . Hypertension   Subjective:    HPI  Diabetes Mellitus Type II, Follow-up:   Lab Results  Component Value Date   HGBA1C 7.0 (A) 11/20/2017   HGBA1C 6.7 (A) 07/17/2017   HGBA1C 6.7 02/20/2017    Last seen for diabetes 4 months ago.  Management since then includes no changes. She reports good compliance with treatment. She is not having side effects.  Current symptoms include none and have been stable. Home blood sugar records: fasting range: 120s  Episodes of hypoglycemia? no   Current Insulin Regimen: none Most Recent Eye Exam: up date Weight trend: stable Prior visit with dietician: no Current diet: well balanced Current exercise: no regular exercise  Pertinent Labs:    Component Value Date/Time   CHOL 142 07/17/2017 0908   TRIG 131 07/17/2017 0908   HDL 57 07/17/2017 0908   LDLCALC 59 07/17/2017 0908   CREATININE 0.75 07/17/2017 0908    Wt Readings from Last 3 Encounters:  11/20/17 146 lb (66.2 kg)  07/17/17 147 lb (66.7 kg)  05/18/17 146 lb 3.2 oz (66.3 kg)     Hypertension, follow-up:  BP Readings from Last 3 Encounters:  11/20/17 126/62  07/17/17 118/64  05/18/17 130/70    She was last seen for hypertension 4 months ago.  BP at that visit was 118/64. Management since that visit includes no changes. She reports good compliance with treatment. She is not having side effects.  She is exercising. She is adherent to low salt diet.   Outside blood pressures are checked occasionally. She is experiencing none.  Patient denies exertional chest pressure/discomfort, lower extremity edema and palpitations.   Cardiovascular risk factors include diabetes mellitus and dyslipidemia. =    Allergies  Allergen Reactions  .  Neomycin-Polymyxin-Gramicidin Other (See Comments)    Other Reaction: Other reaction  . Wilder Glade [Dapagliflozin] Other (See Comments)    Weak and dizzy   . Neosporin [Neomycin-Bacitracin Zn-Polymyx] Swelling  . Thimerosal Swelling  . Zostavax  [Zoster Vaccine Live] Rash    Do Not Administer Zostavax to Pt!     Current Outpatient Medications:  .  azelastine (ASTELIN) 0.1 % nasal spray, Place 2 sprays into both nostrils. Reported on 06/21/2015, Disp: , Rfl:  .  Calcium-Vitamin D-Vitamin K (CALCIUM SOFT CHEWS PO), Take by mouth., Disp: , Rfl:  .  diazepam (VALIUM) 5 MG tablet, TAKE ONE TABLET BY MOUTH NIGHTLY AT BEDTIME AS NEEDED, Disp: 30 tablet, Rfl: 1 .  glucose blood (ACCU-CHEK SMARTVIEW) test strip, Check sugar once daily. DX E11.9, Disp: 100 each, Rfl: 12 .  levothyroxine (SYNTHROID, LEVOTHROID) 75 MCG tablet, Take 1 tablet (75 mcg total) by mouth daily., Disp: 90 tablet, Rfl: 3 .  Magnesium 250 MG TABS, Take 1 tablet by mouth daily., Disp: , Rfl:  .  metFORMIN (GLUCOPHAGE-XR) 500 MG 24 hr tablet, Take 1 tablet (500 mg total) by mouth 2 (two) times daily. (Patient taking differently: Take 500 mg by mouth See admin instructions. Patient takes 1000 with her dinner meal), Disp: 180 tablet, Rfl: 3 .  Multiple Vitamin (MULTIVITAMIN) capsule, Take 1 capsule by mouth daily., Disp: , Rfl:  .  pantoprazole (PROTONIX) 20 MG tablet, Take 1 tablet (20 mg total) by mouth daily. (Patient  taking differently: Take 40 mg by mouth daily. ), Disp: 90 tablet, Rfl: 3 .  rosuvastatin (CRESTOR) 10 MG tablet, Take 1 tablet (10 mg total) by mouth at bedtime., Disp: 90 tablet, Rfl: 3 .  sertraline (ZOLOFT) 100 MG tablet, Take 1 tablet (100 mg total) by mouth daily., Disp: 90 tablet, Rfl: 3 .  ACCU-CHEK FASTCLIX LANCETS MISC, Check sugar once daily. DX: E11.9, Disp: 102 each, Rfl: 12  Review of Systems  Constitutional: Negative.   Respiratory: Negative for cough and shortness of breath.   Cardiovascular: Negative.    Endocrine: Negative.   Musculoskeletal: Negative.   Neurological: Negative.   Hematological: Negative.   Psychiatric/Behavioral: Negative.     Social History   Tobacco Use  . Smoking status: Never Smoker  . Smokeless tobacco: Never Used  Substance Use Topics  . Alcohol use: Yes    Alcohol/week: 2.0 - 3.0 standard drinks    Types: 2 - 3 Glasses of wine per week   Objective:   BP 126/62   Pulse 60   Temp 97.9 F (36.6 C)   Resp 16   Ht 5' (1.524 m)   Wt 146 lb (66.2 kg)   SpO2 96%   BMI 28.51 kg/m  Vitals:   11/20/17 1330  BP: 126/62  Pulse: 60  Resp: 16  Temp: 97.9 F (36.6 C)  SpO2: 96%  Weight: 146 lb (66.2 kg)  Height: 5' (1.524 m)     Physical Exam  Constitutional: She is oriented to person, place, and time. She appears well-developed and well-nourished.  HENT:  Head: Normocephalic and atraumatic.  Right Ear: External ear normal.  Left Ear: External ear normal.  Nose: Nose normal.  Eyes: Conjunctivae are normal. No scleral icterus.  Neck: No thyromegaly present.  Cardiovascular: Normal rate, regular rhythm and normal heart sounds.  Pulmonary/Chest: Effort normal and breath sounds normal.  Abdominal: Soft.  Neurological: She is alert and oriented to person, place, and time.  Skin: Skin is warm and dry.  Psychiatric: She has a normal mood and affect. Her behavior is normal. Judgment and thought content normal.        Assessment & Plan:     1. Type 2 diabetes mellitus without complication, without long-term current use of insulin (HCC)  - POCT glycosylated hemoglobin (Hb A1C)--7.0 --was 6.7. - glucose blood (ACCU-CHEK SMARTVIEW) test strip; Check sugar three times daily. DX E11.9  Dispense: 100 each; Refill: 12  2. Restless leg Improved with treatment. - diazepam (VALIUM) 5 MG tablet; TAKE ONE TABLET BY MOUTH NIGHTLY AT BEDTIME AS NEEDED  Dispense: 30 tablet; Refill: 1  3. Essential (primary) hypertension   4. Mild major depression  ([employer])  I have done the exam and reviewed the chart and it is accurate to the best of my knowledge. Development worker, community has been used and  any errors in dictation or transcription are unintentional. Miguel Aschoff M.D. [place], [place]  [facility]

## 2017-11-24 MED ORDER — DIAZEPAM 5 MG PO TABS: ORAL_TABLET | ORAL | 1 refills | Status: DC

## 2017-11-24 MED ORDER — GLUCOSE BLOOD VI STRP: ORAL_STRIP | 12 refills | Status: DC

## 2018-01-18 ENCOUNTER — Telehealth: Payer: Self-pay

## 2018-01-18 NOTE — Telephone Encounter (Signed)
LMTCB and schedule AWV. Last AWV was 01/22/17. -MM 

## 2018-02-04 NOTE — Telephone Encounter (Signed)
AWV scheduled for 02/08/18 @ 10. -MM

## 2018-02-08 ENCOUNTER — Ambulatory Visit (INDEPENDENT_AMBULATORY_CARE_PROVIDER_SITE_OTHER): Payer: Medicare Other

## 2018-02-08 VITALS — BP 132/56 | HR 89 | Temp 97.9°F | Ht 60.0 in | Wt 146.4 lb

## 2018-02-08 DIAGNOSIS — Z0000 Encounter for general adult medical examination without abnormal findings: Secondary | ICD-10-CM

## 2018-02-08 DIAGNOSIS — E2839 Other primary ovarian failure: Secondary | ICD-10-CM | POA: Diagnosis not present

## 2018-02-08 DIAGNOSIS — Z13820 Encounter for screening for osteoporosis: Secondary | ICD-10-CM

## 2018-02-08 NOTE — Progress Notes (Signed)
Subjective:   Nicole Young is a 78 y.o. female who presents for Medicare Annual (Subsequent) preventive examination.  Review of Systems:  N/A  Cardiac Risk Factors include: advanced age (>30men, >71 women);dyslipidemia;diabetes mellitus     Objective:     Vitals: BP (!) 132/56 (BP Location: Right Arm)   Pulse 89   Temp 97.9 F (36.6 C) (Oral)   Ht 5' (1.524 m)   Wt 146 lb 6.4 oz (66.4 kg)   BMI 28.59 kg/m   Body mass index is 28.59 kg/m.  Advanced Directives 02/08/2018 01/22/2017 12/16/2015 12/16/2015 12/07/2015 05/13/2015 11/17/2014  Does Patient Have a Medical Advance Directive? Yes Yes No No No Yes No  Type of Advance Directive Healthcare Power of March ARB -  Does patient want to make changes to medical advance directive? - - - - - No - Patient declined -  Copy of [place] in Chart? No - copy requested No - copy requested - - - No - copy requested -  Would patient like information on creating a medical advance directive? - - - No - Patient declined - - -    Tobacco Social History   Tobacco Use  Smoking Status Never Smoker  Smokeless Tobacco Never Used     Counseling given: Not Answered   Clinical Intake:  Pre-visit preparation completed: Yes  Pain : No/denies pain Pain Score: 0-No pain    Diabetes:  Is the patient diabetic?  Yes type 2 If diabetic, was a CBG obtained today?  No  Did the patient bring in their glucometer from home?  No  How often do you monitor your CBG's? Once daily.   Financial Strains and Diabetes Management:  Are you having any financial strains with the device, your supplies or your medication? No .  Does the patient want to be seen by Chronic Care Management for management of their diabetes?  No  Would the patient like to be referred to a Nutritionist or for Diabetic Management?  No   Diabetic Exams:  Diabetic Eye Exam: Completed 03/06/17.    Diabetic Foot Exam: Completed 10/17/16. Pt has been advised about the importance in completing this exam. Note made to f/u on this at next OV with PCP.    Nutritional Status: BMI 25 -29 Overweight Nutritional Risks: None  How often do you need to have someone help you when you read instructions, pamphlets, or other written materials from your doctor or pharmacy?: 1 - Never  Interpreter Needed?: No  Information entered by :: [facility], LPN  Past Medical History:  Diagnosis Date  . Complication of anesthesia    slow to wake up in past  . Depression    controlled  . Diabetes mellitus without complication ([place])    controlled  . Hypothyroidism   . Neuropathy   . Osteoarthritis    knee, ankles, shoulders, back, neck, hands/fingers, wrists;   . Rectocele   . Thyroid disease    Past Surgical History:  Procedure Laterality Date  . AUGMENTATION MAMMAPLASTY  1975  . AUGMENTATION MAMMAPLASTY  2017   2nd augmentation and lift  . BREAST SURGERY     impants replaced with lift  . CARPAL TUNNEL RELEASE Right 08/26/2014   Procedure: CARPAL TUNNEL RELEASE;  Surgeon: Earnestine Leys, MD;  Location: [facility] ORS;  Service: Orthopedics;  Laterality: Right;  . COLONOSCOPY  2005   Dr Bary Castilla  . COLONOSCOPY N/A 06/24/2014  Procedure: COLONOSCOPY;  Surgeon: Robert Bellow, MD;  Location: [facility] ENDOSCOPY;  Service: Endoscopy;  Laterality: N/A;  . PARATHYROIDECTOMY  1992  . PLACEMENT OF BREAST IMPLANTS  1975  . TONSILECTOMY, ADENOIDECTOMY, BILATERAL MYRINGOTOMY AND TUBES     Family History  Problem Relation Age of Onset  . Cancer Father        prostate  . Prostate cancer Father   . Hypertension Mother   . Heart attack Mother   . Heart attack Maternal Grandfather   . Diabetes Paternal Uncle   . Congestive Heart Failure Maternal Grandmother   . CVA Paternal Grandmother   . Prostate cancer Paternal Grandfather   . Breast cancer Maternal Aunt    Social History   Socioeconomic History  .  Marital status: Widowed    Spouse name: Not on file  . Number of children: 3  . Years of education: Not on file  . Highest education level: Bachelor's degree (e.g., BA, AB, BS)  Occupational History  . Occupation: retired  Scientific laboratory technician  . Financial resource strain: Not hard at all  . Food insecurity:    Worry: Never true    Inability: Never true  . Transportation needs:    Medical: No    Non-medical: No  Tobacco Use  . Smoking status: Never Smoker  . Smokeless tobacco: Never Used  Substance and Sexual Activity  . Alcohol use: Yes    Alcohol/week: 2.0 - 3.0 standard drinks    Types: 2 - 3 Glasses of wine per week  . Drug use: No  . Sexual activity: Not on file  Lifestyle  . Physical activity:    Days per week: 0 days    Minutes per session: 0 min  . Stress: Not at all  Relationships  . Social connections:    Talks on phone: Patient refused    Gets together: Patient refused    Attends religious service: Patient refused    Active member of club or organization: Patient refused    Attends meetings of clubs or organizations: Patient refused    Relationship status: Patient refused  Other Topics Concern  . Not on file  Social History Narrative  . Not on file    Outpatient Encounter Medications as of 02/08/2018  Medication Sig  . ACCU-CHEK FASTCLIX LANCETS MISC Check sugar once daily. DX: E11.9  . AZO-CRANBERRY PO Take 2 tablets by mouth daily.  . Calcium-Vitamin D-Vitamin K [id number] MG-UNT-MCG CHEW Chew by mouth.  . cholecalciferol (VITAMIN D3) 25 MCG (1000 UT) tablet Take by mouth daily. Unsure of dose  . diazepam (VALIUM) 5 MG tablet TAKE ONE TABLET BY MOUTH NIGHTLY AT BEDTIME AS NEEDED  . glucose blood (ACCU-CHEK SMARTVIEW) test strip Check sugar three times daily. DX E11.9  . levothyroxine (SYNTHROID, LEVOTHROID) 75 MCG tablet Take 1 tablet (75 mcg total) by mouth daily.  . metFORMIN (GLUCOPHAGE-XR) 500 MG 24 hr tablet Take 1 tablet (500 mg total) by mouth 2 (two)  times daily. (Patient taking differently: Take 500 mg by mouth See admin instructions. Patient takes 1000 with her dinner meal)  . Multiple Vitamin (MULTIVITAMIN) capsule Take 1 capsule by mouth daily.  . NON FORMULARY Drinks Turmeric tea 2-3 times a day.  . pantoprazole (PROTONIX) 20 MG tablet Take 1 tablet (20 mg total) by mouth daily.  . Phenazopyridine HCl (AZO STANDARD MAXIMUM STRENGTH PO) Take 1 tablet by mouth daily.  . rosuvastatin (CRESTOR) 10 MG tablet Take 1 tablet (10 mg total) by mouth at  bedtime.  . sertraline (ZOLOFT) 100 MG tablet Take 1 tablet (100 mg total) by mouth daily.  Marland Kitchen azelastine (ASTELIN) 0.1 % nasal spray Place 2 sprays into both nostrils. Reported on 06/21/2015  . Calcium-Vitamin D-Vitamin K (CALCIUM SOFT CHEWS PO) Take by mouth.  . Magnesium 250 MG TABS Take 1 tablet by mouth daily.   No facility-administered encounter medications on file as of 02/08/2018.     Activities of Daily Living In your present state of health, do you have any difficulty performing the following activities: 02/08/2018  Hearing? Y  Comment Does not wear hearing aids.   Vision? N  Difficulty concentrating or making decisions? N  Walking or climbing stairs? N  Dressing or bathing? N  Doing errands, shopping? N  Preparing Food and eating ? N  Using the Toilet? N  In the past six months, have you accidently leaked urine? Y  Comment Occasionally with urges.  Do you have problems with loss of bowel control? N  Managing your Medications? N  Managing your Finances? N  Housekeeping or managing your Housekeeping? N  Some recent data might be hidden    Patient Care Team: Jerrol Banana., MD as PCP - General (Family Medicine) Birder Robson, MD as Referring Physician (Ophthalmology) Garrel Ridgel, DPM as Consulting Physician (Podiatry)    Assessment:   This is a routine wellness examination for [place].  Exercise Activities and Dietary recommendations Current Exercise Habits:  The patient does not participate in regular exercise at present, Exercise limited by: None identified  Goals    . DIET - INCREASE WATER INTAKE     Continue drinking 40 oz of water a day and increase when able to.     . Increase water intake     Starting 12/16/15,  I will continue drinking 5 glasses of water a day.       Fall Risk Fall Risk  02/08/2018 07/17/2017 01/22/2017 12/16/2015 05/17/2015  Falls in the past year? 0 No Yes No No  Number falls in past yr: - - 1 - -  Comment - - due to shoelaces getting stuck in moving walkway. - -  Injury with Fall? - - Yes - -  Comment - - broken toe - -   FALL RISK PREVENTION PERTAINING TO THE HOME:  Any stairs in or around the home WITH handrails? No  Home free of loose throw rugs in walkways, pet beds, electrical cords, etc? Yes  Adequate lighting in your home to reduce risk of falls? Yes   ASSISTIVE DEVICES UTILIZED TO PREVENT FALLS:  Life alert? No  Use of a cane, walker or w/c? No  Grab bars in the bathroom? No  Shower chair or bench in shower? No  Elevated toilet seat or a handicapped toilet? Yes    TIMED UP AND GO:  Was the test performed? No .    Depression Screen PHQ 2/9 Scores 02/08/2018 07/17/2017 01/22/2017 10/17/2016  PHQ - 2 Score 0 0 1 0  PHQ- 9 Score - 0 - 0     Cognitive Function: Declined today.      6CIT Screen 12/16/2015  What Year? 0 points  What month? 0 points  What time? 0 points  Count back from 20 0 points  Months in reverse 0 points  Repeat phrase 0 points  Total Score 0    Immunization History  Administered Date(s) Administered  . Influenza, High Dose Seasonal PF 10/17/2016, 11/10/2017  . Influenza-Unspecified 10/23/2014  . Pneumococcal  Conjugate-13 12/16/2013  . Pneumococcal Polysaccharide-23 12/23/2015  . Tdap 02/14/2013    Qualifies for Shingles Vaccine? Yes . Due for Shingrix. Education has been provided regarding the importance of this vaccine. Pt has been advised to call insurance company  to determine out of pocket expense. Advised may also receive vaccine at local pharmacy or Health Dept. Verbalized acceptance and understanding.  Tdap: Up to date  Flu Vaccine: Up to date  Pneumococcal Vaccine: Up to date   Screening Tests Health Maintenance  Topic Date Due  . DEXA SCAN  07/24/2012  . FOOT EXAM  10/17/2017  . URINE MICROALBUMIN  10/17/2017  . OPHTHALMOLOGY EXAM  03/06/2018  . HEMOGLOBIN A1C  05/21/2018  . TETANUS/TDAP  02/15/2023  . INFLUENZA VACCINE  Completed  . PNA vac Low Risk Adult  Completed    Cancer Screenings:  Colorectal Screening: No longer required.   Mammogram: No longer required.   Bone Density: Completed 07/21/11. Results reflect OSTEOPENIA. Repeat every 5 years. Ordered today. Pt aware the office will call re: appt.  Lung Cancer Screening: (Low Dose CT Chest recommended if Age 48-80 years, 30 pack-year currently smoking OR have quit w/in 15years.) does not qualify.    Additional Screening:  Vision Screening: Recommended annual ophthalmology exams for early detection of glaucoma and other disorders of the eye.  Dental Screening: Recommended annual dental exams for proper oral hygiene  Community Resource Referral:  CRR required this visit?  No       Plan:  I have personally reviewed and addressed the Medicare Annual Wellness questionnaire and have noted the following in the patient's chart:  A. Medical and social history B. Use of alcohol, tobacco or illicit drugs  C. Current medications and supplements D. Functional ability and status E.  Nutritional status F.  Physical activity G. Advance directives H. List of other physicians I.  Hospitalizations, surgeries, and ER visits in previous 12 months J.  [place] such as hearing and vision if needed, cognitive and depression L. Referrals and appointments - none  In addition, I have reviewed and discussed with patient certain preventive protocols, quality metrics, and  best practice recommendations. A written personalized care plan for preventive services as well as general preventive health recommendations were provided to patient.  See attached scanned questionnaire for additional information.   Signed,  Fabio Neighbors, LPN Nurse Health Advisor   Nurse Recommendations: Pt needs a diabetic foot exam and urine microalbumin check at next OV.

## 2018-02-08 NOTE — Patient Instructions (Addendum)
Nicole Young , Thank you for taking time to come for your Medicare Wellness Visit. I appreciate your ongoing commitment to your health goals. Please review the following plan we discussed and let me know if I can assist you in the future.   Screening recommendations/referrals: Colonoscopy: No longer required.  Mammogram: No longer required.  Bone Density: Ordered today. Pt aware the office will call re: appt. Recommended yearly ophthalmology/optometry visit for glaucoma screening and checkup Recommended yearly dental visit for hygiene and checkup  Vaccinations: Influenza vaccine: Up to date Pneumococcal vaccine: Completed series Tdap vaccine: Up to date, due 02/2023 Shingles vaccine: Pt declines today.     Advanced directives: Please bring a copy of your POA (Power of Attorney) and/or Living Will to your next appointment.   Conditions/risks identified: Continue increasing water intake to 6-8 8 oz glasses a day.   Next appointment: 03/19/18 with Dr Rosanna Randy. Declined scheduling the AWV for 2021 at this time.    Preventive Care 19 Years and Older, Female Preventive care refers to lifestyle choices and visits with your health care provider that can promote health and wellness. What does preventive care include?  A yearly physical exam. This is also called an annual well check.  Dental exams once or twice a year.  Routine eye exams. Ask your health care provider how often you should have your eyes checked.  Personal lifestyle choices, including:  Daily care of your teeth and gums.  Regular physical activity.  Eating a healthy diet.  Avoiding tobacco and drug use.  Limiting alcohol use.  Practicing safe sex.  Taking low-dose aspirin every day.  Taking vitamin and mineral supplements as recommended by your health care provider. What happens during an annual well check? The services and screenings done by your health care provider during your annual well check will depend on  your age, overall health, lifestyle risk factors, and family history of disease. Counseling  Your health care provider may ask you questions about your:  Alcohol use.  Tobacco use.  Drug use.  Emotional well-being.  Home and relationship well-being.  Sexual activity.  Eating habits.  History of falls.  Memory and ability to understand (cognition).  Work and work Statistician.  Reproductive health. Screening  You may have the following tests or measurements:  Height, weight, and BMI.  Blood pressure.  Lipid and cholesterol levels. These may be checked every 5 years, or more frequently if you are over 24 years old.  Skin check.  Lung cancer screening. You may have this screening every year starting at age 60 if you have a 30-pack-year history of smoking and currently smoke or have quit within the past 15 years.  Fecal occult blood test (FOBT) of the stool. You may have this test every year starting at age 28.  Flexible sigmoidoscopy or colonoscopy. You may have a sigmoidoscopy every 5 years or a colonoscopy every 10 years starting at age 11.  Hepatitis C blood test.  Hepatitis B blood test.  Sexually transmitted disease (STD) testing.  Diabetes screening. This is done by checking your blood sugar (glucose) after you have not eaten for a while (fasting). You may have this done every 1-3 years.  Bone density scan. This is done to screen for osteoporosis. You may have this done starting at age 32.  Mammogram. This may be done every 1-2 years. Talk to your health care provider about how often you should have regular mammograms. Talk with your health care provider about your test  results, treatment options, and if necessary, the need for more tests. Vaccines  Your health care provider may recommend certain vaccines, such as:  Influenza vaccine. This is recommended every year.  Tetanus, diphtheria, and acellular pertussis (Tdap, Td) vaccine. You may need a Td booster  every 10 years.  Zoster vaccine. You may need this after age 27.  Pneumococcal 13-valent conjugate (PCV13) vaccine. One dose is recommended after age 65.  Pneumococcal polysaccharide (PPSV23) vaccine. One dose is recommended after age 30. Talk to your health care provider about which screenings and vaccines you need and how often you need them. This information is not intended to replace advice given to you by your health care provider. Make sure you discuss any questions you have with your health care provider. Document Released: 01/22/2015 Document Revised: 09/15/2015 Document Reviewed: 10/27/2014 Elsevier Interactive Patient Education  2017 [place] Prevention in the Home Falls can cause injuries. They can happen to people of all ages. There are many things you can do to make your home safe and to help prevent falls. What can I do on the outside of my home?  Regularly fix the edges of walkways and driveways and fix any cracks.  Remove anything that might make you trip as you walk through a door, such as a raised step or threshold.  Trim any bushes or trees on the path to your home.  Use bright outdoor lighting.  Clear any walking paths of anything that might make someone trip, such as rocks or tools.  Regularly check to see if handrails are loose or broken. Make sure that both sides of any steps have handrails.  Any raised decks and porches should have guardrails on the edges.  Have any leaves, snow, or ice cleared regularly.  Use sand or salt on walking paths during winter.  Clean up any spills in your garage right away. This includes oil or grease spills. What can I do in the bathroom?  Use night lights.  Install grab bars by the toilet and in the tub and shower. Do not use towel bars as grab bars.  Use non-skid mats or decals in the tub or shower.  If you need to sit down in the shower, use a plastic, non-slip stool.  Keep the floor dry. Clean up any  water that spills on the floor as soon as it happens.  Remove soap buildup in the tub or shower regularly.  Attach bath mats securely with double-sided non-slip rug tape.  Do not have throw rugs and other things on the floor that can make you trip. What can I do in the bedroom?  Use night lights.  Make sure that you have a light by your bed that is easy to reach.  Do not use any sheets or blankets that are too big for your bed. They should not hang down onto the floor.  Have a firm chair that has side arms. You can use this for support while you get dressed.  Do not have throw rugs and other things on the floor that can make you trip. What can I do in the kitchen?  Clean up any spills right away.  Avoid walking on wet floors.  Keep items that you use a lot in easy-to-reach places.  If you need to reach something above you, use a strong step stool that has a grab bar.  Keep electrical cords out of the way.  Do not use floor polish or wax that makes  floors slippery. If you must use wax, use non-skid floor wax.  Do not have throw rugs and other things on the floor that can make you trip. What can I do with my stairs?  Do not leave any items on the stairs.  Make sure that there are handrails on both sides of the stairs and use them. Fix handrails that are broken or loose. Make sure that handrails are as long as the stairways.  Check any carpeting to make sure that it is firmly attached to the stairs. Fix any carpet that is loose or worn.  Avoid having throw rugs at the top or bottom of the stairs. If you do have throw rugs, attach them to the floor with carpet tape.  Make sure that you have a light switch at the top of the stairs and the bottom of the stairs. If you do not have them, ask someone to add them for you. What else can I do to help prevent falls?  Wear shoes that:  Do not have high heels.  Have rubber bottoms.  Are comfortable and fit you well.  Are closed  at the toe. Do not wear sandals.  If you use a stepladder:  Make sure that it is fully opened. Do not climb a closed stepladder.  Make sure that both sides of the stepladder are locked into place.  Ask someone to hold it for you, if possible.  Clearly mark and make sure that you can see:  Any grab bars or handrails.  First and last steps.  Where the edge of each step is.  Use tools that help you move around (mobility aids) if they are needed. These include:  Canes.  Walkers.  Scooters.  Crutches.  Turn on the lights when you go into a dark area. Replace any light bulbs as soon as they burn out.  Set up your furniture so you have a clear path. Avoid moving your furniture around.  If any of your floors are uneven, fix them.  If there are any pets around you, be aware of where they are.  Review your medicines with your doctor. Some medicines can make you feel dizzy. This can increase your chance of falling. Ask your doctor what other things that you can do to help prevent falls. This information is not intended to replace advice given to you by your health care provider. Make sure you discuss any questions you have with your health care provider. Document Released: 10/22/2008 Document Revised: 06/03/2015 Document Reviewed: 01/30/2014 Elsevier Interactive Patient Education  2017 [employer].

## 2018-02-22 ENCOUNTER — Other Ambulatory Visit: Payer: Self-pay | Admitting: Family Medicine

## 2018-02-22 DIAGNOSIS — F320 Major depressive disorder, single episode, mild: Secondary | ICD-10-CM

## 2018-03-05 DIAGNOSIS — D225 Melanocytic nevi of trunk: Secondary | ICD-10-CM | POA: Diagnosis not present

## 2018-03-05 DIAGNOSIS — L739 Follicular disorder, unspecified: Secondary | ICD-10-CM | POA: Diagnosis not present

## 2018-03-05 DIAGNOSIS — L812 Freckles: Secondary | ICD-10-CM | POA: Diagnosis not present

## 2018-03-05 DIAGNOSIS — D2270 Melanocytic nevi of unspecified lower limb, including hip: Secondary | ICD-10-CM | POA: Diagnosis not present

## 2018-03-05 DIAGNOSIS — L708 Other acne: Secondary | ICD-10-CM | POA: Diagnosis not present

## 2018-03-05 DIAGNOSIS — D1800 Hemangioma unspecified site: Secondary | ICD-10-CM | POA: Diagnosis not present

## 2018-03-05 DIAGNOSIS — Z1283 Encounter for screening for malignant neoplasm of skin: Secondary | ICD-10-CM | POA: Diagnosis not present

## 2018-03-05 DIAGNOSIS — L820 Inflamed seborrheic keratosis: Secondary | ICD-10-CM | POA: Diagnosis not present

## 2018-03-05 DIAGNOSIS — L578 Other skin changes due to chronic exposure to nonionizing radiation: Secondary | ICD-10-CM | POA: Diagnosis not present

## 2018-03-05 DIAGNOSIS — L821 Other seborrheic keratosis: Secondary | ICD-10-CM | POA: Diagnosis not present

## 2018-03-05 DIAGNOSIS — D2260 Melanocytic nevi of unspecified upper limb, including shoulder: Secondary | ICD-10-CM | POA: Diagnosis not present

## 2018-03-07 ENCOUNTER — Encounter: Payer: Self-pay | Admitting: Podiatry

## 2018-03-07 ENCOUNTER — Ambulatory Visit (INDEPENDENT_AMBULATORY_CARE_PROVIDER_SITE_OTHER): Payer: Medicare Other | Admitting: Podiatry

## 2018-03-07 DIAGNOSIS — E1142 Type 2 diabetes mellitus with diabetic polyneuropathy: Secondary | ICD-10-CM

## 2018-03-07 DIAGNOSIS — B351 Tinea unguium: Secondary | ICD-10-CM | POA: Diagnosis not present

## 2018-03-07 DIAGNOSIS — M79674 Pain in right toe(s): Secondary | ICD-10-CM | POA: Diagnosis not present

## 2018-03-07 DIAGNOSIS — M79675 Pain in left toe(s): Secondary | ICD-10-CM | POA: Diagnosis not present

## 2018-03-07 NOTE — Progress Notes (Signed)
Complaint:  Visit Type: Patient returns to my office for continued preventative foot care services. Complaint: Patient states" my nails have grown long and thick and become painful to walk and wear shoes" Patient has been diagnosed with DM with no foot complications. The patient presents for preventative foot care services. No changes to ROS  Podiatric Exam: Vascular: dorsalis pedis and posterior tibial pulses are palpable bilateral. Capillary return is immediate. Temperature gradient is WNL. Skin turgor WNL  Sensorium: Normal Semmes Weinstein monofilament test. Normal tactile sensation bilaterally. Nail Exam: Pt has thick disfigured discolored nails with subungual debris noted bilateral hallux. Ulcer Exam: There is no evidence of ulcer or pre-ulcerative changes or infection. Orthopedic Exam: Muscle tone and strength are WNL. No limitations in general ROM. No crepitus or effusions noted. Foot type and digits show no abnormalities. Bony prominences are unremarkable. Skin: No Porokeratosis. No infection or ulcers  Diagnosis:  Onychomycosis, , Pain in right toe, pain in left toes  Treatment & Plan Procedures and Treatment: Consent by patient was obtained for treatment procedures.   Debridement of mycotic and hypertrophic toenails, 1 through 5 bilateral and clearing of subungual debris. No ulceration, no infection noted.  Return Visit-Office Procedure: Patient instructed to return to the office for a follow up visit 3 months for continued evaluation and treatment.    Gardiner Barefoot DPM

## 2018-03-17 ENCOUNTER — Other Ambulatory Visit: Payer: Self-pay | Admitting: Family Medicine

## 2018-03-18 ENCOUNTER — Other Ambulatory Visit: Payer: Self-pay | Admitting: Family Medicine

## 2018-03-18 DIAGNOSIS — E7849 Other hyperlipidemia: Secondary | ICD-10-CM

## 2018-03-18 NOTE — Telephone Encounter (Signed)
[facility] faxed refill request for the following medications:  rosuvastatin (CRESTOR) 10 MG tablet   Please advise. 

## 2018-03-19 ENCOUNTER — Ambulatory Visit (INDEPENDENT_AMBULATORY_CARE_PROVIDER_SITE_OTHER): Payer: Medicare Other | Admitting: Family Medicine

## 2018-03-19 ENCOUNTER — Encounter: Payer: Self-pay | Admitting: Family Medicine

## 2018-03-19 ENCOUNTER — Other Ambulatory Visit: Payer: Self-pay

## 2018-03-19 VITALS — BP 138/64 | HR 81 | Temp 97.4°F | Ht 60.0 in | Wt 146.8 lb

## 2018-03-19 DIAGNOSIS — J069 Acute upper respiratory infection, unspecified: Secondary | ICD-10-CM | POA: Diagnosis not present

## 2018-03-19 DIAGNOSIS — E7849 Other hyperlipidemia: Secondary | ICD-10-CM | POA: Diagnosis not present

## 2018-03-19 DIAGNOSIS — I10 Essential (primary) hypertension: Secondary | ICD-10-CM

## 2018-03-19 DIAGNOSIS — E119 Type 2 diabetes mellitus without complications: Secondary | ICD-10-CM | POA: Diagnosis not present

## 2018-03-19 DIAGNOSIS — F320 Major depressive disorder, single episode, mild: Secondary | ICD-10-CM

## 2018-03-19 LAB — POCT GLYCOSYLATED HEMOGLOBIN (HGB A1C): Hemoglobin A1C: 7.3 % — AB (ref 4.0–5.6)

## 2018-03-19 MED ORDER — ROSUVASTATIN CALCIUM 10 MG PO TABS: 10.0000 mg | ORAL_TABLET | Freq: Every day | ORAL | 3 refills | Status: DC

## 2018-03-19 NOTE — Progress Notes (Signed)
Patient: Nicole Young Female    DOB: 1940-03-09   78 y.o.   MRN: [id number] Visit Date: 03/19/2018  Today's Provider: [name], MD   Chief Complaint  Patient presents with  . Diabetes    4 month fup   Subjective:     HPI   Diabetes Mellitus Type II, Follow-up:   Lab Results  Component Value Date   HGBA1C 7.0 (A) 11/20/2017   HGBA1C 6.7 (A) 07/17/2017   HGBA1C 6.7 02/20/2017    Last seen for diabetes 4 months ago.  Management since then includes no changes She reports good compliance with treatment. She is not having side effects.  Current symptoms include none and have been unchanged. Home blood sugar records: fasting range: 115-130  Episodes of hypoglycemia? no   Current Insulin Regimen: none Most Recent Eye Exam: appt in 2 weeks 04/11/18 Weight trend: decreasing steadily Prior visit with dietician: 14 yrs ago Current exercise: walking Current diet habits: in general, a "healthy" diet     Pertinent Labs:    Component Value Date/Time   CHOL 142 07/17/2017 0908   TRIG 131 07/17/2017 0908   HDL 57 07/17/2017 0908   LDLCALC 59 07/17/2017 0908   CREATININE 0.75 07/17/2017 0908    Wt Readings from Last 3 Encounters:  03/19/18 146 lb 12.8 oz (66.6 kg)  02/08/18 146 lb 6.4 oz (66.4 kg)  11/20/17 146 lb (66.2 kg)   Patient has had usual recent URI with mild cough, is on congestion.  This is getting better. ------------------------------------------------------------------------   Allergies  Allergen Reactions  . Neomycin-Polymyxin-Gramicidin Other (See Comments)    Other Reaction: Other reaction  . Wilder Glade [Dapagliflozin] Other (See Comments)    Weak and dizzy   . Neosporin [Neomycin-Bacitracin Zn-Polymyx] Swelling  . Thimerosal Swelling  . Zostavax  [Zoster Vaccine Live] Rash    Do Not Administer Zostavax to Pt!     Current Outpatient Medications:  .  ACCU-CHEK FASTCLIX LANCETS MISC, Check sugar once daily. DX: E11.9, Disp: 102  each, Rfl: 12 .  cholecalciferol (VITAMIN D3) 25 MCG (1000 UT) tablet, Take by mouth daily. Unsure of dose, Disp: , Rfl:  .  diazepam (VALIUM) 5 MG tablet, TAKE ONE TABLET BY MOUTH NIGHTLY AT BEDTIME AS NEEDED, Disp: 30 tablet, Rfl: 1 .  glucose blood (ACCU-CHEK SMARTVIEW) test strip, Check sugar three times daily. DX E11.9, Disp: 100 each, Rfl: 12 .  levothyroxine (SYNTHROID, LEVOTHROID) 75 MCG tablet, TAKE 1 TABLET DAILY, Disp: 90 tablet, Rfl: 3 .  Magnesium 250 MG TABS, Take 1 tablet by mouth daily., Disp: , Rfl:  .  metFORMIN (GLUCOPHAGE-XR) 500 MG 24 hr tablet, Take 1 tablet (500 mg total) by mouth 2 (two) times daily. (Patient taking differently: Take 500 mg by mouth See admin instructions. Patient takes 1000 with her dinner meal), Disp: 180 tablet, Rfl: 3 .  Multiple Vitamin (MULTIVITAMIN) capsule, Take 1 capsule by mouth daily., Disp: , Rfl:  .  NON FORMULARY, Drinks Turmeric tea 2-3 times a day., Disp: , Rfl:  .  pantoprazole (PROTONIX) 20 MG tablet, TAKE 1 TABLET DAILY, Disp: 90 tablet, Rfl: 3 .  rosuvastatin (CRESTOR) 10 MG tablet, Take 1 tablet (10 mg total) by mouth at bedtime., Disp: 90 tablet, Rfl: 3 .  sertraline (ZOLOFT) 100 MG tablet, TAKE 1 TABLET DAILY, Disp: 90 tablet, Rfl: 4 .  azelastine (ASTELIN) 0.1 % nasal spray, Place 2 sprays into both nostrils. Reported on 06/21/2015, Disp: ,  Rfl:  .  AZO-CRANBERRY PO, Take 2 tablets by mouth daily., Disp: , Rfl:  .  Calcium-Vitamin D-Vitamin K (CALCIUM SOFT CHEWS PO), Take by mouth., Disp: , Rfl:  .  Calcium-Vitamin D-Vitamin K 263-785-88 MG-UNT-MCG CHEW, Chew by mouth., Disp: , Rfl:  .  Phenazopyridine HCl (AZO STANDARD MAXIMUM STRENGTH PO), Take 1 tablet by mouth daily., Disp: , Rfl:   Review of Systems  Constitutional: Negative.  Negative for fever.  HENT: Negative.   Eyes: Negative.   Respiratory: Positive for cough.   Cardiovascular: Negative.   Gastrointestinal: Negative.   Endocrine: Negative.   Genitourinary: Positive  for menstrual problem.  Musculoskeletal: Negative.   Skin: Negative.   Allergic/Immunologic: Negative.   Neurological: Negative.   Hematological: Negative.   Psychiatric/Behavioral: Negative.     Social History   Tobacco Use  . Smoking status: Never Smoker  . Smokeless tobacco: Never Used  Substance Use Topics  . Alcohol use: Yes    Alcohol/week: 2.0 - 3.0 standard drinks    Types: 2 - 3 Glasses of wine per week      Objective:   BP 138/64 (BP Location: Right Arm, Patient Position: Sitting, Cuff Size: Normal)   Pulse 81   Temp (!) 97.4 F (36.3 C) (Oral)   Ht 5' (1.524 m)   Wt 146 lb 12.8 oz (66.6 kg)   SpO2 94%   BMI 28.67 kg/m  Vitals:   03/19/18 1146  BP: 138/64  Pulse: 81  Temp: (!) 97.4 F (36.3 C)  TempSrc: Oral  SpO2: 94%  Weight: 146 lb 12.8 oz (66.6 kg)  Height: 5' (1.524 m)     Physical Exam Constitutional:      Appearance: She is well-developed.  HENT:     Head: Normocephalic and atraumatic.     Right Ear: External ear normal.     Nose: Nose normal.  Eyes:     Conjunctiva/sclera: Conjunctivae normal.  Neck:     Musculoskeletal: Normal range of motion.  Cardiovascular:     Rate and Rhythm: Normal rate and regular rhythm.     Heart sounds: Normal heart sounds.  Pulmonary:     Effort: Pulmonary effort is normal. No respiratory distress.     Breath sounds: Normal breath sounds.  Abdominal:     Palpations: Abdomen is soft.  Skin:    General: Skin is warm and dry.  Neurological:     Mental Status: She is alert and oriented to person, place, and time.  Psychiatric:        Behavior: Behavior normal.        Thought Content: Thought content normal.        Judgment: Judgment normal.         Assessment & Plan    1. Type 2 diabetes mellitus without complication, without long-term current use of insulin (HCC) A1c of 7.3 today. - POCT glycosylated hemoglobin (Hb A1C)  2. Essential (primary) hypertension Controlled.  3. Other  hyperlipidemia On Crestor.  4. 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  5. Viral upper respiratory tract infection Treat symptomatically.  Fluids rest Tylenol and Robitussin.     Richard Cranford Mon, MD  [facility]

## 2018-03-19 NOTE — Telephone Encounter (Signed)
Please review. Thanks!  

## 2018-04-03 ENCOUNTER — Other Ambulatory Visit: Payer: Medicare Other

## 2018-05-16 ENCOUNTER — Ambulatory Visit: Payer: Medicare Other | Admitting: Podiatry

## 2018-06-06 ENCOUNTER — Ambulatory Visit: Payer: Medicare Other | Admitting: Podiatry

## 2018-07-16 ENCOUNTER — Other Ambulatory Visit: Payer: Self-pay | Admitting: Family Medicine

## 2018-07-16 DIAGNOSIS — E119 Type 2 diabetes mellitus without complications: Secondary | ICD-10-CM

## 2018-07-22 ENCOUNTER — Other Ambulatory Visit: Payer: Self-pay | Admitting: Family Medicine

## 2018-07-23 ENCOUNTER — Ambulatory Visit (INDEPENDENT_AMBULATORY_CARE_PROVIDER_SITE_OTHER): Payer: Medicare Other | Admitting: Family Medicine

## 2018-07-23 ENCOUNTER — Other Ambulatory Visit: Payer: Self-pay

## 2018-07-23 ENCOUNTER — Encounter: Payer: Self-pay | Admitting: Family Medicine

## 2018-07-23 VITALS — BP 126/74 | HR 80 | Temp 97.5°F | Resp 16 | Wt 146.0 lb

## 2018-07-23 DIAGNOSIS — I10 Essential (primary) hypertension: Secondary | ICD-10-CM

## 2018-07-23 DIAGNOSIS — E119 Type 2 diabetes mellitus without complications: Secondary | ICD-10-CM

## 2018-07-23 DIAGNOSIS — E782 Mixed hyperlipidemia: Secondary | ICD-10-CM | POA: Diagnosis not present

## 2018-07-23 DIAGNOSIS — G2581 Restless legs syndrome: Secondary | ICD-10-CM | POA: Diagnosis not present

## 2018-07-23 MED ORDER — ACCU-CHEK SMARTVIEW VI STRP: 1.0000 | ORAL_STRIP | 12 refills | Status: DC | PRN

## 2018-07-23 MED ORDER — METFORMIN HCL ER 500 MG PO TB24: 500.0000 mg | ORAL_TABLET | Freq: Two times a day (BID) | ORAL | 3 refills | Status: DC

## 2018-07-23 MED ORDER — DIAZEPAM 5 MG PO TABS: ORAL_TABLET | ORAL | 1 refills | Status: DC

## 2018-07-23 MED ORDER — ACCU-CHEK FASTCLIX LANCETS MISC: 12 refills | Status: DC

## 2018-07-23 NOTE — Progress Notes (Signed)
Patient: Nicole Young Female    DOB: May 30, 1940   78 y.o.   MRN: [id number] Visit Date: 07/23/2018  Today's Provider: [name], MD   Chief Complaint  Patient presents with  . Diabetes  . Hypertension  . Hyperlipidemia   Subjective:    HPI  Diabetes Mellitus Type II, Follow-up:   Lab Results  Component Value Date   HGBA1C 7.3 (A) 03/19/2018   HGBA1C 7.0 (A) 11/20/2017   HGBA1C 6.7 (A) 07/17/2017    Last seen for diabetes 4 months ago.  Management since then includes no changes. She reports good compliance with treatment. She is not having side effects.  Current symptoms include none and have been stable. Home blood sugar records: fasting range: 130s  Episodes of hypoglycemia? yes - occasionally Current insulin regiment: Is not on insulin Most Recent Eye Exam: up to date Weight trend: stable Prior visit with dietician: No Current exercise: yard work Current diet habits: well balanced  Pertinent Labs:    Component Value Date/Time   CHOL 142 07/17/2017 0908   TRIG 131 07/17/2017 0908   HDL 57 07/17/2017 0908   LDLCALC 59 07/17/2017 0908   CREATININE 0.75 07/17/2017 0908    Wt Readings from Last 3 Encounters:  07/23/18 146 lb (66.2 kg)  03/19/18 146 lb 12.8 oz (66.6 kg)  02/08/18 146 lb 6.4 oz (66.4 kg)    Hypertension, follow-up:  BP Readings from Last 3 Encounters:  07/23/18 126/74  03/19/18 138/64  02/08/18 (!) 132/56    She was last seen for hypertension 4 months ago.  BP at that visit was 138/64. Management since that visit includes no changes. She reports good compliance with treatment. She is not having side effects.  She is not exercising. She is adherent to low salt diet.   Outside blood pressures are checked occasionally. She is experiencing none.  Patient denies exertional chest pressure/discomfort, lower extremity edema and palpitations.     Lipid/Cholesterol, Follow-up:   Last seen for this4 months ago.   Management changes since that visit include no changes. . Last Lipid Panel:    Component Value Date/Time   CHOL 142 07/17/2017 0908   TRIG 131 07/17/2017 0908   HDL 57 07/17/2017 0908   LDLCALC 59 07/17/2017 0908    Risk factors for vascular disease include diabetes mellitus and hypertension  She reports good compliance with treatment. She is not having side effects.  Current symptoms include none and have been stable.   Allergies  Allergen Reactions  . Neomycin-Polymyxin-Gramicidin Other (See Comments)    Other Reaction: Other reaction  . Wilder Glade [Dapagliflozin] Other (See Comments)    Weak and dizzy   . Neosporin [Neomycin-Bacitracin Zn-Polymyx] Swelling  . Thimerosal Swelling  . Zostavax  [Zoster Vaccine Live] Rash    Do Not Administer Zostavax to Pt!     Current Outpatient Medications:  .  ACCU-CHEK FASTCLIX LANCETS MISC, Check sugar once daily. DX: E11.9, Disp: 102 each, Rfl: 12 .  ACCU-CHEK SMARTVIEW test strip, USE TO CHECK BLOOD SUGAR ONCE A DAY, Disp: 100 strip, Rfl: 12 .  azelastine (ASTELIN) 0.1 % nasal spray, Place 2 sprays into both nostrils. Reported on 06/21/2015, Disp: , Rfl:  .  AZO-CRANBERRY PO, Take 2 tablets by mouth daily., Disp: , Rfl:  .  Calcium-Vitamin D-Vitamin K (CALCIUM SOFT CHEWS PO), Take by mouth., Disp: , Rfl:  .  Calcium-Vitamin D-Vitamin K 665-993-57 MG-UNT-MCG CHEW, Chew by mouth., Disp: , Rfl:  .  cholecalciferol (VITAMIN D3) 25 MCG (1000 UT) tablet, Take by mouth daily. Unsure of dose, Disp: , Rfl:  .  diazepam (VALIUM) 5 MG tablet, TAKE ONE TABLET BY MOUTH NIGHTLY AT BEDTIME AS NEEDED, Disp: 30 tablet, Rfl: 1 .  levothyroxine (SYNTHROID, LEVOTHROID) 75 MCG tablet, TAKE 1 TABLET DAILY, Disp: 90 tablet, Rfl: 3 .  Magnesium 250 MG TABS, Take 1 tablet by mouth daily., Disp: , Rfl:  .  metFORMIN (GLUCOPHAGE-XR) 500 MG 24 hr tablet, Take 1 tablet (500 mg total) by mouth 2 (two) times daily. (Patient taking differently: Take 500 mg by mouth See  admin instructions. Patient takes 1000 with her dinner meal), Disp: 180 tablet, Rfl: 3 .  Multiple Vitamin (MULTIVITAMIN) capsule, Take 1 capsule by mouth daily., Disp: , Rfl:  .  NON FORMULARY, Drinks Turmeric tea 2-3 times a day., Disp: , Rfl:  .  pantoprazole (PROTONIX) 20 MG tablet, TAKE 1 TABLET DAILY, Disp: 90 tablet, Rfl: 3 .  Phenazopyridine HCl (AZO STANDARD MAXIMUM STRENGTH PO), Take 1 tablet by mouth daily., Disp: , Rfl:  .  rosuvastatin (CRESTOR) 10 MG tablet, Take 1 tablet (10 mg total) by mouth at bedtime., Disp: 90 tablet, Rfl: 3 .  sertraline (ZOLOFT) 100 MG tablet, TAKE 1 TABLET DAILY, Disp: 90 tablet, Rfl: 4  Review of Systems  Constitutional: Negative.  Negative for activity change and fatigue.  HENT: Negative.   Eyes: Negative.   Respiratory: Negative for cough and shortness of breath.   Cardiovascular: Negative.  Negative for chest pain, palpitations and leg swelling.  Gastrointestinal: Negative.   Endocrine: Negative.   Musculoskeletal: Negative.  Negative for arthralgias, gait problem and joint swelling.  Neurological: Negative.  Negative for dizziness and light-headedness.  Hematological: Negative.   Psychiatric/Behavioral: Negative.  Negative for agitation, self-injury, sleep disturbance and suicidal ideas. The patient is not nervous/anxious.     Social History   Tobacco Use  . Smoking status: Never Smoker  . Smokeless tobacco: Never Used  Substance Use Topics  . Alcohol use: Yes    Alcohol/week: 2.0 - 3.0 standard drinks    Types: 2 - 3 Glasses of wine per week      Objective:   BP 126/74 (BP Location: Left Arm, Patient Position: Sitting, Cuff Size: Normal)   Pulse 80   Temp (!) 97.5 F (36.4 C)   Resp 16   Wt 146 lb (66.2 kg)   SpO2 98%   BMI 28.51 kg/m  Vitals:   07/23/18 1127  BP: 126/74  Pulse: 80  Resp: 16  Temp: (!) 97.5 F (36.4 C)  SpO2: 98%  Weight: 146 lb (66.2 kg)     Physical Exam Vitals signs reviewed.  Constitutional:       Appearance: She is well-developed.  HENT:     Head: Normocephalic and atraumatic.     Right Ear: External ear normal.     Left Ear: External ear normal.     Nose: Nose normal.  Eyes:     General: No scleral icterus.    Conjunctiva/sclera: Conjunctivae normal.  Neck:     Thyroid: No thyromegaly.  Cardiovascular:     Rate and Rhythm: Normal rate and regular rhythm.     Heart sounds: Normal heart sounds.  Pulmonary:     Effort: Pulmonary effort is normal.     Breath sounds: Normal breath sounds.  Abdominal:     Palpations: Abdomen is soft.  Skin:    General: Skin is warm and dry.  Neurological:  General: No focal deficit present.     Mental Status: She is alert and oriented to person, place, and time.  Psychiatric:        Mood and Affect: Mood normal.        Behavior: Behavior normal.        Thought Content: Thought content normal.        Judgment: Judgment normal.      No results found for any visits on 07/23/18.     Assessment & Plan    1. Type 2 diabetes mellitus without complication, without long-term current use of insulin (HCC) Metformin 500 q am and 1,000 q pm. - Hemoglobin A1c - glucose blood (ACCU-CHEK SMARTVIEW) test strip; 1 each by Other route as needed for other. Use as instructed  Dispense: 100 strip; Refill: 12  2. Essential hypertension  - CBC with Differential/Platelet  3. Mixed hyperlipidemia On crestor. - Comprehensive metabolic panel - Lipid panel - TSH  4. Restless leg Pt advised to limit Diazepam. - diazepam (VALIUM) 5 MG tablet; TAKE ONE TABLET BY MOUTH NIGHTLY AT BEDTIME AS NEEDED  Dispense: 30 tablet; Refill: [place], MD  [facility]

## 2018-07-24 DIAGNOSIS — E782 Mixed hyperlipidemia: Secondary | ICD-10-CM | POA: Diagnosis not present

## 2018-07-24 DIAGNOSIS — E119 Type 2 diabetes mellitus without complications: Secondary | ICD-10-CM | POA: Diagnosis not present

## 2018-07-24 DIAGNOSIS — I10 Essential (primary) hypertension: Secondary | ICD-10-CM | POA: Diagnosis not present

## 2018-07-25 ENCOUNTER — Telehealth: Payer: Self-pay

## 2018-07-25 ENCOUNTER — Other Ambulatory Visit: Payer: Self-pay

## 2018-07-25 LAB — LIPID PANEL
Chol/HDL Ratio: 2.6 ratio (ref 0.0–4.4)
Cholesterol, Total: 133 mg/dL (ref 100–199)
HDL: 51 mg/dL (ref >39)
LDL Calculated: 55 mg/dL (ref 0–99)
Triglycerides: 134 mg/dL (ref 0–149)
VLDL Cholesterol Cal: 27 mg/dL (ref 5–40)

## 2018-07-25 LAB — COMPREHENSIVE METABOLIC PANEL
ALT: 15 IU/L (ref 0–32)
AST: 17 IU/L (ref 0–40)
Albumin/Globulin Ratio: 2.2 (ref 1.2–2.2)
Albumin: 4.4 g/dL (ref 3.7–4.7)
Alkaline Phosphatase: 43 IU/L (ref 39–117)
BUN/Creatinine Ratio: 20 (ref 12–28)
BUN: 13 mg/dL (ref 8–27)
Bilirubin Total: 0.2 mg/dL (ref 0.0–1.2)
CO2: 21 mmol/L (ref 20–29)
Calcium: 9.3 mg/dL (ref 8.7–10.3)
Chloride: 100 mmol/L (ref 96–106)
Creatinine, Ser: 0.65 mg/dL (ref 0.57–1.00)
GFR calc Af Amer: 98 mL/min/{1.73_m2} (ref >59)
GFR calc non Af Amer: 85 mL/min/{1.73_m2} (ref >59)
Globulin, Total: 2 g/dL (ref 1.5–4.5)
Glucose: 134 mg/dL — ABNORMAL HIGH (ref 65–99)
Potassium: 4.3 mmol/L (ref 3.5–5.2)
Sodium: 137 mmol/L (ref 134–144)
Total Protein: 6.4 g/dL (ref 6.0–8.5)

## 2018-07-25 LAB — HEMOGLOBIN A1C
Est. average glucose Bld gHb Est-mCnc: 143 mg/dL
Hgb A1c MFr Bld: 6.6 % — ABNORMAL HIGH (ref 4.8–5.6)

## 2018-07-25 LAB — CBC WITH DIFFERENTIAL/PLATELET
Basophils Absolute: 0.1 10*3/uL (ref 0.0–0.2)
Basos: 1 %
EOS (ABSOLUTE): 0.1 10*3/uL (ref 0.0–0.4)
Eos: 2 %
Hematocrit: 39.4 % (ref 34.0–46.6)
Hemoglobin: 13.2 g/dL (ref 11.1–15.9)
Immature Grans (Abs): 0 10*3/uL (ref 0.0–0.1)
Immature Granulocytes: 0 %
Lymphocytes Absolute: 2.1 10*3/uL (ref 0.7–3.1)
Lymphs: 29 %
MCH: 29.5 pg (ref 26.6–33.0)
MCHC: 33.5 g/dL (ref 31.5–35.7)
MCV: 88 fL (ref 79–97)
Monocytes Absolute: 0.6 10*3/uL (ref 0.1–0.9)
Monocytes: 9 %
Neutrophils Absolute: 4.2 10*3/uL (ref 1.4–7.0)
Neutrophils: 59 %
Platelets: 226 10*3/uL (ref 150–450)
RBC: 4.48 x10E6/uL (ref 3.77–5.28)
RDW: 13.6 % (ref 11.7–15.4)
WBC: 7.1 10*3/uL (ref 3.4–10.8)

## 2018-07-25 LAB — TSH: TSH: 0.819 u[IU]/mL (ref 0.450–4.500)

## 2018-07-25 NOTE — Telephone Encounter (Signed)
-----   Message from Jerrol Banana., MD sent at 07/25/2018  1:11 PM EDT ----- Labs good.

## 2018-07-25 NOTE — Telephone Encounter (Signed)
Patient advised.KW 

## 2018-07-25 NOTE — Telephone Encounter (Signed)
Patient is requesting a refill on Metformin.

## 2018-07-26 NOTE — Telephone Encounter (Signed)
Encounter error

## 2018-07-29 MED ORDER — METFORMIN HCL ER 500 MG PO TB24: 500.0000 mg | ORAL_TABLET | Freq: Two times a day (BID) | ORAL | 3 refills | Status: DC

## 2018-08-06 ENCOUNTER — Telehealth: Payer: Self-pay | Admitting: Family Medicine

## 2018-08-06 NOTE — Telephone Encounter (Signed)
Spoke to patient regarding her symptoms. She is experiencing lower right abdominal pain that is causing her to have difficulty walking. Pain started yesterday. She is not having any other symptoms.  Appointment scheduled as requested.

## 2018-08-06 NOTE — Telephone Encounter (Signed)
Pt not having any other symptoms except for tenderness and occasional sharp abdominal, right side of navel pain.  Pt wanting to come in to have it looked at.  Please advise.  Thanks, [employer]

## 2018-08-07 ENCOUNTER — Encounter: Payer: Self-pay | Admitting: Family Medicine

## 2018-08-07 ENCOUNTER — Other Ambulatory Visit: Payer: Self-pay

## 2018-08-07 ENCOUNTER — Ambulatory Visit (INDEPENDENT_AMBULATORY_CARE_PROVIDER_SITE_OTHER): Payer: Medicare Other | Admitting: Family Medicine

## 2018-08-07 VITALS — BP 124/70 | HR 88 | Temp 97.6°F | Resp 18 | Wt 141.0 lb

## 2018-08-07 DIAGNOSIS — E7849 Other hyperlipidemia: Secondary | ICD-10-CM

## 2018-08-07 DIAGNOSIS — R109 Unspecified abdominal pain: Secondary | ICD-10-CM

## 2018-08-07 DIAGNOSIS — E119 Type 2 diabetes mellitus without complications: Secondary | ICD-10-CM | POA: Diagnosis not present

## 2018-08-07 NOTE — Progress Notes (Signed)
Patient: Nicole Young Female    DOB: 09/05/1940   78 y.o.   MRN: [id number] Visit Date: 08/07/2018  Today's Provider: [name], MD   Chief Complaint  Patient presents with  . Abdominal Pain   Subjective:     HPI  Lower right abdominal pain that started Monday night. She experienced some nausea after the first initial pain.   Allergies  Allergen Reactions  . Neomycin-Polymyxin-Gramicidin Other (See Comments)    Other Reaction: Other reaction  . Wilder Glade [Dapagliflozin] Other (See Comments)    Weak and dizzy   . Neosporin [Neomycin-Bacitracin Zn-Polymyx] Swelling  . Thimerosal Swelling  . Zostavax  [Zoster Vaccine Live] Rash    Do Not Administer Zostavax to Pt!     Current Outpatient Medications:  .  Accu-Chek FastClix Lancets MISC, Check sugar once daily. DX: E11.9, Disp: 102 each, Rfl: 12 .  diazepam (VALIUM) 5 MG tablet, TAKE ONE TABLET BY MOUTH NIGHTLY AT BEDTIME AS NEEDED, Disp: 30 tablet, Rfl: 1 .  glucose blood (ACCU-CHEK SMARTVIEW) test strip, 1 each by Other route as needed for other. Use as instructed, Disp: 100 strip, Rfl: 12 .  levothyroxine (SYNTHROID, LEVOTHROID) 75 MCG tablet, TAKE 1 TABLET DAILY, Disp: 90 tablet, Rfl: 3 .  Magnesium 250 MG TABS, Take 1 tablet by mouth daily., Disp: , Rfl:  .  metFORMIN (GLUCOPHAGE-XR) 500 MG 24 hr tablet, Take 1 tablet (500 mg total) by mouth 2 (two) times daily. 1 po q am and 2 po q pm, Disp: 180 tablet, Rfl: 3 .  Multiple Vitamin (MULTIVITAMIN) capsule, Take 1 capsule by mouth daily., Disp: , Rfl:  .  pantoprazole (PROTONIX) 20 MG tablet, TAKE 1 TABLET DAILY, Disp: 90 tablet, Rfl: 3 .  rosuvastatin (CRESTOR) 10 MG tablet, Take 1 tablet (10 mg total) by mouth at bedtime., Disp: 90 tablet, Rfl: 3 .  sertraline (ZOLOFT) 100 MG tablet, TAKE 1 TABLET DAILY, Disp: 90 tablet, Rfl: 4 .  azelastine (ASTELIN) 0.1 % nasal spray, Place 2 sprays into both nostrils. Reported on 06/21/2015, Disp: , Rfl:  .  AZO-CRANBERRY  PO, Take 2 tablets by mouth daily., Disp: , Rfl:  .  Calcium-Vitamin D-Vitamin K (CALCIUM SOFT CHEWS PO), Take by mouth., Disp: , Rfl:  .  Calcium-Vitamin D-Vitamin K 500-100-40 MG-UNT-MCG CHEW, Chew by mouth., Disp: , Rfl:  .  cholecalciferol (VITAMIN D3) 25 MCG (1000 UT) tablet, Take by mouth daily. Unsure of dose, Disp: , Rfl:  .  NON FORMULARY, Drinks Turmeric tea 2-3 times a day., Disp: , Rfl:  .  Phenazopyridine HCl (AZO STANDARD MAXIMUM STRENGTH PO), Take 1 tablet by mouth daily., Disp: , Rfl:   Review of Systems  Constitutional: Negative.   Eyes: Negative.   Respiratory: Negative.   Cardiovascular: Negative.   Gastrointestinal: Positive for abdominal pain, diarrhea and nausea.  Endocrine: Negative.   Skin: Negative.   Allergic/Immunologic: Negative.   Neurological: Negative.   Hematological: Negative.   Psychiatric/Behavioral: Negative.     Social History   Tobacco Use  . Smoking status: Never Smoker  . Smokeless tobacco: Never Used  Substance Use Topics  . Alcohol use: Yes    Alcohol/week: 2.0 - 3.0 standard drinks    Types: 2 - 3 Glasses of wine per week      Objective:   BP 124/70 (BP Location: Left Arm, Patient Position: Sitting, Cuff Size: Normal)   Pulse 88   Temp 97.6 F (36.4 C) (Oral)  Resp 18   Wt 141 lb (64 kg)   SpO2 97%   BMI 27.54 kg/m  Vitals:   08/07/18 1020  BP: 124/70  Pulse: 88  Resp: 18  Temp: 97.6 F (36.4 C)  TempSrc: Oral  SpO2: 97%  Weight: 141 lb (64 kg)     Physical Exam Vitals signs reviewed.  Constitutional:      Appearance: She is well-developed.  HENT:     Head: Normocephalic and atraumatic.     Right Ear: External ear normal.     Left Ear: External ear normal.     Nose: Nose normal.  Eyes:     General: No scleral icterus.    Conjunctiva/sclera: Conjunctivae normal.  Neck:     Thyroid: No thyromegaly.  Cardiovascular:     Rate and Rhythm: Normal rate and regular rhythm.     Heart sounds: Normal heart  sounds.  Pulmonary:     Effort: Pulmonary effort is normal.     Breath sounds: Normal breath sounds.  Abdominal:     Palpations: Abdomen is soft.     Tenderness: There is abdominal tenderness in the right upper quadrant. There is no guarding or rebound. Negative signs include Murphy's sign and McBurney's sign.  Skin:    General: Skin is warm and dry.  Neurological:     General: No focal deficit present.     Mental Status: She is alert and oriented to person, place, and time.  Psychiatric:        Mood and Affect: Mood normal.        Behavior: Behavior normal.        Thought Content: Thought content normal.        Judgment: Judgment normal.      No results found for any visits on 08/07/18.     Assessment & Plan    1. Abdominal pain, unspecified abdominal location Pain, minimal tenderness in right periumbilical area.Obtain abdominal US and RTC 1-2 weeks. - US Abdomen Complete; Future - CBC w/Diff/Platelet - Comp. Metabolic Panel (12) - Lipase  2. Type 2 diabetes mellitus without complication, without long-term current use of insulin ([place])   3. Other hyperlipidemia      [name], MD  [facility]

## 2018-08-08 ENCOUNTER — Telehealth: Payer: Self-pay | Admitting: Family Medicine

## 2018-08-08 LAB — COMP. METABOLIC PANEL (12)
AST: 19 IU/L (ref 0–40)
Albumin/Globulin Ratio: 2.4 — ABNORMAL HIGH (ref 1.2–2.2)
Albumin: 4.6 g/dL (ref 3.7–4.7)
Alkaline Phosphatase: 45 IU/L (ref 39–117)
BUN/Creatinine Ratio: 24 (ref 12–28)
BUN: 15 mg/dL (ref 8–27)
Bilirubin Total: 0.3 mg/dL (ref 0.0–1.2)
Calcium: 9.4 mg/dL (ref 8.7–10.3)
Chloride: 98 mmol/L (ref 96–106)
Creatinine, Ser: 0.63 mg/dL (ref 0.57–1.00)
GFR calc Af Amer: 99 mL/min/{1.73_m2} (ref >59)
GFR calc non Af Amer: 86 mL/min/{1.73_m2} (ref >59)
Globulin, Total: 1.9 g/dL (ref 1.5–4.5)
Glucose: 135 mg/dL — ABNORMAL HIGH (ref 65–99)
Potassium: 4.2 mmol/L (ref 3.5–5.2)
Sodium: 135 mmol/L (ref 134–144)
Total Protein: 6.5 g/dL (ref 6.0–8.5)

## 2018-08-08 LAB — CBC WITH DIFFERENTIAL/PLATELET
Basophils Absolute: 0.1 10*3/uL (ref 0.0–0.2)
Basos: 1 %
EOS (ABSOLUTE): 0.1 10*3/uL (ref 0.0–0.4)
Eos: 2 %
Hematocrit: 37.8 % (ref 34.0–46.6)
Hemoglobin: 12.7 g/dL (ref 11.1–15.9)
Immature Grans (Abs): 0 10*3/uL (ref 0.0–0.1)
Immature Granulocytes: 0 %
Lymphocytes Absolute: 2 10*3/uL (ref 0.7–3.1)
Lymphs: 24 %
MCH: 29.5 pg (ref 26.6–33.0)
MCHC: 33.6 g/dL (ref 31.5–35.7)
MCV: 88 fL (ref 79–97)
Monocytes Absolute: 0.6 10*3/uL (ref 0.1–0.9)
Monocytes: 8 %
Neutrophils Absolute: 5.3 10*3/uL (ref 1.4–7.0)
Neutrophils: 65 %
Platelets: 244 10*3/uL (ref 150–450)
RBC: 4.31 x10E6/uL (ref 3.77–5.28)
RDW: 13.6 % (ref 11.7–15.4)
WBC: 8.1 10*3/uL (ref 3.4–10.8)

## 2018-08-08 LAB — LIPASE: Lipase: 33 U/L (ref 14–85)

## 2018-08-08 NOTE — Telephone Encounter (Signed)
No

## 2018-08-08 NOTE — Telephone Encounter (Signed)
Please review. Thanks!  

## 2018-08-08 NOTE — Telephone Encounter (Signed)
Pt is supposed to have an Ultra Sound done.  Pt is wanting to change that to a CAT Scan since she's having such intense pain and wanting to get it figured out asap.  Please let  Pt know if this was changed and when she can have it done.  Thanks, [employer]

## 2018-08-12 ENCOUNTER — Telehealth: Payer: Self-pay

## 2018-08-12 NOTE — Telephone Encounter (Signed)
Patient notified of lab results

## 2018-08-12 NOTE — Telephone Encounter (Signed)
-----   Message from Jerrol Banana., MD sent at 08/08/2018  1:07 PM EDT ----- Labs good.

## 2018-08-15 ENCOUNTER — Telehealth: Payer: Self-pay

## 2018-08-15 ENCOUNTER — Other Ambulatory Visit: Payer: Self-pay

## 2018-08-15 ENCOUNTER — Other Ambulatory Visit: Payer: Self-pay | Admitting: Family Medicine

## 2018-08-15 ENCOUNTER — Ambulatory Visit
Admission: RE | Admit: 2018-08-15 | Discharge: 2018-08-15 | Disposition: A | Discharge status: Home / Self Care | Payer: Medicare Other | Source: Ambulatory Visit | Attending: Family Medicine | Admitting: Family Medicine

## 2018-08-15 DIAGNOSIS — D259 Leiomyoma of uterus, unspecified: Secondary | ICD-10-CM | POA: Diagnosis not present

## 2018-08-15 DIAGNOSIS — R109 Unspecified abdominal pain: Secondary | ICD-10-CM | POA: Diagnosis not present

## 2018-08-15 DIAGNOSIS — N83202 Unspecified ovarian cyst, left side: Secondary | ICD-10-CM | POA: Diagnosis not present

## 2018-08-15 NOTE — Telephone Encounter (Signed)
Patient notified of results.

## 2018-08-15 NOTE — Telephone Encounter (Signed)
-----   Message from Jerrol Banana., MD sent at 08/15/2018  1:31 PM EDT ----- [place] are normal except for probable fatty liver which fits with her history.

## 2018-08-16 ENCOUNTER — Ambulatory Visit (INDEPENDENT_AMBULATORY_CARE_PROVIDER_SITE_OTHER): Payer: Medicare Other | Admitting: Obstetrics & Gynecology

## 2018-08-16 ENCOUNTER — Encounter: Payer: Self-pay | Admitting: Obstetrics & Gynecology

## 2018-08-16 ENCOUNTER — Other Ambulatory Visit (HOSPITAL_COMMUNITY)
Admission: RE | Admit: 2018-08-16 | Discharge: 2018-08-16 | Disposition: A | Discharge status: Home / Self Care | Payer: Medicare Other | Source: Ambulatory Visit | Attending: Obstetrics & Gynecology | Admitting: Obstetrics & Gynecology

## 2018-08-16 VITALS — BP 120/80 | Ht 60.0 in | Wt 143.0 lb

## 2018-08-16 DIAGNOSIS — Z780 Asymptomatic menopausal state: Secondary | ICD-10-CM | POA: Insufficient documentation

## 2018-08-16 DIAGNOSIS — R87810 Cervical high risk human papillomavirus (HPV) DNA test positive: Secondary | ICD-10-CM | POA: Diagnosis not present

## 2018-08-16 DIAGNOSIS — Z124 Encounter for screening for malignant neoplasm of cervix: Secondary | ICD-10-CM

## 2018-08-16 DIAGNOSIS — N816 Rectocele: Secondary | ICD-10-CM | POA: Diagnosis not present

## 2018-08-16 DIAGNOSIS — Z1151 Encounter for screening for human papillomavirus (HPV): Secondary | ICD-10-CM | POA: Diagnosis not present

## 2018-08-16 DIAGNOSIS — R1031 Right lower quadrant pain: Secondary | ICD-10-CM

## 2018-08-16 DIAGNOSIS — N952 Postmenopausal atrophic vaginitis: Secondary | ICD-10-CM

## 2018-08-16 HISTORY — DX: Postmenopausal atrophic vaginitis: N95.2

## 2018-08-16 NOTE — Patient Instructions (Signed)
Replens for vaginal dryness (over the counter)   Pelvic Organ Prolapse Pelvic organ prolapse is the stretching, bulging, or dropping of pelvic organs into an abnormal position. It happens when the muscles and tissues that surround and support pelvic structures become weak or stretched. Pelvic organ prolapse can involve the:  Vagina (vaginal prolapse).  Uterus (uterine prolapse).  Bladder (cystocele).  Rectum (rectocele).  Intestines (enterocele). When organs other than the vagina are involved, they often bulge into the vagina or protrude from the vagina, depending on how severe the prolapse is. What are the causes? This condition may be caused by:  Pregnancy, labor, and childbirth.  Past pelvic surgery.  Decreased production of the hormone estrogen associated with menopause.  Consistently lifting more than 50 lb (23 kg).  Obesity.  Long-term inability to pass stool (chronic constipation).  A cough that lasts a long time (chronic).  Buildup of fluid in the abdomen due to certain diseases and other conditions. What are the signs or symptoms? Symptoms of this condition include:  Passing a little urine (loss of bladder control) when you cough, sneeze, strain, and exercise (stress incontinence). This may be worse immediately after childbirth. It may gradually improve over time.  Feeling pressure in your pelvis or vagina. This pressure may increase when you cough or when you are passing stool.  A bulge that protrudes from the opening of your vagina.  Difficulty passing urine or stool.  Pain in your lower back.  Pain, discomfort, or disinterest in sex.  Repeated bladder infections (urinary tract infections).  Difficulty inserting a tampon. In some people, this condition causes no symptoms. How is this diagnosed? This condition may be diagnosed based on a vaginal and rectal exam. During the exam, you may be asked to cough and strain while you are lying down, sitting, and  standing up. Your health care provider will determine if other tests are required, such as bladder function tests. How is this treated? Treatment for this condition may depend on your symptoms. Treatment may include:  Lifestyle changes, such as changes to your diet.  Emptying your bladder at scheduled times (bladder training therapy). This can help reduce or avoid urinary incontinence.  Estrogen. Estrogen may help mild prolapse by increasing the strength and tone of pelvic floor muscles.  Kegel exercises. These may help mild cases of prolapse by strengthening and tightening the muscles of the pelvic floor.  A soft, flexible device that helps support the vaginal walls and keep pelvic organs in place (pessary). This is inserted into your vagina by your health care provider.  Surgery. This is often the only form of treatment for severe prolapse. Follow these instructions at home:  Avoid drinking beverages that contain caffeine or alcohol.  Increase your intake of high-fiber foods. This can help decrease constipation and straining during bowel movements.  Lose weight if recommended by your health care provider.  Wear a sanitary pad or adult diapers if you have urinary incontinence.  Avoid heavy lifting and straining with exercise and work. Do not hold your breath when you perform mild to moderate lifting and exercise activities. Limit your activities as directed by your health care provider.  Do Kegel exercises as directed by your health care provider. To do this: ? Squeeze your pelvic floor muscles tight. You should feel a tight lift in your rectal area and a tightness in your vaginal area. Keep your stomach, buttocks, and legs relaxed. ? Hold the muscles tight for up to 10 seconds. ? Relax your  muscles. ? Repeat this exercise 50 times a day, or as many times as told by your health care provider. Continue to do this exercise for at least 4-6 weeks, or for as long as told by your health  care provider.  Take over-the-counter and prescription medicines only as told by your health care provider.  If you have a pessary, take care of it as told by your health care provider.  Keep all follow-up visits as told by your health care provider. This is important. Contact a health care provider if you:  Have symptoms that interfere with your daily activities or sex life.  Need medicine to help with the discomfort.  Notice bleeding from your vagina that is not related to your period.  Have a fever.  Have pain or bleeding when you urinate.  Have bleeding when you pass stool.  Pass urine when you have sex.  Have chronic constipation.  Have a pessary that falls out.  Have bad smelling vaginal discharge.  Have an unusual, low pain in your abdomen. Summary  Pelvic organ prolapse is the stretching, bulging, or dropping of pelvic organs into an abnormal position. It happens when the muscles and tissues that surround and support pelvic structures become weak or stretched.  When organs other than the vagina are involved, they often bulge into the vagina or protrude from the vagina, depending on how severe the prolapse is.  In most cases, this condition needs to be treated only if it produces symptoms. Treatment may include lifestyle changes, estrogen, Kegel exercises, pessary insertion, or surgery.  Avoid heavy lifting and straining with exercise and work. Do not hold your breath when you perform mild to moderate lifting and exercise activities. Limit your activities as directed by your health care provider. This information is not intended to replace advice given to you by your health care provider. Make sure you discuss any questions you have with your health care provider. Document Released: 07/23/2013 Document Revised: 01/17/2017 Document Reviewed: 01/17/2017 Elsevier Patient Education  2020 [employer].

## 2018-08-16 NOTE — Progress Notes (Signed)
Consultant: Dr Miguel Aschoff Reason: RLQ pain, Pelvic Organ Prolapse  Cystocele/Rectocele Patient complains of a cystocele and rectocele. Problem intensifieda few weeks ago. Symptoms include: prolapse of tissue with straining and impaired defecation: moderate. Symptoms have been intermittent.    Pt has urinary urgency abd nocturia (3 times nightly).  No LOU or incontinence.  She also reports vag irritation, mostly related to prolapse of rectocele she has noted off and on.  She has to help w BMs most times with a vaginal hand.  Not sexually active.  Also reports RLQ or more so right sided pain foir 1 week.  Had [place] of pelvis and abd (Normal).  Sharp shooting lasted for 18 hours and now is quite mild.  Normal BMs.    PMHx: She  has a past medical history of Complication of anesthesia, Depression, Diabetes mellitus without complication ([place]), Hypothyroidism, Neuropathy, Osteoarthritis, Rectocele, and Thyroid disease. Also,  has a past surgical history that includes Tonsilectomy, adenoidectomy, bilateral myringotomy and tubes; Parathyroidectomy (1992); Placement of breast implants (1975); Colonoscopy (2005); Colonoscopy (N/A, 06/24/2014); Carpal tunnel release (Right, 08/26/2014); Breast surgery; Augmentation mammaplasty (1975); and Augmentation mammaplasty (2017)., family history includes Breast cancer in her maternal aunt; CVA in her paternal grandmother; Cancer in her father; Congestive Heart Failure in her maternal grandmother; Diabetes in her paternal uncle; Heart attack in her maternal grandfather and mother; Hypertension in her mother; Prostate cancer in her father and paternal grandfather.,  reports that she has never smoked. She has never used smokeless tobacco. She reports current alcohol use of about 2.0 - 3.0 standard drinks of alcohol per week. She reports that she does not use drugs.  She has a current medication list which includes the following prescription(s): accu-chek fastclix lancets,  azelastine, cranberry, calcium-vitamin d-vitamin k, calcium-vitamin d-vitamin k, cholecalciferol, diazepam, accu-chek smartview, levothyroxine, magnesium, metformin, multivitamin, NON FORMULARY, pantoprazole, phenazopyridine hcl, rosuvastatin, and sertraline. Also, is allergic to neomycin-polymyxin-gramicidin; farxiga [dapagliflozin]; neosporin [neomycin-bacitracin zn-polymyx]; thimerosal; and zostavax  [zoster vaccine live].  Review of Systems  Constitutional: Negative for chills, fever and malaise/fatigue.  HENT: Negative for congestion, sinus pain and sore throat.   Eyes: Negative for blurred vision and pain.  Respiratory: Negative for cough and wheezing.   Cardiovascular: Negative for chest pain and leg swelling.  Gastrointestinal: Negative for abdominal pain, constipation, diarrhea, heartburn, nausea and vomiting.  Genitourinary: Negative for dysuria, frequency, hematuria and urgency.  Musculoskeletal: Negative for back pain, joint pain, myalgias and neck pain.  Skin: Negative for itching and rash.  Neurological: Negative for dizziness, tremors and weakness.  Endo/Heme/Allergies: Does not bruise/bleed easily.  Psychiatric/Behavioral: Negative for depression. The patient is not nervous/anxious and does not have insomnia.     Objective: BP 120/80   Ht 5' (1.524 m)   Wt 143 lb (64.9 kg)   BMI 27.93 kg/m  Physical Exam Constitutional:      General: She is not in acute distress.    Appearance: She is well-developed.  Genitourinary:     Pelvic exam was performed with patient supine.     Vagina and uterus normal.     No vaginal erythema or bleeding.     No cervical motion tenderness, discharge, polyp or nabothian cyst.     Uterus is mobile.     Uterus is not enlarged.     No uterine mass detected.    Uterus is midaxial.     No right or left adnexal mass present.     Right adnexa not tender.  Left adnexa not tender.     Genitourinary Comments: Gr 3 rectocele Gr 1 cystocele  Min Uterine prolapse Mild vag atrophy  HENT:     Head: Normocephalic and atraumatic.     Nose: Nose normal.  Abdominal:     General: There is no distension.     Palpations: Abdomen is soft.     Tenderness: There is no abdominal tenderness.  Musculoskeletal: Normal range of motion.  Neurological:     Mental Status: She is alert and oriented to person, place, and time.     Cranial Nerves: No cranial nerve deficit.  Skin:    General: Skin is warm and dry.     ASSESSMENT/PLAN:   Problem List Items Addressed This Visit      Digestive   Rectocele     Genitourinary   Vaginal atrophy    Other Visit Diagnoses    Screening for cervical cancer    -  Primary   Relevant Orders   Cytology - PAP   RLQ abdominal pain        Replens discussed as first line therapy for her atrophy.  Alternatives also discussed  Rectocele repair vs pessary discussed at length.  Monitor freq and intensity of sx's for now.    If surgery would do AP repair.  Uterus seems intact and not beneficial for TVH to be done.  No GYN cause for R sided pain.  Consider CT to assess GI and other etiologies  Barnett Applebaum, MD, [facility], [facility] 08/16/2018  3:36 PM

## 2018-08-19 ENCOUNTER — Encounter: Payer: Self-pay | Admitting: Family Medicine

## 2018-08-19 ENCOUNTER — Ambulatory Visit (INDEPENDENT_AMBULATORY_CARE_PROVIDER_SITE_OTHER): Payer: Medicare Other | Admitting: Family Medicine

## 2018-08-19 ENCOUNTER — Other Ambulatory Visit: Payer: Self-pay

## 2018-08-19 VITALS — BP 125/73 | Temp 98.2°F

## 2018-08-19 DIAGNOSIS — R109 Unspecified abdominal pain: Secondary | ICD-10-CM

## 2018-08-19 DIAGNOSIS — R1031 Right lower quadrant pain: Secondary | ICD-10-CM

## 2018-08-19 NOTE — Progress Notes (Signed)
Nicole Young  MRN: [id number] DOB: [birth year]  Subjective:  HPI   The patient is a 78 year old female who presents via phone for complaint of persistent abdominal pain.  She was seen for this on 08/07/18 by Dr Rosanna Randy.  At that time he ordered labs and abdominal/pelvic ultrasound.  Her labs including Met C, CBC and Lipase were WNL. Also, had GYN exam on 08-16-18 by Dr. Kenton Kingfisher with diagnosis of rectocele and vaginal atrophy - no GYN pathology to explain the right abdominal discomfort. Dr. Kenton Kingfisher recommended CT scan to evaluate further if discomfort persists.  Her abdominal ultrasound results were as follows: IMPRESSION: 1. The common bile duct is not visualized due to shadowing bowel gas. 2. Increased echogenicity of the liver is a nonspecific finding but often due to hepatic steatosis. 3. No other abnormalities.  Her pelvic ultrasound results were as follows: IMPRESSION: 1. 1.6 cm uterine fibroid. 2. 10 mm simple cyst in the left ovary of no significance. 3. Poor visualization of the right ovary which is grossly unremarkable.   Patient Active Problem List   Diagnosis Date Noted  . Vaginal atrophy 08/16/2018  . Rectocele 08/16/2018  . Scoliosis of lumbar spine 07/06/2016  . Degeneration of lumbar intervertebral disc 07/06/2016  . Arthralgia of multiple joints 11/12/2014  . Allergic rhinitis 11/12/2014  . Diabetes mellitus ([place]) 11/12/2014  . Essential (primary) hypertension 11/12/2014  . Personal history of other drug therapy 11/12/2014  . HLD (hyperlipidemia) 11/12/2014  . Adult hypothyroidism 11/12/2014  . Mild major depression ([place]) 11/12/2014  . Overweight 11/12/2014  . Restless leg 11/12/2014  . Encounter for screening colonoscopy 04/17/2014  . Type 2 diabetes mellitus ([place]) 12/23/2013    Past Medical History:  Diagnosis Date  . Complication of anesthesia    slow to wake up in past  . Depression    controlled  . Diabetes mellitus without complication ([place])     controlled  . Hypothyroidism   . Neuropathy   . Osteoarthritis    knee, ankles, shoulders, back, neck, hands/fingers, wrists;   . Rectocele   . Thyroid disease     Social History   Socioeconomic History  . Marital status: Widowed    Spouse name: Not on file  . Number of children: 3  . Years of education: Not on file  . Highest education level: Bachelor's degree (e.g., BA, AB, BS)  Occupational History  . Occupation: retired  Scientific laboratory technician  . Financial resource strain: Not hard at all  . Food insecurity    Worry: Never true    Inability: Never true  . Transportation needs    Medical: No    Non-medical: No  Tobacco Use  . Smoking status: Never Smoker  . Smokeless tobacco: Never Used  Substance and Sexual Activity  . Alcohol use: Yes    Alcohol/week: 2.0 - 3.0 standard drinks    Types: 2 - 3 Glasses of wine per week  . Drug use: No  . Sexual activity: Not on file  Lifestyle  . Physical activity    Days per week: 0 days    Minutes per session: 0 min  . Stress: Not at all  Relationships  . Social Herbalist on phone: Patient refused    Gets together: Patient refused    Attends religious service: Patient refused    Active member of club or organization: Patient refused    Attends meetings of clubs or organizations: Patient refused    Relationship  status: Patient refused  . Intimate partner violence    Fear of current or ex partner: Patient refused    Emotionally abused: Patient refused    Physically abused: Patient refused    Forced sexual activity: Patient refused  Other Topics Concern  . Not on file  Social History Narrative  . Not on file    Outpatient Encounter Medications as of 08/19/2018  Medication Sig  . Accu-Chek FastClix Lancets MISC Check sugar once daily. DX: E11.9  . azelastine (ASTELIN) 0.1 % nasal spray Place 2 sprays into both nostrils. Reported on 06/21/2015  . diazepam (VALIUM) 5 MG tablet TAKE ONE TABLET BY MOUTH NIGHTLY AT BEDTIME  AS NEEDED  . glucose blood (ACCU-CHEK SMARTVIEW) test strip 1 each by Other route as needed for other. Use as instructed  . levothyroxine (SYNTHROID, LEVOTHROID) 75 MCG tablet TAKE 1 TABLET DAILY  . Magnesium 250 MG TABS Take 1 tablet by mouth daily.  . metFORMIN (GLUCOPHAGE-XR) 500 MG 24 hr tablet Take 1 tablet (500 mg total) by mouth 2 (two) times daily. 1 po q am and 2 po q pm  . Multiple Vitamin (MULTIVITAMIN) capsule Take 1 capsule by mouth daily.  . pantoprazole (PROTONIX) 20 MG tablet TAKE 1 TABLET DAILY  . rosuvastatin (CRESTOR) 10 MG tablet Take 1 tablet (10 mg total) by mouth at bedtime.  . sertraline (ZOLOFT) 100 MG tablet TAKE 1 TABLET DAILY  . AZO-CRANBERRY PO Take 2 tablets by mouth daily.  . Calcium-Vitamin D-Vitamin K (CALCIUM SOFT CHEWS PO) Take by mouth.  . Calcium-Vitamin D-Vitamin K [id number] MG-UNT-MCG CHEW Chew by mouth.  . cholecalciferol (VITAMIN D3) 25 MCG (1000 UT) tablet Take by mouth daily. Unsure of dose  . NON FORMULARY Drinks Turmeric tea 2-3 times a day.  Marland Kitchen Phenazopyridine HCl (AZO STANDARD MAXIMUM STRENGTH PO) Take 1 tablet by mouth daily.   No facility-administered encounter medications on file as of 08/19/2018.     Allergies  Allergen Reactions  . Neomycin-Polymyxin-Gramicidin Other (See Comments)    Other Reaction: Other reaction  . Wilder Glade [Dapagliflozin] Other (See Comments)    Weak and dizzy   . Neosporin [Neomycin-Bacitracin Zn-Polymyx] Swelling  . Thimerosal Swelling  . Zostavax  [Zoster Vaccine Live] Rash    Do Not Administer Zostavax to Pt!    Review of Systems  Constitutional: Positive for weight loss (intentional). Negative for chills, diaphoresis, fever and malaise/fatigue.  Gastrointestinal: Positive for abdominal pain, constipation, heartburn (takes protonics) and nausea (constant, especially with empty stomach). Negative for blood in stool, diarrhea, melena and vomiting.    Objective:  BP 125/73   Temp 98.2 F (36.8 C)  (Taken by  the patient at home)  During telephonic conference, patient describes intermittent right abdominal pains. No fever, rash or hematochezia/melena.  Assessment and Plan :  1. Right sided abdominal pain Developed right abdominal sharp stabbing pain with some mild nausea intermittently over the past 2 weeks. Denies fever, melena, hematochezia or vomiting. Some constipation occasionally. Ultrasound on 08-15-18 was inconclusive for pathology. Labs were essentially normal on 08-07-18. Was evaluated by GYN (Dr. Kenton Kingfisher) without significant GYN pathology to explain pain. He recommended considering a CT scan evaluation with persistent abdominal pain. Schedule CT for possible appendicitis, colitis/diverticulitis or renal stones. Recommend liquid diet and may need to go to ER if pain becomes acute. - CT Abdomen Pelvis Wo Contrast  2. Right lower quadrant abdominal pain Onset over the past 2 weeks with above evaluation accomplished. Check CT scan with and without  contrast to evaluate for chronic appendicitis versus colitis or renal stones. - CT ABDOMEN PELVIS W CONTRAST - CT Abdomen Pelvis Wo Contrast

## 2018-08-20 LAB — CYTOLOGY - PAP
Diagnosis: NEGATIVE
HPV: DETECTED — AB

## 2018-08-26 ENCOUNTER — Other Ambulatory Visit: Payer: Self-pay

## 2018-08-26 ENCOUNTER — Telehealth: Payer: Self-pay

## 2018-08-26 ENCOUNTER — Ambulatory Visit
Admission: RE | Admit: 2018-08-26 | Discharge: 2018-08-26 | Disposition: A | Discharge status: Home / Self Care | Payer: Medicare Other | Source: Ambulatory Visit | Attending: Family Medicine | Admitting: Family Medicine

## 2018-08-26 DIAGNOSIS — R1031 Right lower quadrant pain: Secondary | ICD-10-CM | POA: Diagnosis not present

## 2018-08-26 MED ORDER — IOHEXOL 300 MG/ML  SOLN
100.0000 mL | Freq: Once | INTRAMUSCULAR | Status: AC | PRN
  Administered 2018-08-26: 100 mL via INTRAVENOUS

## 2018-08-26 NOTE — Telephone Encounter (Signed)
-----   Message from Margo Common, [place] sent at 08/26/2018  3:26 PM EDT ----- No evidence of appendicitis or other acute pathology to explain right abdominal discomfort. If having persistent pain, please let us know.

## 2018-08-26 NOTE — Telephone Encounter (Signed)
Patient notified of results. She states she is feeling okay, but is concerned she needs a colonoscopy. Please advise.

## 2018-08-27 ENCOUNTER — Telehealth: Payer: Self-pay

## 2018-08-27 NOTE — Telephone Encounter (Signed)
Patient notified of CT results

## 2018-08-27 NOTE — Telephone Encounter (Signed)
-----   Message from Jerrol Banana., MD sent at 08/27/2018  8:11 AM EDT ----- Fatty liver only on CT.

## 2018-08-30 NOTE — Telephone Encounter (Signed)
Last colonoscopy on 06-26-14 showed only one benign polyp and some diverticulosis without infection. Dr. Bary Castilla felt she did not need any more colonoscopies at her age unless she is having significant bowel symptoms.

## 2018-09-02 ENCOUNTER — Telehealth: Payer: Self-pay | Admitting: Family Medicine

## 2018-09-02 DIAGNOSIS — M1711 Unilateral primary osteoarthritis, right knee: Secondary | ICD-10-CM | POA: Diagnosis not present

## 2018-09-02 NOTE — Telephone Encounter (Signed)
The CT scan of the abdomen and pelvis with contrast got good visualization of the intestines and did not see any pathology of concern in the intestines/colon. The last colonoscopy done in 2016 did not show any signs of malignancy and usually is not repeated sooner than 5-10 years. If no longer having any abdominal symptoms, you should discuss need for referral to a gastroenterologist for possible colonoscopy or upper endoscopy at your appointment with Nicole Young in November. If having blood in stools or worsening of acid reflux while taking the Pantoprazole regularly, we could schedule the referral sooner.

## 2018-09-02 NOTE — Telephone Encounter (Signed)
Patient states that despite Dr. Bary Castilla opinion she feels more comfortable getting colonoscopy screen. Patient states that she has a family history of stage 4 colon cancer and has concerns. Patient is also requesting order for a endoscopy to checking for scarring due to her history of acid reflux. KW

## 2018-09-02 NOTE — Telephone Encounter (Signed)
[id number] 

## 2018-09-02 NOTE — Telephone Encounter (Signed)
Pt returning Kat's call today.  Please call pt back.  Thanks, [employer]

## 2018-09-03 NOTE — Telephone Encounter (Signed)
Patient is no longer having any severe pain and feels she would like to wait and she will discuss it with Dr Rosanna Randy.  Unless, something changes between now and her next visit.

## 2018-09-06 ENCOUNTER — Other Ambulatory Visit: Payer: Self-pay | Admitting: Family Medicine

## 2018-09-06 DIAGNOSIS — Z1231 Encounter for screening mammogram for malignant neoplasm of breast: Secondary | ICD-10-CM

## 2018-09-20 ENCOUNTER — Other Ambulatory Visit: Payer: Self-pay | Admitting: Family Medicine

## 2018-10-14 ENCOUNTER — Ambulatory Visit
Admission: RE | Admit: 2018-10-14 | Discharge: 2018-10-14 | Disposition: A | Discharge status: Home / Self Care | Payer: Medicare Other | Source: Ambulatory Visit | Attending: Family Medicine | Admitting: Family Medicine

## 2018-10-14 DIAGNOSIS — Z1231 Encounter for screening mammogram for malignant neoplasm of breast: Secondary | ICD-10-CM | POA: Diagnosis not present

## 2018-10-14 DIAGNOSIS — Z780 Asymptomatic menopausal state: Secondary | ICD-10-CM | POA: Diagnosis not present

## 2018-10-14 DIAGNOSIS — M8589 Other specified disorders of bone density and structure, multiple sites: Secondary | ICD-10-CM | POA: Diagnosis not present

## 2018-10-14 DIAGNOSIS — Z23 Encounter for immunization: Secondary | ICD-10-CM | POA: Diagnosis not present

## 2018-10-14 DIAGNOSIS — E2839 Other primary ovarian failure: Secondary | ICD-10-CM | POA: Insufficient documentation

## 2018-10-15 ENCOUNTER — Telehealth: Payer: Self-pay | Admitting: *Deleted

## 2018-10-15 NOTE — Telephone Encounter (Signed)
LMOVM for pt to return call 

## 2018-10-15 NOTE — Telephone Encounter (Signed)
Patient was notified of results. Expressed understanding.  

## 2018-10-15 NOTE — Telephone Encounter (Signed)
-----   Message from Jerrol Banana., MD sent at 10/15/2018  3:02 PM EDT ----- Osteopenia but not osteoporotic at this time.  Recommend vitamin D and calcium daily and regular walking.

## 2018-10-16 DIAGNOSIS — H35371 Puckering of macula, right eye: Secondary | ICD-10-CM | POA: Diagnosis not present

## 2018-10-16 LAB — HM DIABETES EYE EXAM

## 2018-10-22 ENCOUNTER — Telehealth: Payer: Self-pay | Admitting: Family Medicine

## 2018-10-22 ENCOUNTER — Other Ambulatory Visit: Payer: Self-pay

## 2018-10-22 DIAGNOSIS — E133599 Other specified diabetes mellitus with proliferative diabetic retinopathy without macular edema, unspecified eye: Secondary | ICD-10-CM

## 2018-10-22 NOTE — Telephone Encounter (Signed)
Pt needing another Endocrinologist referral - eye doctor thinks pt may have diabetic retinopathy. She hasn't seen Dr. Gracy Bruins Solum at [facility] in 3 years.  She is now needing to see her due to the eye doctor's request.  Thanks, [facility]

## 2018-10-22 NOTE — Telephone Encounter (Signed)
Referral placed, patient notified.

## 2018-10-22 NOTE — Telephone Encounter (Signed)
Please advise 

## 2018-10-22 NOTE — Telephone Encounter (Signed)
Ok with me 

## 2018-11-12 DIAGNOSIS — H353221 Exudative age-related macular degeneration, left eye, with active choroidal neovascularization: Secondary | ICD-10-CM | POA: Diagnosis not present

## 2018-11-12 DIAGNOSIS — H353112 Nonexudative age-related macular degeneration, right eye, intermediate dry stage: Secondary | ICD-10-CM | POA: Diagnosis not present

## 2018-11-20 ENCOUNTER — Ambulatory Visit: Payer: Self-pay | Admitting: Family Medicine

## 2018-12-04 DIAGNOSIS — E119 Type 2 diabetes mellitus without complications: Secondary | ICD-10-CM | POA: Diagnosis not present

## 2018-12-11 ENCOUNTER — Encounter: Payer: Self-pay | Admitting: Family Medicine

## 2018-12-11 ENCOUNTER — Ambulatory Visit (INDEPENDENT_AMBULATORY_CARE_PROVIDER_SITE_OTHER): Payer: Medicare Other | Admitting: Family Medicine

## 2018-12-11 ENCOUNTER — Other Ambulatory Visit: Payer: Self-pay

## 2018-12-11 VITALS — BP 108/60 | HR 87 | Temp 97.0°F | Resp 16 | Wt 140.8 lb

## 2018-12-11 DIAGNOSIS — E119 Type 2 diabetes mellitus without complications: Secondary | ICD-10-CM

## 2018-12-11 DIAGNOSIS — N952 Postmenopausal atrophic vaginitis: Secondary | ICD-10-CM

## 2018-12-11 DIAGNOSIS — H353220 Exudative age-related macular degeneration, left eye, stage unspecified: Secondary | ICD-10-CM

## 2018-12-11 DIAGNOSIS — E039 Hypothyroidism, unspecified: Secondary | ICD-10-CM

## 2018-12-11 DIAGNOSIS — F320 Major depressive disorder, single episode, mild: Secondary | ICD-10-CM

## 2018-12-11 LAB — POCT GLYCOSYLATED HEMOGLOBIN (HGB A1C)
Est. average glucose Bld gHb Est-mCnc: 134
Hemoglobin A1C: 6.3 % — AB (ref 4.0–5.6)

## 2018-12-11 NOTE — Progress Notes (Signed)
Patient: Nicole Young Female    DOB: 12/26/1940   78 y.o.   MRN: [id number] Visit Date: 12/11/2018  Today's Provider: [name], MD   Chief Complaint  Patient presents with  . Follow-up    T2DM,HTN, Hyperlipidemia   Subjective:    I,Nicole Young,RMA am acting as a Education administrator for Nicole Young., MD.  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  It is completely unrelated to her diabetes.  She was worried about this. Type 2 diabetes mellitus without complication, without long-term current use of insulin [facility]):  Lab Results  Component Value Date   HGBA1C 6.3 (A) 12/11/2018   HGBA1C 6.6 (H) 07/24/2018   HGBA1C 7.3 (A) 03/19/2018   Current symptoms include visual disturbances and have been stable. She was just diagnosed with macular degeneration and takes preservision. Home blood sugar records: fasting range: 110's-120's  Episodes of hypoglycemia? no   Pertinent Labs:    Component Value Date/Time   CHOL 133 07/24/2018 0754   TRIG 134 07/24/2018 0754   HDL 51 07/24/2018 0754   LDLCALC 55 07/24/2018 0754   CREATININE 0.63 08/07/2018 1051    Essential hypertension From 07/23/2018-labs good.  Patient here for follow-up . She is exercising and is adherent to low salt diet.  Blood pressure is well controlled at home. Cardiac symptoms none. Patient denies chest pain, chest pressure/discomfort, fatigue, irregular heart beat, lower extremity edema, near-syncope and palpitations.  Cardiovascular risk factors: advanced age (older than 40 for men, 45 for women), diabetes mellitus, dyslipidemia and hypertension.   Mixed hyperlipidemia  From 07/23/2018-On Crestor. Labs good.  Restless leg From 07/23/2018-Pt advised to limit Diazepam PRN.   Wt Readings from Last 3 Encounters:  12/11/18 140 lb 12.8 oz (63.9 kg)  08/16/18 143 lb (64.9 kg)  08/07/18 141 lb (64 kg)    Allergies  Allergen Reactions   . Neomycin-Polymyxin-Gramicidin Other (See Comments)    Other Reaction: Other reaction  . Wilder Glade [Dapagliflozin] Other (See Comments)    Weak and dizzy   . Neosporin [Neomycin-Bacitracin Zn-Polymyx] Swelling  . Sulfa Antibiotics   . Thimerosal Swelling  . Zostavax  [Zoster Vaccine Live] Rash    Do Not Administer Zostavax to Pt!     Current Outpatient Medications:  .  Accu-Chek FastClix Lancets MISC, CHECK SUGAR ONCE DAILY. DX: E11.9, Disp: 102 each, Rfl: 12 .  Calcium-Vitamin D-Vitamin K (CALCIUM SOFT CHEWS PO), Take by mouth., Disp: , Rfl:  .  cholecalciferol (VITAMIN D3) 25 MCG (1000 UT) tablet, Take by mouth daily. Unsure of dose, Disp: , Rfl:  .  diazepam (VALIUM) 5 MG tablet, TAKE ONE TABLET BY MOUTH NIGHTLY AT BEDTIME AS NEEDED, Disp: 30 tablet, Rfl: 1 .  erythromycin ophthalmic ointment, Apply 1/4 a small amount into left eye three times a day  Apply 1 a small amount in affected eye 3 times a day for 3 days, Disp: , Rfl:  .  glucose blood (ACCU-CHEK SMARTVIEW) test strip, 1 each by Other route as needed for other. Use as instructed, Disp: 100 strip, Rfl: 12 .  ketorolac (ACULAR) 0.5 % ophthalmic solution, Instill 1 drop into left eye four times a day  1 gtt qid operative eye for 5 days, Disp: , Rfl:  .  levothyroxine (SYNTHROID, LEVOTHROID) 75 MCG tablet, TAKE 1 TABLET DAILY, Disp: 90 tablet, Rfl: 3 .  Magnesium 250 MG TABS, Take 1 tablet by  mouth daily., Disp: , Rfl:  .  metFORMIN (GLUCOPHAGE-XR) 500 MG 24 hr tablet, Take 1 tablet (500 mg total) by mouth 2 (two) times daily. 1 po q am and 2 po q pm, Disp: 180 tablet, Rfl: 3 .  Multiple Vitamin (MULTIVITAMIN) capsule, Take 1 capsule by mouth daily., Disp: , Rfl:  .  Multiple Vitamins-Minerals (PRESERVISION AREDS PO), Take by mouth 2 (two) times daily., Disp: , Rfl:  .  pantoprazole (PROTONIX) 20 MG tablet, Take 20 mg by mouth daily., Disp: , Rfl:  .  rosuvastatin (CRESTOR) 10 MG tablet, Take 1 tablet (10 mg total) by mouth at  bedtime., Disp: 90 tablet, Rfl: 3 .  sertraline (ZOLOFT) 100 MG tablet, TAKE 1 TABLET DAILY, Disp: 90 tablet, Rfl: 4 .  meloxicam (MOBIC) 15 MG tablet, Take 15 mg by mouth daily., Disp: , Rfl:   Review of Systems  Constitutional: Negative for appetite change, chills, fatigue and fever.  HENT: Negative.   Eyes:       See HPI.  Respiratory: Negative for chest tightness and shortness of breath.   Cardiovascular: Negative for chest pain and palpitations.  Gastrointestinal: Negative for abdominal pain, nausea and vomiting.  Endocrine: Negative.   Allergic/Immunologic: Negative.   Neurological: Negative for dizziness and weakness.  Hematological: Negative.   Psychiatric/Behavioral: Negative.     Social History   Tobacco Use  . Smoking status: Never Smoker  . Smokeless tobacco: Never Used  Substance Use Topics  . Alcohol use: Yes    Alcohol/week: 2.0 - 3.0 standard drinks    Types: 2 - 3 Glasses of wine per week      Objective:   BP 108/60 (BP Location: Left Arm, Patient Position: Sitting, Cuff Size: Large)   Pulse 87   Temp (!) 97 F (36.1 C) (Temporal)   Resp 16   Wt 140 lb 12.8 oz (63.9 kg)   BMI 27.50 kg/m  Vitals:   12/11/18 1359  BP: 108/60  Pulse: 87  Resp: 16  Temp: (!) 97 F (36.1 C)  TempSrc: Temporal  Weight: 140 lb 12.8 oz (63.9 kg)  Body mass index is 27.5 kg/m.   Physical Exam Vitals signs reviewed.  Constitutional:      Appearance: She is well-developed.  HENT:     Head: Normocephalic and atraumatic.     Right Ear: External ear normal.     Left Ear: External ear normal.     Nose: Nose normal.  Eyes:     General: No scleral icterus.    Conjunctiva/sclera: Conjunctivae normal.  Neck:     Thyroid: No thyromegaly.  Cardiovascular:     Rate and Rhythm: Normal rate and regular rhythm.     Heart sounds: Normal heart sounds.  Pulmonary:     Effort: Pulmonary effort is normal.     Breath sounds: Normal breath sounds.  Abdominal:     Palpations:  Abdomen is soft.     Tenderness: There is no guarding or rebound. Negative signs include Murphy's sign and McBurney's sign.  Skin:    General: Skin is warm and dry.  Neurological:     General: No focal deficit present.     Mental Status: She is alert and oriented to person, place, and time.     Comments: Normal sensory foot exam  Psychiatric:        Mood and Affect: Mood normal.        Behavior: Behavior normal.        Thought Content:  Thought content normal.        Judgment: Judgment normal.      Results for orders placed or performed in visit on 12/11/18  POCT HgB A1C  Result Value Ref Range   Hemoglobin A1C 6.3 (A) 4.0 - 5.6 %   Est. average glucose Bld gHb Est-mCnc 134        Assessment & Plan    1. Type 2 diabetes mellitus without complication, without long-term current use of insulin (HCC) - POCT HgB A1C-6.3 today.  Diabetic foot exam is normal today.More than 50% 25 minute visit spent in counseling or coordination of care  Excellent control.  Patient has intentionally lost 13 pounds over the past 2 years. Needs microalbumin on next visit 2. Adult hypothyroidism   3. 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 5.Wet macular degeneration of left eye     [name], MD  [facility]

## 2018-12-17 DIAGNOSIS — H353221 Exudative age-related macular degeneration, left eye, with active choroidal neovascularization: Secondary | ICD-10-CM | POA: Diagnosis not present

## 2019-02-03 ENCOUNTER — Other Ambulatory Visit: Payer: Self-pay

## 2019-02-03 ENCOUNTER — Ambulatory Visit (INDEPENDENT_AMBULATORY_CARE_PROVIDER_SITE_OTHER): Payer: Medicare Other

## 2019-02-03 DIAGNOSIS — Z0000 Encounter for general adult medical examination without abnormal findings: Secondary | ICD-10-CM

## 2019-02-03 NOTE — Patient Instructions (Signed)
Nicole Young , Thank you for taking time to come for your Medicare Wellness Visit. I appreciate your ongoing commitment to your health goals. Please review the following plan we discussed and let me know if I can assist you in the future.   Screening recommendations/referrals: Colonoscopy: No longer required.  Mammogram: No longer required.  Bone Density: Up to date, due 10/2023 Recommended yearly ophthalmology/optometry visit for glaucoma screening and checkup Recommended yearly dental visit for hygiene and checkup  Vaccinations: Influenza vaccine: Up to date Pneumococcal vaccine: Completed series Tdap vaccine: Up to date, due 02/2023 Shingles vaccine: Pt declines today.     Advanced directives: Please bring a copy of your POA (Power of Attorney) and/or Living Will to your next appointment.    Conditions/risks identified: Recommend to increase water intake to 6-8 8 oz glasses a day.  - Next appointment: 06/11/19 @ 10:20 AM with Dr Rosanna Randy. Declined scheduling an AWV for 2022 at this time.    Preventive Care 37 Years and Older, Female Preventive care refers to lifestyle choices and visits with your health care provider that can promote health and wellness. What does preventive care include?  A yearly physical exam. This is also called an annual well check.  Dental exams once or twice a year.  Routine eye exams. Ask your health care provider how often you should have your eyes checked.  Personal lifestyle choices, including:  Daily care of your teeth and gums.  Regular physical activity.  Eating a healthy diet.  Avoiding tobacco and drug use.  Limiting alcohol use.  Practicing safe sex.  Taking low-dose aspirin every day.  Taking vitamin and mineral supplements as recommended by your health care provider. What happens during an annual well check? The services and screenings done by your health care provider during your annual well check will depend on your age, overall  health, lifestyle risk factors, and family history of disease. Counseling  Your health care provider may ask you questions about your:  Alcohol use.  Tobacco use.  Drug use.  Emotional well-being.  Home and relationship well-being.  Sexual activity.  Eating habits.  History of falls.  Memory and ability to understand (cognition).  Work and work Statistician.  Reproductive health. Screening  You may have the following tests or measurements:  Height, weight, and BMI.  Blood pressure.  Lipid and cholesterol levels. These may be checked every 5 years, or more frequently if you are over 25 years old.  Skin check.  Lung cancer screening. You may have this screening every year starting at age 28 if you have a 30-pack-year history of smoking and currently smoke or have quit within the past 15 years.  Fecal occult blood test (FOBT) of the stool. You may have this test every year starting at age 3.  Flexible sigmoidoscopy or colonoscopy. You may have a sigmoidoscopy every 5 years or a colonoscopy every 10 years starting at age 62.  Hepatitis C blood test.  Hepatitis B blood test.  Sexually transmitted disease (STD) testing.  Diabetes screening. This is done by checking your blood sugar (glucose) after you have not eaten for a while (fasting). You may have this done every 1-3 years.  Bone density scan. This is done to screen for osteoporosis. You may have this done starting at age 45.  Mammogram. This may be done every 1-2 years. Talk to your health care provider about how often you should have regular mammograms. Talk with your health care provider about your test  results, treatment options, and if necessary, the need for more tests. Vaccines  Your health care provider may recommend certain vaccines, such as:  Influenza vaccine. This is recommended every year.  Tetanus, diphtheria, and acellular pertussis (Tdap, Td) vaccine. You may need a Td booster every 10  years.  Zoster vaccine. You may need this after age 53.  Pneumococcal 13-valent conjugate (PCV13) vaccine. One dose is recommended after age 15.  Pneumococcal polysaccharide (PPSV23) vaccine. One dose is recommended after age 82. Talk to your health care provider about which screenings and vaccines you need and how often you need them. This information is not intended to replace advice given to you by your health care provider. Make sure you discuss any questions you have with your health care provider. Document Released: 01/22/2015 Document Revised: 09/15/2015 Document Reviewed: 10/27/2014 Elsevier Interactive Patient Education  2017 [place] Prevention in the Home Falls can cause injuries. They can happen to people of all ages. There are many things you can do to make your home safe and to help prevent falls. What can I do on the outside of my home?  Regularly fix the edges of walkways and driveways and fix any cracks.  Remove anything that might make you trip as you walk through a door, such as a raised step or threshold.  Trim any bushes or trees on the path to your home.  Use bright outdoor lighting.  Clear any walking paths of anything that might make someone trip, such as rocks or tools.  Regularly check to see if handrails are loose or broken. Make sure that both sides of any steps have handrails.  Any raised decks and porches should have guardrails on the edges.  Have any leaves, snow, or ice cleared regularly.  Use sand or salt on walking paths during winter.  Clean up any spills in your garage right away. This includes oil or grease spills. What can I do in the bathroom?  Use night lights.  Install grab bars by the toilet and in the tub and shower. Do not use towel bars as grab bars.  Use non-skid mats or decals in the tub or shower.  If you need to sit down in the shower, use a plastic, non-slip stool.  Keep the floor dry. Clean up any water that  spills on the floor as soon as it happens.  Remove soap buildup in the tub or shower regularly.  Attach bath mats securely with double-sided non-slip rug tape.  Do not have throw rugs and other things on the floor that can make you trip. What can I do in the bedroom?  Use night lights.  Make sure that you have a light by your bed that is easy to reach.  Do not use any sheets or blankets that are too big for your bed. They should not hang down onto the floor.  Have a firm chair that has side arms. You can use this for support while you get dressed.  Do not have throw rugs and other things on the floor that can make you trip. What can I do in the kitchen?  Clean up any spills right away.  Avoid walking on wet floors.  Keep items that you use a lot in easy-to-reach places.  If you need to reach something above you, use a strong step stool that has a grab bar.  Keep electrical cords out of the way.  Do not use floor polish or wax that makes  floors slippery. If you must use wax, use non-skid floor wax.  Do not have throw rugs and other things on the floor that can make you trip. What can I do with my stairs?  Do not leave any items on the stairs.  Make sure that there are handrails on both sides of the stairs and use them. Fix handrails that are broken or loose. Make sure that handrails are as long as the stairways.  Check any carpeting to make sure that it is firmly attached to the stairs. Fix any carpet that is loose or worn.  Avoid having throw rugs at the top or bottom of the stairs. If you do have throw rugs, attach them to the floor with carpet tape.  Make sure that you have a light switch at the top of the stairs and the bottom of the stairs. If you do not have them, ask someone to add them for you. What else can I do to help prevent falls?  Wear shoes that:  Do not have high heels.  Have rubber bottoms.  Are comfortable and fit you well.  Are closed at the  toe. Do not wear sandals.  If you use a stepladder:  Make sure that it is fully opened. Do not climb a closed stepladder.  Make sure that both sides of the stepladder are locked into place.  Ask someone to hold it for you, if possible.  Clearly mark and make sure that you can see:  Any grab bars or handrails.  First and last steps.  Where the edge of each step is.  Use tools that help you move around (mobility aids) if they are needed. These include:  Canes.  Walkers.  Scooters.  Crutches.  Turn on the lights when you go into a dark area. Replace any light bulbs as soon as they burn out.  Set up your furniture so you have a clear path. Avoid moving your furniture around.  If any of your floors are uneven, fix them.  If there are any pets around you, be aware of where they are.  Review your medicines with your doctor. Some medicines can make you feel dizzy. This can increase your chance of falling. Ask your doctor what other things that you can do to help prevent falls. This information is not intended to replace advice given to you by your health care provider. Make sure you discuss any questions you have with your health care provider. Document Released: 10/22/2008 Document Revised: 06/03/2015 Document Reviewed: 01/30/2014 Elsevier Interactive Patient Education  2017 [employer].

## 2019-02-03 NOTE — Progress Notes (Signed)
Subjective:   Nicole Young is a 79 y.o. female who presents for Medicare Annual (Subsequent) preventive examination.    This visit is being conducted through telemedicine due to the COVID-19 pandemic. This patient has given me verbal consent via doximity to conduct this visit, patient states they are participating from their home address. Some vital signs may be absent or patient reported.    Patient identification: identified by name, DOB, and current address  Review of Systems:  N/A  Cardiac Risk Factors include: advanced age (>32men, >19 women);diabetes mellitus;dyslipidemia     Objective:     Vitals: There were no vitals taken for this visit.  There is no height or weight on file to calculate BMI. Unable to obtain vitals due to visit being conducted via telephonically.   Advanced Directives 02/03/2019 02/08/2018 01/22/2017 12/16/2015 12/16/2015 12/07/2015 05/13/2015  Does Patient Have a Medical Advance Directive? Yes Yes Yes No No No Yes  Type of Advance Directive Healthcare Power of [place]  Does patient want to make changes to medical advance directive? - - - - - - No - Patient declined  Copy of [place] in Chart? No - copy requested No - copy requested No - copy requested - - - No - copy requested  Would patient like information on creating a medical advance directive? - - - - No - Patient declined - -    Tobacco Social History   Tobacco Use  Smoking Status Never Smoker  Smokeless Tobacco Never Used     Counseling given: Not Answered   Clinical Intake:  Pre-visit preparation completed: Yes  Pain : No/denies pain Pain Score: 0-No pain     Nutritional Risks: None Diabetes: Yes  How often do you need to have someone help you when you read instructions, pamphlets, or other written materials from your doctor or pharmacy?: 1 - Never   Diabetes:  Is  the patient diabetic?  Yes  If diabetic, was a CBG obtained today?  No  Did the patient bring in their glucometer from home?  No  How often do you monitor your CBG's? Once a day in the morning.   Financial Strains and Diabetes Management:  Are you having any financial strains with the device, your supplies or your medication? No .  Does the patient want to be seen by Chronic Care Management for management of their diabetes?  No  Would the patient like to be referred to a Nutritionist or for Diabetic Management?  No   Diabetic Exams:  Diabetic Eye Exam: Completed 10/16/18. Repeat yearly.  Diabetic Foot Exam: Completed 12/04/18. Repeat yearly.   Interpreter Needed?: No  Information entered by :: [facility], LPN  Past Medical History:  Diagnosis Date  . 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 Complication of anesthesia    slow to wake up in past  . Depression    controlled  . Diabetes mellitus without complication ([place])    controlled  . Hypothyroidism   . Neuropathy   . Osteoarthritis    knee, ankles, shoulders, back, neck, hands/fingers, wrists;   . Rectocele   . Thyroid disease   . 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[facility])    Past Surgical History:  Procedure Laterality Date  . AUGMENTATION MAMMAPLASTY  1975  . AUGMENTATION MAMMAPLASTY  2017   2nd augmentation and lift  . BREAST SURGERY     impants  replaced with lift  . CARPAL TUNNEL RELEASE Right 08/26/2014   Procedure: CARPAL TUNNEL RELEASE;  Surgeon: Earnestine Leys, MD;  Location: [facility] ORS;  Service: Orthopedics;  Laterality: Right;  . COLONOSCOPY  2005   Dr Bary Castilla  . COLONOSCOPY N/A 06/24/2014   Procedure: COLONOSCOPY;  Surgeon: Robert Bellow, MD;  Location: [facility];  Service: Endoscopy;  Laterality: N/A;  . PARATHYROIDECTOMY  1992  . PLACEMENT OF BREAST IMPLANTS  1975  . TONSILECTOMY, ADENOIDECTOMY, BILATERAL MYRINGOTOMY AND TUBES     Family History  Problem Relation Age of Onset  . Cancer Father         prostate  . Prostate cancer Father   . Hypertension Mother   . Heart attack Mother   . Heart attack Maternal Grandfather   . Diabetes Paternal Uncle   . Congestive Heart Failure Maternal Grandmother   . CVA Paternal Grandmother   . Prostate cancer Paternal Grandfather   . Breast cancer Maternal Aunt    Social History   Socioeconomic History  . Marital status: Widowed    Spouse name: Not on file  . Number of children: 3  . Years of education: Not on file  . Highest education level: Bachelor's degree (e.g., BA, AB, BS)  Occupational History  . Occupation: retired  Tobacco Use  . Smoking status: Never Smoker  . Smokeless tobacco: Never Used  Substance and Sexual Activity  . Alcohol use: Yes    Alcohol/week: 2.0 - 3.0 standard drinks    Types: 2 - 3 Glasses of wine per week  . Drug use: No  . Sexual activity: Not on file  Other Topics Concern  . Not on file  Social History Narrative  . Not on file   Social Determinants of Health   Financial Resource Strain: Low Risk   . Difficulty of Paying Living Expenses: Not hard at all  Food Insecurity: No Food Insecurity  . Worried About Charity fundraiser in the Last Year: Never true  . Ran Out of Food in the Last Year: Never true  Transportation Needs: No Transportation Needs  . Lack of Transportation (Medical): No  . Lack of Transportation (Non-Medical): No  Physical Activity: Sufficiently Active  . Days of Exercise per Week: 6 days  . Minutes of Exercise per Session: 40 min  Stress: No Stress Concern Present  . Feeling of Stress : Not at all  Social Connections: Moderately Isolated  . Frequency of Communication with Friends and Family: More than three times a week  . Frequency of Social Gatherings with Friends and Family: More than three times a week  . Attends Religious Services: Never  . Active Member of Clubs or Organizations: No  . Attends Archivist Meetings: Never  . Marital Status: Widowed     Outpatient Encounter Medications as of 02/03/2019  Medication Sig  . Accu-Chek FastClix Lancets MISC CHECK SUGAR ONCE DAILY. DX: E11.9  . Calcium-Vitamin D-Vitamin K (CALCIUM SOFT CHEWS PO) Take by mouth daily.   . cholecalciferol (VITAMIN D3) 25 MCG (1000 UT) tablet Take by mouth daily. Unsure of dose  . diazepam (VALIUM) 5 MG tablet TAKE ONE TABLET BY MOUTH NIGHTLY AT BEDTIME AS NEEDED  . erythromycin ophthalmic ointment Apply 1/4 a small amount into left eye three times a day  Apply 1 a small amount in affected eye 3 times a day for 3 days  . glucose blood (ACCU-CHEK SMARTVIEW) test strip 1 each by Other route as needed for other.  Use as instructed  . levothyroxine (SYNTHROID, LEVOTHROID) 75 MCG tablet TAKE 1 TABLET DAILY  . Magnesium 250 MG TABS Take 1 tablet by mouth daily.  . metFORMIN (GLUCOPHAGE-XR) 500 MG 24 hr tablet Take 1 tablet (500 mg total) by mouth 2 (two) times daily. 1 po q am and 2 po q pm (Patient taking differently: Take 1,000 mg by mouth daily with breakfast. )  . Multiple Vitamin (MULTIVITAMIN) capsule Take 1 capsule by mouth daily.  . Multiple Vitamins-Minerals (PRESERVISION AREDS PO) Take by mouth 2 (two) times daily.  . pantoprazole (PROTONIX) 20 MG tablet Take 20 mg by mouth daily.  . rosuvastatin (CRESTOR) 10 MG tablet Take 1 tablet (10 mg total) by mouth at bedtime.  . sertraline (ZOLOFT) 100 MG tablet TAKE 1 TABLET DAILY  . ketorolac (ACULAR) 0.5 % ophthalmic solution Instill 1 drop into left eye four times a day  1 gtt qid operative eye for 5 days  . meloxicam (MOBIC) 15 MG tablet Take 15 mg by mouth daily.   No facility-administered encounter medications on file as of 02/03/2019.    Activities of Daily Living In your present state of health, do you have any difficulty performing the following activities: 02/03/2019 02/08/2018  Hearing? Y Y  Comment Does not wear hearing aids. Does not wear hearing aids.   Vision? N N  Comment Has wet MD. -  Difficulty  concentrating or making decisions? N N  Walking or climbing stairs? Y N  Comment Due to knee pains. -  Dressing or bathing? N N  Doing errands, shopping? N N  Preparing Food and eating ? N N  Using the Toilet? N N  In the past six months, have you accidently leaked urine? Y Y  Comment Rarely Occasionally with urges.  Do you have problems with loss of bowel control? N N  Managing your Medications? N N  Managing your Finances? N N  Housekeeping or managing your Housekeeping? N N  Some recent data might be hidden    Patient Care Team: Jerrol Banana., MD as PCP - General (Family Medicine) Birder Robson, MD as Referring Physician (Ophthalmology) Garrel Ridgel, DPM as Consulting Physician (Podiatry) Solum, Betsey Holiday, MD as Physician Assistant (Endocrinology) Gae Dry, MD as Referring Physician (Obstetrics and Gynecology) Isaias Sakai, MD as Referring Physician (Ophthalmology)    Assessment:   This is a routine wellness examination for [place].  Exercise Activities and Dietary recommendations Current Exercise Habits: Home exercise routine, Type of exercise: walking, Time (Minutes): 40(30-50 minutes), Frequency (Times/Week): 6, Weekly Exercise (Minutes/Week): 240, Intensity: Moderate, Exercise limited by: orthopedic condition(s)  Goals    . DIET - INCREASE WATER INTAKE     Continue drinking 40 oz of water a day and increase when able to.        Fall Risk: Fall Risk  02/03/2019 12/11/2018 03/19/2018 02/08/2018 07/17/2017  Falls in the past year? 0 0 0 0 No  Number falls in past yr: 0 0 - - -  Comment - - - - -  Injury with Fall? 0 0 - - -  Comment - - - - -  Follow up - Falls evaluation completed - - -    FALL RISK PREVENTION PERTAINING TO THE HOME:  Any stairs in or around the home? No  If so, are there any without handrails? N/A  Home free of loose throw rugs in walkways, pet beds, electrical cords, etc? Yes  Adequate lighting in your home to  reduce risk of  falls? Yes   ASSISTIVE DEVICES UTILIZED TO PREVENT FALLS:  Life alert? No  Use of a cane, walker or w/c? No  Grab bars in the bathroom? No  Shower chair or bench in shower? Yes  Elevated toilet seat or a handicapped toilet? Yes   TIMED UP AND GO:  Was the test performed? No .    Depression Screen PHQ 2/9 Scores 02/03/2019 12/11/2018 03/19/2018 02/08/2018  PHQ - 2 Score 0 0 0 0  PHQ- 9 Score - 0 - -  Exception Documentation - - Patient refusal -     Cognitive Function     6CIT Screen 02/03/2019 12/16/2015  What Year? 0 points 0 points  What month? 0 points 0 points  What time? 0 points 0 points  Count back from 20 0 points 0 points  Months in reverse 0 points 0 points  Repeat phrase 0 points 0 points  Total Score 0 0    Immunization History  Administered Date(s) Administered  . Influenza, High Dose Seasonal PF 10/17/2016, 11/10/2017, 10/14/2018  . Influenza-Unspecified 10/23/2014, 10/14/2018  . Pneumococcal Conjugate-13 12/16/2013  . Pneumococcal Polysaccharide-23 12/23/2015  . Tdap 02/14/2013    Qualifies for Shingles Vaccine? Yes . Due for Shingrix. Pt has been advised to call insurance company to determine out of pocket expense. Advised may also receive vaccine at local pharmacy or Health Dept. Verbalized acceptance and understanding.  Tdap: Up to date  Flu Vaccine: Up to date  Pneumococcal Vaccine: Completed series  Screening Tests Health Maintenance  Topic Date Due  . URINE MICROALBUMIN  10/17/2017  . HEMOGLOBIN A1C  06/11/2019  . OPHTHALMOLOGY EXAM  10/16/2019  . FOOT EXAM  12/04/2019  . TETANUS/TDAP  02/15/2023  . DEXA SCAN  10/14/2023  . INFLUENZA VACCINE  Completed  . PNA vac Low Risk Adult  Completed    Cancer Screenings:  Colorectal Screening: No longer required.   Mammogram: No longer required.   Bone Density: Completed 10/14/18. Results reflect OSTEOPENIA. Repeat every 5 years.   Lung Cancer Screening: (Low Dose CT Chest recommended if Age  27-80 years, 30 pack-year currently smoking OR have quit w/in 15years.) does not qualify.   Additional Screening:  Dental Screening: Recommended annual dental exams for proper oral hygiene   Community Resource Referral:  CRR required this visit?  No       Plan:  I have personally reviewed and addressed the Medicare Annual Wellness questionnaire and have noted the following in the patient's chart:  A. Medical and social history B. Use of alcohol, tobacco or illicit drugs  C. Current medications and supplements D. Functional ability and status E.  Nutritional status F.  Physical activity G. Advance directives H. List of other physicians I.  Hospitalizations, surgeries, and ER visits in previous 12 months J.  [place] such as hearing and vision if needed, cognitive and depression L. Referrals and appointments   In addition, I have reviewed and discussed with patient certain preventive protocols, quality metrics, and best practice recommendations. A written personalized care plan for preventive services as well as general preventive health recommendations were provided to patient.   Glendora Score, [place]  [id number] Nurse Health Advisor   Nurse Notes: Pt needs a urine check at next in office visit.

## 2019-03-03 DIAGNOSIS — H353221 Exudative age-related macular degeneration, left eye, with active choroidal neovascularization: Secondary | ICD-10-CM | POA: Diagnosis not present

## 2019-03-17 ENCOUNTER — Other Ambulatory Visit: Payer: Self-pay | Admitting: Family Medicine

## 2019-03-17 DIAGNOSIS — E7849 Other hyperlipidemia: Secondary | ICD-10-CM

## 2019-03-17 NOTE — Telephone Encounter (Signed)
Requested Prescriptions  Pending Prescriptions Disp Refills  . rosuvastatin (CRESTOR) 10 MG tablet [Pharmacy Med Name: ROSUVASTATIN TABS 10MG ] 90 tablet 3    Sig: TAKE 1 TABLET AT BEDTIME     Cardiovascular:  Antilipid - Statins Passed - 03/17/2019 12:11 AM      Passed - Total Cholesterol in normal range and within 360 days    Cholesterol, Total  Date Value Ref Range Status  07/24/2018 133 100 - 199 mg/dL Final         Passed - LDL in normal range and within 360 days    LDL Calculated  Date Value Ref Range Status  07/24/2018 55 0 - 99 mg/dL Final         Passed - HDL in normal range and within 360 days    HDL  Date Value Ref Range Status  07/24/2018 51 >39 mg/dL Final         Passed - Triglycerides in normal range and within 360 days    Triglycerides  Date Value Ref Range Status  07/24/2018 134 0 - 149 mg/dL Final         Passed - Patient is not pregnant      Passed - Valid encounter within last 12 months    Recent Outpatient Visits          3 months ago Type 2 diabetes mellitus without complication, without long-term current use of insulin ([place])   [facility] Jerrol Banana., MD   7 months ago Right sided abdominal pain   [place], [place], [place]   7 months ago Abdominal pain, unspecified abdominal location   [facility] Jerrol Banana., MD   7 months ago Type 2 diabetes mellitus without complication, without long-term current use of insulin [facility])   [facility] Jerrol Banana., MD   12 months ago Type 2 diabetes mellitus without complication, without long-term current use of insulin [facility])   [facility] Jerrol Banana., MD             . pantoprazole (PROTONIX) 20 MG tablet [Pharmacy Med Name: PANTOPRAZOLE SODIUM DR TABS 20MG ] 90 tablet 3    Sig: TAKE 1 TABLET DAILY     Gastroenterology: Proton Pump Inhibitors Passed - 03/17/2019 12:11 AM      Passed -  Valid encounter within last 12 months    Recent Outpatient Visits          3 months ago Type 2 diabetes mellitus without complication, without long-term current use of insulin [facility])   [facility] Jerrol Banana., MD   7 months ago Right sided abdominal pain   [place], [place], [place]   7 months ago Abdominal pain, unspecified abdominal location   [facility] Jerrol Ban[facility]., MD   7 months ago Type 2 diabetes mellitus without complication, without long-term current use of insulin [facility])   [facility] Jerrol Banana., MD   12 months ago Type 2 diabetes mellitus without complication, without long-term current use of insulin [facility])   [facility] Jerrol Banana., MD             . levothyroxine (SYNTHROID) 75 MCG tablet [Pharmacy Med Name: L-THYROXINE (SYNTHROID) TABS 75MCG] 90 tablet 3    Sig: TAKE 1 TABLET DAILY     Endocrinology:  Hypothyroid Agents Failed - 03/17/2019 12:11 AM  Failed - TSH needs to be rechecked within 3 months after an abnormal result. Refill until TSH is due.      Passed - TSH in normal range and within 360 days    TSH  Date Value Ref Range Status  07/24/2018 0.819 0.450 - 4.500 uIU/mL Final         Passed - Valid encounter within last 12 months    Recent Outpatient Visits          3 months ago Type 2 diabetes mellitus without complication, without long-term current use of insulin [facility])   [facility] Jerrol Banana., MD   7 months ago Right sided abdominal pain   [place], [place]   7 months ago Abdominal pain, unspecified abdominal location   [facility] Jerrol Banana., MD   7 months ago Type 2 diabetes mellitus without complication, without long-term current use of insulin [facility])   [facility] Jerrol Banana., MD   12 months ago Type 2 diabetes  mellitus without complication, without long-term current use of insulin [facility])   [facility] Jerrol Banana., MD

## 2019-03-17 NOTE — Telephone Encounter (Signed)
Requested medication (s) are due for refill today: yes  Requested medication (s) are on the active medication list: yes  Last refill: 12/19/2018  Future visit scheduled: yes  Notes to clinic:  historical provider    Requested Prescriptions  Pending Prescriptions Disp Refills   pantoprazole (PROTONIX) 20 MG tablet [Pharmacy Med Name: PANTOPRAZOLE SODIUM DR TABS 20MG ] 90 tablet 3    Sig: TAKE 1 TABLET DAILY      Gastroenterology: Proton Pump Inhibitors Passed - 03/17/2019 12:11 AM      Passed - Valid encounter within last 12 months    Recent Outpatient Visits           3 months ago Type 2 diabetes mellitus without complication, without long-term current use of insulin ([place])   [facility] Jerrol Ban[facility]., MD   7 months ago Right sided abdominal pain   [place], [place], [place]   7 months ago Abdominal pain, unspecified abdominal location   [facility] Jerrol Banana., MD   7 months ago Type 2 diabetes mellitus without complication, without long-term current use of insulin ([place])   [facility] Jerrol Banana., MD   12 months ago Type 2 diabetes mellitus without complication, without long-term current use of insulin [facility])   [facility] Jerrol Banana., MD               Signed Prescriptions Disp Refills   rosuvastatin (CRESTOR) 10 MG tablet 90 tablet 3    Sig: TAKE 1 TABLET AT BEDTIME      Cardiovascular:  Antilipid - Statins Passed - 03/17/2019 12:11 AM      Passed - Total Cholesterol in normal range and within 360 days    Cholesterol, Total  Date Value Ref Range Status  07/24/2018 133 100 - 199 mg/dL Final          Passed - LDL in normal range and within 360 days    LDL Calculated  Date Value Ref Range Status  07/24/2018 55 0 - 99 mg/dL Final          Passed - HDL in normal range and within 360 days    HDL  Date Value Ref Range Status  07/24/2018 51 >39  mg/dL Final          Passed - Triglycerides in normal range and within 360 days    Triglycerides  Date Value Ref Range Status  07/24/2018 134 0 - 149 mg/dL Final          Passed - Patient is not pregnant      Passed - Valid encounter within last 12 months    Recent Outpatient Visits           3 months ago Type 2 diabetes mellitus without complication, without long-term current use of insulin ([place])   [facility] Jerrol Banana., MD   7 months ago Right sided abdominal pain   [place], [place], [place]   7 months ago Abdominal pain, unspecified abdominal location   [facility] Jerrol Banana., MD   7 months ago Type 2 diabetes mellitus without complication, without long-term current use of insulin [facility])   [facility] Jerrol Banana., MD   12 months ago Type 2 diabetes mellitus without complication, without long-term current use of insulin [facility])   [facility] Jerrol Banana., MD  levothyroxine (SYNTHROID) 75 MCG tablet 90 tablet 3    Sig: TAKE 1 TABLET DAILY      Endocrinology:  Hypothyroid Agents Failed - 03/17/2019 12:11 AM      Failed - TSH needs to be rechecked within 3 months after an abnormal result. Refill until TSH is due.      Passed - TSH in normal range and within 360 days    TSH  Date Value Ref Range Status  07/24/2018 0.819 0.450 - 4.500 uIU/mL Final          Passed - Valid encounter within last 12 months    Recent Outpatient Visits           3 months ago Type 2 diabetes mellitus without complication, without long-term current use of insulin [facility])   [facility] Jerrol Banana., MD   7 months ago Right sided abdominal pain   [place], [place]   7 months ago Abdominal pain, unspecified abdominal location   [facility] Jerrol Banana., MD   7 months ago  Type 2 diabetes mellitus without complication, without long-term current use of insulin [facility])   [facility] Jerrol Banana., MD   12 months ago Type 2 diabetes mellitus without complication, without long-term current use of insulin [facility])   [facility] Jerrol Banana., MD

## 2019-04-28 ENCOUNTER — Encounter: Payer: Self-pay | Admitting: Family Medicine

## 2019-04-28 ENCOUNTER — Ambulatory Visit (INDEPENDENT_AMBULATORY_CARE_PROVIDER_SITE_OTHER): Payer: Medicare Other | Admitting: Family Medicine

## 2019-04-28 ENCOUNTER — Other Ambulatory Visit: Payer: Self-pay

## 2019-04-28 DIAGNOSIS — R509 Fever, unspecified: Secondary | ICD-10-CM

## 2019-04-28 NOTE — Progress Notes (Signed)
Virtual telephone visit    Virtual Visit via Telephone Note   This visit type was conducted due to national recommendations for restrictions regarding the COVID-19 Pandemic (e.g. social distancing) in an effort to limit this patient's exposure and mitigate transmission in our community. Due to her co-morbid illnesses, this patient is at least at moderate risk for complications without adequate follow up. This format is felt to be most appropriate for this patient at this time. The patient did not have access to video technology or had technical difficulties with video requiring transitioning to audio format only (telephone). Physical exam was limited to content and character of the telephone converstion.    Patient location: home Provider location: office   Patient: Nicole Young   DOB: 08/03/1940   80 y.o. Female  MRN: [id number] Visit Date: 04/28/2019  Today's Provider: Vernie Murders, PA  Subjective:    Chief Complaint  Patient presents with  . Fever   HPI  Yesterday she started having chills and achy legs at about 4-4:30.  She felt feverish around 5 pm and checked her temperature with a mercury thermometer and it was 101 degrees.  Her temperature lasted until 10 pm, at which time the fever broke and today she feels fine.  Temperature today is normal.     Past Medical History:  Diagnosis Date  . AMD (acid maltase deficiency) ([place])   . Complication of anesthesia    slow to wake up in past  . Depression    controlled  . Diabetes mellitus without complication ([place])    controlled  . Hypothyroidism   . Neuropathy   . Osteoarthritis    knee, ankles, shoulders, back, neck, hands/fingers, wrists;   . Rectocele   . Thyroid disease   . 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[facility])    Past Surgical History:  Procedure Laterality Date  . AUGMENTATION MAMMAPLASTY  1975  . AUGMENTATION MAMMAPLASTY  2017   2nd augmentation and lift  . BREAST SURGERY     impants replaced with lift   . CARPAL TUNNEL RELEASE Right 08/26/2014   Procedure: CARPAL TUNNEL RELEASE;  Surgeon: Earnestine Leys, MD;  Location: [facility] ORS;  Service: Orthopedics;  Laterality: Right;  . COLONOSCOPY  2005   Dr Bary Castilla  . COLONOSCOPY N/A 06/24/2014   Procedure: COLONOSCOPY;  Surgeon: Robert Bellow, MD;  Location: [facility] ENDOSCOPY;  Service: Endoscopy;  Laterality: N/A;  . PARATHYROIDECTOMY  1992  . PLACEMENT OF BREAST IMPLANTS  1975  . TONSILECTOMY, ADENOIDECTOMY, BILATERAL MYRINGOTOMY AND TUBES     Social History   Tobacco Use  . Smoking status: Never Smoker  . Smokeless tobacco: Never Used  Substance Use Topics  . Alcohol use: Yes    Alcohol/week: 2.0 - 3.0 standard drinks    Types: 2 - 3 Glasses of wine per week  . Drug use: No   Family History  Problem Relation Age of Onset  . Cancer Father        prostate  . Prostate cancer Father   . Hypertension Mother   . Heart attack Mother   . Heart attack Maternal Grandfather   . Diabetes Paternal Uncle   . Congestive Heart Failure Maternal Grandmother   . CVA Paternal Grandmother   . Prostate cancer Paternal Grandfather   . Breast cancer Maternal Aunt    Allergies  Allergen Reactions  . Neomycin-Polymyxin-Gramicidin Other (See Comments)    Other Reaction: Other reaction  . Wilder Glade [Dapagliflozin] Other (See Comments)  Weak and dizzy   . Neosporin [Neomycin-Bacitracin Zn-Polymyx] Swelling  . Sulfa Antibiotics   . Thimerosal Swelling  . Zostavax  [Zoster Vaccine Live] Rash    Do Not Administer Zostavax to Pt!      Medications: Outpatient Medications Prior to Visit  Medication Sig  . Calcium-Vitamin D-Vitamin K (CALCIUM SOFT CHEWS PO) Take by mouth daily.   . cholecalciferol (VITAMIN D3) 25 MCG (1000 UT) tablet Take by mouth daily. Unsure of dose  . diazepam (VALIUM) 5 MG tablet TAKE ONE TABLET BY MOUTH NIGHTLY AT BEDTIME AS NEEDED  . levothyroxine (SYNTHROID) 75 MCG tablet TAKE 1 TABLET DAILY  . Magnesium 250 MG TABS Take 1  tablet by mouth daily.  . meloxicam (MOBIC) 15 MG tablet Take 15 mg by mouth daily.  . metFORMIN (GLUCOPHAGE-XR) 500 MG 24 hr tablet Take 1 tablet (500 mg total) by mouth 2 (two) times daily. 1 po q am and 2 po q pm (Patient taking differently: Take 1,000 mg by mouth daily with breakfast. )  . Multiple Vitamin (MULTIVITAMIN) capsule Take 1 capsule by mouth daily.  . Multiple Vitamins-Minerals (PRESERVISION AREDS PO) Take by mouth 2 (two) times daily.  . rosuvastatin (CRESTOR) 10 MG tablet TAKE 1 TABLET AT BEDTIME  . sertraline (ZOLOFT) 100 MG tablet TAKE 1 TABLET DAILY  . Accu-Chek FastClix Lancets MISC CHECK SUGAR ONCE DAILY. DX: E11.9  . erythromycin ophthalmic ointment Apply 1/4 a small amount into left eye three times a day  Apply 1 a small amount in affected eye 3 times a day for 3 days  . glucose blood (ACCU-CHEK SMARTVIEW) test strip 1 each by Other route as needed for other. Use as instructed  . ketorolac (ACULAR) 0.5 % ophthalmic solution Instill 1 drop into left eye four times a day  1 gtt qid operative eye for 5 days  . pantoprazole (PROTONIX) 20 MG tablet TAKE 1 TABLET DAILY (Patient not taking: Reported on 04/28/2019)   No facility-administered medications prior to visit.    Review of Systems  Constitutional: Positive for chills (yesterday) and fever (yesterday). Negative for fatigue.  HENT: Negative for congestion, rhinorrhea, sinus pain and sore throat.   Respiratory: Negative for cough and shortness of breath.   Cardiovascular: Negative for chest pain and leg swelling.  Gastrointestinal: Negative for abdominal pain.  Genitourinary: Negative for dysuria.  Neurological: Negative for headaches.        Objective:    There were no vitals taken for this visit.  Physical: No apparent respiratory distress during telephonic interview.     Assessment & Plan:    Had symptoms of fatigue, chills and body aches for approximately 6 hours yesterday. Took 2 Tylenol and temperature  came back to normal by 10pm. Had taken her temperature with a digital thermometer and it registered 99. Did no believe it was that low and took it with a mercury oral thermometer and got 101. No symptoms or fever today. "Feeling fine." Will monitor for recurrence or any respiratory symptoms and notify this office if they occur. Increase fluid intake and may use the Tylenol prn. Had her second [place] vaccination on 02-04-19.  No follow-ups on file.    I discussed the assessment and treatment plan with the patient. The patient was provided an opportunity to ask questions and all were answered. The patient agreed with the plan and demonstrated an understanding of the instructions.   The patient was advised to call back or seek an in-person evaluation if the  symptoms worsen or if the condition fails to improve as anticipated.  I provided 15 minutes of non-face-to-face time during this encounter.  Andres Shad, PA, have reviewed all documentation for this visit. The documentation on 04/28/19 for the exam, diagnosis, procedures, and orders are all accurate and complete.   Vernie Murders, [place] [phone] (phone) [phone] (fax)  [place]

## 2019-04-29 DIAGNOSIS — H353221 Exudative age-related macular degeneration, left eye, with active choroidal neovascularization: Secondary | ICD-10-CM | POA: Diagnosis not present

## 2019-05-07 ENCOUNTER — Other Ambulatory Visit: Payer: Self-pay | Admitting: Family Medicine

## 2019-05-07 DIAGNOSIS — F320 Major depressive disorder, single episode, mild: Secondary | ICD-10-CM

## 2019-05-07 NOTE — Telephone Encounter (Signed)
Requested Prescriptions  Pending Prescriptions Disp Refills  . sertraline (ZOLOFT) 100 MG tablet [Pharmacy Med Name: SERTRALINE HCL TABS 100MG ] 90 tablet 1    Sig: TAKE 1 TABLET DAILY     Psychiatry:  Antidepressants - SSRI Passed - 05/07/2019 12:24 AM      Passed - Completed PHQ-2 or PHQ-9 in the last 360 days.      Passed - Valid encounter within last 6 months    Recent Outpatient Visits          1 week ago Fever, unspecified fever cause   [place], [place], [place]   4 months ago Type 2 diabetes mellitus without complication, without long-term current use of insulin [facility])   [facility] Jerrol Banana., MD   8 months ago Right sided abdominal pain   [place], [place]   9 months ago Abdominal pain, unspecified abdominal location   [facility] Jerrol Banana., MD   9 months ago Type 2 diabetes mellitus without complication, without long-term current use of insulin [facility])   [facility] Jerrol Banana., MD

## 2019-05-13 DIAGNOSIS — H00015 Hordeolum externum left lower eyelid: Secondary | ICD-10-CM | POA: Diagnosis not present

## 2019-05-20 NOTE — Progress Notes (Deleted)
Complete physical exam   Patient: Nicole Young   DOB: [birth year]   79 y.o. Female  MRN: [id number] Visit Date: 05/22/2019  Today's healthcare provider: [name], MD   No chief complaint on file.  Subjective    PARA ASTI is a 79 y.o. female who presents today for a complete physical exam.  She reports consuming a {diet types:17450} diet. {Exercise:19826} She generally feels {well/fairly well/poorly:18703}. She reports sleeping {well/fairly well/poorly:18703}. She {does/does not:200015} have additional problems to discuss today.  HPI  Patient had AWV with [facility] 02/03/2019.  Past Medical History:  Diagnosis Date  . AMD (acid maltase deficiency) ([place])   . Complication of anesthesia    slow to wake up in past  . Depression    controlled  . Diabetes mellitus without complication ([place])    controlled  . Hypothyroidism   . Neuropathy   . Osteoarthritis    knee, ankles, shoulders, back, neck, hands/fingers, wrists;   . Rectocele   . Thyroid disease   . 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[facility])    Past Surgical History:  Procedure Laterality Date  . AUGMENTATION MAMMAPLASTY  1975  . AUGMENTATION MAMMAPLASTY  2017   2nd augmentation and lift  . BREAST SURGERY     impants replaced with lift  . CARPAL TUNNEL RELEASE Right 08/26/2014   Procedure: CARPAL TUNNEL RELEASE;  Surgeon: Earnestine Leys, MD;  Location: [facility] ORS;  Service: Orthopedics;  Laterality: Right;  . COLONOSCOPY  2005   Dr Bary Castilla  . COLONOSCOPY N/A 06/24/2014   Procedure: COLONOSCOPY;  Surgeon: Robert Bellow, MD;  Location: [facility] ENDOSCOPY;  Service: Endoscopy;  Laterality: N/A;  . PARATHYROIDECTOMY  1992  . PLACEMENT OF BREAST IMPLANTS  1975  . TONSILECTOMY, ADENOIDECTOMY, BILATERAL MYRINGOTOMY AND TUBES     Social History   Socioeconomic History  . Marital status: Widowed    Spouse name: Not on file  . Number of children: 3  . Years of education: Not on file  . Highest education level:  Bachelor's degree (e.g., BA, AB, BS)  Occupational History  . Occupation: retired  Tobacco Use  . Smoking status: Never Smoker  . Smokeless tobacco: Never Used  Substance and Sexual Activity  . Alcohol use: Yes    Alcohol/week: 2.0 - 3.0 standard drinks    Types: 2 - 3 Glasses of wine per week  . Drug use: No  . Sexual activity: Not on file  Other Topics Concern  . Not on file  Social History Narrative  . Not on file   Social Determinants of Health   Financial Resource Strain: Low Risk   . Difficulty of Paying Living Expenses: Not hard at all  Food Insecurity: No Food Insecurity  . Worried About Charity fundraiser in the Last Year: Never true  . Ran Out of Food in the Last Year: Never true  Transportation Needs: No Transportation Needs  . Lack of Transportation (Medical): No  . Lack of Transportation (Non-Medical): No  Physical Activity: Sufficiently Active  . Days of Exercise per Week: 6 days  . Minutes of Exercise per Session: 40 min  Stress: No Stress Concern Present  . Feeling of Stress : Not at all  Social Connections: Moderately Isolated  . Frequency of Communication with Friends and Family: More than three times a week  . Frequency of Social Gatherings with Friends and Family: More than three times a week  . Attends Religious Services: Never  . Active  Member of Clubs or Organizations: No  . Attends Archivist Meetings: Never  . Marital Status: Widowed  Intimate Partner Violence: Not At Risk  . Fear of Current or Ex-Partner: No  . Emotionally Abused: No  . Physically Abused: No  . Sexually Abused: No   Family Status  Relation Name Status  . Father  Deceased at age 38  . Mother  Deceased at age 78  . Brother  Alive  . MGF  Deceased  . Annamarie Major  (Not Specified)  . MGM  (Not Specified)  . PGM  (Not Specified)  . PGF  (Not Specified)  . Mat Aunt  (Not Specified)   Family History  Problem Relation Age of Onset  . Cancer Father        prostate    . Prostate cancer Father   . Hypertension Mother   . Heart attack Mother   . Heart attack Maternal Grandfather   . Diabetes Paternal Uncle   . Congestive Heart Failure Maternal Grandmother   . CVA Paternal Grandmother   . Prostate cancer Paternal Grandfather   . Breast cancer Maternal Aunt    Allergies  Allergen Reactions  . Neomycin-Polymyxin-Gramicidin Other (See Comments)    Other Reaction: Other reaction  . Wilder Glade [Dapagliflozin] Other (See Comments)    Weak and dizzy   . Neosporin [Neomycin-Bacitracin Zn-Polymyx] Swelling  . Sulfa Antibiotics   . Thimerosal Swelling  . Zostavax  [Zoster Vaccine Live] Rash    Do Not Administer Zostavax to Pt!    Patient Care Team: Jerrol Banana., MD as PCP - General (Family Medicine) Birder Robson, MD as Referring Physician (Ophthalmology) Garrel Ridgel, DPM as Consulting Physician (Podiatry) Solum, Betsey Holiday, MD as Physician Assistant (Endocrinology) Gae Dry, MD as Referring Physician (Obstetrics and Gynecology) Isaias Sakai, MD as Referring Physician (Ophthalmology)   Medications: Outpatient Medications Prior to Visit  Medication Sig  . Accu-Chek FastClix Lancets MISC CHECK SUGAR ONCE DAILY. DX: E11.9  . Calcium-Vitamin D-Vitamin K (CALCIUM SOFT CHEWS PO) Take by mouth daily.   . cholecalciferol (VITAMIN D3) 25 MCG (1000 UT) tablet Take by mouth daily. Unsure of dose  . diazepam (VALIUM) 5 MG tablet TAKE ONE TABLET BY MOUTH NIGHTLY AT BEDTIME AS NEEDED  . erythromycin ophthalmic ointment Apply 1/4 a small amount into left eye three times a day  Apply 1 a small amount in affected eye 3 times a day for 3 days  . glucose blood (ACCU-CHEK SMARTVIEW) test strip 1 each by Other route as needed for other. Use as instructed  . ketorolac (ACULAR) 0.5 % ophthalmic solution Instill 1 drop into left eye four times a day  1 gtt qid operative eye for 5 days  . levothyroxine (SYNTHROID) 75 MCG tablet TAKE 1 TABLET DAILY  . Magnesium  250 MG TABS Take 1 tablet by mouth daily.  . meloxicam (MOBIC) 15 MG tablet Take 15 mg by mouth daily.  . metFORMIN (GLUCOPHAGE-XR) 500 MG 24 hr tablet Take 1 tablet (500 mg total) by mouth 2 (two) times daily. 1 po q am and 2 po q pm (Patient taking differently: Take 1,000 mg by mouth daily with breakfast. )  . Multiple Vitamin (MULTIVITAMIN) capsule Take 1 capsule by mouth daily.  . Multiple Vitamins-Minerals (PRESERVISION AREDS PO) Take by mouth 2 (two) times daily.  . pantoprazole (PROTONIX) 20 MG tablet TAKE 1 TABLET DAILY (Patient not taking: Reported on 04/28/2019)  . rosuvastatin (CRESTOR) 10 MG tablet TAKE  1 TABLET AT BEDTIME  . sertraline (ZOLOFT) 100 MG tablet TAKE 1 TABLET DAILY   No facility-administered medications prior to visit.    Review of Systems  {Show previous labs (optional):23779::" "}  Objective    There were no vitals taken for this visit. {Show previous vital signs (optional):23777::" "}  Physical Exam  ***  Depression Screen  PHQ 2/9 Scores 02/03/2019 12/11/2018 03/19/2018  PHQ - 2 Score 0 0 0  PHQ- 9 Score - 0 -  Exception Documentation - - Patient refusal    No results found for any visits on 05/22/19.  Assessment & Plan    Routine Health Maintenance and Physical Exam  Exercise Activities and Dietary recommendations Goals    . DIET - INCREASE WATER INTAKE     Continue drinking 40 oz of water a day and increase when able to.        Immunization History  Administered Date(s) Administered  . Influenza, High Dose Seasonal PF 10/17/2016, 11/10/2017, 10/14/2018  . Influenza-Unspecified 10/23/2014, 10/14/2018  . PFIZER SARS-COV-2 Vaccination 01/14/2019, 02/04/2019  . Pneumococcal Conjugate-13 12/16/2013  . Pneumococcal Polysaccharide-23 12/23/2015  . Tdap 02/14/2013    Health Maintenance  Topic Date Due  . URINE MICROALBUMIN  10/17/2017  . HEMOGLOBIN A1C  06/11/2019  . INFLUENZA VACCINE  08/10/2019  . OPHTHALMOLOGY EXAM  10/16/2019  . FOOT  EXAM  12/04/2019  . TETANUS/TDAP  02/15/2023  . DEXA SCAN  10/14/2023  . COVID-19 Vaccine  Completed  . PNA vac Low Risk Adult  Completed    Discussed health benefits of physical activity, and encouraged her to engage in regular exercise appropriate for her age and condition.  ***  No follow-ups on file.     {provider attestation***:1}   [name], MD  [facility] [phone] (phone) [phone] (fax)  [place]

## 2019-05-21 NOTE — Progress Notes (Deleted)
Complete physical exam   Patient: Nicole Young   DOB: 06/05/1940   79 y.o. Female  MRN: [id number] Visit Date: 05/22/2019  Today's healthcare provider: [name], MD   Chief Complaint  Patient presents with  . Diabetes Mellitus  . Hyperlipidemia  . Annual Exam   Subjective    Nicole Young is a 79 y.o. female who presents today for a complete physical exam.  She reports consuming a general, low fat, low sodium and low protein diet. Home exercise routine includes walking. She generally feels well. She reports sleeping well. She does not have additional problems to discuss today.  HPI   Patient had AWV with [facility] 02/03/2019.  Diabetes Mellitus Type II, follow-up  Lab Results  Component Value Date   HGBA1C 6.3 (A) 12/11/2018   HGBA1C 6.6 (H) 07/24/2018   HGBA1C 7.3 (A) 03/19/2018   Last seen for diabetes 5 months ago.  Management since then includes continuing the same treatment. She reports excellent compliance with treatment. She is not having side effects.  Home blood sugar records: fasting range: 99  Episodes of hypoglycemia? No    Current insulin regiment: none Most Recent Eye Exam: Oct 2020  ---------------------------------------------------------------------------------------------------  Lipid/Cholesterol, follow-up  Last Lipid Panel: Lab Results  Component Value Date   CHOL 133 07/24/2018   LDLCALC 55 07/24/2018   HDL 51 07/24/2018   TRIG 134 07/24/2018    She was last seen for this 07/24/2018.  Management since that visit includes; labs checked showing-normal. She reports excellent compliance with treatment. She is not having side effects.  She is following a Regular, Low fat, Low Sodium diet. Current exercise: walking  Past Medical History:  Diagnosis Date  . 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 Complication of anesthesia    slow to wake up in past  . Depression    controlled  . Diabetes mellitus without complication  ([place])    controlled  . Hypothyroidism   . Neuropathy   . Osteoarthritis    knee, ankles, shoulders, back, neck, hands/fingers, wrists;   . Rectocele   . Thyroid disease   . 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[facility])    Past Surgical History:  Procedure Laterality Date  . AUGMENTATION MAMMAPLASTY  1975  . AUGMENTATION MAMMAPLASTY  2017   2nd augmentation and lift  . BREAST SURGERY     impants replaced with lift  . CARPAL TUNNEL RELEASE Right 08/26/2014   Procedure: CARPAL TUNNEL RELEASE;  Surgeon: Earnestine Leys, MD;  Location: [facility] ORS;  Service: Orthopedics;  Laterality: Right;  . COLONOSCOPY  2005   Dr Bary Castilla  . COLONOSCOPY N/A 06/24/2014   Procedure: COLONOSCOPY;  Surgeon: Robert Bellow, MD;  Location: [facility] ENDOSCOPY;  Service: Endoscopy;  Laterality: N/A;  . PARATHYROIDECTOMY  1992  . PLACEMENT OF BREAST IMPLANTS  1975  . TONSILECTOMY, ADENOIDECTOMY, BILATERAL MYRINGOTOMY AND TUBES     Social History   Socioeconomic History  . Marital status: Widowed    Spouse name: Not on file  . Number of children: 3  . Years of education: Not on file  . Highest education level: Bachelor's degree (e.g., BA, AB, BS)  Occupational History  . Occupation: retired  Tobacco Use  . Smoking status: Never Smoker  . Smokeless tobacco: Never Used  Substance and Sexual Activity  . Alcohol use: Yes    Alcohol/week: 2.0 - 3.0 standard drinks    Types: 2 - 3 Glasses of wine per week  .  Drug use: No  . Sexual activity: Not on file  Other Topics Concern  . Not on file  Social History Narrative  . Not on file   Social Determinants of Health   Financial Resource Strain: Low Risk   . Difficulty of Paying Living Expenses: Not Young at all  Food Insecurity: No Food Insecurity  . Worried About Charity fundraiser in the Last Year: Never true  . Ran Out of Food in the Last Year: Never true  Transportation Needs: No Transportation Needs  . Lack of Transportation (Medical): No  . Lack of  Transportation (Non-Medical): No  Physical Activity: Sufficiently Active  . Days of Exercise per Week: 6 days  . Minutes of Exercise per Session: 40 min  Stress: No Stress Concern Present  . Feeling of Stress : Not at all  Social Connections: Moderately Isolated  . Frequency of Communication with Friends and Family: More than three times a week  . Frequency of Social Gatherings with Friends and Family: More than three times a week  . Attends Religious Services: Never  . Active Member of Clubs or Organizations: No  . Attends Archivist Meetings: Never  . Marital Status: Widowed  Intimate Partner Violence: Not At Risk  . Fear of Current or Ex-Partner: No  . Emotionally Abused: No  . Physically Abused: No  . Sexually Abused: No   Family Status  Relation Name Status  . Father  Deceased at age 61  . Mother  Deceased at age 55  . Brother  Alive  . MGF  Deceased  . Annamarie Major  (Not Specified)  . MGM  (Not Specified)  . PGM  (Not Specified)  . PGF  (Not Specified)  . Mat Aunt  (Not Specified)   Family History  Problem Relation Age of Onset  . Cancer Father        prostate  . Prostate cancer Father   . Hypertension Mother   . Heart attack Mother   . Heart attack Maternal Grandfather   . Diabetes Paternal Uncle   . Congestive Heart Failure Maternal Grandmother   . CVA Paternal Grandmother   . Prostate cancer Paternal Grandfather   . Breast cancer Maternal Aunt    Allergies  Allergen Reactions  . Neomycin-Polymyxin-Gramicidin Other (See Comments)    Other Reaction: Other reaction  . Wilder Glade [Dapagliflozin] Other (See Comments)    Weak and dizzy   . Neosporin [Neomycin-Bacitracin Zn-Polymyx] Swelling  . Sulfa Antibiotics   . Thimerosal Swelling  . Zostavax  [Zoster Vaccine Live] Rash    Do Not Administer Zostavax to Pt!    Patient Care Team: Jerrol Banana., MD as PCP - General (Family Medicine) Birder Robson, MD as Referring Physician  (Ophthalmology) Garrel Ridgel, DPM as Consulting Physician (Podiatry) Solum, Betsey Holiday, MD as Physician Assistant (Endocrinology) Gae Dry, MD as Referring Physician (Obstetrics and Gynecology) Isaias Sakai, MD as Referring Physician (Ophthalmology)   Medications: Outpatient Medications Prior to Visit  Medication Sig  . Accu-Chek FastClix Lancets MISC CHECK SUGAR ONCE DAILY. DX: E11.9  . Calcium-Vitamin D-Vitamin K (CALCIUM SOFT CHEWS PO) Take by mouth daily.   . cholecalciferol (VITAMIN D3) 25 MCG (1000 UT) tablet Take by mouth daily. Unsure of dose  . diazepam (VALIUM) 5 MG tablet TAKE ONE TABLET BY MOUTH NIGHTLY AT BEDTIME AS NEEDED  . erythromycin ophthalmic ointment Apply 1/4 a small amount into left eye three times a day  Apply 1 a small  amount in affected eye 3 times a day for 3 days  . glucose blood (ACCU-CHEK SMARTVIEW) test strip 1 each by Other route as needed for other. Use as instructed  . levothyroxine (SYNTHROID) 75 MCG tablet TAKE 1 TABLET DAILY  . Magnesium 250 MG TABS Take 1 tablet by mouth daily.  . meloxicam (MOBIC) 15 MG tablet Take 15 mg by mouth daily.  . metFORMIN (GLUCOPHAGE-XR) 500 MG 24 hr tablet Take 1 tablet (500 mg total) by mouth 2 (two) times daily. 1 po q am and 2 po q pm (Patient taking differently: Take 1,000 mg by mouth daily with breakfast. )  . Multiple Vitamin (MULTIVITAMIN) capsule Take 1 capsule by mouth daily.  . Multiple Vitamins-Minerals (PRESERVISION AREDS PO) Take by mouth 2 (two) times daily.  . pantoprazole (PROTONIX) 20 MG tablet TAKE 1 TABLET DAILY  . rosuvastatin (CRESTOR) 10 MG tablet TAKE 1 TABLET AT BEDTIME  . sertraline (ZOLOFT) 100 MG tablet TAKE 1 TABLET DAILY  . ketorolac (ACULAR) 0.5 % ophthalmic solution Instill 1 drop into left eye four times a day  1 gtt qid operative eye for 5 days   No facility-administered medications prior to visit.    Review of Systems  {Show previous labs (optional):23779::" "}  Objective    BP  108/65 (BP Location: Right Arm, Patient Position: Sitting, Cuff Size: Normal)   Pulse 76   Temp (!) 97.1 F (36.2 C) (Temporal)   Ht 5' (1.524 m)   Wt 130 lb 12.8 oz (59.3 kg)   BMI 25.55 kg/m  {Show previous vital signs (optional):23777::" "}  Physical Exam  ***  Depression Screen  PHQ 2/9 Scores 02/03/2019 12/11/2018 03/19/2018  PHQ - 2 Score 0 0 0  PHQ- 9 Score - 0 -  Exception Documentation - - Patient refusal    No results found for any visits on 05/22/19.  Assessment & Plan    Routine Health Maintenance and Physical Exam  Exercise Activities and Dietary recommendations Goals    . DIET - INCREASE WATER INTAKE     Continue drinking 40 oz of water a day and increase when able to.        Immunization History  Administered Date(s) Administered  . Influenza, High Dose Seasonal PF 10/17/2016, 11/10/2017, 10/14/2018  . Influenza-Unspecified 10/23/2014, 10/14/2018  . PFIZER SARS-COV-2 Vaccination 01/14/2019, 02/04/2019  . Pneumococcal Conjugate-13 12/16/2013  . Pneumococcal Polysaccharide-23 12/23/2015  . Tdap 02/14/2013    Health Maintenance  Topic Date Due  . URINE MICROALBUMIN  10/17/2017  . HEMOGLOBIN A1C  06/11/2019  . INFLUENZA VACCINE  08/10/2019  . OPHTHALMOLOGY EXAM  10/16/2019  . FOOT EXAM  12/04/2019  . TETANUS/TDAP  02/15/2023  . DEXA SCAN  10/14/2023  . COVID-19 Vaccine  Completed  . PNA vac Low Risk Adult  Completed    Discussed health benefits of physical activity, and encouraged her to engage in regular exercise appropriate for her age and condition.  ***  No follow-ups on file.     {provider attestation***:1}   [name], MD  [facility] [phone] (phone) [phone] (fax)  [place]

## 2019-05-22 ENCOUNTER — Other Ambulatory Visit: Payer: Self-pay

## 2019-05-22 ENCOUNTER — Encounter: Payer: Self-pay | Admitting: Family Medicine

## 2019-05-22 ENCOUNTER — Ambulatory Visit (INDEPENDENT_AMBULATORY_CARE_PROVIDER_SITE_OTHER): Payer: Medicare Other | Admitting: Family Medicine

## 2019-05-22 VITALS — BP 108/65 | HR 76 | Temp 97.1°F | Ht 60.0 in | Wt 130.8 lb

## 2019-05-22 DIAGNOSIS — K219 Gastro-esophageal reflux disease without esophagitis: Secondary | ICD-10-CM

## 2019-05-22 DIAGNOSIS — E119 Type 2 diabetes mellitus without complications: Secondary | ICD-10-CM

## 2019-05-22 DIAGNOSIS — E039 Hypothyroidism, unspecified: Secondary | ICD-10-CM

## 2019-05-22 DIAGNOSIS — R350 Frequency of micturition: Secondary | ICD-10-CM

## 2019-05-22 DIAGNOSIS — E7849 Other hyperlipidemia: Secondary | ICD-10-CM | POA: Diagnosis not present

## 2019-05-22 DIAGNOSIS — F320 Major depressive disorder, single episode, mild: Secondary | ICD-10-CM

## 2019-05-22 LAB — POCT URINALYSIS DIPSTICK
Bilirubin, UA: NEGATIVE
Blood, UA: NEGATIVE
Glucose, UA: NEGATIVE
Ketones, UA: NEGATIVE
Nitrite, UA: NEGATIVE
Protein, UA: NEGATIVE
Spec Grav, UA: 1.01 (ref 1.010–1.025)
Urobilinogen, UA: 0.2 E.U./dL
pH, UA: 7 (ref 5.0–8.0)

## 2019-05-22 LAB — POCT GLYCOSYLATED HEMOGLOBIN (HGB A1C)
Estimated Average Glucose: 128
Hemoglobin A1C: 6.1 % — AB (ref 4.0–5.6)

## 2019-05-22 NOTE — Progress Notes (Signed)
Trena Platt Cummings,acting as a scribe for [name], MD.,have documented all relevant documentation on the behalf of [name], MD,as directed by  [name], MD while in the presence of [name], MD.  Established patient visit   Patient: Nicole Young   DOB: [birth year]   79 y.o. Female  MRN: [id number] Visit Date: 05/22/2019  Today's healthcare provider: [name], MD   Chief Complaint  Patient presents with  . Diabetes Mellitus  . Hyperlipidemia   Subjective    HPI  Patient had AWV with [facility] 02/03/2019. Overall she is feeling great.  She has lost 1718 pounds following an Optiva diet.  She says she feels the best she has in years. Diabetes Mellitus Type II, follow-up  Lab Results  Component Value Date   HGBA1C 6.3 (A) 12/11/2018   HGBA1C 6.6 (H) 07/24/2018   HGBA1C 7.3 (A) 03/19/2018   Last seen for diabetes 5 months ago.  Management since then includes continuing the same treatment. She reports excellent compliance with treatment. She is not having side effects.  Home blood sugar records: fasting range: 99  Episodes of hypoglycemia? No    Current insulin regiment: none Most Recent Eye Exam: Oct 2020  ---------------------------------------------------------------------------------------------------  Lipid/Cholesterol, follow-up  Last Lipid Panel: Lab Results  Component Value Date   CHOL 133 07/24/2018   LDLCALC 55 07/24/2018   HDL 51 07/24/2018   TRIG 134 07/24/2018    She was last seen for this 07/24/2018.  Management since that visit includes; labs checked showing-normal. She reports excellent compliance with treatment. She is not having side effects.  She is following a Regular, Low fat, Low Sodium diet. Current exercise: walking  Past Medical History:  Diagnosis Date  . AMD (acid maltase deficiency) ([place])   . Complication of anesthesia    slow to wake up in past  . Depression    controlled  .  Diabetes mellitus without complication ([place])    controlled  . Hypothyroidism   . Neuropathy   . Osteoarthritis    knee, ankles, shoulders, back, neck, hands/fingers, wrists;   . Rectocele   . Thyroid disease   . 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HCC)        Medications: Outpatient Medications Prior to Visit  Medication Sig  . Accu-Chek FastClix Lancets MISC CHECK SUGAR ONCE DAILY. DX: E11.9  . Calcium-Vitamin D-Vitamin K (CALCIUM SOFT CHEWS PO) Take by mouth daily.   . cholecalciferol (VITAMIN D3) 25 MCG (1000 UT) tablet Take by mouth daily. Unsure of dose  . diazepam (VALIUM) 5 MG tablet TAKE ONE TABLET BY MOUTH NIGHTLY AT BEDTIME AS NEEDED  . erythromycin ophthalmic ointment Apply 1/4 a small amount into left eye three times a day  Apply 1 a small amount in affected eye 3 times a day for 3 days  . glucose blood (ACCU-CHEK SMARTVIEW) test strip 1 each by Other route as needed for other. Use as instructed  . levothyroxine (SYNTHROID) 75 MCG tablet TAKE 1 TABLET DAILY  . Magnesium 250 MG TABS Take 1 tablet by mouth daily.  . meloxicam (MOBIC) 15 MG tablet Take 15 mg by mouth daily.  . metFORMIN (GLUCOPHAGE-XR) 500 MG 24 hr tablet Take 1 tablet (500 mg total) by mouth 2 (two) times daily. 1 po q am and 2 po q pm (Patient taking differently: Take 1,000 mg by mouth daily with breakfast. )  . Multiple Vitamin (MULTIVITAMIN) capsule Take 1 capsule by mouth daily.  Marland Kitchen  Multiple Vitamins-Minerals (PRESERVISION AREDS PO) Take by mouth 2 (two) times daily.  . pantoprazole (PROTONIX) 20 MG tablet TAKE 1 TABLET DAILY  . rosuvastatin (CRESTOR) 10 MG tablet TAKE 1 TABLET AT BEDTIME  . sertraline (ZOLOFT) 100 MG tablet TAKE 1 TABLET DAILY  . ketorolac (ACULAR) 0.5 % ophthalmic solution Instill 1 drop into left eye four times a day  1 gtt qid operative eye for 5 days   No facility-administered medications prior to visit.    Review of Systems  Constitutional: Negative for appetite change, chills,  fatigue and fever.  HENT: Negative.   Eyes: Negative.   Respiratory: Negative for chest tightness and shortness of breath.   Cardiovascular: Negative for chest pain and palpitations.  Gastrointestinal: Negative for abdominal pain, nausea and vomiting.  Endocrine: Negative.   Genitourinary: Negative.   Musculoskeletal: Negative.   Allergic/Immunologic: Negative.   Neurological: Negative for dizziness and weakness.  Hematological: Negative.   Psychiatric/Behavioral: Negative.     Last hemoglobin A1c Lab Results  Component Value Date   HGBA1C 6.1 (A) 05/22/2019      Objective    BP 108/65 (BP Location: Right Arm, Patient Position: Sitting, Cuff Size: Normal)   Pulse 76   Temp (!) 97.1 F (36.2 C) (Temporal)   Ht 5' (1.524 m)   Wt 130 lb 12.8 oz (59.3 kg)   BMI 25.55 kg/m  Wt Readings from Last 3 Encounters:  05/22/19 130 lb 12.8 oz (59.3 kg)  12/11/18 140 lb 12.8 oz (63.9 kg)  08/16/18 143 lb (64.9 kg)      Physical Exam Vitals reviewed.  Constitutional:      Appearance: She is well-developed.  HENT:     Head: Normocephalic and atraumatic.     Right Ear: External ear normal.     Left Ear: External ear normal.     Nose: Nose normal.  Eyes:     General: No scleral icterus.    Conjunctiva/sclera: Conjunctivae normal.  Neck:     Thyroid: No thyromegaly.  Cardiovascular:     Rate and Rhythm: Normal rate and regular rhythm.     Heart sounds: Normal heart sounds.  Pulmonary:     Effort: Pulmonary effort is normal.     Breath sounds: Normal breath sounds.  Abdominal:     Palpations: Abdomen is soft.     Tenderness: There is no guarding or rebound. Negative signs include Murphy's sign and McBurney's sign.  Musculoskeletal:     Right lower leg: No edema.     Left lower leg: No edema.  Skin:    General: Skin is warm and dry.  Neurological:     General: No focal deficit present.     Mental Status: She is alert and oriented to person, place, and time.      Comments: Normal sensory foot exam  Psychiatric:        Mood and Affect: Mood normal.        Behavior: Behavior normal.        Thought Content: Thought content normal.        Judgment: Judgment normal.       Results for orders placed or performed in visit on 05/22/19  POCT HgB A1C  Result Value Ref Range   Hemoglobin A1C 6.1 (A) 4.0 - 5.6 %   Estimated Average Glucose 128   POCT urinalysis dipstick  Result Value Ref Range   Color, UA     Clarity, UA     Glucose, UA Negative  Negative   Bilirubin, UA negative    Ketones, UA negative    Spec Grav, UA 1.010 1.010 - 1.025   Blood, UA negative    pH, UA 7.0 5.0 - 8.0   Protein, UA Negative Negative   Urobilinogen, UA 0.2 0.2 or 1.0 E.U./dL   Nitrite, UA negative    Leukocytes, UA Large (3+) (A) Negative   Appearance     Odor      Assessment & Plan     1. Type 2 diabetes mellitus without complication, without long-term current use of insulin (HCC) Probably excellent control with 17 pound weight loss. - POCT HgB A1C - POCT urinalysis dipstick - TSH - Lipid panel - CBC with Differential/Platelet - Comprehensive metabolic panel  2. Other hyperlipidemia On Crestor - POCT urinalysis dipstick - TSH - Lipid panel - CBC with Differential/Platelet - Comprehensive metabolic panel  3. Adult hypothyroidism  - POCT urinalysis dipstick - TSH - Lipid panel - CBC with Differential/Platelet - Comprehensive metabolic panel  4. Gastroesophageal reflux disease, unspecified whether esophagitis present Be careful with use of meloxicam.  On pantoprazole - TSH - Lipid panel - CBC with Differential/Platelet - Comprehensive metabolic panel  5. Mild major depression (HCC) On sertraline.  Consider coming off of this in the next year. - TSH - Lipid panel - CBC with Differential/Platelet - Comprehensive metabolic panel  6. Frequent urination  - Urine Culture   No follow-ups on file.      I, [name], MD, have  reviewed all documentation for this visit. The documentation on 05/26/19 for the exam, diagnosis, procedures, and orders are all accurate and complete.    Ailey Wessling Cranford Mon, MD  [facility] [phone] (phone) [phone] (fax)  [place]

## 2019-05-24 LAB — URINE CULTURE

## 2019-06-02 DIAGNOSIS — F320 Major depressive disorder, single episode, mild: Secondary | ICD-10-CM | POA: Diagnosis not present

## 2019-06-02 DIAGNOSIS — E7849 Other hyperlipidemia: Secondary | ICD-10-CM | POA: Diagnosis not present

## 2019-06-02 DIAGNOSIS — E119 Type 2 diabetes mellitus without complications: Secondary | ICD-10-CM | POA: Diagnosis not present

## 2019-06-02 DIAGNOSIS — E039 Hypothyroidism, unspecified: Secondary | ICD-10-CM | POA: Diagnosis not present

## 2019-06-02 DIAGNOSIS — H353221 Exudative age-related macular degeneration, left eye, with active choroidal neovascularization: Secondary | ICD-10-CM | POA: Diagnosis not present

## 2019-06-02 DIAGNOSIS — K219 Gastro-esophageal reflux disease without esophagitis: Secondary | ICD-10-CM | POA: Diagnosis not present

## 2019-06-03 ENCOUNTER — Telehealth: Payer: Self-pay

## 2019-06-03 DIAGNOSIS — E039 Hypothyroidism, unspecified: Secondary | ICD-10-CM

## 2019-06-03 LAB — COMPREHENSIVE METABOLIC PANEL
ALT: 15 IU/L (ref 0–32)
AST: 19 IU/L (ref 0–40)
Albumin/Globulin Ratio: 2.3 — ABNORMAL HIGH (ref 1.2–2.2)
Albumin: 4.6 g/dL (ref 3.7–4.7)
Alkaline Phosphatase: 45 IU/L — ABNORMAL LOW (ref 48–121)
BUN/Creatinine Ratio: 31 — ABNORMAL HIGH (ref 12–28)
BUN: 21 mg/dL (ref 8–27)
Bilirubin Total: 0.3 mg/dL (ref 0.0–1.2)
CO2: 23 mmol/L (ref 20–29)
Calcium: 9.5 mg/dL (ref 8.7–10.3)
Chloride: 96 mmol/L (ref 96–106)
Creatinine, Ser: 0.67 mg/dL (ref 0.57–1.00)
GFR calc Af Amer: 96 mL/min/{1.73_m2} (ref >59)
GFR calc non Af Amer: 83 mL/min/{1.73_m2} (ref >59)
Globulin, Total: 2 g/dL (ref 1.5–4.5)
Glucose: 104 mg/dL — ABNORMAL HIGH (ref 65–99)
Potassium: 4.3 mmol/L (ref 3.5–5.2)
Sodium: 133 mmol/L — ABNORMAL LOW (ref 134–144)
Total Protein: 6.6 g/dL (ref 6.0–8.5)

## 2019-06-03 LAB — CBC WITH DIFFERENTIAL/PLATELET
Basophils Absolute: 0.1 10*3/uL (ref 0.0–0.2)
Basos: 1 %
EOS (ABSOLUTE): 0.2 10*3/uL (ref 0.0–0.4)
Eos: 2 %
Hematocrit: 38.5 % (ref 34.0–46.6)
Hemoglobin: 13.4 g/dL (ref 11.1–15.9)
Immature Grans (Abs): 0 10*3/uL (ref 0.0–0.1)
Immature Granulocytes: 0 %
Lymphocytes Absolute: 2.4 10*3/uL (ref 0.7–3.1)
Lymphs: 30 %
MCH: 30.5 pg (ref 26.6–33.0)
MCHC: 34.8 g/dL (ref 31.5–35.7)
MCV: 88 fL (ref 79–97)
Monocytes Absolute: 0.7 10*3/uL (ref 0.1–0.9)
Monocytes: 8 %
Neutrophils Absolute: 4.7 10*3/uL (ref 1.4–7.0)
Neutrophils: 59 %
Platelets: 235 10*3/uL (ref 150–450)
RBC: 4.39 x10E6/uL (ref 3.77–5.28)
RDW: 13.3 % (ref 11.7–15.4)
WBC: 8 10*3/uL (ref 3.4–10.8)

## 2019-06-03 LAB — TSH: TSH: 0.396 u[IU]/mL — ABNORMAL LOW (ref 0.450–4.500)

## 2019-06-03 LAB — LIPID PANEL
Chol/HDL Ratio: 2.4 ratio (ref 0.0–4.4)
Cholesterol, Total: 144 mg/dL (ref 100–199)
HDL: 60 mg/dL (ref >39)
LDL Chol Calc (NIH): 67 mg/dL (ref 0–99)
Triglycerides: 92 mg/dL (ref 0–149)
VLDL Cholesterol Cal: 17 mg/dL (ref 5–40)

## 2019-06-03 MED ORDER — LEVOTHYROXINE SODIUM 50 MCG PO TABS: 75.0000 ug | ORAL_TABLET | Freq: Every day | ORAL | 2 refills | Status: DC

## 2019-06-03 MED ORDER — LEVOTHYROXINE SODIUM 50 MCG PO TABS: 75.0000 ug | ORAL_TABLET | Freq: Every day | ORAL | 0 refills | Status: DC

## 2019-06-03 NOTE — Telephone Encounter (Signed)
Medication sent to pharmacy  

## 2019-06-03 NOTE — Telephone Encounter (Signed)
-----   Message from Jerrol Banana., MD sent at 06/03/2019 10:26 AM EDT ----- Thyroid slightly overtreated.  Would decrease Synthroid from 75 to 50 mcg daily.  Recheck 3 to 4 months

## 2019-06-03 NOTE — Telephone Encounter (Signed)
Patient given lab results through mychart and medication will be sent to pharmacy.

## 2019-06-03 NOTE — Telephone Encounter (Signed)
Copied from [place] [id number]. Topic: General - Inquiry >> Jun 03, 2019  2:29 PM Alease Frame wrote: Reason for CRM:Pt is requesting a call back from office regarding her lab work. Please advise

## 2019-06-11 ENCOUNTER — Encounter: Payer: Self-pay | Admitting: Family Medicine

## 2019-06-11 ENCOUNTER — Other Ambulatory Visit: Payer: Self-pay | Admitting: Family Medicine

## 2019-06-11 NOTE — Telephone Encounter (Signed)
Rx is not due for refill.

## 2019-06-11 NOTE — Telephone Encounter (Signed)
[facility] asking for refills on Sertraline 100 mg. #90

## 2019-06-17 ENCOUNTER — Encounter: Payer: Medicare Other | Admitting: Family Medicine

## 2019-06-23 ENCOUNTER — Other Ambulatory Visit: Payer: Self-pay | Admitting: Family Medicine

## 2019-06-23 DIAGNOSIS — F320 Major depressive disorder, single episode, mild: Secondary | ICD-10-CM

## 2019-06-23 DIAGNOSIS — E119 Type 2 diabetes mellitus without complications: Secondary | ICD-10-CM

## 2019-06-23 MED ORDER — SERTRALINE HCL 100 MG PO TABS: 100.0000 mg | ORAL_TABLET | Freq: Every day | ORAL | 1 refills | Status: DC

## 2019-06-23 MED ORDER — METFORMIN HCL ER 500 MG PO TB24: 500.0000 mg | ORAL_TABLET | Freq: Two times a day (BID) | ORAL | 1 refills | Status: DC

## 2019-06-23 NOTE — Addendum Note (Signed)
Addended by: Gerald Stabs on: 06/23/2019 08:43 AM   Modules accepted: Orders

## 2019-06-23 NOTE — Telephone Encounter (Signed)
Medication refill sent to pharmacy  

## 2019-06-23 NOTE — Telephone Encounter (Signed)
[facility] faxed refill request for the following medications:  sertraline (ZOLOFT) 100 MG tablet  metFORMIN (GLUCOPHAGE-XR) 500 MG 24 hr tablet    Please advise.

## 2019-06-26 ENCOUNTER — Other Ambulatory Visit: Payer: Self-pay | Admitting: Family Medicine

## 2019-06-26 DIAGNOSIS — E039 Hypothyroidism, unspecified: Secondary | ICD-10-CM

## 2019-06-27 ENCOUNTER — Telehealth: Payer: Self-pay | Admitting: Family Medicine

## 2019-06-27 DIAGNOSIS — E119 Type 2 diabetes mellitus without complications: Secondary | ICD-10-CM

## 2019-06-27 MED ORDER — METFORMIN HCL ER 500 MG PO TB24: 500.0000 mg | ORAL_TABLET | Freq: Two times a day (BID) | ORAL | 1 refills | Status: DC

## 2019-06-27 NOTE — Telephone Encounter (Signed)
[facility]  is calling and needs clarification on Metformin 500 mg reference number [id number]. Nicole Young said there is different directions on medication

## 2019-06-27 NOTE — Telephone Encounter (Signed)
Patient takes 2 tablets daily.

## 2019-08-07 ENCOUNTER — Ambulatory Visit: Payer: Medicare Other | Attending: Internal Medicine

## 2019-08-07 DIAGNOSIS — Z20822 Contact with and (suspected) exposure to covid-19: Secondary | ICD-10-CM

## 2019-08-08 ENCOUNTER — Telehealth (HOSPITAL_COMMUNITY): Payer: Self-pay | Admitting: Nurse Practitioner

## 2019-08-08 ENCOUNTER — Telehealth (INDEPENDENT_AMBULATORY_CARE_PROVIDER_SITE_OTHER): Payer: Medicare Other | Admitting: Physician Assistant

## 2019-08-08 ENCOUNTER — Other Ambulatory Visit: Payer: Self-pay

## 2019-08-08 ENCOUNTER — Encounter: Payer: Self-pay | Admitting: Physician Assistant

## 2019-08-08 ENCOUNTER — Other Ambulatory Visit (HOSPITAL_COMMUNITY): Payer: Self-pay | Admitting: Nurse Practitioner

## 2019-08-08 ENCOUNTER — Ambulatory Visit: Payer: Self-pay | Admitting: *Deleted

## 2019-08-08 VITALS — Temp 98.1°F

## 2019-08-08 DIAGNOSIS — U071 COVID-19: Secondary | ICD-10-CM | POA: Insufficient documentation

## 2019-08-08 LAB — SARS-COV-2, NAA 2 DAY TAT

## 2019-08-08 LAB — NOVEL CORONAVIRUS, NAA: SARS-CoV-2, NAA: DETECTED — AB

## 2019-08-08 MED ORDER — BENZONATATE 200 MG PO CAPS: 200.0000 mg | ORAL_CAPSULE | Freq: Three times a day (TID) | ORAL | 0 refills | Status: DC | PRN

## 2019-08-08 MED ORDER — SODIUM CHLORIDE 0.9 % IV SOLN
Freq: Once | INTRAVENOUS | Status: AC
  Filled 2019-08-08: qty 600

## 2019-08-08 NOTE — Telephone Encounter (Signed)
I connected by phone with Nicole Young on 08/08/2019 at 4:01 PM to discuss the potential use of an new treatment for mild to moderate COVID-19 viral infection in non-hospitalized patients.  This patient is a 79 y.o. female that meets the FDA criteria for Emergency Use Authorization of bamlanivimab or casirivimab\imdevimab.  Has a (+) direct SARS-CoV-2 viral test result  Has mild or moderate COVID-19   Is ? 80 years of age and weighs ? 40 kg  Is NOT hospitalized due to COVID-19  Is NOT requiring oxygen therapy or requiring an increase in baseline oxygen flow rate due to COVID-19  Is within 10 days of symptom onset  Has at least one of the high risk factor(s) for progression to severe COVID-19 and/or hospitalization as defined in EUA.  Specific high risk criteria : Older age (>/= 80 yo)   I have spoken and communicated the following to the patient or parent/caregiver:  1. FDA has authorized the emergency use of casirivimab\imdevimab for the treatment of mild to moderate COVID-19 in adults and pediatric patients with positive results of direct SARS-CoV-2 viral testing who are 40 years of age and older weighing at least 40 kg, and who are at high risk for progressing to severe COVID-19 and/or hospitalization.  2. The significant known and potential risks and benefits of casirivimab\imdevimab, and the extent to which such potential risks and benefits are unknown.  3. Information on available alternative treatments and the risks and benefits of those alternatives, including clinical trials.  4. Patients treated with casirivimab\imdevimab should continue to self-isolate and use infection control measures (e.g., wear mask, isolate, social distance, avoid sharing personal items, clean and disinfect "high touch" surfaces, and frequent handwashing) according to CDC guidelines.   5. The patient or parent/caregiver has the option to accept or refuse  casirivimab\imdevimab.  After reviewing  this information with the patient, The patient agreed to proceed with receiving casirivimab\imdevimab infusion and will be provided a copy of the Fact sheet prior to receiving the infusion.Beckey Rutter, [place], AGNP-C [phone] ([place])

## 2019-08-08 NOTE — Progress Notes (Addendum)
MyChart Video Visit    Virtual Visit via Video Note   This visit type was conducted due to national recommendations for restrictions regarding the COVID-19 Pandemic (e.g. social distancing) in an effort to limit this patient's exposure and mitigate transmission in our community. This patient is at least at moderate risk for complications without adequate follow up. This format is felt to be most appropriate for this patient at this time. Physical exam was limited by quality of the video and audio technology used for the visit.   I connected with Nicole Young on 08/14/19 at  2:20 PM EDT by a video enabled telemedicine application and verified that I am speaking with the correct person using two identifiers.  I discussed the limitations of evaluation and management by telemedicine and the availability of in person appointments. The patient expressed understanding and agreed to proceed.  Mar Daring, PA-C   Patient location: Home Provider location: [facility]   Patient: Nicole Young   DOB: [birth year]   79 y.o. Female  MRN: [id number] Visit Date: 08/08/2019  Today's healthcare provider: Mar Daring, PA-C   Chief Complaint  Patient presents with  . Covid Exposure   Subjective    HPI  Patient with c/o covid positive yesterday. She reports that on Sunday she started coughing and on Tuesday she had a low blood pressure of  61/36. She called EMS. She very congested and runny nose. Prior to this she had traveled out of town for a funeral and this is most likely where she was exposed. She has had her covid vaccinations.    Patient Active Problem List   Diagnosis Date Noted  . COVID-19 virus infection 08/08/2019  . Vaginal atrophy 08/16/2018  . Rectocele 08/16/2018  . Osteoarthritis of knee 10/10/2017  . Scoliosis of lumbar spine 07/06/2016  . Degeneration of lumbar intervertebral disc 07/06/2016  . Arthralgia of multiple joints 11/12/2014  . Allergic rhinitis  11/12/2014  . Diabetes mellitus ([place]) 11/12/2014  . Essential (primary) hypertension 11/12/2014  . Personal history of other drug therapy 11/12/2014  . HLD (hyperlipidemia) 11/12/2014  . Adult hypothyroidism 11/12/2014  . Mild major depression ([place]) 11/12/2014  . Overweight 11/12/2014  . Restless leg 11/12/2014  . Encounter for screening colonoscopy 04/17/2014  . Type 2 diabetes mellitus ([place]) 12/23/2013   Past Medical History:  Diagnosis Date  . 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 Complication of anesthesia    slow to wake up in past  . Depression    controlled  . Diabetes mellitus without complication ([place])    controlled  . Hypothyroidism   . Neuropathy   . Osteoarthritis    knee, ankles, shoulders, back, neck, hands/fingers, wrists;   . Rectocele   . Thyroid disease   . 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 Accu-Chek FastClix Lancets MISC CHECK SUGAR ONCE DAILY. DX: E11.9  . cholecalciferol (VITAMIN D3) 25 MCG (1000 UT) tablet Take by mouth daily. Unsure of dose  . diazepam (VALIUM) 5 MG tablet TAKE ONE TABLET BY MOUTH NIGHTLY AT BEDTIME AS NEEDED  . glucose blood (ACCU-CHEK SMARTVIEW) test strip 1 each by Other route as needed for other. Use as instructed  . levothyroxine (SYNTHROID) 50 MCG tablet TAKE 1.5 TABLETS (75 MCG TOTAL) BY MOUTH DAILY.  . Magnesium 250 MG TABS Take 1 tablet by mouth daily.  . metFORMIN (GLUCOPHAGE-XR) 500 MG 24  hr tablet Take 1 tablet (500 mg total) by mouth 2 (two) times daily.  . Multiple Vitamins-Minerals (PRESERVISION AREDS PO) Take by mouth 2 (two) times daily.  . rosuvastatin (CRESTOR) 10 MG tablet TAKE 1 TABLET AT BEDTIME  . sertraline (ZOLOFT) 100 MG tablet Take 1 tablet (100 mg total) by mouth daily.  . Calcium-Vitamin D-Vitamin K (CALCIUM SOFT CHEWS PO) Take by mouth daily.  (Patient not taking: Reported on 08/08/2019)  . erythromycin ophthalmic ointment  Apply 1/4 a small amount into left eye three times a day  Apply 1 a small amount in affected eye 3 times a day for 3 days  . ketorolac (ACULAR) 0.5 % ophthalmic solution Instill 1 drop into left eye four times a day  1 gtt qid operative eye for 5 days  . meloxicam (MOBIC) 15 MG tablet Take 15 mg by mouth daily. (Patient not taking: Reported on 08/08/2019)  . Multiple Vitamin (MULTIVITAMIN) capsule Take 1 capsule by mouth daily. (Patient not taking: Reported on 08/08/2019)  . pantoprazole (PROTONIX) 20 MG tablet TAKE 1 TABLET DAILY (Patient not taking: Reported on 08/08/2019)   No facility-administered medications prior to visit.    Review of Systems  Last CBC Lab Results  Component Value Date   WBC 8.0 06/02/2019   HGB 13.4 06/02/2019   HCT 38.5 06/02/2019   MCV 88 06/02/2019   MCH 30.5 06/02/2019   RDW 13.3 06/02/2019   PLT 235 95/63/8756   Last metabolic panel Lab Results  Component Value Date   GLUCOSE 104 (H) 06/02/2019   NA 133 (L) 06/02/2019   K 4.3 06/02/2019   CL 96 06/02/2019   CO2 23 06/02/2019   BUN 21 06/02/2019   CREATININE 0.67 06/02/2019   GFRNONAA 83 06/02/2019   GFRAA 96 06/02/2019   CALCIUM 9.5 06/02/2019   PROT 6.6 06/02/2019   ALBUMIN 4.6 06/02/2019   LABGLOB 2.0 06/02/2019   AGRATIO 2.3 (H) 06/02/2019   BILITOT 0.3 06/02/2019   ALKPHOS 45 (L) 06/02/2019   AST 19 06/02/2019   ALT 15 06/02/2019   ANIONGAP 8 07/28/2014      Objective    Temp 98.1 F (36.7 C) (Temporal)  BP Readings from Last 3 Encounters:  08/09/19 (!) 129/75  05/22/19 108/65  12/11/18 108/60   Wt Readings from Last 3 Encounters:  05/22/19 130 lb 12.8 oz (59.3 kg)  12/11/18 140 lb 12.8 oz (63.9 kg)  08/16/18 143 lb (64.9 kg)      Physical Exam Vitals reviewed.  Constitutional:      General: She is not in acute distress.    Appearance: Normal appearance. She is well-developed. She is ill-appearing.  HENT:     Head: Normocephalic and atraumatic.  Pulmonary:      Effort: Pulmonary effort is normal. No respiratory distress.  Musculoskeletal:     Cervical back: Normal range of motion and neck supple.  Neurological:     Mental Status: She is alert.  Psychiatric:        Mood and Affect: Mood normal.        Behavior: Behavior normal.        Thought Content: Thought content normal.        Judgment: Judgment normal.        Assessment & Plan     1. COVID-19 Discussed symptomatic management. Push fluids. Move around every few hours. Discussed isolation protocols. Will refer to MAB infusion therapy. Call if worsening.  - MyChart COVID-19 home monitoring program; Future -  Temperature monitoring; Future   No follow-ups on file.     I discussed the assessment and treatment plan with the patient. The patient was provided an opportunity to ask questions and all were answered. The patient agreed with the plan and demonstrated an understanding of the instructions.   The patient was advised to call back or seek an in-person evaluation if the symptoms worsen or if the condition fails to improve as anticipated.  I provided 15 minutes of non-face-to-face time during this encounter.  Reynolds Bowl, PA-C, have reviewed all documentation for this visit. The documentation on 08/13/19 for the exam, diagnosis, procedures, and orders are all accurate and complete.  [place] [facility] [phone] (phone) [phone] (fax)  [place]

## 2019-08-08 NOTE — Telephone Encounter (Signed)
COVID test yesterday- + COVID. Patient has had vaccine. Patient tested for screening, cough- recent air travel and funeral attendance. BP on Tuesday- 61/36- EMS came and evaluated her- BP returned to normal. Patient is requesting an appointment today- does not want triage.  Reason for Disposition . [1] HIGH RISK patient (e.g., age > 56 years, diabetes, heart or lung disease, weak immune system) AND [2] new or worsening symptoms    Patient is high risk- has had vaccine- symptoms are mild at this time- requesting virtual visit with provider.  Answer Assessment - Initial Assessment Questions 1. COVID-19 DIAGNOSIS: "Who made your Coronavirus (COVID-19) diagnosis?" "Was it confirmed by a positive lab test?" If not diagnosed by a HCP, ask "Are there lots of cases (community spread) where you live?" (See public health department website, if unsure)     Confirmed by + COVID test 2. COVID-19 EXPOSURE: "Was there any known exposure to COVID before the symptoms began?" CDC Definition of close contact: within 6 feet (2 meters) for a total of 15 minutes or more over a 24-hour period.      Unknown- patient has airplane travel and went to funeral 3. ONSET: "When did the COVID-19 symptoms start?"      Beginning of the week 4. WORST SYMPTOM: "What is your worst symptom?" (e.g., cough, fever, shortness of breath, muscle aches)     comgestion 5. COUGH: "Do you have a cough?" If Yes, ask: "How bad is the cough?"       Yes - cough is better 6. FEVER: "Do you have a fever?" If Yes, ask: "What is your temperature, how was it measured, and when did it start?"     no 7. RESPIRATORY STATUS: "Describe your breathing?" (e.g., shortness of breath, wheezing, unable to speak)      No SOB 8. BETTER-SAME-WORSE: "Are you getting better, staying the same or getting worse compared to yesterday?"  If getting worse, ask, "In what way?"     Not asked 9. HIGH RISK DISEASE: "Do you have any chronic medical problems?" (e.g.,  asthma, heart or lung disease, weak immune system, obesity, etc.)     yes 10. PREGNANCY: "Is there any chance you are pregnant?" "When was your last menstrual period?"       n/a 11. OTHER SYMPTOMS: "Do you have any other symptoms?"  (e.g., chills, fatigue, headache, loss of smell or taste, muscle pain, sore throat; new loss of smell or taste especially support the diagnosis of COVID-19)       BP drop this week- better now  Protocols used: CORONAVIRUS (COVID-19) DIAGNOSED OR SUSPECTED-A-AH

## 2019-08-09 ENCOUNTER — Ambulatory Visit (HOSPITAL_COMMUNITY)
Admission: RE | Admit: 2019-08-09 | Discharge: 2019-08-09 | Disposition: A | Discharge status: Home / Self Care | Payer: Medicare Other | Source: Ambulatory Visit | Attending: Pulmonary Disease | Admitting: Pulmonary Disease

## 2019-08-09 DIAGNOSIS — Z23 Encounter for immunization: Secondary | ICD-10-CM | POA: Diagnosis not present

## 2019-08-09 DIAGNOSIS — U071 COVID-19: Secondary | ICD-10-CM | POA: Diagnosis not present

## 2019-08-09 NOTE — Discharge Instructions (Signed)

## 2019-08-13 ENCOUNTER — Encounter: Payer: Self-pay | Admitting: Physician Assistant

## 2019-08-13 NOTE — Patient Instructions (Signed)
COVID-19 COVID-19 is a respiratory infection that is caused by a virus called severe acute respiratory syndrome coronavirus 2 (SARS-CoV-2). The disease is also known as coronavirus disease or novel coronavirus. In some people, the virus may not cause any symptoms. In others, it may cause a serious infection. The infection can get worse quickly and can lead to complications, such as:  Pneumonia, or infection of the lungs.  Acute respiratory distress syndrome or ARDS. This is a condition in which fluid build-up in the lungs prevents the lungs from filling with air and passing oxygen into the blood.  Acute respiratory failure. This is a condition in which there is not enough oxygen passing from the lungs to the body or when carbon dioxide is not passing from the lungs out of the body.  Sepsis or septic shock. This is a serious bodily reaction to an infection.  Blood clotting problems.  Secondary infections due to bacteria or fungus.  Organ failure. This is when your body's organs stop working. The virus that causes COVID-19 is contagious. This means that it can spread from person to person through droplets from coughs and sneezes (respiratory secretions). What are the causes? This illness is caused by a virus. You may catch the virus by:  Breathing in droplets from an infected person. Droplets can be spread by a person breathing, speaking, singing, coughing, or sneezing.  Touching something, like a table or a doorknob, that was exposed to the virus (contaminated) and then touching your mouth, nose, or eyes. What increases the risk? Risk for infection You are more likely to be infected with this virus if you:  Are within 6 feet (2 meters) of a person with COVID-19.  Provide care for or live with a person who is infected with COVID-19.  Spend time in crowded indoor spaces or live in shared housing. Risk for serious illness You are more likely to become seriously ill from the virus if you:   Are 50 years of age or older. The higher your age, the more you are at risk for serious illness.  Live in a nursing home or long-term care facility.  Have cancer.  Have a long-term (chronic) disease such as: ? Chronic lung disease, including chronic obstructive pulmonary disease or asthma. ? A long-term disease that lowers your body's ability to fight infection (immunocompromised). ? Heart disease, including heart failure, a condition in which the arteries that lead to the heart become narrow or blocked (coronary artery disease), a disease which makes the heart muscle thick, weak, or stiff (cardiomyopathy). ? Diabetes. ? Chronic kidney disease. ? Sickle cell disease, a condition in which red blood cells have an abnormal "sickle" shape. ? Liver disease.  Are obese. What are the signs or symptoms? Symptoms of this condition can range from mild to severe. Symptoms may appear any time from 2 to 14 days after being exposed to the virus. They include:  A fever or chills.  A cough.  Difficulty breathing.  Headaches, body aches, or muscle aches.  Runny or stuffy (congested) nose.  A sore throat.  New loss of taste or smell. Some people may also have stomach problems, such as nausea, vomiting, or diarrhea. Other people may not have any symptoms of COVID-19. How is this diagnosed? This condition may be diagnosed based on:  Your signs and symptoms, especially if: ? You live in an area with a COVID-19 outbreak. ? You recently traveled to or from an area where the virus is common. ? You   provide care for or live with a person who was diagnosed with COVID-19. ? You were exposed to a person who was diagnosed with COVID-19.  A physical exam.  Lab tests, which may include: ? Taking a sample of fluid from the back of your nose and throat (nasopharyngeal fluid), your nose, or your throat using a swab. ? A sample of mucus from your lungs (sputum). ? Blood tests.  Imaging tests, which  may include, X-rays, CT scan, or ultrasound. How is this treated? At present, there is no medicine to treat COVID-19. Medicines that treat other diseases are being used on a trial basis to see if they are effective against COVID-19. Your health care provider will talk with you about ways to treat your symptoms. For most people, the infection is mild and can be managed at home with rest, fluids, and over-the-counter medicines. Treatment for a serious infection usually takes places in a hospital intensive care unit (ICU). It may include one or more of the following treatments. These treatments are given until your symptoms improve.  Receiving fluids and medicines through an IV.  Supplemental oxygen. Extra oxygen is given through a tube in the nose, a face mask, or a hood.  Positioning you to lie on your stomach (prone position). This makes it easier for oxygen to get into the lungs.  Continuous positive airway pressure (CPAP) or bi-level positive airway pressure (BPAP) machine. This treatment uses mild air pressure to keep the airways open. A tube that is connected to a motor delivers oxygen to the body.  Ventilator. This treatment moves air into and out of the lungs by using a tube that is placed in your windpipe.  Tracheostomy. This is a procedure to create a hole in the neck so that a breathing tube can be inserted.  Extracorporeal membrane oxygenation (ECMO). This procedure gives the lungs a chance to recover by taking over the functions of the heart and lungs. It supplies oxygen to the body and removes carbon dioxide. Follow these instructions at home: Lifestyle  If you are sick, stay home except to get medical care. Your health care provider will tell you how long to stay home. Call your health care provider before you go for medical care.  Rest at home as told by your health care provider.  Do not use any products that contain nicotine or tobacco, such as cigarettes, e-cigarettes, and  chewing tobacco. If you need help quitting, ask your health care provider.  Return to your normal activities as told by your health care provider. Ask your health care provider what activities are safe for you. General instructions  Take over-the-counter and prescription medicines only as told by your health care provider.  Drink enough fluid to keep your urine pale yellow.  Keep all follow-up visits as told by your health care provider. This is important. How is this prevented?  There is no vaccine to help prevent COVID-19 infection. However, there are steps you can take to protect yourself and others from this virus. To protect yourself:   Do not travel to areas where COVID-19 is a risk. The areas where COVID-19 is reported change often. To identify high-risk areas and travel restrictions, check the CDC travel website: wwwnc.cdc.gov/travel/notices  If you live in, or must travel to, an area where COVID-19 is a risk, take precautions to avoid infection. ? Stay away from people who are sick. ? Wash your hands often with soap and water for 20 seconds. If soap and water   are not available, use an alcohol-based hand sanitizer. ? Avoid touching your mouth, face, eyes, or nose. ? Avoid going out in public, follow guidance from your state and local health authorities. ? If you must go out in public, wear a cloth face covering or face mask. Make sure your mask covers your nose and mouth. ? Avoid crowded indoor spaces. Stay at least 6 feet (2 meters) away from others. ? Disinfect objects and surfaces that are frequently touched every day. This may include:  Counters and tables.  Doorknobs and light switches.  Sinks and faucets.  Electronics, such as phones, remote controls, keyboards, computers, and tablets. To protect others: If you have symptoms of COVID-19, take steps to prevent the virus from spreading to others.  If you think you have a COVID-19 infection, contact your health care  provider right away. Tell your health care team that you think you may have a COVID-19 infection.  Stay home. Leave your house only to seek medical care. Do not use public transport.  Do not travel while you are sick.  Wash your hands often with soap and water for 20 seconds. If soap and water are not available, use alcohol-based hand sanitizer.  Stay away from other members of your household. Let healthy household members care for children and pets, if possible. If you have to care for children or pets, wash your hands often and wear a mask. If possible, stay in your own room, separate from others. Use a different bathroom.  Make sure that all people in your household wash their hands well and often.  Cough or sneeze into a tissue or your sleeve or elbow. Do not cough or sneeze into your hand or into the air.  Wear a cloth face covering or face mask. Make sure your mask covers your nose and mouth. Where to find more information  Centers for Disease Control and Prevention: www.cdc.gov/coronavirus/2019-ncov/index.html  World Health Organization: www.who.int/health-topics/coronavirus Contact a health care provider if:  You live in or have traveled to an area where COVID-19 is a risk and you have symptoms of the infection.  You have had contact with someone who has COVID-19 and you have symptoms of the infection. Get help right away if:  You have trouble breathing.  You have pain or pressure in your chest.  You have confusion.  You have bluish lips and fingernails.  You have difficulty waking from sleep.  You have symptoms that get worse. These symptoms may represent a serious problem that is an emergency. Do not wait to see if the symptoms will go away. Get medical help right away. Call your local emergency services (911 in the [place] Do not drive yourself to the hospital. Let the emergency medical personnel know if you think you have COVID-19. Summary  COVID-19 is a  respiratory infection that is caused by a virus. It is also known as coronavirus disease or novel coronavirus. It can cause serious infections, such as pneumonia, acute respiratory distress syndrome, acute respiratory failure, or sepsis.  The virus that causes COVID-19 is contagious. This means that it can spread from person to person through droplets from breathing, speaking, singing, coughing, or sneezing.  You are more likely to develop a serious illness if you are 50 years of age or older, have a weak immune system, live in a nursing home, or have chronic disease.  There is no medicine to treat COVID-19. Your health care provider will talk with you about ways to treat your symptoms.    Take steps to protect yourself and others from infection. Wash your hands often and disinfect objects and surfaces that are frequently touched every day. Stay away from people who are sick and wear a mask if you are sick. This information is not intended to replace advice given to you by your health care provider. Make sure you discuss any questions you have with your health care provider. Document Revised: 10/25/2018 Document Reviewed: 01/31/2018 Elsevier Patient Education  2020 [employer]  

## 2019-09-05 DIAGNOSIS — E119 Type 2 diabetes mellitus without complications: Secondary | ICD-10-CM | POA: Diagnosis not present

## 2019-09-05 DIAGNOSIS — E890 Postprocedural hypothyroidism: Secondary | ICD-10-CM | POA: Diagnosis not present

## 2019-09-08 DIAGNOSIS — H353221 Exudative age-related macular degeneration, left eye, with active choroidal neovascularization: Secondary | ICD-10-CM | POA: Diagnosis not present

## 2019-09-11 NOTE — Progress Notes (Deleted)
Established patient visit   Patient: Nicole Young   DOB: 1940-02-20   79 y.o. Female  MRN: [id number] Visit Date: 09/16/2019  Today's healthcare provider: [name], MD   No chief complaint on file.  Subjective    HPI  Diabetes Mellitus Type II, follow-up  Lab Results  Component Value Date   HGBA1C 6.1 (A) 05/22/2019   HGBA1C 6.3 (A) 12/11/2018   HGBA1C 6.6 (H) 07/24/2018   Last seen for diabetes 4 months ago.  Management since then includes. Probably excellent control with 17 pound weight loss. She reports {excellent/good/fair/poor:19665} compliance with treatment. She {is/is not:21021397} having side effects. {document side effects if present:1}  Home blood sugar records: {diabetes glucometry results:16657}  Episodes of hypoglycemia? {Yes/No:20286} {enter details if yes:1}   Current insulin regiment: {***Type 'None' if not taking insulin                                                otherwise enter complete                                                 details of insulin regiment:1} Most Recent Eye Exam: ***  ---------------------------------------------------------------------------------------------------  Last Lipid Panel: Lab Results  Component Value Date   CHOL 144 06/02/2019   LDLCALC 67 06/02/2019   HDL 60 06/02/2019   TRIG 92 06/02/2019    She was last seen for this 4 months ago.  Management since that visit includes; On Crestor. She reports {excellent/good/fair/poor:19665} compliance with treatment. She {is/is not:9024} having side effects. {document side effects if present:1} She is following a {diet:21022986} diet. Current exercise: {exercise GUYQI:34742}  Last metabolic panel Lab Results  Component Value Date   GLUCOSE 104 (H) 06/02/2019   NA 133 (L) 06/02/2019   K 4.3 06/02/2019   BUN 21 06/02/2019   CREATININE 0.67 06/02/2019   GFRNONAA 83 06/02/2019   GFRAA 96 06/02/2019   CALCIUM 9.5 06/02/2019   AST 19 06/02/2019    ALT 15 06/02/2019   The ASCVD Risk score (Goff DC Jr., et al., 2013) failed to calculate for the following reasons:   The 2013 ASCVD risk score is only valid for ages 90 to 64  --------------------------------------------------------------------------------------------------- Hypothyroid, follow-up  Lab Results  Component Value Date   TSH 0.396 (L) 06/02/2019   TSH 0.819 07/24/2018   TSH 0.621 07/17/2017   Wt Readings from Last 3 Encounters:  05/22/19 130 lb 12.8 oz (59.3 kg)  12/11/18 140 lb 12.8 oz (63.9 kg)  08/16/18 143 lb (64.9 kg)    She was last seen for hypothyroid 4 months ago.  Management since that visit includes; labs checked showing-Thyroid slightly overtreated. Would decrease Synthroid from 75 to 50 mcg daily. Recheck 3 to 4 months. She reports {excellent/good/fair/poor:19665} compliance with treatment. She {is/is not:21021397} having side effects. {document side effects if present:1}  Symptoms: {Yes/No:20286} change in energy level {Yes/No:20286} constipation  {Yes/No:20286} diarrhea {Yes/No:20286} heat / cold intolerance  {Yes/No:20286} nervousness {Yes/No:20286} palpitations  {Yes/No:20286} weight changes    -----------------------------------------------------------------------------------------  Depression, Follow-up  She  was last seen for this 4 months ago. Changes made at last visit include; On sertraline.  Consider coming off of this in the  next year.   She reports {excellent/good/fair/poor:19665} compliance with treatment. She {is/is not:21021397} having side effects. ***  She reports {DESC; GOOD/FAIR/POOR:18685} tolerance of treatment. Current symptoms include: {Symptoms; depression:1002} She feels she is {improved/worse/unchanged:3041574} since last visit.  Depression screen [facility] 2/9 02/03/2019 12/11/2018 03/19/2018  Decreased Interest 0 0 -  Down, Depressed, Hopeless 0 0 0  PHQ - 2 Score 0 0 0  Altered sleeping - 0 -  Tired, decreased  energy - 0 -  Change in appetite - 0 -  Feeling bad or failure about yourself  - 0 -  Trouble concentrating - 0 -  Moving slowly or fidgety/restless - 0 -  Suicidal thoughts - 0 -  PHQ-9 Score - 0 -  Difficult doing work/chores - Not difficult at all -    -----------------------------------------------------------------------------------------  Gastroesophageal reflux disease, unspecified whether esophagitis present From 05/22/2019-Be careful with use of meloxicam.  On pantoprazole.   {Show patient history (optional):23778::" "}   Medications: Outpatient Medications Prior to Visit  Medication Sig  . Accu-Chek FastClix Lancets MISC CHECK SUGAR ONCE DAILY. DX: E11.9  . benzonatate (TESSALON) 200 MG capsule Take 1 capsule (200 mg total) by mouth 3 (three) times daily as needed.  . Calcium-Vitamin D-Vitamin K (CALCIUM SOFT CHEWS PO) Take by mouth daily.  (Patient not taking: Reported on 08/08/2019)  . cholecalciferol (VITAMIN D3) 25 MCG (1000 UT) tablet Take by mouth daily. Unsure of dose  . diazepam (VALIUM) 5 MG tablet TAKE ONE TABLET BY MOUTH NIGHTLY AT BEDTIME AS NEEDED  . glucose blood (ACCU-CHEK SMARTVIEW) test strip 1 each by Other route as needed for other. Use as instructed  . levothyroxine (SYNTHROID) 50 MCG tablet TAKE 1.5 TABLETS (75 MCG TOTAL) BY MOUTH DAILY.  . Magnesium 250 MG TABS Take 1 tablet by mouth daily.  . meloxicam (MOBIC) 15 MG tablet Take 15 mg by mouth daily. (Patient not taking: Reported on 08/08/2019)  . metFORMIN (GLUCOPHAGE-XR) 500 MG 24 hr tablet Take 1 tablet (500 mg total) by mouth 2 (two) times daily.  . Multiple Vitamin (MULTIVITAMIN) capsule Take 1 capsule by mouth daily. (Patient not taking: Reported on 08/08/2019)  . Multiple Vitamins-Minerals (PRESERVISION AREDS PO) Take by mouth 2 (two) times daily.  . pantoprazole (PROTONIX) 20 MG tablet TAKE 1 TABLET DAILY (Patient not taking: Reported on 08/08/2019)  . rosuvastatin (CRESTOR) 10 MG tablet TAKE 1  TABLET AT BEDTIME  . sertraline (ZOLOFT) 100 MG tablet Take 1 tablet (100 mg total) by mouth daily.   No facility-administered medications prior to visit.    Review of Systems  Constitutional: Negative for appetite change, chills, fatigue and fever.  Respiratory: Negative for chest tightness and shortness of breath.   Cardiovascular: Negative for chest pain and palpitations.  Gastrointestinal: Negative for abdominal pain, nausea and vomiting.  Neurological: Negative for dizziness and weakness.    {Heme  Chem  Endocrine  Serology  Results Review (optional):23779::" "}  Objective    There were no vitals taken for this visit. {Show previous vital signs (optional):23777::" "}  Physical Exam  ***  No results found for any visits on 09/16/19.  Assessment & Plan     ***  No follow-ups on file.      {provider attestation***:1}   [name], MD  [facility] [phone] (phone) [phone] (fax)  [place]

## 2019-09-12 DIAGNOSIS — E890 Postprocedural hypothyroidism: Secondary | ICD-10-CM | POA: Diagnosis not present

## 2019-09-12 DIAGNOSIS — E119 Type 2 diabetes mellitus without complications: Secondary | ICD-10-CM | POA: Diagnosis not present

## 2019-09-16 ENCOUNTER — Ambulatory Visit: Payer: Self-pay | Admitting: Family Medicine

## 2019-09-30 ENCOUNTER — Other Ambulatory Visit: Payer: Self-pay | Admitting: Family Medicine

## 2019-09-30 DIAGNOSIS — E119 Type 2 diabetes mellitus without complications: Secondary | ICD-10-CM

## 2019-10-09 ENCOUNTER — Telehealth: Payer: Self-pay | Admitting: *Deleted

## 2019-10-09 NOTE — Telephone Encounter (Signed)
Copied from [place] [id number]. Topic: General - Other >> Oct 08, 2019  4:51 PM Rainey Pines A wrote: Patient is having increasing pain when urinating and its waking her up every hour all night. Patient would like an urology referral to Dr. Hollice Espy at [facility].

## 2019-10-14 DIAGNOSIS — H353221 Exudative age-related macular degeneration, left eye, with active choroidal neovascularization: Secondary | ICD-10-CM | POA: Diagnosis not present

## 2019-11-14 DIAGNOSIS — Z23 Encounter for immunization: Secondary | ICD-10-CM | POA: Diagnosis not present

## 2019-11-17 DIAGNOSIS — H353112 Nonexudative age-related macular degeneration, right eye, intermediate dry stage: Secondary | ICD-10-CM | POA: Diagnosis not present

## 2019-11-17 DIAGNOSIS — H353221 Exudative age-related macular degeneration, left eye, with active choroidal neovascularization: Secondary | ICD-10-CM | POA: Diagnosis not present

## 2019-11-17 LAB — HM DIABETES EYE EXAM

## 2019-11-21 NOTE — Progress Notes (Signed)
Established patient visit   Patient: Nicole Young   DOB: [birth year]   79 y.o. Female  MRN: [id number] Visit Date: 11/24/2019  Today's healthcare provider: [name], MD   No chief complaint on file.  Subjective    HPI  Overall patient feels well.  She has had no falls she has no complaints.  She is tolerating her medications well.  Emotionally she feels well. Diabetes Mellitus Type II, follow-up  Lab Results  Component Value Date   HGBA1C 6.1 (A) 05/22/2019   HGBA1C 6.3 (A) 12/11/2018   HGBA1C 6.6 (H) 07/24/2018   Last seen for diabetes 6 months ago.  Management since then includes; Probably excellent control with 17 pound weight loss. She reports excellent compliance with treatment. She is not having side effects.   Home blood sugar records: fasting range: 105-115  Episodes of hypoglycemia? No    Current insulin regiment: None Most Recent Eye Exam: UTD  --------------------------------------------------------------------------------------------------- Hypothyroid, follow-up  Lab Results  Component Value Date   TSH 0.396 (L) 06/02/2019   TSH 0.819 07/24/2018   TSH 0.621 07/17/2017   Wt Readings from Last 3 Encounters:  05/22/19 130 lb 12.8 oz (59.3 kg)  12/11/18 140 lb 12.8 oz (63.9 kg)  08/16/18 143 lb (64.9 kg)    She was last seen for hypothyroid 6 months ago.  Management since that visit includes; labs checked showing-Thyroid slightly overtreated. Would decrease Synthroid from 75 to 50 mcg daily. Recheck 3 to 4 months. She reports excellent compliance with treatment. She is not having side effects.   Symptoms: No change in energy level No constipation  No diarrhea No heat / cold intolerance  No nervousness No palpitations  No weight changes    ----------------------------------------------------------------------------------------- Depression, Follow-up  She  was last seen for this 6 months ago. Changes made at last visit  include; On sertraline.  Consider coming off of this in the next year.   She reports good compliance with treatment. She is not having side effects.   She reports good tolerance of treatment. Current symptoms include:  She feels she is Unchanged since last visit.  Depression screen [facility] 2/9 02/03/2019 12/11/2018 03/19/2018  Decreased Interest 0 0 -  Down, Depressed, Hopeless 0 0 0  PHQ - 2 Score 0 0 0  Altered sleeping - 0 -  Tired, decreased energy - 0 -  Change in appetite - 0 -  Feeling bad or failure about yourself  - 0 -  Trouble concentrating - 0 -  Moving slowly or fidgety/restless - 0 -  Suicidal thoughts - 0 -  PHQ-9 Score - 0 -  Difficult doing work/chores - Not difficult at all -    -----------------------------------------------------------------------------------------  Gastroesophageal reflux disease, unspecified whether esophagitis present From 05/22/2019-Be careful with use of meloxicam.  On pantoprazole.      Medications: Outpatient Medications Prior to Visit  Medication Sig  . Accu-Chek FastClix Lancets MISC CHECK SUGAR ONCE DAILY. DX: E11.9  . ACCU-CHEK SMARTVIEW test strip USE TO CHECK BLOOD SUGAR ONCE A DAY  . benzonatate (TESSALON) 200 MG capsule Take 1 capsule (200 mg total) by mouth 3 (three) times daily as needed.  . Calcium-Vitamin D-Vitamin K (CALCIUM SOFT CHEWS PO) Take by mouth daily.  (Patient not taking: Reported on 08/08/2019)  . cholecalciferol (VITAMIN D3) 25 MCG (1000 UT) tablet Take by mouth daily. Unsure of dose  . diazepam (VALIUM) 5 MG tablet TAKE ONE TABLET BY MOUTH NIGHTLY AT BEDTIME AS  NEEDED  . levothyroxine (SYNTHROID) 50 MCG tablet TAKE 1.5 TABLETS (75 MCG TOTAL) BY MOUTH DAILY.  . Magnesium 250 MG TABS Take 1 tablet by mouth daily.  . meloxicam (MOBIC) 15 MG tablet Take 15 mg by mouth daily. (Patient not taking: Reported on 08/08/2019)  . metFORMIN (GLUCOPHAGE-XR) 500 MG 24 hr tablet Take 1 tablet (500 mg total) by mouth 2 (two) times  daily.  . Multiple Vitamin (MULTIVITAMIN) capsule Take 1 capsule by mouth daily. (Patient not taking: Reported on 08/08/2019)  . Multiple Vitamins-Minerals (PRESERVISION AREDS PO) Take by mouth 2 (two) times daily.  . pantoprazole (PROTONIX) 20 MG tablet TAKE 1 TABLET DAILY (Patient not taking: Reported on 08/08/2019)  . rosuvastatin (CRESTOR) 10 MG tablet TAKE 1 TABLET AT BEDTIME  . sertraline (ZOLOFT) 100 MG tablet Take 1 tablet (100 mg total) by mouth daily.   No facility-administered medications prior to visit.    Review of Systems  Constitutional: Negative for appetite change, chills, fatigue and fever.  Respiratory: Negative for chest tightness and shortness of breath.   Cardiovascular: Negative for chest pain and palpitations.  Gastrointestinal: Negative for abdominal pain, nausea and vomiting.  Neurological: Negative for dizziness and weakness.       Objective    There were no vitals taken for this visit. BP Readings from Last 3 Encounters:  08/09/19 (!) 129/75  05/22/19 108/65  12/11/18 108/60   Wt Readings from Last 3 Encounters:  05/22/19 130 lb 12.8 oz (59.3 kg)  12/11/18 140 lb 12.8 oz (63.9 kg)  08/16/18 143 lb (64.9 kg)      Physical Exam Vitals reviewed.  Constitutional:      Appearance: She is well-developed.  HENT:     Head: Normocephalic and atraumatic.     Right Ear: External ear normal.     Left Ear: External ear normal.     Nose: Nose normal.  Eyes:     General: No scleral icterus.    Conjunctiva/sclera: Conjunctivae normal.  Neck:     Thyroid: No thyromegaly.  Cardiovascular:     Rate and Rhythm: Normal rate and regular rhythm.     Heart sounds: Normal heart sounds.  Pulmonary:     Effort: Pulmonary effort is normal.     Breath sounds: Normal breath sounds.  Abdominal:     Palpations: Abdomen is soft.     Tenderness: There is no guarding or rebound. Negative signs include Murphy's sign and McBurney's sign.  Musculoskeletal:     Right  lower leg: No edema.     Left lower leg: No edema.  Skin:    General: Skin is warm and dry.  Neurological:     General: No focal deficit present.     Mental Status: She is alert and oriented to person, place, and time.  Psychiatric:        Mood and Affect: Mood normal.        Behavior: Behavior normal.        Thought Content: Thought content normal.        Judgment: Judgment normal.       No results found for any visits on 11/24/19.  Assessment & Plan     1. Type 2 diabetes mellitus without complication, without long-term current use of insulin (HCC) Uncontrolled 6.1 with no hypoglycemia. - POCT glycosylated hemoglobin (Hb A1C) - glucose blood (ACCU-CHEK SMARTVIEW) test strip; USE TO CHECK BLOOD SUGAR ONCE A DAY  Dispense: 100 strip; Refill: 12  2. Essential (primary) hypertension  Very good control  3. Adult hypothyroidism TSH normal.  4. Essential hypertension   5. Mixed hyperlipidemia On Crestor 10 mg, LDL goal less than 70 6.  Depression In remission on sertraline 100 mg daily.  Reassess next.  No follow-ups on file.         Braylynn Ghan Cranford Mon, MD  [facility] [phone] (phone) [phone] (fax)  [place]

## 2019-11-24 ENCOUNTER — Ambulatory Visit (INDEPENDENT_AMBULATORY_CARE_PROVIDER_SITE_OTHER): Payer: Medicare Other | Admitting: Family Medicine

## 2019-11-24 ENCOUNTER — Encounter: Payer: Self-pay | Admitting: Family Medicine

## 2019-11-24 ENCOUNTER — Other Ambulatory Visit: Payer: Self-pay

## 2019-11-24 VITALS — BP 115/72 | HR 76 | Temp 97.6°F | Wt 133.0 lb

## 2019-11-24 DIAGNOSIS — I10 Essential (primary) hypertension: Secondary | ICD-10-CM | POA: Diagnosis not present

## 2019-11-24 DIAGNOSIS — E782 Mixed hyperlipidemia: Secondary | ICD-10-CM | POA: Diagnosis not present

## 2019-11-24 DIAGNOSIS — E039 Hypothyroidism, unspecified: Secondary | ICD-10-CM

## 2019-11-24 DIAGNOSIS — E119 Type 2 diabetes mellitus without complications: Secondary | ICD-10-CM | POA: Diagnosis not present

## 2019-11-24 DIAGNOSIS — F320 Major depressive disorder, single episode, mild: Secondary | ICD-10-CM | POA: Diagnosis not present

## 2019-11-24 LAB — POCT GLYCOSYLATED HEMOGLOBIN (HGB A1C): Hemoglobin A1C: 6.1 % — AB (ref 4.0–5.6)

## 2019-11-24 MED ORDER — ACCU-CHEK SMARTVIEW VI STRP: ORAL_STRIP | 12 refills | Status: DC

## 2019-11-26 ENCOUNTER — Other Ambulatory Visit: Payer: Self-pay | Admitting: Family Medicine

## 2019-11-26 DIAGNOSIS — G2581 Restless legs syndrome: Secondary | ICD-10-CM

## 2019-11-26 NOTE — Telephone Encounter (Signed)
Pt called in for a refill for diazepam (VALIUM) 5 MG tablet    Pharmacy:  [facility] [place] [address] [address], [place] Phone:  [phone]  Fax:  [phone]

## 2019-11-26 NOTE — Telephone Encounter (Signed)
Requested medication (s) are due for refill today: expired medication  Requested medication (s) are on the active medication list: yes   Last refill:  07/23/2018 #30 1 refill  Future visit scheduled: yes in 3 months  Notes to clinic:  expired medication. Not delegated per protocol     Requested Prescriptions  Pending Prescriptions Disp Refills   diazepam (VALIUM) 5 MG tablet 30 tablet 1    Sig: TAKE ONE TABLET BY MOUTH NIGHTLY AT BEDTIME AS NEEDED      Not Delegated - Psychiatry:  Anxiolytics/Hypnotics Failed - 11/26/2019  3:02 PM      Failed - This refill cannot be delegated      Failed - Urine Drug Screen completed in last 360 days      Passed - Valid encounter within last 6 months    Recent Outpatient Visits           2 days ago Type 2 diabetes mellitus without complication, without long-term current use of insulin [facility])   [facility] Jerrol Banana., MD   3 months ago [place], [place], [place]   6 months ago Type 2 diabetes mellitus without complication, without long-term current use of insulin [facility])   [facility] Jerrol Banana., MD   7 months ago Fever, unspecified fever cause   [place], [place]   11 months ago Type 2 diabetes mellitus without complication, without long-term current use of insulin [facility])   [facility] Jerrol Banana., MD       Future Appointments             In 3 months Jerrol Banana., MD [facility]

## 2019-11-27 MED ORDER — DIAZEPAM 5 MG PO TABS: ORAL_TABLET | ORAL | 1 refills | Status: DC

## 2019-11-28 ENCOUNTER — Other Ambulatory Visit: Payer: Self-pay | Admitting: Family Medicine

## 2019-11-28 DIAGNOSIS — G2581 Restless legs syndrome: Secondary | ICD-10-CM

## 2019-11-28 DIAGNOSIS — E039 Hypothyroidism, unspecified: Secondary | ICD-10-CM

## 2019-11-28 NOTE — Telephone Encounter (Signed)
Requested medication (s) are due for refill today - unsure  Requested medication (s) are on the active medication list -yes  Future visit scheduled -yes  Last refill: 12/16/18  Notes to clinic: Request non delegated rx  Requested Prescriptions  Pending Prescriptions Disp Refills   diazepam (VALIUM) 5 MG tablet [Pharmacy Med Name: DIAZEPAM 5 MG TABLET] 30 tablet     Sig: TAKE 1 TABLET BY MOUTH EVERY DAY AT BEDTIME AS NEEDED      Not Delegated - Psychiatry:  Anxiolytics/Hypnotics Failed - 11/28/2019  1:41 PM      Failed - This refill cannot be delegated      Failed - Urine Drug Screen completed in last 360 days      Passed - Valid encounter within last 6 months    Recent Outpatient Visits           4 days ago Type 2 diabetes mellitus without complication, without long-term current use of insulin ([place])   [facility] Jerrol Banana., MD   3 months ago [place] Burnette, [place], [place]   6 months ago Type 2 diabetes mellitus without complication, without long-term current use of insulin [facility])   [facility] Jerrol Banana., MD   7 months ago Fever, unspecified fever cause   [facility] [place], [place], [place]   11 months ago Type 2 diabetes mellitus without complication, without long-term current use of insulin [facility])   [facility] Jerrol Banana., MD       Future Appointments             In 3 months Jerrol Banana., MD [facility], [facility]                Requested Prescriptions  Pending Prescriptions Disp Refills   diazepam (VALIUM) 5 MG tablet [Pharmacy Med Name: DIAZEPAM 5 MG TABLET] 30 tablet     Sig: TAKE 1 TABLET BY MOUTH EVERY DAY AT BEDTIME AS NEEDED      Not Delegated - Psychiatry:  Anxiolytics/Hypnotics Failed - 11/28/2019  1:41 PM      Failed - This refill cannot be delegated      Failed - Urine Drug Screen completed in last  360 days      Passed - Valid encounter within last 6 months    Recent Outpatient Visits           4 days ago Type 2 diabetes mellitus without complication, without long-term current use of insulin [facility])   [facility] Jerrol Banana., MD   3 months ago [place] Burnette, [place], [place]   6 months ago Type 2 diabetes mellitus without complication, without long-term current use of insulin [facility])   [facility] Jerrol Banana., MD   7 months ago Fever, unspecified fever cause   [place], [place]   11 months ago Type 2 diabetes mellitus without complication, without long-term current use of insulin [facility])   [facility] Jerrol Banana., MD       Future Appointments             In 3 months Jerrol Banana., MD [facility], [facility]

## 2019-11-28 NOTE — Telephone Encounter (Signed)
Pt called about status of refill request/ Looks like the refill was sent to pharmacy yesterday for diazepam (VALIUM) 5 MG tablet  But the CLASS says Print/ please resend

## 2019-11-29 DIAGNOSIS — Z23 Encounter for immunization: Secondary | ICD-10-CM | POA: Diagnosis not present

## 2019-12-01 ENCOUNTER — Other Ambulatory Visit: Payer: Self-pay | Admitting: Family Medicine

## 2020-01-05 DIAGNOSIS — H353221 Exudative age-related macular degeneration, left eye, with active choroidal neovascularization: Secondary | ICD-10-CM | POA: Diagnosis not present

## 2020-01-09 ENCOUNTER — Other Ambulatory Visit: Payer: Self-pay | Admitting: Family Medicine

## 2020-01-09 DIAGNOSIS — F320 Major depressive disorder, single episode, mild: Secondary | ICD-10-CM

## 2020-02-16 NOTE — Progress Notes (Unsigned)
Subjective:   Nicole Young is a 80 y.o. female who presents for Medicare Annual (Subsequent) preventive examination.  I connected with Lakshmi Sundeen today by telephone and verified that I am speaking with the correct person using two identifiers. Location patient: home Location provider: work Persons participating in the virtual visit: patient, provider.   I discussed the limitations, risks, security and privacy concerns of performing an evaluation and management service by telephone and the availability of in person appointments. I also discussed with the patient that there may be a patient responsible charge related to this service. The patient expressed understanding and verbally consented to this telephonic visit.    Interactive audio and video telecommunications were attempted between this provider and patient, however failed, due to patient having technical difficulties OR patient did not have access to video capability.  We continued and completed visit with audio only.   Review of Systems    N/A  Cardiac Risk Factors include: advanced age (>56men, >62 women);diabetes mellitus;dyslipidemia     Objective:    There were no vitals filed for this visit. There is no height or weight on file to calculate BMI.  Advanced Directives 02/17/2020 02/03/2019 02/08/2018 01/22/2017 12/16/2015 12/16/2015 12/07/2015  Does Patient Have a Medical Advance Directive? Yes Yes Yes Yes No No No  Type of Arts administrator Power of [employer] Power of Attorney - - -  Does patient want to make changes to medical advance directive? - - - - - - -  Copy of [place] in Chart? No - copy requested No - copy requested No - copy requested No - copy requested - - -  Would patient like information on creating a medical advance directive? - - - - - No - Patient declined -    Current Medications (verified) Outpatient  Encounter Medications as of 02/17/2020  Medication Sig  . Accu-Chek FastClix Lancets MISC CHECK SUGAR ONCE DAILY. DX: E11.9  . Calcium-Vitamin D-Vitamin K (CALCIUM SOFT CHEWS PO) Take by mouth daily.   . cholecalciferol (VITAMIN D3) 25 MCG (1000 UT) tablet Take 600 Units by mouth daily.  . diazepam (VALIUM) 5 MG tablet TAKE 1 TABLET BY MOUTH EVERY DAY AT BEDTIME AS NEEDED  . glucose blood (ACCU-CHEK SMARTVIEW) test strip USE TO CHECK BLOOD SUGAR ONCE A DAY  . levothyroxine (SYNTHROID) 50 MCG tablet TAKE ONE AND ONE-HALF TABLETS DAILY (Patient taking differently: Take 50 mcg by mouth daily before breakfast.)  . Magnesium 250 MG TABS Take 1 tablet by mouth daily.  . meloxicam (MOBIC) 15 MG tablet Take 15 mg by mouth daily.  . metFORMIN (GLUCOPHAGE-XR) 500 MG 24 hr tablet Take 1 tablet (500 mg total) by mouth 2 (two) times daily. (Patient taking differently: Take 1,000 mg by mouth daily with breakfast.)  . Multiple Vitamin (MULTIVITAMIN) capsule Take 1 capsule by mouth daily.   . Multiple Vitamins-Minerals (PRESERVISION AREDS PO) Take by mouth 2 (two) times daily.  . pantoprazole (PROTONIX) 20 MG tablet TAKE 1 TABLET DAILY  . rosuvastatin (CRESTOR) 10 MG tablet TAKE 1 TABLET AT BEDTIME  . sertraline (ZOLOFT) 100 MG tablet TAKE 1 TABLET DAILY  . benzonatate (TESSALON) 200 MG capsule Take 1 capsule (200 mg total) by mouth 3 (three) times daily as needed. (Patient not taking: Reported on 02/17/2020)   No facility-administered encounter medications on file as of 02/17/2020.    Allergies (verified) Neomycin-polymyxin-gramicidin, Farxiga [dapagliflozin], Neosporin [neomycin-bacitracin zn-polymyx], Sulfa  antibiotics, Thimerosal, and Zostavax  [zoster vaccine live]   History: Past Medical History:  Diagnosis Date  . AMD (acid maltase deficiency) (HCC)   . Complication of anesthesia    slow to wake up in past  . Depression    controlled  . Diabetes mellitus without complication (HCC)    controlled  .  Hyperlipidemia   . Hypothyroidism   . Neuropathy   . Osteoarthritis    knee, ankles, shoulders, back, neck, hands/fingers, wrists;   . Rectocele   . Thyroid disease   . 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 AUGMENTATION MAMMAPLASTY  1975  . AUGMENTATION MAMMAPLASTY  2017   2nd augmentation and lift  . BREAST SURGERY     impants replaced with lift  . CARPAL TUNNEL RELEASE Right 08/26/2014   Procedure: CARPAL TUNNEL RELEASE;  Surgeon: Deeann Saint, MD;  Location: [facility] ORS;  Service: Orthopedics;  Laterality: Right;  . COLONOSCOPY  2005   Dr Lemar Livings  . COLONOSCOPY N/A 06/24/2014   Procedure: COLONOSCOPY;  Surgeon: Earline Mayotte, MD;  Location: [facility] ENDOSCOPY;  Service: Endoscopy;  Laterality: N/A;  . PARATHYROIDECTOMY  1992  . PLACEMENT OF BREAST IMPLANTS  1975  . TONSILECTOMY, ADENOIDECTOMY, BILATERAL MYRINGOTOMY AND TUBES     Family History  Problem Relation Age of Onset  . Cancer Father        prostate  . Prostate cancer Father   . Hypertension Mother   . Heart attack Mother   . Heart attack Maternal Grandfather   . Diabetes Paternal Uncle   . Congestive Heart Failure Maternal Grandmother   . CVA Paternal Grandmother   . Prostate cancer Paternal Grandfather   . Breast cancer Maternal Aunt    Social History   Socioeconomic History  . Marital status: Widowed    Spouse name: Not on file  . Number of children: 3  . Years of education: Not on file  . Highest education level: Bachelor's degree (e.g., BA, AB, BS)  Occupational History  . Occupation: retired  Tobacco Use  . Smoking status: Never Smoker  . Smokeless tobacco: Never Used  Vaping Use  . Vaping Use: Never used  Substance and Sexual Activity  . Alcohol use: Yes    Alcohol/week: 1.0 standard drink    Types: 1 Glasses of wine per week  . Drug use: No  . Sexual activity: Not on file  Other Topics Concern  . Not on file  Social History Narrative   . Not on file   Social Determinants of Health   Financial Resource Strain: Low Risk   . Difficulty of Paying Living Expenses: Not hard at all  Food Insecurity: No Food Insecurity  . Worried About Programme researcher, broadcasting/film/video in the Last Year: Never true  . Ran Out of Food in the Last Year: Never true  Transportation Needs: No Transportation Needs  . Lack of Transportation (Medical): No  . Lack of Transportation (Non-Medical): No  Physical Activity: Insufficiently Active  . Days of Exercise per Week: 6 days  . Minutes of Exercise per Session: 20 min  Stress: No Stress Concern Present  . Feeling of Stress : Not at all  Social Connections: Socially Isolated  . Frequency of Communication with Friends and Family: More than three times a week  . Frequency of Social Gatherings with Friends and Family: More than three times a week  . Attends Religious Services: Never  . Active  Member of Clubs or Organizations: No  . Attends Banker Meetings: Never  . Marital Status: Widowed    Tobacco Counseling Counseling given: Not Answered   Clinical Intake:  Pre-visit preparation completed: Yes  Pain : No/denies pain     Nutritional Risks: None Diabetes: Yes  How often do you need to have someone help you when you read instructions, pamphlets, or other written materials from your doctor or pharmacy?: 1 - Never  Diabetic? Yes  Nutrition Risk Assessment:  Has the patient had any N/V/D within the last 2 months?  No  Does the patient have any non-healing wounds?  No  Has the patient had any unintentional weight loss or weight gain?  No   Diabetes:  Is the patient diabetic?  Yes  If diabetic, was a CBG obtained today?  No  Did the patient bring in their glucometer from home?  No  How often do you monitor your CBG's? Once a day.   Financial Strains and Diabetes Management:  Are you having any financial strains with the device, your supplies or your medication? No .  Does the  patient want to be seen by Chronic Care Management for management of their diabetes?  No  Would the patient like to be referred to a Nutritionist or for Diabetic Management?  No   Diabetic Exams:  Diabetic Eye Exam: Completed 11/17/19 Diabetic Foot Exam: Completed 09/12/19 by Dr Tedd Sias.   Interpreter Needed?: No  Information entered by :: [facility], LPN   Activities of Daily Living In your present state of health, do you have any difficulty performing the following activities: 02/17/2020 11/24/2019  Hearing? N Y  Comment Wears bilateral hearing aids in both ears. -  Vision? N Y  Difficulty concentrating or making decisions? N N  Walking or climbing stairs? N N  Dressing or bathing? N N  Doing errands, shopping? N N  Preparing Food and eating ? N -  Using the Toilet? N -  In the past six months, have you accidently leaked urine? Y -  Comment Due to cystocele. -  Do you have problems with loss of bowel control? 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 N -  Managing your Finances? N -  Housekeeping or managing your Housekeeping? N -  Some recent data might be hidden    Patient Care Team: Maple Hudson., MD as PCP - General (Family Medicine) Tedd Sias, Marlana Salvage, MD as Physician Assistant (Endocrinology) Nadara Mustard, MD as Referring Physician (Obstetrics and Gynecology) Nicholaus Corolla, MD as Referring Physician (Ophthalmology)  Indicate any recent Medical Services you may have received from other than [facility] providers in the past year (date may be approximate).     Assessment:   This is a routine wellness examination for [place].  Hearing/Vision screen No exam data present  Dietary issues and exercise activities discussed: Current Exercise Habits: Home exercise routine, Type of exercise: walking, Time (Minutes): 20, Frequency (Times/Week): 6, Weekly Exercise (Minutes/Week): 120, Intensity: Mild, Exercise limited by: None identified  Goals     . Prevent falls     Recommend to remove any items from the home that may cause slips or trips.      Depression Screen PHQ 2/9 Scores 02/17/2020 11/24/2019 02/03/2019 12/11/2018 03/19/2018 02/08/2018 07/17/2017  PHQ - 2 Score 0 0 0 0 0 0 0  PHQ- 9 Score - 0 - 0 - - 0  Exception Documentation - - - -  Patient refusal - -    Fall Risk Fall Risk  02/17/2020 11/24/2019 02/03/2019 12/11/2018 03/19/2018  Falls in the past year? 1 1 0 0 0  Comment Roller skating accident. - - - -  Number falls in past yr: 0 0 0 0 -  Comment - - - - -  Injury with Fall? 0 0 0 0 -  Comment - - - - -  Risk for fall due to : - No Fall Risks - - -  Follow up Falls prevention discussed Falls evaluation completed - Falls evaluation completed -    FALL RISK PREVENTION PERTAINING TO THE HOME:  Any stairs in or around the home? No  If so, are there any without handrails? No  Home free of loose throw rugs in walkways, pet beds, electrical cords, etc? Yes  Adequate lighting in your home to reduce risk of falls? Yes   ASSISTIVE DEVICES UTILIZED TO PREVENT FALLS:  Life alert? No  Use of a cane, walker or w/c? No  Grab bars in the bathroom? Yes  Shower chair or bench in shower? Yes  Elevated toilet seat or a handicapped toilet? Yes    Cognitive Function: Normal cognitive status assessed by observation by this Nurse Health Advisor. No abnormalities found.       6CIT Screen 02/03/2019 12/16/2015  What Year? 0 points 0 points  What month? 0 points 0 points  What time? 0 points 0 points  Count back from 20 0 points 0 points  Months in reverse 0 points 0 points  Repeat phrase 0 points 0 points  Total Score 0 0    Immunizations Immunization History  Administered Date(s) Administered  . Fluad Quad(high Dose 65+) 11/29/2019  . Influenza, High Dose Seasonal PF 10/17/2016, 11/10/2017, 10/14/2018  . Influenza-Unspecified 10/23/2014, 10/14/2018  . PFIZER(Purple Top)SARS-COV-2 Vaccination 01/14/2019, 02/04/2019, 11/14/2019   . Pneumococcal Conjugate-13 12/16/2013  . Pneumococcal Polysaccharide-23 12/23/2015  . Tdap 02/14/2013    TDAP status: Up to date  Flu Vaccine status: Up to date  Pneumococcal vaccine status: Up to date  Covid-19 vaccine status: Completed vaccines  Qualifies for Shingles Vaccine? Yes   Zostavax completed No   Shingrix Completed?: No.    Education has been provided regarding the importance of this vaccine. Patient has been advised to call insurance company to determine out of pocket expense if they have not yet received this vaccine. Advised may also receive vaccine at local pharmacy or Health Dept. Verbalized acceptance and understanding.  Screening Tests Health Maintenance  Topic Date Due  . URINE MICROALBUMIN  10/17/2017  . HEMOGLOBIN A1C  05/23/2020  . FOOT EXAM  09/11/2020  . OPHTHALMOLOGY EXAM  11/16/2020  . TETANUS/TDAP  02/15/2023  . DEXA SCAN  10/14/2023  . INFLUENZA VACCINE  Completed  . COVID-19 Vaccine  Completed  . PNA vac Low Risk Adult  Completed    Health Maintenance  Health Maintenance Due  Topic Date Due  . URINE MICROALBUMIN  10/17/2017    Colorectal cancer screening: No longer required.   Mammogram status: No longer required due to age.  Bone Density status: Completed 10/14/18. Results reflect: Bone density results: OSTEOPENIA. Repeat every 5 years.  Lung Cancer Screening: (Low Dose CT Chest recommended if Age 75-80 years, 30 pack-year currently smoking OR have quit w/in 15years.) does not qualify.   Additional Screening:  Vision Screening: Recommended annual ophthalmology exams for early detection of glaucoma and other disorders of the eye. Is the patient up to date with  their annual eye exam?  Yes  Who is the provider or what is the name of the office in which the patient attends annual eye exams? Dr Roosevelt Locks @ [place] If pt is not established with a provider, would they like to be referred to a provider to establish care? No .   Dental Screening:  Recommended annual dental exams for proper oral hygiene  Community Resource Referral / Chronic Care Management: CRR required this visit?  No   CCM required this visit?  No      Plan:     I have personally reviewed and noted the following in the patient's chart:   . Medical and social history . Use of alcohol, tobacco or illicit drugs  . Current medications and supplements . Functional ability and status . Nutritional status . Physical activity . Advanced directives . List of other physicians . Hospitalizations, surgeries, and ER visits in previous 12 months . Vitals . Screenings to include cognitive, depression, and falls . Referrals and appointments  In addition, I have reviewed and discussed with patient certain preventive protocols, quality metrics, and best practice recommendations. A written personalized care plan for preventive services as well as general preventive health recommendations were provided to patient.     Kennedy Bohanon [place], [place]   06/15/3417   Nurse Notes: Pt needs a urine check at next in office apt.

## 2020-02-17 ENCOUNTER — Ambulatory Visit (INDEPENDENT_AMBULATORY_CARE_PROVIDER_SITE_OTHER): Payer: Medicare Other

## 2020-02-17 ENCOUNTER — Other Ambulatory Visit: Payer: Self-pay

## 2020-02-17 DIAGNOSIS — Z0000 Encounter for general adult medical examination without abnormal findings: Secondary | ICD-10-CM

## 2020-02-17 DIAGNOSIS — H353221 Exudative age-related macular degeneration, left eye, with active choroidal neovascularization: Secondary | ICD-10-CM | POA: Diagnosis not present

## 2020-02-17 NOTE — Patient Instructions (Signed)
Nicole Young , Thank you for taking time to come for your Medicare Wellness Visit. I appreciate your ongoing commitment to your health goals. Please review the following plan we discussed and let me know if I can assist you in the future.   Screening recommendations/referrals: Colonoscopy: No longer required.  Mammogram: No longer required.  Bone Density: Up to date, due 10/2023 Recommended yearly ophthalmology/optometry visit for glaucoma screening and checkup Recommended yearly dental visit for hygiene and checkup  Vaccinations: Influenza vaccine: Done 11/29/19 Pneumococcal vaccine: Completed series Tdap vaccine: Up to date, due 02/2023 Shingles vaccine: Shingrix discussed. Please contact your pharmacy for coverage information.     Advanced directives: Please bring a copy of your POA (Power of Attorney) and/or Living Will to your next appointment.   Conditions/risks identified: Fall risk preventatives discussed today.   Next appointment: 03/23/20 @ 11:00 AM with Dr Rosanna Randy    Preventive Care 65 Years and Older, Female Preventive care refers to lifestyle choices and visits with your health care provider that can promote health and wellness. What does preventive care include?  A yearly physical exam. This is also called an annual well check.  Dental exams once or twice a year.  Routine eye exams. Ask your health care provider how often you should have your eyes checked.  Personal lifestyle choices, including:  Daily care of your teeth and gums.  Regular physical activity.  Eating a healthy diet.  Avoiding tobacco and drug use.  Limiting alcohol use.  Practicing safe sex.  Taking low-dose aspirin every day.  Taking vitamin and mineral supplements as recommended by your health care provider. What happens during an annual well check? The services and screenings done by your health care provider during your annual well check will depend on your age, overall health,  lifestyle risk factors, and family history of disease. Counseling  Your health care provider may ask you questions about your:  Alcohol use.  Tobacco use.  Drug use.  Emotional well-being.  Home and relationship well-being.  Sexual activity.  Eating habits.  History of falls.  Memory and ability to understand (cognition).  Work and work Statistician.  Reproductive health. Screening  You may have the following tests or measurements:  Height, weight, and BMI.  Blood pressure.  Lipid and cholesterol levels. These may be checked every 5 years, or more frequently if you are over 59 years old.  Skin check.  Lung cancer screening. You may have this screening every year starting at age 86 if you have a 30-pack-year history of smoking and currently smoke or have quit within the past 15 years.  Fecal occult blood test (FOBT) of the stool. You may have this test every year starting at age 72.  Flexible sigmoidoscopy or colonoscopy. You may have a sigmoidoscopy every 5 years or a colonoscopy every 10 years starting at age 84.  Hepatitis C blood test.  Hepatitis B blood test.  Sexually transmitted disease (STD) testing.  Diabetes screening. This is done by checking your blood sugar (glucose) after you have not eaten for a while (fasting). You may have this done every 1-3 years.  Bone density scan. This is done to screen for osteoporosis. You may have this done starting at age 44.  Mammogram. This may be done every 1-2 years. Talk to your health care provider about how often you should have regular mammograms. Talk with your health care provider about your test results, treatment options, and if necessary, the need for more tests. Vaccines  Your health care provider may recommend certain vaccines, such as:  Influenza vaccine. This is recommended every year.  Tetanus, diphtheria, and acellular pertussis (Tdap, Td) vaccine. You may need a Td booster every 10 years.  Zoster  vaccine. You may need this after age 66.  Pneumococcal 13-valent conjugate (PCV13) vaccine. One dose is recommended after age 21.  Pneumococcal polysaccharide (PPSV23) vaccine. One dose is recommended after age 70. Talk to your health care provider about which screenings and vaccines you need and how often you need them. This information is not intended to replace advice given to you by your health care provider. Make sure you discuss any questions you have with your health care provider. Document Released: 01/22/2015 Document Revised: 09/15/2015 Document Reviewed: 10/27/2014 Elsevier Interactive Patient Education  2017 [place] Prevention in the Home Falls can cause injuries. They can happen to people of all ages. There are many things you can do to make your home safe and to help prevent falls. What can I do on the outside of my home?  Regularly fix the edges of walkways and driveways and fix any cracks.  Remove anything that might make you trip as you walk through a door, such as a raised step or threshold.  Trim any bushes or trees on the path to your home.  Use bright outdoor lighting.  Clear any walking paths of anything that might make someone trip, such as rocks or tools.  Regularly check to see if handrails are loose or broken. Make sure that both sides of any steps have handrails.  Any raised decks and porches should have guardrails on the edges.  Have any leaves, snow, or ice cleared regularly.  Use sand or salt on walking paths during winter.  Clean up any spills in your garage right away. This includes oil or grease spills. What can I do in the bathroom?  Use night lights.  Install grab bars by the toilet and in the tub and shower. Do not use towel bars as grab bars.  Use non-skid mats or decals in the tub or shower.  If you need to sit down in the shower, use a plastic, non-slip stool.  Keep the floor dry. Clean up any water that spills on the  floor as soon as it happens.  Remove soap buildup in the tub or shower regularly.  Attach bath mats securely with double-sided non-slip rug tape.  Do not have throw rugs and other things on the floor that can make you trip. What can I do in the bedroom?  Use night lights.  Make sure that you have a light by your bed that is easy to reach.  Do not use any sheets or blankets that are too big for your bed. They should not hang down onto the floor.  Have a firm chair that has side arms. You can use this for support while you get dressed.  Do not have throw rugs and other things on the floor that can make you trip. What can I do in the kitchen?  Clean up any spills right away.  Avoid walking on wet floors.  Keep items that you use a lot in easy-to-reach places.  If you need to reach something above you, use a strong step stool that has a grab bar.  Keep electrical cords out of the way.  Do not use floor polish or wax that makes floors slippery. If you must use wax, use non-skid floor wax.  Do  not have throw rugs and other things on the floor that can make you trip. What can I do with my stairs?  Do not leave any items on the stairs.  Make sure that there are handrails on both sides of the stairs and use them. Fix handrails that are broken or loose. Make sure that handrails are as long as the stairways.  Check any carpeting to make sure that it is firmly attached to the stairs. Fix any carpet that is loose or worn.  Avoid having throw rugs at the top or bottom of the stairs. If you do have throw rugs, attach them to the floor with carpet tape.  Make sure that you have a light switch at the top of the stairs and the bottom of the stairs. If you do not have them, ask someone to add them for you. What else can I do to help prevent falls?  Wear shoes that:  Do not have high heels.  Have rubber bottoms.  Are comfortable and fit you well.  Are closed at the toe. Do not wear  sandals.  If you use a stepladder:  Make sure that it is fully opened. Do not climb a closed stepladder.  Make sure that both sides of the stepladder are locked into place.  Ask someone to hold it for you, if possible.  Clearly mark and make sure that you can see:  Any grab bars or handrails.  First and last steps.  Where the edge of each step is.  Use tools that help you move around (mobility aids) if they are needed. These include:  Canes.  Walkers.  Scooters.  Crutches.  Turn on the lights when you go into a dark area. Replace any light bulbs as soon as they burn out.  Set up your furniture so you have a clear path. Avoid moving your furniture around.  If any of your floors are uneven, fix them.  If there are any pets around you, be aware of where they are.  Review your medicines with your doctor. Some medicines can make you feel dizzy. This can increase your chance of falling. Ask your doctor what other things that you can do to help prevent falls. This information is not intended to replace advice given to you by your health care provider. Make sure you discuss any questions you have with your health care provider. Document Released: 10/22/2008 Document Revised: 06/03/2015 Document Reviewed: 01/30/2014 Elsevier Interactive Patient Education  2017 [employer].

## 2020-03-01 DIAGNOSIS — H5712 Ocular pain, left eye: Secondary | ICD-10-CM | POA: Diagnosis not present

## 2020-03-02 ENCOUNTER — Other Ambulatory Visit: Payer: Self-pay | Admitting: Family Medicine

## 2020-03-02 DIAGNOSIS — E119 Type 2 diabetes mellitus without complications: Secondary | ICD-10-CM

## 2020-03-02 NOTE — Telephone Encounter (Signed)
Requested Prescriptions  Pending Prescriptions Disp Refills   metFORMIN (GLUCOPHAGE-XR) 500 MG 24 hr tablet [Pharmacy Med Name: METFORMIN HCL ER TABS 500MG] 180 tablet 3    Sig: TAKE 1 TABLET TWICE A DAY     Endocrinology:  Diabetes - Biguanides Passed - 03/02/2020 12:27 AM      Passed - Cr in normal range and within 360 days    Creatinine, Ser  Date Value Ref Range Status  06/02/2019 0.67 0.57 - 1.00 mg/dL Final         Passed - HBA1C is between 0 and 7.9 and within 180 days    Hemoglobin A1C  Date Value Ref Range Status  11/24/2019 6.1 (A) 4.0 - 5.6 % Final    Comment:    Average 128   Hgb A1c MFr Bld  Date Value Ref Range Status  07/24/2018 6.6 (H) 4.8 - 5.6 % Final    Comment:             Prediabetes: 5.7 - 6.4          Diabetes: >6.4          Glycemic control for adults with diabetes: <7.0          Passed - eGFR in normal range and within 360 days    GFR calc Af Amer  Date Value Ref Range Status  06/02/2019 96 >59 mL/min/1.73 Final    Comment:    **[facility] currently reports eGFR in compliance with the current**   recommendations of the [employer]. [facility] will   update reporting as new guidelines are published from the NKF-ASN   Task force.    GFR calc non Af Amer  Date Value Ref Range Status  06/02/2019 83 >59 mL/min/1.73 Final         Passed - Valid encounter within last 6 months    Recent Outpatient Visits          3 months ago Type 2 diabetes mellitus without complication, without long-term current use of insulin [facility])   [facility] Jerrol Banana., MD   6 months ago [place], Anderson Malta M, [place]   9 months ago Type 2 diabetes mellitus without complication, without long-term current use of insulin [facility])   [facility] Jerrol Banana., MD   10 months ago Fever, unspecified fever cause   [place], Vickki Muff, PA-C   1 year ago Type 2  diabetes mellitus without complication, without long-term current use of insulin [facility])   [facility] Jerrol Banana., MD      Future Appointments            In 3 weeks Jerrol Banana., MD [facility]

## 2020-03-11 ENCOUNTER — Other Ambulatory Visit: Payer: Self-pay | Admitting: Family Medicine

## 2020-03-11 DIAGNOSIS — E7849 Other hyperlipidemia: Secondary | ICD-10-CM

## 2020-03-23 ENCOUNTER — Other Ambulatory Visit: Payer: Self-pay

## 2020-03-23 ENCOUNTER — Encounter: Payer: Self-pay | Admitting: Family Medicine

## 2020-03-23 ENCOUNTER — Ambulatory Visit (INDEPENDENT_AMBULATORY_CARE_PROVIDER_SITE_OTHER): Payer: Medicare Other | Admitting: Family Medicine

## 2020-03-23 VITALS — BP 110/62 | HR 76 | Temp 97.9°F | Resp 16 | Ht 60.0 in | Wt 135.0 lb

## 2020-03-23 DIAGNOSIS — I10 Essential (primary) hypertension: Secondary | ICD-10-CM

## 2020-03-23 DIAGNOSIS — E782 Mixed hyperlipidemia: Secondary | ICD-10-CM

## 2020-03-23 DIAGNOSIS — E039 Hypothyroidism, unspecified: Secondary | ICD-10-CM

## 2020-03-23 DIAGNOSIS — E119 Type 2 diabetes mellitus without complications: Secondary | ICD-10-CM | POA: Diagnosis not present

## 2020-03-23 DIAGNOSIS — G2581 Restless legs syndrome: Secondary | ICD-10-CM | POA: Diagnosis not present

## 2020-03-23 DIAGNOSIS — H60543 Acute eczematoid otitis externa, bilateral: Secondary | ICD-10-CM

## 2020-03-23 MED ORDER — MOMETASONE FUROATE 0.1 % EX CREA: 1.0000 "application " | TOPICAL_CREAM | Freq: Every day | CUTANEOUS | 0 refills | Status: DC

## 2020-03-23 MED ORDER — DIAZEPAM 2 MG PO TABS: 2.0000 mg | ORAL_TABLET | Freq: Two times a day (BID) | ORAL | 3 refills | Status: DC | PRN

## 2020-03-23 NOTE — Progress Notes (Signed)
I,[name],acting as a scribe for Nicole Durie, MD.,have documented all relevant documentation on the behalf of Nicole Durie, MD,as directed by  Nicole Durie, MD while in the presence of Nicole Durie, MD.   Established patient visit   Patient: Nicole Young   DOB: [birth year]   80 y.o. Female  MRN: [id number] Visit Date: 03/23/2020  Today's healthcare provider: [name], MD   Chief Complaint  Patient presents with  . Follow-up  . Diabetes  . Hyperlipidemia  . Hypertension   Subjective    HPI  Patient comes in today for follow-up.  She feels well.  She is very active.  She actually feels better than she did 10 years ago.  She has had all 3 Covid vaccines. 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 She requests refills on diazepam which she takes about once a week at 2.5 mg (half a 5 mg dose). Also needs mometasone solution for external otitis media for which she uses a Q-tip to put the medicine in her ear every now and then. Diabetes Mellitus Type II, follow-up  Lab Results  Component Value Date   HGBA1C 6.1 (A) 11/24/2019   HGBA1C 6.1 (A) 05/22/2019   HGBA1C 6.3 (A) 12/11/2018   Last seen for diabetes 4 months ago.  Management since then includes continuing the same treatment. She reports good compliance with treatment. She is not having side effects. none  Home blood sugar records: fasting range: 95-110  Episodes of hypoglycemia? No none   Current insulin regiment: n/a Most Recent Eye Exam: 11/17/2019  --------------------------------------------------------------------  Hypertension, follow-up  BP Readings from Last 3 Encounters:  03/23/20 110/62  11/24/19 115/72  08/09/19 (!) 129/75   Wt Readings from Last 3 Encounters:  03/23/20 135 lb (61.2 kg)  11/24/19 133 lb (60.3 kg)  05/22/19 130 lb 12.8 oz (59.3 kg)     She was last seen for hypertension 4 months  ago.  BP at that visit was 115/72. Management since that visit includes no changes. She reports good compliance with treatment. She is not having side effects. none She is exercising. She is adherent to low salt diet.   Outside blood pressures are 120/70.  She does not smoke.  Use of agents associated with hypertension: none.   ---------------------------------------------------------  Lipid/Cholesterol, follow-up  Last Lipid Panel: Lab Results  Component Value Date   CHOL 144 06/02/2019   LDLCALC 67 06/02/2019   HDL 60 06/02/2019   TRIG 92 06/02/2019    She was last seen for this 4 months ago.  Management since that visit includes no changes. She reports good compliance with treatment. She is having side effects. noneowing a Regular, Low Sodium diet. Current exercise: walking  Last metabolic panel Lab Results  Component Value Date   GLUCOSE 104 (H) 06/02/2019   NA 133 (L) 06/02/2019   K 4.3 06/02/2019   BUN 21 06/02/2019   CREATININE 0.67 06/02/2019   GFRNONAA 83 06/02/2019   GFRAA 96 06/02/2019   CALCIUM 9.5 06/02/2019   AST 19 06/02/2019   ALT 15 06/02/2019   The ASCVD Risk score (Goff DC Jr., et al., 2013) failed to calculate for the following reasons:   The 2013 ASCVD risk score is only valid for ages 61 to 76       Medications: Outpatient Medications Prior to Visit  Medication Sig  . Accu-Chek [employer]  Lancets MISC CHECK SUGAR ONCE DAILY. DX: E11.9  . Calcium-Vitamin D-Vitamin K (CALCIUM SOFT CHEWS PO) Take by mouth daily.   . cholecalciferol (VITAMIN D3) 25 MCG (1000 UT) tablet Take 600 Units by mouth daily.  . diazepam (VALIUM) 5 MG tablet TAKE 1 TABLET BY MOUTH EVERY DAY AT BEDTIME AS NEEDED  . glucose blood (ACCU-CHEK SMARTVIEW) test strip USE TO CHECK BLOOD SUGAR ONCE A DAY  . levothyroxine (SYNTHROID) 50 MCG tablet TAKE ONE AND ONE-HALF TABLETS DAILY (Patient taking differently: Take 50 mcg by mouth daily before breakfast.)  . Magnesium 250 MG  TABS Take 1 tablet by mouth daily.  . metFORMIN (GLUCOPHAGE-XR) 500 MG 24 hr tablet TAKE 1 TABLET TWICE A DAY  . Multiple Vitamin (MULTIVITAMIN) capsule Take 1 capsule by mouth daily.   . Multiple Vitamins-Minerals (PRESERVISION AREDS PO) Take by mouth 2 (two) times daily.  . rosuvastatin (CRESTOR) 10 MG tablet TAKE 1 TABLET AT BEDTIME  . sertraline (ZOLOFT) 100 MG tablet TAKE 1 TABLET DAILY  . benzonatate (TESSALON) 200 MG capsule Take 1 capsule (200 mg total) by mouth 3 (three) times daily as needed. (Patient not taking: No sig reported)  . meloxicam (MOBIC) 15 MG tablet Take 15 mg by mouth daily. (Patient not taking: Reported on 03/23/2020)  . pantoprazole (PROTONIX) 20 MG tablet TAKE 1 TABLET DAILY (Patient not taking: Reported on 03/23/2020)   No facility-administered medications prior to visit.    Review of Systems  Constitutional: Negative for appetite change, chills, fatigue and fever.  Respiratory: Negative for chest tightness and shortness of breath.   Cardiovascular: Negative for chest pain and palpitations.  Gastrointestinal: Negative for abdominal pain, nausea and vomiting.  Neurological: Negative for dizziness and weakness.        Objective    BP 110/62 (BP Location: Right Arm, Patient Position: Sitting, Cuff Size: Normal)   Pulse 76   Temp 97.9 F (36.6 C) (Oral)   Resp 16   Ht 5' (1.524 m)   Wt 135 lb (61.2 kg)   SpO2 96%   BMI 26.37 kg/m  BP Readings from Last 3 Encounters:  03/23/20 110/62  11/24/19 115/72  08/09/19 (!) 129/75   Wt Readings from Last 3 Encounters:  03/23/20 135 lb (61.2 kg)  11/24/19 133 lb (60.3 kg)  05/22/19 130 lb 12.8 oz (59.3 kg)       Physical Exam Vitals reviewed.  Constitutional:      Appearance: She is well-developed.  HENT:     Head: Normocephalic and atraumatic.     Right Ear: External ear normal.     Left Ear: External ear normal.     Nose: Nose normal.  Eyes:     General: No scleral icterus.    Conjunctiva/sclera:  Conjunctivae normal.  Neck:     Thyroid: No thyromegaly.  Cardiovascular:     Rate and Rhythm: Normal rate and regular rhythm.     Heart sounds: Normal heart sounds.  Pulmonary:     Effort: Pulmonary effort is normal.     Breath sounds: Normal breath sounds.  Abdominal:     Palpations: Abdomen is soft.     Tenderness: There is no guarding or rebound. Negative signs include Murphy's sign and McBurney's sign.  Musculoskeletal:     Right lower leg: No edema.     Left lower leg: No edema.  Skin:    General: Skin is warm and dry.  Neurological:     General: No focal deficit present.  Mental Status: She is alert and oriented to person, place, and time.  Psychiatric:        Mood and Affect: Mood normal.        Behavior: Behavior normal.        Thought Content: Thought content normal.        Judgment: Judgment normal.       No results found for any visits on 03/23/20.  Assessment & Plan     1. Type 2 diabetes mellitus without complication, without long-term current use of insulin (HCC) Has been under good control for several years.  Check A1c, goal A1c less than 7.5 - CBC with Differential/Platelet - Comprehensive metabolic panel - Lipid panel - TSH - Hemoglobin A1c  2. Essential (primary) hypertension Check urine microalbumin on next visit.  Blood pressure well controlled at this time. - CBC with Differential/Platelet - Comprehensive metabolic panel - Lipid panel - TSH - Hemoglobin A1c  3. Adult hypothyroidism On Synthroid 50 mcg daily - CBC with Differential/Platelet - Comprehensive metabolic panel - Lipid panel - TSH - Hemoglobin A1c  4. Mixed hyperlipidemia On rosuvastatin 10 mg daily - CBC with Differential/Platelet - Comprehensive metabolic panel - Lipid panel - TSH - Hemoglobin A1c 5.  Eczema of both auditory canals Use mometasone cream very infrequently applied with a Q-tip  Return in about 5 months (around 08/23/2020).      I, Nicole Durie, MD, have reviewed all documentation for this visit. The documentation on 03/23/20 for the exam, diagnosis, procedures, and orders are all accurate and complete.    Richard Cranford Mon, MD  [facility] [phone] (phone) [phone] (fax)  [place]

## 2020-03-31 DIAGNOSIS — E039 Hypothyroidism, unspecified: Secondary | ICD-10-CM | POA: Diagnosis not present

## 2020-03-31 DIAGNOSIS — E119 Type 2 diabetes mellitus without complications: Secondary | ICD-10-CM | POA: Diagnosis not present

## 2020-03-31 DIAGNOSIS — E782 Mixed hyperlipidemia: Secondary | ICD-10-CM | POA: Diagnosis not present

## 2020-03-31 DIAGNOSIS — I10 Essential (primary) hypertension: Secondary | ICD-10-CM | POA: Diagnosis not present

## 2020-04-01 LAB — COMPREHENSIVE METABOLIC PANEL
ALT: 17 IU/L (ref 0–32)
AST: 20 IU/L (ref 0–40)
Albumin/Globulin Ratio: 1.9 (ref 1.2–2.2)
Albumin: 4.6 g/dL (ref 3.6–4.6)
Alkaline Phosphatase: 40 IU/L — ABNORMAL LOW (ref 44–121)
BUN/Creatinine Ratio: 19 (ref 12–28)
BUN: 15 mg/dL (ref 8–27)
Bilirubin Total: 0.4 mg/dL (ref 0.0–1.2)
CO2: 22 mmol/L (ref 20–29)
Calcium: 9.4 mg/dL (ref 8.7–10.3)
Chloride: 97 mmol/L (ref 96–106)
Creatinine, Ser: 0.79 mg/dL (ref 0.57–1.00)
Globulin, Total: 2.4 g/dL (ref 1.5–4.5)
Glucose: 114 mg/dL — ABNORMAL HIGH (ref 65–99)
Potassium: 4.4 mmol/L (ref 3.5–5.2)
Sodium: 137 mmol/L (ref 134–144)
Total Protein: 7 g/dL (ref 6.0–8.5)
eGFR: 75 mL/min/{1.73_m2} (ref >59)

## 2020-04-01 LAB — CBC WITH DIFFERENTIAL/PLATELET
Basophils Absolute: 0.1 10*3/uL (ref 0.0–0.2)
Basos: 1 %
EOS (ABSOLUTE): 0.1 10*3/uL (ref 0.0–0.4)
Eos: 1 %
Hematocrit: 40.1 % (ref 34.0–46.6)
Hemoglobin: 13.7 g/dL (ref 11.1–15.9)
Immature Grans (Abs): 0 10*3/uL (ref 0.0–0.1)
Immature Granulocytes: 0 %
Lymphocytes Absolute: 2.6 10*3/uL (ref 0.7–3.1)
Lymphs: 33 %
MCH: 31.1 pg (ref 26.6–33.0)
MCHC: 34.2 g/dL (ref 31.5–35.7)
MCV: 91 fL (ref 79–97)
Monocytes Absolute: 0.6 10*3/uL (ref 0.1–0.9)
Monocytes: 8 %
Neutrophils Absolute: 4.5 10*3/uL (ref 1.4–7.0)
Neutrophils: 57 %
Platelets: 228 10*3/uL (ref 150–450)
RBC: 4.41 x10E6/uL (ref 3.77–5.28)
RDW: 13.2 % (ref 11.7–15.4)
WBC: 7.8 10*3/uL (ref 3.4–10.8)

## 2020-04-01 LAB — TSH: TSH: 2.38 u[IU]/mL (ref 0.450–4.500)

## 2020-04-01 LAB — HEMOGLOBIN A1C
Est. average glucose Bld gHb Est-mCnc: 131 mg/dL
Hgb A1c MFr Bld: 6.2 % — ABNORMAL HIGH (ref 4.8–5.6)

## 2020-04-01 LAB — LIPID PANEL
Chol/HDL Ratio: 2.6 ratio (ref 0.0–4.4)
Cholesterol, Total: 164 mg/dL (ref 100–199)
HDL: 62 mg/dL (ref >39)
LDL Chol Calc (NIH): 82 mg/dL (ref 0–99)
Triglycerides: 115 mg/dL (ref 0–149)
VLDL Cholesterol Cal: 20 mg/dL (ref 5–40)

## 2020-04-02 ENCOUNTER — Other Ambulatory Visit: Payer: Self-pay | Admitting: Family Medicine

## 2020-04-02 DIAGNOSIS — Z1231 Encounter for screening mammogram for malignant neoplasm of breast: Secondary | ICD-10-CM

## 2020-04-05 ENCOUNTER — Encounter: Payer: Medicare Other | Admitting: Family Medicine

## 2020-04-05 NOTE — Progress Notes (Deleted)
MyChart Video Visit    Virtual Visit via Video Note   This visit type was conducted due to national recommendations for restrictions regarding the COVID-19 Pandemic (e.g. social distancing) in an effort to limit this patient's exposure and mitigate transmission in our community. This patient is at least at moderate risk for complications without adequate follow up. This format is felt to be most appropriate for this patient at this time. Physical exam was limited by quality of the video and audio technology used for the visit.   Patient location: *** Provider location: ***  I discussed the limitations of evaluation and management by telemedicine and the availability of in person appointments. The patient expressed understanding and agreed to proceed.  Patient: Nicole Young   DOB: 27-Feb-1940   80 y.o. Female  MRN: [id number] Visit Date: 04/05/2020  Today's healthcare provider: [name], MD   No chief complaint on file.  Subjective    HPI  ***  {Show patient history (optional):23778::" "}  Medications: Outpatient Medications Prior to Visit  Medication Sig  . Accu-Chek [employer] Lancets MISC CHECK SUGAR ONCE DAILY. DX: E11.9  . benzonatate (TESSALON) 200 MG capsule Take 1 capsule (200 mg total) by mouth 3 (three) times daily as needed. (Patient not taking: No sig reported)  . Calcium-Vitamin D-Vitamin K (CALCIUM SOFT CHEWS PO) Take by mouth daily.   . cholecalciferol (VITAMIN D3) 25 MCG (1000 UT) tablet Take 600 Units by mouth daily.  . diazepam (VALIUM) 2 MG tablet Take 1 tablet (2 mg total) by mouth every 12 (twelve) hours as needed for anxiety.  Marland Kitchen glucose blood (ACCU-CHEK SMARTVIEW) test strip USE TO CHECK BLOOD SUGAR ONCE A DAY  . levothyroxine (SYNTHROID) 50 MCG tablet TAKE ONE AND ONE-HALF TABLETS DAILY (Patient taking differently: Take 50 mcg by mouth daily before breakfast.)  . Magnesium 250 MG TABS Take 1 tablet by mouth daily.  . meloxicam (MOBIC) 15 MG  tablet Take 15 mg by mouth daily. (Patient not taking: Reported on 03/23/2020)  . metFORMIN (GLUCOPHAGE-XR) 500 MG 24 hr tablet TAKE 1 TABLET TWICE A DAY  . mometasone (ELOCON) 0.1 % cream Apply 1 application topically daily.  . Multiple Vitamin (MULTIVITAMIN) capsule Take 1 capsule by mouth daily.   . Multiple Vitamins-Minerals (PRESERVISION AREDS PO) Take by mouth 2 (two) times daily.  . pantoprazole (PROTONIX) 20 MG tablet TAKE 1 TABLET DAILY (Patient not taking: Reported on 03/23/2020)  . rosuvastatin (CRESTOR) 10 MG tablet TAKE 1 TABLET AT BEDTIME  . sertraline (ZOLOFT) 100 MG tablet TAKE 1 TABLET DAILY   No facility-administered medications prior to visit.    Review of Systems  {Labs  Heme  Chem  Endocrine  Serology  Results Review (optional):23779::" "}  Objective    There were no vitals taken for this visit. {Show previous vital signs (optional):23777::" "}  Physical Exam     Assessment & Plan     ***  No follow-ups on file.     I discussed the assessment and treatment plan with the patient. The patient was provided an opportunity to ask questions and all were answered. The patient agreed with the plan and demonstrated an understanding of the instructions.   The patient was advised to call back or seek an in-person evaluation if the symptoms worsen or if the condition fails to improve as anticipated.  I provided *** minutes of non-face-to-face time during this encounter.  {provider attestation***:1}  [name], MD [facility] [phone]  phone) [phone] (fax)  [place]

## 2020-04-05 NOTE — Progress Notes (Deleted)
      Established patient visit   Patient: Nicole Young   DOB: 1940-07-22   80 y.o. Female  MRN: [id number] Visit Date: 04/05/2020  Today's healthcare provider: [name], MD   No chief complaint on file.  Subjective    HPI    {Show patient history (optional):23778::" "}   Medications: Outpatient Medications Prior to Visit  Medication Sig  . Accu-Chek [employer] Lancets MISC CHECK SUGAR ONCE DAILY. DX: E11.9  . benzonatate (TESSALON) 200 MG capsule Take 1 capsule (200 mg total) by mouth 3 (three) times daily as needed. (Patient not taking: No sig reported)  . Calcium-Vitamin D-Vitamin K (CALCIUM SOFT CHEWS PO) Take by mouth daily.   . cholecalciferol (VITAMIN D3) 25 MCG (1000 UT) tablet Take 600 Units by mouth daily.  . diazepam (VALIUM) 2 MG tablet Take 1 tablet (2 mg total) by mouth every 12 (twelve) hours as needed for anxiety.  Marland Kitchen glucose blood (ACCU-CHEK SMARTVIEW) test strip USE TO CHECK BLOOD SUGAR ONCE A DAY  . levothyroxine (SYNTHROID) 50 MCG tablet TAKE ONE AND ONE-HALF TABLETS DAILY (Patient taking differently: Take 50 mcg by mouth daily before breakfast.)  . Magnesium 250 MG TABS Take 1 tablet by mouth daily.  . meloxicam (MOBIC) 15 MG tablet Take 15 mg by mouth daily. (Patient not taking: Reported on 03/23/2020)  . metFORMIN (GLUCOPHAGE-XR) 500 MG 24 hr tablet TAKE 1 TABLET TWICE A DAY  . mometasone (ELOCON) 0.1 % cream Apply 1 application topically daily.  . Multiple Vitamin (MULTIVITAMIN) capsule Take 1 capsule by mouth daily.   . Multiple Vitamins-Minerals (PRESERVISION AREDS PO) Take by mouth 2 (two) times daily.  . pantoprazole (PROTONIX) 20 MG tablet TAKE 1 TABLET DAILY (Patient not taking: Reported on 03/23/2020)  . rosuvastatin (CRESTOR) 10 MG tablet TAKE 1 TABLET AT BEDTIME  . sertraline (ZOLOFT) 100 MG tablet TAKE 1 TABLET DAILY   No facility-administered medications prior to visit.    Review of Systems  {Labs  Heme  Chem  Endocrine   Serology  Results Review (optional):23779::" "}   Objective    There were no vitals taken for this visit. {Show previous vital signs (optional):23777::" "}   Physical Exam  ***  No results found for any visits on 04/05/20.  Assessment & Plan     ***  No follow-ups on file.      {provider attestation***:1}   [name], MD  [facility] [phone] (phone) [phone] (fax)  [place]

## 2020-04-06 ENCOUNTER — Telehealth: Payer: Self-pay

## 2020-04-06 NOTE — Telephone Encounter (Signed)
She will not be charged.  I was unable to get through on the computer or the phone.  Please see what question we can help her with and I will try to answer it.  Her labs were all in normal range.

## 2020-04-06 NOTE — Telephone Encounter (Signed)
Copied from [place] [id number]. Topic: General - Other >> Apr 06, 2020  8:43 AM Celene Kras wrote: Reason for CRM: Pt called stating that the appt yesterday she was not able to speak with anyone for the appt. It is marked as complete under her appts. She states that she would not like to be charged for this appt as she was not able to see the doctor or speak to the nurse. Please advise.

## 2020-04-06 NOTE — Telephone Encounter (Signed)
Patient was notified. Patient wants to discuss results. Patient wanted to discuss cardio medications and some special labs she wants checked. Patient wants to speak to provider.

## 2020-04-07 NOTE — Progress Notes (Signed)
Unable to contact pt via computer or phone. This encounter was created in error - please disregard.

## 2020-04-12 DIAGNOSIS — H353221 Exudative age-related macular degeneration, left eye, with active choroidal neovascularization: Secondary | ICD-10-CM | POA: Diagnosis not present

## 2020-04-19 ENCOUNTER — Ambulatory Visit
Admission: RE | Admit: 2020-04-19 | Discharge: 2020-04-19 | Disposition: A | Discharge status: Home / Self Care | Payer: Medicare Other | Source: Ambulatory Visit | Attending: Family Medicine | Admitting: Family Medicine

## 2020-04-19 ENCOUNTER — Other Ambulatory Visit: Payer: Self-pay

## 2020-04-19 DIAGNOSIS — Z1231 Encounter for screening mammogram for malignant neoplasm of breast: Secondary | ICD-10-CM | POA: Insufficient documentation

## 2020-04-19 DIAGNOSIS — Z23 Encounter for immunization: Secondary | ICD-10-CM | POA: Diagnosis not present

## 2020-04-20 ENCOUNTER — Ambulatory Visit (INDEPENDENT_AMBULATORY_CARE_PROVIDER_SITE_OTHER): Payer: Medicare Other | Admitting: Family Medicine

## 2020-04-20 DIAGNOSIS — E559 Vitamin D deficiency, unspecified: Secondary | ICD-10-CM | POA: Diagnosis not present

## 2020-04-20 DIAGNOSIS — G629 Polyneuropathy, unspecified: Secondary | ICD-10-CM | POA: Diagnosis not present

## 2020-04-20 DIAGNOSIS — E119 Type 2 diabetes mellitus without complications: Secondary | ICD-10-CM | POA: Diagnosis not present

## 2020-04-20 DIAGNOSIS — E039 Hypothyroidism, unspecified: Secondary | ICD-10-CM | POA: Diagnosis not present

## 2020-04-20 DIAGNOSIS — R202 Paresthesia of skin: Secondary | ICD-10-CM

## 2020-04-20 DIAGNOSIS — I10 Essential (primary) hypertension: Secondary | ICD-10-CM | POA: Diagnosis not present

## 2020-04-20 DIAGNOSIS — H353290 Exudative age-related macular degeneration, unspecified eye, stage unspecified: Secondary | ICD-10-CM | POA: Diagnosis not present

## 2020-04-20 DIAGNOSIS — E782 Mixed hyperlipidemia: Secondary | ICD-10-CM

## 2020-04-20 DIAGNOSIS — G2581 Restless legs syndrome: Secondary | ICD-10-CM | POA: Diagnosis not present

## 2020-04-20 MED ORDER — ROSUVASTATIN CALCIUM 20 MG PO TABS: 20.0000 mg | ORAL_TABLET | Freq: Every day | ORAL | 3 refills | Status: DC

## 2020-04-20 NOTE — Progress Notes (Signed)
Virtual telephone visit    Virtual Visit via Telephone Note   This visit type was conducted due to national recommendations for restrictions regarding the COVID-19 Pandemic (e.g. social distancing) in an effort to limit this patient's exposure and mitigate transmission in our community. Due to her co-morbid illnesses, this patient is at least at moderate risk for complications without adequate follow up. This format is felt to be most appropriate for this patient at this time. The patient did not have access to video technology or had technical difficulties with video requiring transitioning to audio format only (telephone). Physical exam was limited to content and character of the telephone converstion.    Patient location: home Provider location: office  I discussed the limitations of evaluation and management by telemedicine and the availability of in person appointments. The patient expressed understanding and agreed to proceed.   Visit Date: 04/20/2020  Today's healthcare provider: [name], MD   No chief complaint on file.  Subjective    HPI   She feels well with but has several issues she wishes to discuss.  Her diabetes is stable and her last LDL cholesterol was 80 and her brother is a cardiologist I recommended it be 69. She has macular degeneration now and a history of low vitamin D. On her labs she also had a low alkaline phosphatase and unfortunately googled this and wants to know if she could be B12 deficient or have a low magnesium.  Again, she is asymptomatic for any new problems.       Medications: Outpatient Medications Prior to Visit  Medication Sig  . Accu-Chek Fast[employer]ix Lancets MISC CHECK SUGAR ONCE DAILY. DX: E11.9  . benzonatate (TESSALON) 200 MG capsule Take 1 capsule (200 mg total) by mouth 3 (three) times daily as needed. (Patient not taking: No sig reported)  . Calcium-Vitamin D-Vitamin K (CALCIUM SOFT CHEWS PO) Take by mouth daily.   .  cholecalciferol (VITAMIN D3) 25 MCG (1000 UT) tablet Take 600 Units by mouth daily.  . diazepam (VALIUM) 2 MG tablet Take 1 tablet (2 mg total) by mouth every 12 (twelve) hours as needed for anxiety.  Marland Kitchen glucose blood (ACCU-CHEK SMARTVIEW) test strip USE TO CHECK BLOOD SUGAR ONCE A DAY  . levothyroxine (SYNTHROID) 50 MCG tablet TAKE ONE AND ONE-HALF TABLETS DAILY (Patient taking differently: Take 50 mcg by mouth daily before breakfast.)  . Magnesium 250 MG TABS Take 1 tablet by mouth daily.  . meloxicam (MOBIC) 15 MG tablet Take 15 mg by mouth daily. (Patient not taking: Reported on 03/23/2020)  . metFORMIN (GLUCOPHAGE-XR) 500 MG 24 hr tablet TAKE 1 TABLET TWICE A DAY  . mometasone (ELOCON) 0.1 % cream Apply 1 application topically daily.  . Multiple Vitamin (MULTIVITAMIN) capsule Take 1 capsule by mouth daily.   . Multiple Vitamins-Minerals (PRESERVISION AREDS PO) Take by mouth 2 (two) times daily.  . pantoprazole (PROTONIX) 20 MG tablet TAKE 1 TABLET DAILY (Patient not taking: Reported on 03/23/2020)  . rosuvastatin (CRESTOR) 10 MG tablet TAKE 1 TABLET AT BEDTIME  . sertraline (ZOLOFT) 100 MG tablet TAKE 1 TABLET DAILY   No facility-administered medications prior to visit.    Review of Systems     Objective    There were no vitals taken for this visit. BP Readings from Last 3 Encounters:  03/23/20 110/62  11/24/19 115/72  08/09/19 (!) 129/75   Wt Readings from Last 3 Encounters:  03/23/20 135 lb (61.2 kg)  11/24/19 133 lb (60.3  kg)  05/22/19 130 lb 12.8 oz (59.3 kg)        Assessment & Plan     1. Type 2 diabetes mellitus without complication, without long-term current use of insulin (HCC) Goal A1c less than 7.5 - Hemoglobin A1c  2. Mixed hyperlipidemia Double atorvastatin dose with LDL goal less than 70 - Lipid panel  3. Restless leg  - Magnesium  4. Essential hypertension Controlled - Comprehensive metabolic panel  5. Adult hypothyroidism Follow TSH  6.  Exudative age-related macular degeneration, unspecified laterality, unspecified stage [facility]) Per ophthalmology  7. Vitamin D deficiency On vitamin D supple - VITAMIN D 25 Hydroxy (Vit-D Deficiency, Fractures)  8. Neuropathy B12 and folate  9. Tingling  - B12 and Folate Panel   No follow-ups on file.    I discussed the assessment and treatment plan with the patient. The patient was provided an opportunity to ask questions and all were answered. The patient agreed with the plan and demonstrated an understanding of the instructions.   The patient was advised to call back or seek an in-person evaluation if the symptoms worsen or if the condition fails to improve as anticipated.  I provided 11 minutes of non-face-to-face time during this encounter.  I, [name], MD, have reviewed all documentation for this visit. The documentation on 04/21/20 for the exam, diagnosis, procedures, and orders are all accurate and complete.   Zuriyah Shatz Cranford Mon, MD [facility] [phone] (phone) [phone] (fax)  [place]

## 2020-04-23 ENCOUNTER — Other Ambulatory Visit: Payer: Self-pay | Admitting: Family Medicine

## 2020-04-23 DIAGNOSIS — E7849 Other hyperlipidemia: Secondary | ICD-10-CM

## 2020-05-19 DIAGNOSIS — M25512 Pain in left shoulder: Secondary | ICD-10-CM | POA: Diagnosis not present

## 2020-05-31 ENCOUNTER — Ambulatory Visit: Payer: Self-pay

## 2020-05-31 ENCOUNTER — Other Ambulatory Visit: Payer: Self-pay | Admitting: *Deleted

## 2020-05-31 DIAGNOSIS — R002 Palpitations: Secondary | ICD-10-CM

## 2020-05-31 NOTE — Telephone Encounter (Signed)
Pt. Reports she has noticed palpitations x 1 week. Mainly at rest. No other symptoms. No chest pain or shortness of breath. Prefers to see Dr. Rosanna Randy only. No availability today pr tomorrow. Instructed to go to ED for worsening of symptoms. Please advise pt.  Reason for Disposition . Age > 60 years (Exception: brief heartbeat symptoms that went away and now feels well)  Answer Assessment - Initial Assessment Questions 1. DESCRIPTION: "Please describe your heart rate or heartbeat that you are having" (e.g., fast/slow, regular/irregular, skipped or extra beats, "palpitations")     Palpitations and skipping beats 2. ONSET: "When did it start?" (Minutes, hours or days)      Started 1 week ago 3. DURATION: "How long does it last" (e.g., seconds, minutes, hours)     Lasts a few minutes 4. PATTERN "Does it come and go, or has it been constant since it started?"  "Does it get worse with exertion?"   "Are you feeling it now?"     Comes and goes 5. TAP: "Using your hand, can you tap out what you are feeling on a chair or table in front of you, so that I can hear?" (Note: not all patients can do this)       No 6. HEART RATE: "Can you tell me your heart rate?" "How many beats in 15 seconds?"  (Note: not all patients can do this)       Now - 78 7. RECURRENT SYMPTOM: "Have you ever had this before?" If Yes, ask: "When was the last time?" and "What happened that time?"      No 8. CAUSE: "What do you think is causing the palpitations?"     Unsure 9. CARDIAC HISTORY: "Do you have any history of heart disease?" (e.g., heart attack, angina, bypass surgery, angioplasty, arrhythmia)      Has PCV's  10. OTHER SYMPTOMS: "Do you have any other symptoms?" (e.g., dizziness, chest pain, sweating, difficulty breathing)       No 11. PREGNANCY: "Is there any chance you are pregnant?" "When was your last menstrual period?"       No  Protocols used: HEART RATE AND HEARTBEAT QUESTIONS-A-AH

## 2020-05-31 NOTE — Telephone Encounter (Signed)
Patient was advised and agrees to get referral to cardiology. Referral is pending. Patient is going to call us back with info on who she prefers to see.

## 2020-05-31 NOTE — Telephone Encounter (Signed)
Please advise 

## 2020-05-31 NOTE — Telephone Encounter (Signed)
No appointments available.  We can refer to cardiology if she prefers.

## 2020-06-01 ENCOUNTER — Telehealth: Payer: Self-pay

## 2020-06-01 NOTE — Telephone Encounter (Signed)
Copied from Central [id number]. Topic: General - Other >> Jun 01, 2020  8:52 AM Alanda Slim E wrote: Reason for CRM: Pt called to speak with Sharyn Lull about a referral they were discussing yesterday/ please advise

## 2020-06-01 NOTE — Telephone Encounter (Signed)
Returned call to patient. Referral ordered.

## 2020-06-01 NOTE — Telephone Encounter (Signed)
Patient returned call. Referral ordered.

## 2020-06-08 DIAGNOSIS — H353221 Exudative age-related macular degeneration, left eye, with active choroidal neovascularization: Secondary | ICD-10-CM | POA: Diagnosis not present

## 2020-07-06 DIAGNOSIS — E7800 Pure hypercholesterolemia, unspecified: Secondary | ICD-10-CM

## 2020-07-06 DIAGNOSIS — R002 Palpitations: Secondary | ICD-10-CM | POA: Diagnosis not present

## 2020-07-06 DIAGNOSIS — E119 Type 2 diabetes mellitus without complications: Secondary | ICD-10-CM | POA: Diagnosis not present

## 2020-07-06 HISTORY — DX: Pure hypercholesterolemia, unspecified: E78.00

## 2020-07-06 HISTORY — DX: Palpitations: R00.2

## 2020-07-07 ENCOUNTER — Other Ambulatory Visit: Payer: Self-pay | Admitting: Family Medicine

## 2020-07-07 DIAGNOSIS — H60543 Acute eczematoid otitis externa, bilateral: Secondary | ICD-10-CM

## 2020-07-07 DIAGNOSIS — F320 Major depressive disorder, single episode, mild: Secondary | ICD-10-CM

## 2020-07-07 NOTE — Telephone Encounter (Signed)
Requested medication (s) are due for refill today: Yes  Requested medication (s) are on the active medication list: Yes  Last refill:  03/23/20  Future visit scheduled: Yes  Notes to clinic:  See request.    Requested Prescriptions  Pending Prescriptions Disp Refills   mometasone (ELOCON) 0.1 % cream [Pharmacy Med Name: MOMETASONE FUROATE 0.1% CREAM] 45 g 0    Sig: Apply 1 application topically daily.      Off-Protocol Failed - 07/07/2020  1:01 PM      Failed - Medication not assigned to a protocol, review manually.      Passed - Valid encounter within last 12 months    Recent Outpatient Visits           2 months ago Type 2 diabetes mellitus without complication, without long-term current use of insulin [facility])   [facility] Jerrol Banana., MD   3 months ago Type 2 diabetes mellitus without complication, without long-term current use of insulin [facility])   [facility] Jerrol Banana., MD   7 months ago Type 2 diabetes mellitus without complication, without long-term current use of insulin [facility])   [facility] Jerrol Banana., MD   11 months ago [place], [place], [place]   1 year ago Type 2 diabetes mellitus without complication, without long-term current use of insulin [facility])   [facility] Jerrol Banana., MD

## 2020-08-05 DIAGNOSIS — R002 Palpitations: Secondary | ICD-10-CM | POA: Diagnosis not present

## 2020-08-09 DIAGNOSIS — I493 Ventricular premature depolarization: Secondary | ICD-10-CM

## 2020-08-09 DIAGNOSIS — E7800 Pure hypercholesterolemia, unspecified: Secondary | ICD-10-CM | POA: Diagnosis not present

## 2020-08-09 DIAGNOSIS — R002 Palpitations: Secondary | ICD-10-CM | POA: Diagnosis not present

## 2020-08-09 HISTORY — DX: Ventricular premature depolarization: I49.3

## 2020-08-10 DIAGNOSIS — H353221 Exudative age-related macular degeneration, left eye, with active choroidal neovascularization: Secondary | ICD-10-CM | POA: Diagnosis not present

## 2020-08-11 ENCOUNTER — Ambulatory Visit: Payer: Self-pay | Admitting: Family Medicine

## 2020-08-11 ENCOUNTER — Encounter: Payer: Medicare Other | Admitting: Family Medicine

## 2020-08-16 ENCOUNTER — Ambulatory Visit (INDEPENDENT_AMBULATORY_CARE_PROVIDER_SITE_OTHER): Payer: Medicare Other | Admitting: Obstetrics & Gynecology

## 2020-08-16 ENCOUNTER — Other Ambulatory Visit (HOSPITAL_COMMUNITY)
Admission: RE | Admit: 2020-08-16 | Discharge: 2020-08-16 | Disposition: A | Discharge status: Home / Self Care | Payer: Medicare Other | Source: Ambulatory Visit | Attending: Obstetrics & Gynecology | Admitting: Obstetrics & Gynecology

## 2020-08-16 ENCOUNTER — Encounter: Payer: Self-pay | Admitting: Obstetrics & Gynecology

## 2020-08-16 ENCOUNTER — Other Ambulatory Visit: Payer: Self-pay

## 2020-08-16 VITALS — BP 120/80 | Ht 60.0 in | Wt 138.0 lb

## 2020-08-16 DIAGNOSIS — N952 Postmenopausal atrophic vaginitis: Secondary | ICD-10-CM

## 2020-08-16 DIAGNOSIS — Z8619 Personal history of other infectious and parasitic diseases: Secondary | ICD-10-CM

## 2020-08-16 DIAGNOSIS — Z1151 Encounter for screening for human papillomavirus (HPV): Secondary | ICD-10-CM | POA: Insufficient documentation

## 2020-08-16 DIAGNOSIS — Z124 Encounter for screening for malignant neoplasm of cervix: Secondary | ICD-10-CM | POA: Diagnosis not present

## 2020-08-16 DIAGNOSIS — R151 Fecal smearing: Secondary | ICD-10-CM | POA: Diagnosis not present

## 2020-08-16 DIAGNOSIS — N3281 Overactive bladder: Secondary | ICD-10-CM

## 2020-08-16 DIAGNOSIS — N816 Rectocele: Secondary | ICD-10-CM

## 2020-08-16 MED ORDER — OXYBUTYNIN CHLORIDE ER 5 MG PO TB24: 5.0000 mg | ORAL_TABLET | Freq: Every day | ORAL | 11 refills | Status: DC

## 2020-08-16 NOTE — Progress Notes (Signed)
Cystocele/Rectocele Patient complains of a rectocele and also worsening OAB sx's this year.  Rectocele has been known, and she has to help occas w BMs but this is not bad for her.  She feels she is having new and bothersome fecal incontinence and leakage and feels her sphincter is not working well.  Urinary, she has nocturia 2-4 times nightly and urgency mostly just in the evenings and at night. . Problem started 1  year  ago (OAB).   PMHx: She  has a past medical history of AMD (acid maltase deficiency) ([place]), Complication of anesthesia, Depression, Diabetes mellitus without complication ([place]), Hyperlipidemia, Hypothyroidism, Neuropathy, Osteoarthritis, Rectocele, Thyroid disease, and 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[place]). Also,  has a past surgical history that includes Tonsilectomy, adenoidectomy, bilateral myringotomy and tubes; Parathyroidectomy (1992); Placement of breast implants (1975); Colonoscopy (2005); Colonoscopy (N/A, 06/24/2014); Carpal tunnel release (Right, 08/26/2014); Breast surgery; Augmentation mammaplasty (1975); and Augmentation mammaplasty (2017)., family history includes Breast cancer in her maternal aunt; CVA in her paternal grandmother; Cancer in her father; Congestive Heart Failure in her maternal grandmother; Diabetes in her paternal uncle; Heart attack in her maternal grandfather and mother; Hypertension in her mother; Prostate cancer in her father and paternal grandfather.,  reports that she has never smoked. She has never used smokeless tobacco. She reports current alcohol use of about 1.0 standard drink of alcohol per week. She reports that she does not use drugs.  She has a current medication list which includes the following prescription(s): accu-chek fastclix lancets, benzonatate, calcium-vitamin d-vitamin k, cholecalciferol, diazepam, accu-chek smartview, levothyroxine, magnesium, meloxicam, metformin, mometasone, multivitamin, multiple vitamins-minerals, oxybutynin,  pantoprazole, rosuvastatin, and sertraline. Also, is allergic to neomycin-polymyxin-gramicidin, farxiga [dapagliflozin], neosporin [neomycin-bacitracin zn-polymyx], sulfa antibiotics, thimerosal, and zostavax  [zoster vaccine live].  Review of Systems  Constitutional:  Negative for chills, fever and malaise/fatigue.  HENT:  Negative for congestion, sinus pain and sore throat.   Eyes:  Negative for blurred vision and pain.  Respiratory:  Negative for cough and wheezing.   Cardiovascular:  Negative for chest pain and leg swelling.  Gastrointestinal:  Negative for abdominal pain, constipation, diarrhea, heartburn, nausea and vomiting.  Genitourinary:  Negative for dysuria, frequency, hematuria and urgency.  Musculoskeletal:  Negative for back pain, joint pain, myalgias and neck pain.  Skin:  Negative for itching and rash.  Neurological:  Negative for dizziness, tremors and weakness.  Endo/Heme/Allergies:  Does not bruise/bleed easily.  Psychiatric/Behavioral:  Negative for depression. The patient is not nervous/anxious and does not have insomnia.    Objective: BP 120/80   Ht 5' (1.524 m)   Wt 138 lb (62.6 kg)   BMI 26.95 kg/m  Physical Exam Constitutional:      General: She is not in acute distress.    Appearance: She is well-developed.  Genitourinary:     Bladder normal.     Right Labia: No rash or tenderness.    Left Labia: No tenderness or rash.    No vaginal erythema or bleeding.     Moderate vaginal atrophy present.     Right Adnexa: not tender and no mass present.    Left Adnexa: not tender and no mass present.    No cervical motion tenderness, discharge, polyp or nabothian cyst.     Uterus is not enlarged.     No uterine mass detected.    Uterus exam comments: Small, no prolapse.     Uterus is midaxial.     Bladder exam comments: Min cyctocele.  Pelvic exam was performed with patient in the lithotomy position.  Rectum:     Abnormal anal tone present.     Rectal exam  comments: Gr 3 Rectocele.  HENT:     Head: Normocephalic and atraumatic.     Nose: Nose normal.  Abdominal:     General: There is no distension.     Palpations: Abdomen is soft.     Tenderness: There is no abdominal tenderness.  Musculoskeletal:        General: Normal range of motion.  Neurological:     Mental Status: She is alert and oriented to person, place, and time.     Cranial Nerves: No cranial nerve deficit.  Skin:    General: Skin is warm and dry.  Psychiatric:        Attention and Perception: Attention normal.        Mood and Affect: Mood and affect normal.        Speech: Speech normal.        Behavior: Behavior normal.        Thought Content: Thought content normal.        Judgment: Judgment normal.    ASSESSMENT/PLAN:   Problem List Items Addressed This Visit       Digestive   Rectocele     Genitourinary   Vaginal atrophy - Primary   Other Visit Diagnoses     Overactive bladder       Relevant Medications   oxybutynin (DITROPAN-XL) 5 MG 24 hr tablet   Fecal smearing       Relevant Orders   Ambulatory referral to General Surgery   History of infection due to human papilloma virus (HPV)       Relevant Orders   Cytology - PAP     Will tx OAB and start w medicine therapy; pros and cons and side effects discussed Follow up 2 mos  Referral for anal sphincter concerns as well as colonoscopy as pt desires this to be done at the right time even though she is >79 (sister w colon cancer dx age 44)   Barnett Applebaum, MD, [facility], [facility] 08/16/2020  10:37 AM

## 2020-08-16 NOTE — Patient Instructions (Signed)
Thank you for choosing [facility]. As part of our ongoing efforts to improve patient experience, we would appreciate your feedback. Please fill out the short survey that you will receive by mail or MyChart. Your opinion is important to [place]! -Dr Kenton Kingfisher  Oxybutynin extended-release tablets What is this medication? OXYBUTYNIN (ox i BYOO ti nin) is used to treat overactive bladder. This medicine reduces the amount of bathroom visits. It may also help to controlwetting accidents. This medicine may be used for other purposes; ask your health care provider orpharmacist if you have questions. COMMON BRAND NAME(S): Ditropan XL What should I tell my care team before I take this medication? They need to know if you have any of these conditions: autonomic neuropathy dementia difficulty passing urine glaucoma intestinal obstruction kidney disease liver disease myasthenia gravis Parkinson's disease an unusual or allergic reaction to oxybutynin, other medicines, foods, dyes, or preservatives pregnant or trying to get pregnant breast-feeding How should I use this medication? Take this medicine by mouth with a glass of water. Swallow whole, do not crush, cut, or chew. Follow the directions on the prescription label. You can take this medicine with or without food. Take your doses at regular intervals. Donot take your medicine more often than directed. Talk to your pediatrician regarding the use of this medicine in children. Special care may be needed. While this drug may be prescribed for children asyoung as 6 years for selected conditions, precautions do apply. Overdosage: If you think you have taken too much of this medicine contact apoison control center or emergency room at once. NOTE: This medicine is only for you. Do not share this medicine with others. What if I miss a dose? If you miss a dose, take it as soon as you can. If it is almost time for yournext dose, take only that dose. Do not take  double or extra doses. What may interact with this medication? antihistamines for allergy, cough and cold atropine certain medicines for bladder problems like oxybutynin, tolterodine certain medicines for Parkinson's disease like benztropine, trihexyphenidyl certain medicines for stomach problems like dicyclomine, hyoscyamine certain medicines for travel sickness like scopolamine clarithromycin erythromycin ipratropium medicines for fungal infections, like fluconazole, itraconazole, ketoconazole or voriconazole This list may not describe all possible interactions. Give your health care provider a list of all the medicines, herbs, non-prescription drugs, or dietary supplements you use. Also tell them if you smoke, drink alcohol, or use illegaldrugs. Some items may interact with your medicine. What should I watch for while using this medication? It may take a few weeks to notice the full benefit from this medicine. You may need to limit your intake tea, coffee, caffeinated sodas, and alcohol.These drinks may make your symptoms worse. You may get drowsy or dizzy. Do not drive, use machinery, or do anything that needs mental alertness until you know how this medicine affects you. Do not stand or sit up quickly, especially if you are an older patient. This reduces the risk of dizzy or fainting spells. Alcohol may interfere with the effect ofthis medicine. Avoid alcoholic drinks. Your mouth may get dry. Chewing sugarless gum or sucking hard candy, and drinking plenty of water may help. Contact your doctor if the problem does notgo away or is severe. This medicine may cause dry eyes and blurred vision. If you wear contact lenses, you may feel some discomfort. Lubricating drops may help. See youreyecare professional if the problem does not go away or is severe. You may notice the shells of  the tablets in your stool from time to time. Thisis normal. Avoid extreme heat. This medicine can cause you to sweat  less than normal. Your body temperature could increase to dangerous levels, which may lead to heatstroke. What side effects may I notice from receiving this medication? Side effects that you should report to your doctor or health care professionalas soon as possible: allergic reactions like skin rash, itching or hives, swelling of the face, lips, or tongue agitation breathing problems confusion fever flushing (reddening of the skin) hallucinations memory loss pain or difficulty passing urine palpitations unusually weak or tired Side effects that usually do not require medical attention (report to yourdoctor or health care professional if they continue or are bothersome): constipation headache sexual difficulties (impotence) This list may not describe all possible side effects. Call your doctor for medical advice about side effects. You may report side effects to FDA at1-800-FDA-1088. Where should I keep my medication? Keep out of the reach of children. Store at room temperature between 15 and 30 degrees C (59 and 86 degrees F). Protect from moisture and humidity. Throw away any unused medicine after theexpiration date. NOTE: This sheet is a summary. It may not cover all possible information. If you have questions about this medicine, talk to your doctor, pharmacist, orhealth care provider.  2022 Elsevier/Gold Standard (2013-03-13 10:57:06)

## 2020-08-17 LAB — CYTOLOGY - PAP
Comment: NEGATIVE
Diagnosis: NEGATIVE
High risk HPV: NEGATIVE

## 2020-08-25 ENCOUNTER — Ambulatory Visit: Payer: Medicare Other | Admitting: Surgery

## 2020-09-06 ENCOUNTER — Other Ambulatory Visit: Payer: Self-pay

## 2020-09-06 ENCOUNTER — Ambulatory Visit (INDEPENDENT_AMBULATORY_CARE_PROVIDER_SITE_OTHER): Payer: Medicare Other | Admitting: Family Medicine

## 2020-09-06 ENCOUNTER — Encounter: Payer: Self-pay | Admitting: Family Medicine

## 2020-09-06 VITALS — BP 123/76 | HR 82 | Temp 98.6°F | Resp 16 | Ht 60.0 in | Wt 139.0 lb

## 2020-09-06 DIAGNOSIS — M545 Low back pain, unspecified: Secondary | ICD-10-CM | POA: Diagnosis not present

## 2020-09-06 DIAGNOSIS — I10 Essential (primary) hypertension: Secondary | ICD-10-CM | POA: Diagnosis not present

## 2020-09-06 DIAGNOSIS — E559 Vitamin D deficiency, unspecified: Secondary | ICD-10-CM

## 2020-09-06 DIAGNOSIS — Z79899 Other long term (current) drug therapy: Secondary | ICD-10-CM

## 2020-09-06 DIAGNOSIS — E782 Mixed hyperlipidemia: Secondary | ICD-10-CM

## 2020-09-06 DIAGNOSIS — F320 Major depressive disorder, single episode, mild: Secondary | ICD-10-CM | POA: Diagnosis not present

## 2020-09-06 DIAGNOSIS — E039 Hypothyroidism, unspecified: Secondary | ICD-10-CM

## 2020-09-06 DIAGNOSIS — E119 Type 2 diabetes mellitus without complications: Secondary | ICD-10-CM

## 2020-09-06 DIAGNOSIS — H353220 Exudative age-related macular degeneration, left eye, stage unspecified: Secondary | ICD-10-CM | POA: Diagnosis not present

## 2020-09-06 LAB — POCT GLYCOSYLATED HEMOGLOBIN (HGB A1C): Hemoglobin A1C: 6.1 % — AB (ref 4.0–5.6)

## 2020-09-06 NOTE — Progress Notes (Signed)
Follow up Visit     Patient: Nicole Young, Female    DOB: 01-07-1941, 80 y.o.   MRN: [id number] Visit Date: 09/06/2020  Today's Provider: [name], MD   Chief Complaint  Patient presents with   Annual Exam   Subjective    Nicole Young is a 80 y.o. female who presents today for her follow up.  She reports consuming a general diet. Home exercise routine includes walking. She generally feels well. She reports sleeping fairly well. She does have additional problems to discuss today.  She has seen cardiology but would like a coronary CTA calcium score ordered. She is excited that she is going to [facility] for 3 weeks next month. Patient saw Dr. Sabra Heck from orthopedics this morning for chronic low back pain and was given a muscle relaxant. She saw cardiology for palpitations and had a work-up.  Work-up was negative. Diabetes Mellitus Type II, Follow-up  Lab Results  Component Value Date   HGBA1C 6.1 (A) 09/06/2020   HGBA1C 6.2 (H) 03/31/2020   HGBA1C 6.1 (A) 11/24/2019   Wt Readings from Last 3 Encounters:  09/06/20 139 lb (63 kg)  08/16/20 138 lb (62.6 kg)  03/23/20 135 lb (61.2 kg)   Last seen for diabetes 6 months ago.  Management since then includes no medication changes. She reports good compliance with treatment. She is not having side effects.  Symptoms: No fatigue No foot ulcerations  No appetite changes No nausea  No paresthesia of the feet  No polydipsia  No polyuria No visual disturbances   No vomiting     Episodes of hypoglycemia? No    Current insulin regiment: none Most Recent Eye Exam: up to date Current exercise: no regular exercise Current diet habits: well balanced  Pertinent Labs: Lab Results  Component Value Date   CHOL 164 03/31/2020   HDL 62 03/31/2020   LDLCALC 82 03/31/2020   TRIG 115 03/31/2020   CHOLHDL 2.6 03/31/2020   Lab Results  Component Value Date   NA 137 03/31/2020   K 4.4 03/31/2020   CREATININE 0.79  03/31/2020   GFRNONAA 83 06/02/2019   GFRAA 96 06/02/2019   GLUCOSE 114 (H) 03/31/2020     ---------------------------------------------------------------------------------------------------      Medications: Outpatient Medications Prior to Visit  Medication Sig   Accu-Chek FastClix Lancets MISC CHECK SUGAR ONCE DAILY. DX: E11.9   Calcium-Vitamin D-Vitamin K (CALCIUM SOFT CHEWS PO) Take by mouth daily.    cholecalciferol (VITAMIN D3) 25 MCG (1000 UT) tablet Take 600 Units by mouth daily.   diazepam (VALIUM) 2 MG tablet Take 1 tablet (2 mg total) by mouth every 12 (twelve) hours as needed for anxiety.   glucose blood (ACCU-CHEK SMARTVIEW) test strip USE TO CHECK BLOOD SUGAR ONCE A DAY   levothyroxine (SYNTHROID) 50 MCG tablet TAKE ONE AND ONE-HALF TABLETS DAILY (Patient taking differently: Take 50 mcg by mouth daily before breakfast.)   Magnesium 250 MG TABS Take 1 tablet by mouth daily.   meloxicam (MOBIC) 15 MG tablet Take 15 mg by mouth daily.   benzonatate (TESSALON) 200 MG capsule Take 1 capsule (200 mg total) by mouth 3 (three) times daily as needed. (Patient not taking: Reported on 09/06/2020)   metFORMIN (GLUCOPHAGE-XR) 500 MG 24 hr tablet TAKE 1 TABLET TWICE A DAY   mometasone (ELOCON) 0.1 % cream APPLY 1 APPLICATION TOPICALLY DAILY   Multiple Vitamin (MULTIVITAMIN) capsule Take 1 capsule by mouth daily.    Multiple  Vitamins-Minerals (PRESERVISION AREDS PO) Take by mouth 2 (two) times daily.   oxybutynin (DITROPAN-XL) 5 MG 24 hr tablet Take 1 tablet (5 mg total) by mouth at bedtime.   pantoprazole (PROTONIX) 20 MG tablet TAKE 1 TABLET DAILY   rosuvastatin (CRESTOR) 20 MG tablet Take 1 tablet (20 mg total) by mouth daily.   sertraline (ZOLOFT) 100 MG tablet TAKE 1 TABLET DAILY   No facility-administered medications prior to visit.    Allergies  Allergen Reactions   Neomycin-Polymyxin-Gramicidin Other (See Comments)    Other Reaction: Other reaction   Farxiga [Dapagliflozin]  Other (See Comments)    Weak and dizzy    Neosporin [Neomycin-Bacitracin Zn-Polymyx] Swelling   Sulfa Antibiotics    Thimerosal Swelling   Zostavax  [Zoster Vaccine Live] Rash    Do Not Administer Zostavax to Pt!    Patient Care Team: Jerrol Banana., MD as PCP - General (Family Medicine) Gabriel Carina Betsey Holiday, MD as Physician Assistant (Endocrinology) Gae Dry, MD as Referring Physician (Obstetrics and Gynecology) Isaias Sakai, MD as Referring Physician (Ophthalmology)  Review of Systems  All other systems reviewed and are negative.  Last hemoglobin A1c Lab Results  Component Value Date   HGBA1C 6.1 (A) 09/06/2020        Objective    Vitals: BP 123/76   Pulse 82   Temp 98.6 F (37 C)   Resp 16   Ht 5' (1.524 m)   Wt 139 lb (63 kg)   BMI 27.15 kg/m  BP Readings from Last 3 Encounters:  09/06/20 123/76  08/16/20 120/80  03/23/20 110/62   Wt Readings from Last 3 Encounters:  09/06/20 139 lb (63 kg)  08/16/20 138 lb (62.6 kg)  03/23/20 135 lb (61.2 kg)      Physical Exam Vitals reviewed.  Constitutional:      Appearance: She is well-developed.  HENT:     Head: Normocephalic and atraumatic.     Right Ear: External ear normal.     Left Ear: External ear normal.     Nose: Nose normal.  Eyes:     General: No scleral icterus.    Conjunctiva/sclera: Conjunctivae normal.  Neck:     Thyroid: No thyromegaly.  Cardiovascular:     Rate and Rhythm: Normal rate and regular rhythm.     Heart sounds: Normal heart sounds.  Pulmonary:     Effort: Pulmonary effort is normal.     Breath sounds: Normal breath sounds.  Abdominal:     Palpations: Abdomen is soft.     Tenderness: There is no guarding or rebound. Negative signs include Murphy's sign and McBurney's sign.  Musculoskeletal:     Right lower leg: No edema.     Left lower leg: No edema.  Skin:    General: Skin is warm and dry.  Neurological:     General: No focal deficit present.     Mental Status:  She is alert and oriented to person, place, and time.     Comments: Normal sensory foot exam  Psychiatric:        Mood and Affect: Mood normal.        Behavior: Behavior normal.        Thought Content: Thought content normal.        Judgment: Judgment normal.     Most recent functional status assessment: In your present state of health, do you have any difficulty performing the following activities: 03/23/2020  Hearing? Y  Comment -  Vision? Y  Difficulty concentrating or making decisions? N  Walking or climbing stairs? N  Dressing or bathing? N  Doing errands, shopping? N  Preparing Food and eating ? -  Using the Toilet? -  In the past six months, have you accidently leaked urine? -  Comment -  Do you have problems with loss of bowel control? -  Comment -  Managing your Medications? -  Managing your Finances? -  Housekeeping or managing your Housekeeping? -  Some recent data might be hidden   Most recent fall risk assessment: Fall Risk  03/23/2020  Falls in the past year? 0  Comment -  Number falls in past yr: 0  Comment -  Injury with Fall? 0  Comment -  Risk for fall due to : -  Follow up Falls evaluation completed    Most recent depression screenings: PHQ 2/9 Scores 09/06/2020 03/23/2020  PHQ - 2 Score 0 0  PHQ- 9 Score 0 1  Exception Documentation - -   Most recent cognitive screening: 6CIT Screen 02/03/2019  What Year? 0 points  What month? 0 points  What time? 0 points  Count back from 20 0 points  Months in reverse 0 points  Repeat phrase 0 points  Total Score 0   Most recent Audit-C alcohol use screening Alcohol Use Disorder Test (AUDIT) 03/23/2020  1. How often do you have a drink containing alcohol? 2  2. How many drinks containing alcohol do you have on a typical day when you are drinking? 0  3. How often do you have six or more drinks on one occasion? 0  AUDIT-C Score 2  Alcohol Brief Interventions/Follow-up AUDIT Score <7 follow-up not indicated    A score of 3 or more in women, and 4 or more in men indicates increased risk for alcohol abuse, EXCEPT if all of the points are from question 1   Results for orders placed or performed in visit on 09/06/20  POCT glycosylated hemoglobin (Hb A1C)  Result Value Ref Range   Hemoglobin A1C 6.1 (A) 4.0 - 5.6 %   HbA1c POC (<> result, manual entry)     HbA1c, POC (prediabetic range)     HbA1c, POC (controlled diabetic range)      Assessment & Plan     Annual wellness visit done today including the all of the following: Reviewed patient's Family Medical History Reviewed and updated list of patient's medical providers Assessment of cognitive impairment was done Assessed patient's functional ability Established a written schedule for health screening services Health Risk Assessent Completed and Reviewed  Exercise Activities and Dietary recommendations  Goals      Prevent falls     Recommend to remove any items from the home that may cause slips or trips.        Immunization History  Administered Date(s) Administered   Fluad Quad(high Dose 65+) 11/29/2019   Influenza, High Dose Seasonal PF 10/17/2016, 11/10/2017, 10/14/2018   Influenza-Unspecified 10/23/2014, 10/14/2018   PFIZER(Purple Top)SARS-COV-2 Vaccination 01/14/2019, 02/04/2019, 11/14/2019   Pneumococcal Conjugate-13 12/16/2013   Pneumococcal Polysaccharide-23 12/23/2015   Tdap 02/14/2013    Health Maintenance  Topic Date Due   Zoster Vaccines- Shingrix (1 of 2) Never done   URINE MICROALBUMIN  10/17/2017   INFLUENZA VACCINE  08/09/2020   FOOT EXAM  09/11/2020   OPHTHALMOLOGY EXAM  11/16/2020   HEMOGLOBIN A1C  03/08/2021   TETANUS/TDAP  02/15/2023   DEXA SCAN  10/14/2023   COVID-19 Vaccine  Completed  PNA vac Low Risk Adult  Completed   HPV VACCINES  Aged Out     Discussed health benefits of physical activity, and encouraged her to engage in regular exercise appropriate for her age and condition.    1.  Type 2 diabetes mellitus without complication, without long-term current use of insulin ([place]) Diabetes followed by endocrinology - POCT glycosylated hemoglobin (Hb A1C)  2. Mixed hyperlipidemia Rosuvastatin 20 - CT CARDIAC SCORING (SELF PAY ONLY); Future - Lipid panel - TSH  3. Essential hypertension  - CT CARDIAC SCORING (SELF PAY ONLY); Future - CBC with Differential/Platelet  4. High risk medication use On long-term PPI - B12 and Folate Panel - Magnesium  5. Vitamin D deficiency  - VITAMIN D 25 Hydroxy (Vit-D Deficiency, Fractures)  6. Mild major depression (HCC) On sertraline  7. Adult hypothyroidism   8. Exudative age-related macular degeneration of left eye, unspecified stage ([place])    No follow-ups on file.     I, [name], MD, have reviewed all documentation for this visit. The documentation on 09/11/20 for the exam, diagnosis, procedures, and orders are all accurate and complete.    Zafirah Vanzee Cranford Mon, MD  [facility] [phone] (phone) [phone] (fax)  [place]

## 2020-09-07 ENCOUNTER — Telehealth: Payer: Self-pay | Admitting: Family Medicine

## 2020-09-07 NOTE — Telephone Encounter (Signed)
Please advise 

## 2020-09-07 NOTE — Telephone Encounter (Signed)
Patient states she requested the wrong test from PCP and would like him to order cta coronary(cardiac) artery test and would like a follow up call when orders are placed.

## 2020-09-10 DIAGNOSIS — Z79899 Other long term (current) drug therapy: Secondary | ICD-10-CM | POA: Diagnosis not present

## 2020-09-10 DIAGNOSIS — E559 Vitamin D deficiency, unspecified: Secondary | ICD-10-CM | POA: Diagnosis not present

## 2020-09-10 DIAGNOSIS — E782 Mixed hyperlipidemia: Secondary | ICD-10-CM | POA: Diagnosis not present

## 2020-09-10 DIAGNOSIS — I10 Essential (primary) hypertension: Secondary | ICD-10-CM | POA: Diagnosis not present

## 2020-09-11 LAB — CBC WITH DIFFERENTIAL/PLATELET
Basophils Absolute: 0.1 10*3/uL (ref 0.0–0.2)
Basos: 1 %
EOS (ABSOLUTE): 0.1 10*3/uL (ref 0.0–0.4)
Eos: 1 %
Hematocrit: 41.3 % (ref 34.0–46.6)
Hemoglobin: 13.4 g/dL (ref 11.1–15.9)
Immature Grans (Abs): 0 10*3/uL (ref 0.0–0.1)
Immature Granulocytes: 1 %
Lymphocytes Absolute: 2.5 10*3/uL (ref 0.7–3.1)
Lymphs: 28 %
MCH: 29.6 pg (ref 26.6–33.0)
MCHC: 32.4 g/dL (ref 31.5–35.7)
MCV: 91 fL (ref 79–97)
Monocytes Absolute: 0.7 10*3/uL (ref 0.1–0.9)
Monocytes: 8 %
Neutrophils Absolute: 5.4 10*3/uL (ref 1.4–7.0)
Neutrophils: 61 %
Platelets: 208 10*3/uL (ref 150–450)
RBC: 4.53 x10E6/uL (ref 3.77–5.28)
RDW: 13.4 % (ref 11.7–15.4)
WBC: 8.9 10*3/uL (ref 3.4–10.8)

## 2020-09-11 LAB — LIPID PANEL
Chol/HDL Ratio: 2.2 ratio (ref 0.0–4.4)
Cholesterol, Total: 131 mg/dL (ref 100–199)
HDL: 59 mg/dL (ref >39)
LDL Chol Calc (NIH): 55 mg/dL (ref 0–99)
Triglycerides: 90 mg/dL (ref 0–149)
VLDL Cholesterol Cal: 17 mg/dL (ref 5–40)

## 2020-09-11 LAB — TSH: TSH: 2.58 u[IU]/mL (ref 0.450–4.500)

## 2020-09-11 LAB — B12 AND FOLATE PANEL
Folate: >20 ng/mL (ref >3.0)
Vitamin B-12: 624 pg/mL (ref 232–1245)

## 2020-09-11 LAB — VITAMIN D 25 HYDROXY (VIT D DEFICIENCY, FRACTURES): Vit D, 25-Hydroxy: 36.1 ng/mL (ref 30.0–100.0)

## 2020-09-11 LAB — MAGNESIUM: Magnesium: 2.1 mg/dL (ref 1.6–2.3)

## 2020-09-22 ENCOUNTER — Other Ambulatory Visit: Payer: Medicare Other

## 2020-10-25 DIAGNOSIS — M545 Low back pain, unspecified: Secondary | ICD-10-CM | POA: Diagnosis not present

## 2020-10-26 ENCOUNTER — Ambulatory Visit: Payer: Medicare Other | Admitting: Obstetrics & Gynecology

## 2020-11-02 DIAGNOSIS — H353221 Exudative age-related macular degeneration, left eye, with active choroidal neovascularization: Secondary | ICD-10-CM | POA: Diagnosis not present

## 2020-11-16 DIAGNOSIS — Z23 Encounter for immunization: Secondary | ICD-10-CM | POA: Diagnosis not present

## 2020-11-22 ENCOUNTER — Other Ambulatory Visit: Payer: Self-pay | Admitting: Family Medicine

## 2020-11-22 DIAGNOSIS — E039 Hypothyroidism, unspecified: Secondary | ICD-10-CM

## 2020-11-22 NOTE — Telephone Encounter (Signed)
Requested medication (s) are due for refill today:   Not sure  Requested medication (s) are on the active medication list:   Yes  Future visit scheduled:   Yes   Last ordered: 11/28/2019 #90, 5 refills  Returned because need clarification on which dose she is to take.   One note on 02/17/2020 she is taking differently 50 mcg daily however the order is for one and one half tablets daily.     Requested Prescriptions  Pending Prescriptions Disp Refills   levothyroxine (SYNTHROID) 50 MCG tablet [Pharmacy Med Name: L-THYROXINE (SYNTHROID) TABS 50MCG] 90 tablet 5    Sig: TAKE ONE AND ONE-HALF TABLETS DAILY     Endocrinology:  Hypothyroid Agents Failed - 11/22/2020 12:44 AM      Failed - TSH needs to be rechecked within 3 months after an abnormal result. Refill until TSH is due.      Passed - TSH in normal range and within 360 days    TSH  Date Value Ref Range Status  09/10/2020 2.580 0.450 - 4.500 uIU/mL Final          Passed - Valid encounter within last 12 months    Recent Outpatient Visits           2 months ago Type 2 diabetes mellitus without complication, without long-term current use of insulin [facility])   [facility] Jerrol Banana., [place]   7 months ago Type 2 diabetes mellitus without complication, without long-term current use of insulin [facility])   [facility] Jerrol Banana., MD   8 months ago Type 2 diabetes mellitus without complication, without long-term current use of insulin [facility])   [facility] Jerrol Banana., MD   12 months ago Type 2 diabetes mellitus without complication, without long-term current use of insulin [facility])   [facility] Jerrol Banana., MD   1 year ago [place], Clearnce Sorrel, [place]       Future Appointments             In 3 months Rosanna Randy, Retia Passe., MD [facility], [place]

## 2020-11-23 NOTE — Telephone Encounter (Signed)
Please Review

## 2020-11-25 DIAGNOSIS — Z23 Encounter for immunization: Secondary | ICD-10-CM | POA: Diagnosis not present

## 2020-11-26 ENCOUNTER — Other Ambulatory Visit: Payer: Self-pay | Admitting: Family Medicine

## 2020-11-26 DIAGNOSIS — E119 Type 2 diabetes mellitus without complications: Secondary | ICD-10-CM

## 2020-11-26 NOTE — Telephone Encounter (Signed)
Requested Prescriptions  Pending Prescriptions Disp Refills  . ACCU-CHEK SMARTVIEW test strip [Pharmacy Med Name: ACCU-CHEK SMARTVIEW TEST STRIP] 100 strip 2    Sig: USE TO [place] A DAY     Endocrinology: Diabetes - Testing Supplies Passed - 11/26/2020  2:01 AM      Passed - Valid encounter within last 12 months    Recent Outpatient Visits          2 months ago Type 2 diabetes mellitus without complication, without long-term current use of insulin [facility])   [facility] Jerrol Banana., MD   7 months ago Type 2 diabetes mellitus without complication, without long-term current use of insulin [facility])   [facility] Jerrol Banana., MD   8 months ago Type 2 diabetes mellitus without complication, without long-term current use of insulin [facility])   [facility] Jerrol Banana., MD   1 year ago Type 2 diabetes mellitus without complication, without long-term current use of insulin [facility])   [facility] Jerrol Banana., MD   1 year ago [place], Clearnce Sorrel, [place]      Future Appointments            In 3 months Rosanna Randy, Retia Passe., MD [facility], [place]

## 2020-12-06 ENCOUNTER — Ambulatory Visit: Payer: Medicare Other | Admitting: Surgery

## 2020-12-08 ENCOUNTER — Ambulatory Visit (INDEPENDENT_AMBULATORY_CARE_PROVIDER_SITE_OTHER): Payer: Medicare Other | Admitting: Dermatology

## 2020-12-08 ENCOUNTER — Other Ambulatory Visit: Payer: Self-pay

## 2020-12-08 DIAGNOSIS — L821 Other seborrheic keratosis: Secondary | ICD-10-CM | POA: Diagnosis not present

## 2020-12-08 DIAGNOSIS — L853 Xerosis cutis: Secondary | ICD-10-CM

## 2020-12-08 DIAGNOSIS — B078 Other viral warts: Secondary | ICD-10-CM

## 2020-12-08 DIAGNOSIS — L578 Other skin changes due to chronic exposure to nonionizing radiation: Secondary | ICD-10-CM

## 2020-12-08 DIAGNOSIS — D225 Melanocytic nevi of trunk: Secondary | ICD-10-CM

## 2020-12-08 DIAGNOSIS — L814 Other melanin hyperpigmentation: Secondary | ICD-10-CM

## 2020-12-08 DIAGNOSIS — D1801 Hemangioma of skin and subcutaneous tissue: Secondary | ICD-10-CM

## 2020-12-08 DIAGNOSIS — Z1283 Encounter for screening for malignant neoplasm of skin: Secondary | ICD-10-CM | POA: Diagnosis not present

## 2020-12-08 DIAGNOSIS — L820 Inflamed seborrheic keratosis: Secondary | ICD-10-CM

## 2020-12-08 DIAGNOSIS — L309 Dermatitis, unspecified: Secondary | ICD-10-CM | POA: Diagnosis not present

## 2020-12-08 DIAGNOSIS — L219 Seborrheic dermatitis, unspecified: Secondary | ICD-10-CM

## 2020-12-08 DIAGNOSIS — D229 Melanocytic nevi, unspecified: Secondary | ICD-10-CM

## 2020-12-08 MED ORDER — HYDROQUINONE 4 % EX CREA: TOPICAL_CREAM | Freq: Two times a day (BID) | CUTANEOUS | 2 refills | Status: DC

## 2020-12-08 NOTE — Patient Instructions (Addendum)
Cryotherapy Aftercare  Wash gently with soap and water everyday.   Apply Vaseline and Band-Aid daily until healed.    Gentle Skin Care Guide  1. Bathe no more than once a day.  2. Avoid bathing in hot water  3. Use a mild soap like Dove, Vanicream, Cetaphil, CeraVe. Can use Lever 2000 or Cetaphil antibacterial soap  4. Use soap only where you need it. On most days, use it under your arms, between your legs, and on your feet. Let the water rinse other areas unless visibly dirty.  5. When you get out of the bath/shower, use a towel to gently blot your skin dry, don't rub it.  6. While your skin is still a little damp, apply a moisturizing cream such as Vanicream, CeraVe, Cetaphil, Eucerin, Sarna lotion or plain Vaseline Jelly. For hands apply Neutrogena [place] Hand Cream or Excipial Hand Cream.  7. Reapply moisturizer any time you start to itch or feel dry.  8. Sometimes using free and clear laundry detergents can be helpful. Fabric softener sheets should be avoided. Downy Free & Gentle liquid, or any liquid fabric softener that is free of dyes and perfumes, it acceptable to use  9. If your doctor has given you prescription creams you may apply moisturizers over them     If You Need Anything After Your Visit  If you have any questions or concerns for your doctor, please call our main line at [phone] and press option 4 to reach your doctor's medical assistant. If no one answers, please leave a voicemail as directed and we will return your call as soon as possible. Messages left after 4 pm will be answered the following business day.   You may also send [place] a message via [place]. We typically respond to MyChart messages within 1-2 business days.  For prescription refills, please ask your pharmacy to contact our office. Our fax number is [phone].  If you have an urgent issue when the clinic is closed that cannot wait until the next business day, you can page your doctor at  the number below.    Please note that while we do our best to be available for urgent issues outside of office hours, we are not available 24/7.   If you have an urgent issue and are unable to reach [place], you may choose to seek medical care at your doctor's office, retail clinic, urgent care center, or emergency room.  If you have a medical emergency, please immediately call 911 or go to the emergency department.  Pager Numbers  - Dr. Nehemiah Massed: [phone]  - Dr. Laurence Ferrari: [phone]  - Dr. Nicole Kindred: [phone]  In the event of inclement weather, please call our main line at [phone] for an update on the status of any delays or closures.  Dermatology Medication Tips: Please keep the boxes that topical medications come in in order to help keep track of the instructions about where and how to use these. Pharmacies typically print the medication instructions only on the boxes and not directly on the medication tubes.   If your medication is too expensive, please contact our office at [phone] option 4 or send [place] a message through [place].   We are unable to tell what your co-pay for medications will be in advance as this is different depending on your insurance coverage. However, we may be able to find a substitute medication at lower cost or fill out paperwork to get insurance to cover a needed medication.   If  a prior authorization is required to get your medication covered by your insurance company, please allow [place] 1-2 business days to complete this process.  Drug prices often vary depending on where the prescription is filled and some pharmacies may offer cheaper prices.  The website www.goodrx.com contains coupons for medications through different pharmacies. The prices here do not account for what the cost may be with help from insurance (it may be cheaper with your insurance), but the website can give you the price if you did not use any insurance.  - You can print the  associated coupon and take it with your prescription to the pharmacy.  - You may also stop by our office during regular business hours and pick up a GoodRx coupon card.  - If you need your prescription sent electronically to a different pharmacy, notify our office through [place] or by phone at [phone] option 4.     Si Usted Necesita Algo Despus de Su Visita  Tambin puede enviarnos un mensaje a travs de Pharmacist, community. Por lo general respondemos a los mensajes de MyChart en el transcurso de 1 a 2 das hbiles.  Para renovar recetas, por favor pida a su farmacia que se ponga en contacto con nuestra oficina. Harland Dingwall de fax es [place] [phone].  Si tiene un asunto urgente cuando la clnica est cerrada y que no puede esperar hasta el siguiente da hbil, puede llamar/localizar a su doctor(a) al nmero que aparece a continuacin.   Por favor, tenga en cuenta que aunque hacemos todo lo posible para estar disponibles para asuntos urgentes fuera del horario de [place], no estamos disponibles las 24 horas del da, los 7 das de la [place].   Si tiene un problema urgente y no puede comunicarse con nosotros, puede optar por buscar atencin mdica  en el consultorio de su doctor(a), en una clnica privada, en un centro de atencin urgente o en una sala de emergencias.  Si tiene Engineering geologist, por favor llame inmediatamente al 911 o vaya a la sala de emergencias.  Nmeros de bper  - Dr. Nehemiah Massed: [phone]  - Dra. Moye: [phone]  - Dra. Nicole Kindred: [phone]  En caso de inclemencias del [place], por favor llame a Johnsie Kindred principal al [phone] para una actualizacin sobre el [place] de cualquier retraso o cierre.  Consejos para la medicacin en dermatologa: Por favor, guarde las cajas en las que vienen los medicamentos de uso tpico para ayudarle a seguir las instrucciones sobre dnde y cmo usarlos. Las farmacias generalmente imprimen las instrucciones  del medicamento slo en las cajas y no directamente en los tubos del [place].   Si su medicamento es muy caro, por favor, pngase en contacto con Zigmund Daniel llamando al [phone] y presione la opcin 4 o envenos un mensaje a travs de Pharmacist, community.   No podemos decirle cul ser su copago por los medicamentos por adelantado ya que esto es diferente dependiendo de la cobertura de su seguro. Sin embargo, es posible que podamos encontrar un medicamento sustituto a Electrical engineer un formulario para que el seguro cubra el medicamento que se considera necesario.   Si se requiere una autorizacin previa para que su compaa de seguros [place] su medicamento, por favor permtanos de 1 a 2 das hbiles para completar este proceso.  Los precios de los medicamentos varan con frecuencia dependiendo del Environmental consultant de dnde se surte la receta y alguna farmacias pueden ofrecer precios ms baratos.  El sitio web www.goodrx.com tiene  diferentes farmacias. Los precios aqu no tienen en cuenta lo que podra costar con la ayuda del seguro (puede ser ms barato con su seguro), pero el sitio web puede darle el precio si no utiliz ningn seguro.  - Puede imprimir el cupn correspondiente y llevarlo con su receta a la farmacia.  - Tambin puede pasar por nuestra oficina durante el horario de atencin regular y recoger una tarjeta de cupones de GoodRx.  - Si necesita que su receta se enve electrnicamente a una farmacia diferente, informe a nuestra oficina a travs de MyChart de [place] o por telfono llamando al [phone] y presione la opcin 4.  

## 2020-12-08 NOTE — Progress Notes (Signed)
Follow-Up Visit   Subjective  Nicole Young is a 80 y.o. female who presents for the following: TBSE (Total body today. No hx of skin cancer or dysplastic nevi. Pt has a spot on her right thumb tip that she would like looked at. She would also like to discuss the dryness on her hands. ).  She also has a couple itchy growths that she would like removed.  Patient here for full body skin exam and skin cancer screening.   The following portions of the chart were reviewed this encounter and updated as appropriate:      Review of Systems: No other skin or systemic complaints except as noted in HPI or Assessment and Plan.   Objective  Well appearing patient in no apparent distress; mood and affect are within normal limits.  A full examination was performed including scalp, head, eyes, ears, nose, lips, neck, chest, axillae, abdomen, back, buttocks, bilateral upper extremities, bilateral lower extremities, hands, feet, fingers, toes, fingernails, and toenails. All findings within normal limits unless otherwise noted below.  right thumb tip 3 mm firm depressed papule  Right Hand - Anterior Light pink scaly patch on right wrist and hand dorsum  Mid Back 3 mm brown macule darker inferior, 4 mm medium brown macule left ant thigh  Right Posterior Upper Arm x 1, right flank x 1 (2) Erythematous keratotic or waxy stuck-on papule  right temple and zygoma Waxy tan patches  Assessment & Plan  Other viral warts right thumb tip  Discussed viral etiology and risk of spread.  Discussed multiple treatments may be required to clear warts.  Discussed possible post-treatment dyspigmentation and risk of recurrence.   Destruction of lesion - right thumb tip  Destruction method: cryotherapy   Informed consent: discussed and consent obtained   Lesion destroyed using liquid nitrogen: Yes   Region frozen until ice ball extended beyond lesion: Yes   Outcome: patient tolerated procedure well  with no complications   Post-procedure details: wound care instructions given   Additional details:  Prior to procedure, discussed risks of blister formation, small wound, skin dyspigmentation, or rare scar following cryotherapy. Recommend Vaseline ointment to treated areas while healing.   Hand dermatitis Right Hand - Anterior  With flare Start mometasone cream. Apply 1-2 times daily until itching and redness clears up. Then use prn flares.   Topical steroids (such as triamcinolone, fluocinolone, fluocinonide, mometasone, clobetasol, halobetasol, betamethasone, hydrocortisone) can cause thinning and lightening of the skin if they are used for too long in the same area. Your physician has selected the right strength medicine for your problem and area affected on the body. Please use your medication only as directed by your physician to prevent side effects.   Hand Dermatitis is a chronic type of eczema that can come and go on the hands and fingers.  While there is no cure, the rash and symptoms can be managed with topical prescription medications, and for more severe cases, with systemic medications.  Recommend mild soap and routine use of moisturizing cream after handwashing.  Minimize soap/water exposure when possible.   Nevus Mid Back  Benign-appearing.  Observation.  Call clinic for new or changing moles.  Recommend daily use of broad spectrum spf 30+ sunscreen to sun-exposed areas.    Inflamed seborrheic keratosis Right Posterior Upper Arm x 1, right flank x 1  Prior to procedure, discussed risks of blister formation, small wound, skin dyspigmentation, or rare scar following cryotherapy. Recommend Vaseline ointment to treated  areas while healing.   Destruction of lesion - Right Posterior Upper Arm x 1, right flank x 1  Destruction method: cryotherapy   Informed consent: discussed and consent obtained   Lesion destroyed using liquid nitrogen: Yes   Region frozen until ice ball  extended beyond lesion: Yes   Outcome: patient tolerated procedure well with no complications   Post-procedure details: wound care instructions given    Seborrheic keratosis right temple and zygoma  Reassured benign age-related growth.  Recommend observation.  Discussed cryotherapy if spot(s) become irritated or inflamed.  Pt wanted something to lighten spots.   start hydroquinone 4% cream to affected area BID Sunscreen spf 30 qam  hydroquinone 4 % cream - right temple and zygoma Apply topically 2 (two) times daily.  Lentigines - Scattered tan macules - Due to sun exposure - Benign-appearing, observe - Recommend daily broad spectrum sunscreen SPF 30+ to sun-exposed areas, reapply every 2 hours as needed. - Call for any changes  Seborrheic Keratoses - Stuck-on, waxy, tan-brown papules and/or plaques  - Benign-appearing - Discussed benign etiology and prognosis. - Observe - Call for any changes  Melanocytic Nevi - Tan-brown and/or pink-flesh-colored symmetric macules and papules - Benign appearing on exam today - Observation - Call clinic for new or changing moles - Recommend daily use of broad spectrum spf 30+ sunscreen to sun-exposed areas.   Hemangiomas - Red papules - Discussed benign nature - Observe - Call for any changes  Xerosis Right medial ankle - diffuse xerotic patches - recommend gentle, hydrating skin care - gentle skin care handout given   Actinic Damage - Chronic condition, secondary to cumulative UV/sun exposure - diffuse scaly erythematous macules with underlying dyspigmentation - Recommend daily broad spectrum sunscreen SPF 30+ to sun-exposed areas, reapply every 2 hours as needed.  - Staying in the shade or wearing long sleeves, sun glasses (UVA+UVB protection) and wide brim hats (4-inch brim around the entire circumference of the hat) are also recommended for sun protection.  - Call for new or changing lesions.  Skin cancer screening  performed today.   Return in about 1 month (around 01/07/2021) for 1 mo wart f/u.  I, Harriett Sine, CMA, am acting as scribe for Brendolyn Patty, MD.  Documentation: I have reviewed the above documentation for accuracy and completeness, and I agree with the above.  Brendolyn Patty MD

## 2021-01-03 ENCOUNTER — Other Ambulatory Visit: Payer: Self-pay | Admitting: Family Medicine

## 2021-01-03 DIAGNOSIS — F320 Major depressive disorder, single episode, mild: Secondary | ICD-10-CM

## 2021-01-04 NOTE — Telephone Encounter (Signed)
Requested Prescriptions  Pending Prescriptions Disp Refills   sertraline (ZOLOFT) 100 MG tablet [Pharmacy Med Name: SERTRALINE HCL TABS 100MG ] 90 tablet 3    Sig: TAKE 1 TABLET DAILY     Psychiatry:  Antidepressants - SSRI Passed - 01/03/2021 12:48 AM      Passed - Completed PHQ-2 or PHQ-9 in the last 360 days      Passed - Valid encounter within last 6 months    Recent Outpatient Visits          4 months ago Type 2 diabetes mellitus without complication, without long-term current use of insulin [facility])   [facility] Jerrol Banana., MD   8 months ago Type 2 diabetes mellitus without complication, without long-term current use of insulin [facility])   [facility] Jerrol Banana., MD   9 months ago Type 2 diabetes mellitus without complication, without long-term current use of insulin [facility])   [facility]   1 year ago Type 2 diabetes mellitus without complication, without long-term current use of insulin [facility])   [facility] Jerrol Banana., MD   1 year ago [place], Clearnce Sorrel, [place]      Future Appointments            In 1 week Brendolyn Patty, MD [place]   In 2 months Jerrol Banana., MD [facility], [place]

## 2021-01-11 ENCOUNTER — Ambulatory Visit (INDEPENDENT_AMBULATORY_CARE_PROVIDER_SITE_OTHER): Payer: Medicare Other | Admitting: Dermatology

## 2021-01-11 ENCOUNTER — Other Ambulatory Visit: Payer: Self-pay

## 2021-01-11 DIAGNOSIS — B079 Viral wart, unspecified: Secondary | ICD-10-CM

## 2021-01-11 NOTE — Progress Notes (Signed)
° °  Follow-Up Visit   Subjective  Nicole Young is a 81 y.o. female who presents for the following: Follow-up (Patient here today for follow up on a wart at  right tip of thumb. Patient reports a lot of skin peeled away but still has a spot left. ).  The following portions of the chart were reviewed this encounter and updated as appropriate:      Review of Systems: No other skin or systemic complaints except as noted in HPI or Assessment and Plan.   Objective  Well appearing patient in no apparent distress; mood and affect are within normal limits.  A focused examination was performed including right tip of thumb. Relevant physical exam findings are noted in the Assessment and Plan.  Right Thumb Tip x 1 3 mm keratotic depressed macule on right tip of thumb with thinning   Assessment & Plan  Viral warts, unspecified type Right Thumb Tip x 1  Improving  Discussed viral etiology and risk of spread.  Discussed multiple treatments may be required to clear warts.  Discussed possible post-treatment dyspigmentation and risk of recurrence.  Destruction of lesion - Right Thumb Tip x 1  Destruction method: cryotherapy   Informed consent: discussed and consent obtained   Lesion destroyed using liquid nitrogen: Yes   Region frozen until ice ball extended beyond lesion: Yes   Outcome: patient tolerated procedure well with no complications   Post-procedure details: wound care instructions given   Additional details:  Prior to procedure, discussed risks of blister formation, small wound, skin dyspigmentation, or rare scar following cryotherapy. Recommend Vaseline ointment to treated areas while healing.    Return for 1 month wart follow up.  I, Ruthell Rummage, CMA, am acting as scribe for Brendolyn Patty, MD.  Documentation: I have reviewed the above documentation for accuracy and completeness, and I agree with the above.  Brendolyn Patty MD

## 2021-01-11 NOTE — Patient Instructions (Signed)

## 2021-01-17 DIAGNOSIS — E119 Type 2 diabetes mellitus without complications: Secondary | ICD-10-CM | POA: Diagnosis not present

## 2021-01-17 DIAGNOSIS — E890 Postprocedural hypothyroidism: Secondary | ICD-10-CM | POA: Diagnosis not present

## 2021-01-18 DIAGNOSIS — H353112 Nonexudative age-related macular degeneration, right eye, intermediate dry stage: Secondary | ICD-10-CM | POA: Diagnosis not present

## 2021-01-18 DIAGNOSIS — H353221 Exudative age-related macular degeneration, left eye, with active choroidal neovascularization: Secondary | ICD-10-CM | POA: Diagnosis not present

## 2021-01-24 DIAGNOSIS — E1142 Type 2 diabetes mellitus with diabetic polyneuropathy: Secondary | ICD-10-CM | POA: Diagnosis not present

## 2021-01-24 DIAGNOSIS — E039 Hypothyroidism, unspecified: Secondary | ICD-10-CM | POA: Diagnosis not present

## 2021-02-09 DIAGNOSIS — I493 Ventricular premature depolarization: Secondary | ICD-10-CM | POA: Diagnosis not present

## 2021-02-09 DIAGNOSIS — E7800 Pure hypercholesterolemia, unspecified: Secondary | ICD-10-CM | POA: Diagnosis not present

## 2021-02-09 DIAGNOSIS — E119 Type 2 diabetes mellitus without complications: Secondary | ICD-10-CM | POA: Diagnosis not present

## 2021-02-09 DIAGNOSIS — R002 Palpitations: Secondary | ICD-10-CM | POA: Diagnosis not present

## 2021-02-09 DIAGNOSIS — Z23 Encounter for immunization: Secondary | ICD-10-CM | POA: Diagnosis not present

## 2021-02-15 ENCOUNTER — Other Ambulatory Visit: Payer: Self-pay

## 2021-02-15 ENCOUNTER — Ambulatory Visit (INDEPENDENT_AMBULATORY_CARE_PROVIDER_SITE_OTHER): Payer: Medicare Other | Admitting: Dermatology

## 2021-02-15 DIAGNOSIS — H60543 Acute eczematoid otitis externa, bilateral: Secondary | ICD-10-CM

## 2021-02-15 DIAGNOSIS — L309 Dermatitis, unspecified: Secondary | ICD-10-CM

## 2021-02-15 DIAGNOSIS — B079 Viral wart, unspecified: Secondary | ICD-10-CM

## 2021-02-15 MED ORDER — MOMETASONE FUROATE 0.1 % EX CREA: 1.0000 "application " | TOPICAL_CREAM | CUTANEOUS | 1 refills | Status: DC

## 2021-02-15 NOTE — Patient Instructions (Addendum)
Cantharidin Plus is a blistering agent that comes from a beetle.  It needs to be washed off in about 4 hours after application.  Although it is painless when applied in office, it may cause symptoms of mild pain and burning several hours later.  Treated areas will swell and turn red, and blisters may form.  Vaseline and a bandaid may be applied until wound has healed.  Once healed, the skin may remain temporarily discolored.  It can take weeks to months for pigmentation to return to normal.  Advised to wash off with soap and water in 4 hours or sooner if it becomes tender before then.    Topical steroids (such as triamcinolone, fluocinolone, fluocinonide, mometasone, clobetasol, halobetasol, betamethasone, hydrocortisone) can cause thinning and lightening of the skin if they are used for too long in the same area. Your physician has selected the right strength medicine for your problem and area affected on the body. Please use your medication only as directed by your physician to prevent side effects.     If You Need Anything After Your Visit  If you have any questions or concerns for your doctor, please call our main line at [phone] and press option 4 to reach your doctor's medical assistant. If no one answers, please leave a voicemail as directed and we will return your call as soon as possible. Messages left after 4 pm will be answered the following business day.   You may also send [place] a message via [place]. We typically respond to MyChart messages within 1-2 business days.  For prescription refills, please ask your pharmacy to contact our office. Our fax number is [phone].  If you have an urgent issue when the clinic is closed that cannot wait until the next business day, you can page your doctor at the number below.    Please note that while we do our best to be available for urgent issues outside of office hours, we are not available 24/7.   If you have an urgent issue and are  unable to reach [place], you may choose to seek medical care at your doctor's office, retail clinic, urgent care center, or emergency room.  If you have a medical emergency, please immediately call 911 or go to the emergency department.  Pager Numbers  - Dr. Nehemiah Massed: [phone]  - Dr. Laurence Ferrari: [phone]  - Dr. Nicole Kindred: [phone]  In the event of inclement weather, please call our main line at [phone] for an update on the status of any delays or closures.  Dermatology Medication Tips: Please keep the boxes that topical medications come in in order to help keep track of the instructions about where and how to use these. Pharmacies typically print the medication instructions only on the boxes and not directly on the medication tubes.   If your medication is too expensive, please contact our office at [phone] option 4 or send [place] a message through [place].   We are unable to tell what your co-pay for medications will be in advance as this is different depending on your insurance coverage. However, we may be able to find a substitute medication at lower cost or fill out paperwork to get insurance to cover a needed medication.   If a prior authorization is required to get your medication covered by your insurance company, please allow [place] 1-2 business days to complete this process.  Drug prices often vary depending on where the prescription is filled and some pharmacies may offer cheaper prices.  The website www.goodrx.com contains coupons for medications through different pharmacies. The prices here do not account for what the cost may be with help from insurance (it may be cheaper with your insurance), but the website can give you the price if you did not use any insurance.  - You can print the associated coupon and take it with your prescription to the pharmacy.  - You may also stop by our office during regular business hours and pick up a GoodRx coupon card.  - If you need your  prescription sent electronically to a different pharmacy, notify our office through [facility] or by phone at [phone] option 4.     Si Usted Necesita Algo Despus de Su Visita  Tambin puede enviarnos un mensaje a travs de Pharmacist, community. Por lo general respondemos a los mensajes de MyChart en el transcurso de 1 a 2 das hbiles.  Para renovar recetas, por favor pida a su farmacia que se ponga en contacto con nuestra oficina. Harland Dingwall de fax es [place] [phone].  Si tiene un asunto urgente cuando la clnica est cerrada y que no puede esperar hasta el siguiente da hbil, puede llamar/localizar a su doctor(a) al nmero que aparece a continuacin.   Por favor, tenga en cuenta que aunque hacemos todo lo posible para estar disponibles para asuntos urgentes fuera del horario de [place], no estamos disponibles las 24 horas del da, los 7 das de la [place].   Si tiene un problema urgente y no puede comunicarse con nosotros, puede optar por buscar atencin mdica  en el consultorio de su doctor(a), en una clnica privada, en un centro de atencin urgente o en una sala de emergencias.  Si tiene Engineering geologist, por favor llame inmediatamente al 911 o vaya a la sala de emergencias.  Nmeros de bper  - Dr. Nehemiah Massed: [phone]  - Dra. Moye: [phone]  - Dra. Nicole Kindred: [phone]  En caso de inclemencias del [place], por favor llame a Johnsie Kindred principal al [phone] para una actualizacin sobre el [place] de cualquier retraso o cierre.  Consejos para la medicacin en dermatologa: Por favor, guarde las cajas en las que vienen los medicamentos de uso tpico para ayudarle a seguir las instrucciones sobre dnde y cmo usarlos. Las farmacias generalmente imprimen las instrucciones del medicamento slo en las cajas y no directamente en los tubos del [place].   Si su medicamento es muy caro, por favor, pngase en contacto con Zigmund Daniel llamando al [phone]  y presione la opcin 4 o envenos un mensaje a travs de Pharmacist, community.   No podemos decirle cul ser su copago por los medicamentos por adelantado ya que esto es diferente dependiendo de la cobertura de su seguro. Sin embargo, es posible que podamos encontrar un medicamento sustituto a Electrical engineer un formulario para que el seguro cubra el medicamento que se considera necesario.   Si se requiere una autorizacin previa para que su compaa de seguros [place] su medicamento, por favor permtanos de 1 a 2 das hbiles para completar este proceso.  Los precios de los medicamentos varan con frecuencia dependiendo del Environmental consultant de dnde se surte la receta y alguna farmacias pueden ofrecer precios ms baratos.  El sitio web www.goodrx.com tiene cupones para medicamentos de Airline pilot. Los precios aqu no tienen en cuenta lo que podra costar con la ayuda del seguro (puede ser ms barato con su seguro), pero el sitio web puede darle el precio si no utiliz Research scientist (physical sciences).  -  Puede imprimir el cupn correspondiente y llevarlo con su receta a la farmacia.  - Tambin puede pasar por nuestra oficina durante el horario de atencin regular y Charity fundraiser una tarjeta de cupones de GoodRx.  - Si necesita que su receta se enve electrnicamente a una farmacia diferente, informe a nuestra oficina a travs de MyChart de [place] o por telfono llamando al [phone] y presione la opcin 4.

## 2021-02-15 NOTE — Progress Notes (Signed)
Follow-Up Visit   Subjective  Nicole Young is a 81 y.o. female who presents for the following: Follow-up (Patient here today for 1 month wart follow up at right thumb tip. Patient reports that area is still there. Patient also would like to discuss hand dermatitis.).  She never got the Rx for mometasone cream.  She tried OTC HC cream which helped temporarily, but rash came right back.   The following portions of the chart were reviewed this encounter and updated as appropriate:      Review of Systems: No other skin or systemic complaints except as noted in HPI or Assessment and Plan.   Objective  Well appearing patient in no apparent distress; mood and affect are within normal limits.  A focused examination was performed including hands. Relevant physical exam findings are noted in the Assessment and Plan.  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 Condition is bothersome to patient. Currently flared.   Start mometasone cream. Apply 1-2 times daily until itching and redness clears up. Then use prn flares.   Recommend Aveeno balm nightly before bed to help with dryness   Topical steroids (such as triamcinolone, fluocinolone, fluocinonide, mometasone, clobetasol, halobetasol, betamethasone, hydrocortisone) can cause thinning and lightening of the skin if they are used for too long in the same area. Your physician has selected the right strength medicine for your problem and area affected on the body. Please use your medication only as directed by your physician to prevent side effects.  Hand Dermatitis is a chronic type of eczema that can come and go on the hands and fingers.  While there is no cure, the rash and symptoms can be managed with topical prescription medications, and  for more severe cases, with systemic medications.  Recommend mild soap and routine use of moisturizing cream after handwashing.  Minimize soap/water exposure when possible.     Eczema of external ear, bilateral  Related Medications mometasone (ELOCON) 0.1 % cream Apply 1 application topically See admin instructions. Use 1 - 2 times daily until clear, then use as a spot treatment when needed  Viral warts, unspecified type right thumb tip x 1  Has improved with thinning   Discussed viral etiology and risk of spread.  Discussed multiple treatments may be required to clear warts.  Discussed possible post-treatment dyspigmentation and risk of recurrence.     Destruction of lesion - right thumb tip x 1  Destruction method: cryotherapy   Informed consent: discussed and consent obtained   Lesion destroyed using liquid nitrogen: Yes   Region frozen until ice ball extended beyond lesion: Yes   Outcome: patient tolerated procedure well with no complications   Post-procedure details: wound care instructions given   Additional details:  Prior to procedure, discussed risks of blister formation, small wound, skin dyspigmentation, or rare scar following cryotherapy. Recommend Vaseline ointment to treated areas while healing.   Destruction of lesion - right thumb tip x 1  Destruction method: chemical removal   Informed consent: discussed and consent obtained   Timeout:  patient name, date of birth, surgical site, and procedure verified Chemical destruction method: cantharidin   Chemical destruction method comment:  Plus Application time:  4 hours Procedure instructions: patient instructed to wash and dry area   Outcome: patient  tolerated procedure well with no complications   Post-procedure details: wound care instructions given   Additional details:  Cantharidin Plus is a blistering agent that comes from a beetle.  It needs to be washed off in about 4 hours after application.  Although it is  painless when applied in office, it may cause symptoms of mild pain and burning several hours later.  Treated areas will swell and turn red, and blisters may form.  Vaseline and a bandaid may be applied until wound has healed.  Once healed, the skin may remain temporarily discolored.  It can take weeks to months for pigmentation to return to normal.  Advised to wash off with soap and water in 4 hours or sooner if it becomes tender before then.    Return for 1 month wart follow up.  I, Ruthell Rummage, CMA, am acting as scribe for Brendolyn Patty, MD.  Documentation: I have reviewed the above documentation for accuracy and completeness, and I agree with the above.  Brendolyn Patty MD

## 2021-02-22 ENCOUNTER — Ambulatory Visit (INDEPENDENT_AMBULATORY_CARE_PROVIDER_SITE_OTHER): Payer: Medicare Other

## 2021-02-22 ENCOUNTER — Other Ambulatory Visit: Payer: Self-pay

## 2021-02-22 ENCOUNTER — Encounter: Payer: Self-pay | Admitting: Physician Assistant

## 2021-02-22 ENCOUNTER — Ambulatory Visit (INDEPENDENT_AMBULATORY_CARE_PROVIDER_SITE_OTHER): Payer: Medicare Other | Admitting: Physician Assistant

## 2021-02-22 VITALS — Wt 139.2 lb

## 2021-02-22 VITALS — BP 116/57 | HR 81 | Temp 98.1°F | Ht 60.0 in | Wt 139.2 lb

## 2021-02-22 DIAGNOSIS — Z0000 Encounter for general adult medical examination without abnormal findings: Secondary | ICD-10-CM

## 2021-02-22 DIAGNOSIS — J209 Acute bronchitis, unspecified: Secondary | ICD-10-CM | POA: Diagnosis not present

## 2021-02-22 MED ORDER — CHERATUSSIN AC 100-10 MG/5ML PO SOLN: 5.0000 mL | Freq: Every evening | ORAL | 0 refills | Status: DC | PRN

## 2021-02-22 NOTE — Progress Notes (Signed)
Subjective:   Nicole Young is a 81 y.o. female who presents for Medicare Annual (Subsequent) preventive examination.  Review of Systems           Objective:    Today's Vitals   02/22/21 1023  Weight: 139 lb 3.2 oz (63.1 kg)   Body mass index is 27.19 kg/m.  Advanced Directives 02/17/2020 02/03/2019 02/08/2018 01/22/2017 12/16/2015 12/16/2015 12/07/2015  Does Patient Have a Medical Advance Directive? Yes Yes Yes Yes No No No  Type of [place] administrator Power of [employer] Power of Attorney - - -  Does patient want to make changes to medical advance directive? - - - - - - -  Copy of [place] in Chart? No - copy requested No - copy requested No - copy requested No - copy requested - - -  Would patient like information on creating a medical advance directive? - - - - - No - Patient declined -    Current Medications (verified) Outpatient Encounter Medications as of 02/22/2021  Medication Sig   Accu-Chek FastClix Lancets MISC CHECK SUGAR ONCE DAILY. DX: E11.9   ACCU-CHEK SMARTVIEW test strip USE TO CHECK BLOOD SUGAR ONCE A DAY   benzonatate (TESSALON) 200 MG capsule Take 1 capsule (200 mg total) by mouth 3 (three) times daily as needed. (Patient not taking: Reported on 02/15/2021)   Calcium-Vitamin D-Vitamin K (CALCIUM SOFT CHEWS PO) Take by mouth daily.    celecoxib (CELEBREX) 200 MG capsule Take 200 mg by mouth daily.   cholecalciferol (VITAMIN D3) 25 MCG (1000 UT) tablet Take 600 Units by mouth daily.   diazepam (VALIUM) 2 MG tablet Take 1 tablet (2 mg total) by mouth every 12 (twelve) hours as needed for anxiety.   hydroquinone 4 % cream Apply topically 2 (two) times daily.   levothyroxine (SYNTHROID) 50 MCG tablet TAKE ONE AND ONE-HALF TABLETS DAILY   Magnesium 250 MG TABS Take 1 tablet by mouth daily.   meloxicam (MOBIC) 15 MG tablet Take 15 mg by mouth daily.   metFORMIN  (GLUCOPHAGE-XR) 500 MG 24 hr tablet TAKE 1 TABLET TWICE A DAY   methocarbamol (ROBAXIN) 500 MG tablet Robaxin 500 mg tablet  Take 1 tablet twice a day by oral route.   mometasone (ELOCON) 0.1 % cream Apply 1 application topically See admin instructions. Use 1 - 2 times daily until clear, then use as a spot treatment when needed   Multiple Vitamin (MULTIVITAMIN) capsule Take 1 capsule by mouth daily.    Multiple Vitamins-Minerals (PRESERVISION AREDS PO) Take by mouth 2 (two) times daily.   oxybutynin (DITROPAN-XL) 5 MG 24 hr tablet Take 1 tablet (5 mg total) by mouth at bedtime.   pantoprazole (PROTONIX) 20 MG tablet TAKE 1 TABLET DAILY   pantoprazole (PROTONIX) 20 MG tablet Take by mouth.   rosuvastatin (CRESTOR) 20 MG tablet Take 1 tablet (20 mg total) by mouth daily.   sertraline (ZOLOFT) 100 MG tablet TAKE 1 TABLET DAILY   No facility-administered encounter medications on file as of 02/22/2021.    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[place])    Past Surgical History:  Procedure Laterality Date   AUGMENTATION MAMMAPLASTY  1975   AUGMENTATION MAMMAPLASTY  2017   2nd augmentation and lift   BREAST SURGERY     impants replaced with lift   CARPAL TUNNEL RELEASE Right 08/26/2014   Procedure: CARPAL TUNNEL RELEASE;  Surgeon: Earnestine Leys, MD;  Location: [facility] ORS;  Service: Orthopedics;  Laterality: Right;    COLONOSCOPY  2005   Dr Bary Castilla   COLONOSCOPY N/A 06/24/2014   Procedure: COLONOSCOPY;  Surgeon: Robert Bellow, MD;  Location: [facility] ENDOSCOPY;  Service: Endoscopy;  Laterality: N/A;   PARATHYROIDECTOMY  1992   PLACEMENT OF BREAST IMPLANTS  1975   TONSILECTOMY, ADENOIDECTOMY, BILATERAL MYRINGOTOMY AND TUBES     Family History  Problem Relation Age of Onset   Cancer Father        prostate   Prostate cancer Father    Hypertension Mother    Heart attack Mother    Heart attack Maternal Grandfather    Diabetes Paternal Uncle    Congestive Heart Failure Maternal Grandmother    CVA Paternal Grandmother    Prostate cancer Paternal Grandfather    Breast cancer Maternal Aunt    Social History   Socioeconomic History   Marital status: Widowed    Spouse name: Not on file   Number of children: 3   Years of education: Not on file   Highest education level: Bachelor's degree (e.g., BA, AB, BS)  Occupational History   Occupation: retired  Tobacco Use   Smoking status: Never   Smokeless tobacco: Never  Vaping Use   Vaping Use: Never used  Substance and Sexual Activity   Alcohol use: Yes    Alcohol/week: 1.0 standard drink    Types: 1 Glasses of wine per week   Drug use: No   Sexual activity: Not on file  Other Topics Concern   Not on file  Social History Narrative   Not on file   Social Determinants of Health   Financial Resource Strain: Not on file  Food Insecurity: Not on file  Transportation Needs: Not on file  Physical Activity: Not on file  Stress: Not on file  Social Connections: Not on file    Tobacco Counseling Counseling given: Not Answered   Clinical Intake:  Pre-visit preparation completed: Yes  Pain : No/denies pain     Nutritional Risks: None Diabetes: Yes CBG done?: No Did pt. bring in CBG monitor from home?: No  How often do you need to have someone help you when you read instructions, pamphlets, or other written materials from your doctor or  pharmacy?: 1 - Never  Diabetic?yes Nutrition Risk Assessment:  Has the patient had any N/V/D within the last 2 months?  No  Does the patient have any non-healing wounds?  No  Has the patient had any unintentional weight loss or weight gain?  No   Diabetes:  Is the patient diabetic?  Yes  If diabetic, was a CBG obtained today?  No  Did the patient bring in their glucometer from home?  No  How often do you monitor your CBG's? Once a day.   Financial Strains and Diabetes Management:  Are you having any financial strains with the device,  your supplies or your medication? No .  Does the patient want to be seen by Chronic Care Management for management of their diabetes?  No  Would the patient like to be referred to a Nutritionist or for Diabetic Management?  No   Diabetic Exams:  Diabetic Eye Exam: Completed 11/17/19. Overdue for diabetic eye exam. Pt has been advised about the importance in completing this exam.  Diabetic Foot Exam: Completed 09/12/19. Pt has been advised about the importance in completing this exam.   Interpreter Needed?: No  Information entered by :: Kirke Shaggy, LPN   Activities of Daily Living In your present state of health, do you have any difficulty performing the following activities: 03/23/2020  Hearing? Y  Vision? Y  Difficulty concentrating or making decisions? N  Walking or climbing stairs? N  Dressing or bathing? N  Doing errands, shopping? N  Some recent data might be hidden    Patient Care Team: Jerrol Banana., MD as PCP - General (Family Medicine) Gabriel Carina, Betsey Holiday, MD as Physician Assistant (Endocrinology) Gae Dry, MD as Referring Physician (Obstetrics and Gynecology) Isaias Sakai, MD as Referring Physician (Ophthalmology)  Indicate any recent Medical Services you may have received from other than [facility] providers in the past year (date may be approximate).     Assessment:   This is a routine wellness examination for  [place].  Hearing/Vision screen No results found.  Dietary issues and exercise activities discussed:     Goals Addressed   None    Depression Screen PHQ 2/9 Scores 09/06/2020 03/23/2020 02/17/2020 11/24/2019 02/03/2019 12/11/2018 03/19/2018  PHQ - 2 Score 0 0 0 0 0 0 0  PHQ- 9 Score 0 1 - 0 - 0 -  Exception Documentation - - - - - - Patient refusal    Fall Risk Fall Risk  03/23/2020 02/17/2020 11/24/2019 02/03/2019 12/11/2018  Falls in the past year? 0 1 1 0 0  Comment - Roller skating accident. - - -  Number falls in past yr: 0 0 0 0 0  Comment - - - - -  Injury with Fall? 0 0 0 0 0  Comment - - - - -  Risk for fall due to : - - No Fall Risks - -  Follow up Falls evaluation completed Falls prevention discussed Falls evaluation completed - Falls evaluation completed    [place]:  Any stairs in or around the home? No  If so, are there any without handrails? No  Home free of loose throw rugs in walkways, pet beds, electrical cords, etc? Yes  Adequate lighting in your home to reduce risk of falls? Yes   ASSISTIVE DEVICES UTILIZED TO PREVENT FALLS:  Life alert? No  Use of a cane, walker or w/c? No  Grab bars in the bathroom? Yes  Shower chair or bench in shower? Yes  Elevated toilet seat or a handicapped toilet? Yes   TIMED UP AND GO:  Was the test performed? Yes .  Length of time to ambulate 10 feet: 4 sec.   Gait steady and fast without use of assistive device  Cognitive Function:     6CIT Screen 02/03/2019 12/16/2015  What Year? 0 points 0 points  What month? 0 points 0 points  What time? 0 points 0 points  Count back from 20 0 points 0 points  Months in reverse 0 points 0 points  Repeat phrase 0 points 0 points  Total Score 0  0    Immunizations Immunization History  Administered Date(s) Administered   Fluad Quad(high Dose 65+) 11/29/2019   Influenza, High Dose Seasonal PF 10/17/2016, 11/10/2017, 10/14/2018, 11/16/2020    Influenza-Unspecified 10/23/2014, 10/14/2018   PFIZER Comirnaty(Gray Top)Covid-19 Tri-Sucrose Vaccine 04/19/2020   PFIZER(Purple Top)SARS-COV-2 Vaccination 01/14/2019, 02/04/2019, 11/14/2019   Pneumococcal Conjugate-13 12/16/2013   Pneumococcal Polysaccharide-23 12/23/2015   Tdap 02/14/2013    TDAP status: Up to date  Flu Vaccine status: Up to date  Pneumococcal vaccine status: Up to date  Covid-19 vaccine status: Completed vaccines  Qualifies for Shingles Vaccine? Yes   Zostavax completed No   Shingrix Completed?: No.    Education has been provided regarding the importance of this vaccine. Patient has been advised to call insurance company to determine out of pocket expense if they have not yet received this vaccine. Advised may also receive vaccine at local pharmacy or Health Dept. Verbalized acceptance and understanding.  Screening Tests Health Maintenance  Topic Date Due   Zoster Vaccines- Shingrix (1 of 2) Never done   URINE MICROALBUMIN  10/17/2017   COVID-19 Vaccine (5 - Booster for Pfizer series) 06/14/2020   FOOT EXAM  09/11/2020   OPHTHALMOLOGY EXAM  11/16/2020   HEMOGLOBIN A1C  03/08/2021   TETANUS/TDAP  02/15/2023   DEXA SCAN  10/14/2023   Pneumonia Vaccine 52+ Years old  Completed   INFLUENZA VACCINE  Completed   HPV VACCINES  Aged Out    Health Maintenance  Health Maintenance Due  Topic Date Due   Zoster Vaccines- Shingrix (1 of 2) Never done   URINE MICROALBUMIN  10/17/2017   COVID-19 Vaccine (5 - Booster for Pfizer series) 06/14/2020   FOOT EXAM  09/11/2020   OPHTHALMOLOGY EXAM  11/16/2020    Colorectal cancer screening: Type of screening: Colonoscopy. Completed 06/24/14. Repeat every aged out years  Mammogram status: Completed 04/19/20. Repeat every year- aged out  Bone Density status: Completed 10/14/18. Results reflect: Bone density results: NORMAL. Repeat every 5 years.  Lung Cancer Screening: (Low Dose CT Chest recommended if Age 53-80 years, 30  pack-year currently smoking OR have quit w/in 15years.) does not qualify.    Additional Screening:  Hepatitis C Screening: does not qualify; Completed no  Vision Screening: Recommended annual ophthalmology exams for early detection of glaucoma and other disorders of the eye. Is the patient up to date with their annual eye exam?  Yes  Who is the provider or what is the name of the office in which the patient attends annual eye exams? Sees Dr.Zhang q59months at [facility] If pt is not established with a provider, would they like to be referred to a provider to establish care? No .   Dental Screening: Recommended annual dental exams for proper oral hygiene  Community Resource Referral / Chronic Care Management: CRR required this visit?  No   CCM required this visit?  No      Plan:     I have personally reviewed and noted the following in the patient's chart:   Medical and social history Use of alcohol, tobacco or illicit drugs  Current medications and supplements including opioid prescriptions.  Functional ability and status Nutritional status Physical activity Advanced directives List of other physicians Hospitalizations, surgeries, and ER visits in previous 12 months Vitals Screenings to include cognitive, depression, and falls Referrals and appointments  In addition, I have reviewed and discussed with patient certain preventive protocols, quality metrics, and best practice recommendations. A written personalized care plan for preventive  services as well as general preventive health recommendations were provided to patient.     Dionisio David, LPN   [id number]   Nurse Notes: none

## 2021-02-22 NOTE — Patient Instructions (Signed)
Nicole Young , Thank you for taking time to come for your Medicare Wellness Visit. I appreciate your ongoing commitment to your health goals. Please review the following plan we discussed and let me know if I can assist you in the future.   Screening recommendations/referrals: Colonoscopy: 06/24/14 Mammogram: 04/19/20 Bone Density: 10/14/18 Recommended yearly ophthalmology/optometry visit for glaucoma screening and checkup Recommended yearly dental visit for hygiene and checkup  Vaccinations: Influenza vaccine: 11/16/20 Pneumococcal vaccine: 12/23/15 Tdap vaccine: 02/14/13 Shingles vaccine: n/d   Covid-19:01/14/19, 02/04/19, 11/14/19, 04/19/20  Advanced directives: no  Conditions/risks identified: none  Next appointment: Follow up in one year for your annual wellness visit 02/23/22 @ 10:40am in person   Preventive Care 82 Years and Older, Female Preventive care refers to lifestyle choices and visits with your health care provider that can promote health and wellness. What does preventive care include? A yearly physical exam. This is also called an annual well check. Dental exams once or twice a year. Routine eye exams. Ask your health care provider how often you should have your eyes checked. Personal lifestyle choices, including: Daily care of your teeth and gums. Regular physical activity. Eating a healthy diet. Avoiding tobacco and drug use. Limiting alcohol use. Practicing safe sex. Taking low-dose aspirin every day. Taking vitamin and mineral supplements as recommended by your health care provider. What happens during an annual well check? The services and screenings done by your health care provider during your annual well check will depend on your age, overall health, lifestyle risk factors, and family history of disease. Counseling  Your health care provider may ask you questions about your: Alcohol use. Tobacco use. Drug use. Emotional well-being. Home and relationship  well-being. Sexual activity. Eating habits. History of falls. Memory and ability to understand (cognition). Work and work Statistician. Reproductive health. Screening  You may have the following tests or measurements: Height, weight, and BMI. Blood pressure. Lipid and cholesterol levels. These may be checked every 5 years, or more frequently if you are over 53 years old. Skin check. Lung cancer screening. You may have this screening every year starting at age 48 if you have a 30-pack-year history of smoking and currently smoke or have quit within the past 15 years. Fecal occult blood test (FOBT) of the stool. You may have this test every year starting at age 53. Flexible sigmoidoscopy or colonoscopy. You may have a sigmoidoscopy every 5 years or a colonoscopy every 10 years starting at age 80. Hepatitis C blood test. Hepatitis B blood test. Sexually transmitted disease (STD) testing. Diabetes screening. This is done by checking your blood sugar (glucose) after you have not eaten for a while (fasting). You may have this done every 1-3 years. Bone density scan. This is done to screen for osteoporosis. You may have this done starting at age 68. Mammogram. This may be done every 1-2 years. Talk to your health care provider about how often you should have regular mammograms. Talk with your health care provider about your test results, treatment options, and if necessary, the need for more tests. Vaccines  Your health care provider may recommend certain vaccines, such as: Influenza vaccine. This is recommended every year. Tetanus, diphtheria, and acellular pertussis (Tdap, Td) vaccine. You may need a Td booster every 10 years. Zoster vaccine. You may need this after age 53. Pneumococcal 13-valent conjugate (PCV13) vaccine. One dose is recommended after age 20. Pneumococcal polysaccharide (PPSV23) vaccine. One dose is recommended after age 40. Talk to your health care  provider about which  screenings and vaccines you need and how often you need them. This information is not intended to replace advice given to you by your health care provider. Make sure you discuss any questions you have with your health care provider. Document Released: 01/22/2015 Document Revised: 09/15/2015 Document Reviewed: 10/27/2014 Elsevier Interactive Patient Education  2017 [place] Prevention in the Home Falls can cause injuries. They can happen to people of all ages. There are many things you can do to make your home safe and to help prevent falls. What can I do on the outside of my home? Regularly fix the edges of walkways and driveways and fix any cracks. Remove anything that might make you trip as you walk through a door, such as a raised step or threshold. Trim any bushes or trees on the path to your home. Use bright outdoor lighting. Clear any walking paths of anything that might make someone trip, such as rocks or tools. Regularly check to see if handrails are loose or broken. Make sure that both sides of any steps have handrails. Any raised decks and porches should have guardrails on the edges. Have any leaves, snow, or ice cleared regularly. Use sand or salt on walking paths during winter. Clean up any spills in your garage right away. This includes oil or grease spills. What can I do in the bathroom? Use night lights. Install grab bars by the toilet and in the tub and shower. Do not use towel bars as grab bars. Use non-skid mats or decals in the tub or shower. If you need to sit down in the shower, use a plastic, non-slip stool. Keep the floor dry. Clean up any water that spills on the floor as soon as it happens. Remove soap buildup in the tub or shower regularly. Attach bath mats securely with double-sided non-slip rug tape. Do not have throw rugs and other things on the floor that can make you trip. What can I do in the bedroom? Use night lights. Make sure that you have a  light by your bed that is easy to reach. Do not use any sheets or blankets that are too big for your bed. They should not hang down onto the floor. Have a firm chair that has side arms. You can use this for support while you get dressed. Do not have throw rugs and other things on the floor that can make you trip. What can I do in the kitchen? Clean up any spills right away. Avoid walking on wet floors. Keep items that you use a lot in easy-to-reach places. If you need to reach something above you, use a strong step stool that has a grab bar. Keep electrical cords out of the way. Do not use floor polish or wax that makes floors slippery. If you must use wax, use non-skid floor wax. Do not have throw rugs and other things on the floor that can make you trip. What can I do with my stairs? Do not leave any items on the stairs. Make sure that there are handrails on both sides of the stairs and use them. Fix handrails that are broken or loose. Make sure that handrails are as long as the stairways. Check any carpeting to make sure that it is firmly attached to the stairs. Fix any carpet that is loose or worn. Avoid having throw rugs at the top or bottom of the stairs. If you do have throw rugs, attach them to the  floor with carpet tape. Make sure that you have a light switch at the top of the stairs and the bottom of the stairs. If you do not have them, ask someone to add them for you. What else can I do to help prevent falls? Wear shoes that: Do not have high heels. Have rubber bottoms. Are comfortable and fit you well. Are closed at the toe. Do not wear sandals. If you use a stepladder: Make sure that it is fully opened. Do not climb a closed stepladder. Make sure that both sides of the stepladder are locked into place. Ask someone to hold it for you, if possible. Clearly mark and make sure that you can see: Any grab bars or handrails. First and last steps. Where the edge of each step  is. Use tools that help you move around (mobility aids) if they are needed. These include: Canes. Walkers. Scooters. Crutches. Turn on the lights when you go into a dark area. Replace any light bulbs as soon as they burn out. Set up your furniture so you have a clear path. Avoid moving your furniture around. If any of your floors are uneven, fix them. If there are any pets around you, be aware of where they are. Review your medicines with your doctor. Some medicines can make you feel dizzy. This can increase your chance of falling. Ask your doctor what other things that you can do to help prevent falls. This information is not intended to replace advice given to you by your health care provider. Make sure you discuss any questions you have with your health care provider. Document Released: 10/22/2008 Document Revised: 06/03/2015 Document Reviewed: 01/30/2014 Elsevier Interactive Patient Education  2017 [employer].

## 2021-02-22 NOTE — Progress Notes (Signed)
Established patient visit   Patient: Nicole Young   DOB: 11/01/1940   81 y.o. Female  MRN: [id number] Visit Date: 02/22/2021  Today's healthcare provider: Mikey Kirschner, PA-C   Chief Complaint  Patient presents with   Cough   Subjective    HPI  Nicole Young is an 81 y/o female who reports a cough/cold since last week. Reports negative home covid test. She primarily has issue with a productive frequent cough, produces white mucous. Feels her breathing is noisy at rest. Denies fevers, chills, nasal congestion, SOB, wheezing.  She has been taking left over cough syrup at night to sleep. Not using anything during the day.   Medications: Outpatient Medications Prior to Visit  Medication Sig   Accu-Chek FastClix Lancets MISC CHECK SUGAR ONCE DAILY. DX: E11.9   ACCU-CHEK SMARTVIEW test strip USE TO CHECK BLOOD SUGAR ONCE A DAY   Calcium-Vitamin D-Vitamin K (CALCIUM SOFT CHEWS PO) Take by mouth daily.    cholecalciferol (VITAMIN D3) 25 MCG (1000 UT) tablet Take 600 Units by mouth daily.   hydroquinone 4 % cream Apply topically 2 (two) times daily.   levothyroxine (SYNTHROID) 50 MCG tablet TAKE ONE AND ONE-HALF TABLETS DAILY   Magnesium 250 MG TABS Take 1 tablet by mouth daily.   metFORMIN (GLUCOPHAGE-XR) 500 MG 24 hr tablet TAKE 1 TABLET TWICE A DAY   methocarbamol (ROBAXIN) 500 MG tablet Robaxin 500 mg tablet  Take 1 tablet twice a day by oral route.   mometasone (ELOCON) 0.1 % cream Apply 1 application topically See admin instructions. Use 1 - 2 times daily until clear, then use as a spot treatment when needed   Multiple Vitamin (MULTIVITAMIN) capsule Take 1 capsule by mouth daily.    Multiple Vitamins-Minerals (PRESERVISION AREDS PO) Take by mouth 2 (two) times daily.   pantoprazole (PROTONIX) 20 MG tablet TAKE 1 TABLET DAILY   rosuvastatin (CRESTOR) 20 MG tablet Take 1 tablet (20 mg total) by mouth daily.   sertraline (ZOLOFT) 100 MG tablet TAKE 1 TABLET DAILY   [DISCONTINUED]  benzonatate (TESSALON) 200 MG capsule Take 1 capsule (200 mg total) by mouth 3 (three) times daily as needed. (Patient not taking: Reported on 02/15/2021)   [DISCONTINUED] celecoxib (CELEBREX) 200 MG capsule Take 200 mg by mouth daily. (Patient not taking: Reported on 02/22/2021)   [DISCONTINUED] diazepam (VALIUM) 2 MG tablet Take 1 tablet (2 mg total) by mouth every 12 (twelve) hours as needed for anxiety. (Patient not taking: Reported on 02/22/2021)   [DISCONTINUED] meloxicam (MOBIC) 15 MG tablet Take 15 mg by mouth daily. (Patient not taking: Reported on 02/22/2021)   [DISCONTINUED] oxybutynin (DITROPAN-XL) 5 MG 24 hr tablet Take 1 tablet (5 mg total) by mouth at bedtime. (Patient not taking: Reported on 02/22/2021)   No facility-administered medications prior to visit.    Review of Systems  Constitutional:  Negative for fatigue and fever.  Respiratory:  Positive for cough. Negative for shortness of breath.   Cardiovascular:  Negative for chest pain and leg swelling.  Gastrointestinal:  Negative for abdominal pain.  Neurological:  Negative for dizziness and headaches.      Objective    Blood pressure (!) 116/57, pulse 81, temperature 98.1 F (36.7 C), temperature source Oral, height 5' (1.524 m), weight 139 lb 3.2 oz (63.1 kg), SpO2 99 %.   Physical Exam Constitutional:      General: She is awake.     Appearance: She is well-developed.  HENT:  Head: Normocephalic.  Eyes:     Conjunctiva/sclera: Conjunctivae normal.  Cardiovascular:     Rate and Rhythm: Normal rate and regular rhythm.     Heart sounds: Normal heart sounds.  Pulmonary:     Effort: Pulmonary effort is normal.     Breath sounds: No stridor. No wheezing, rhonchi or rales.  Skin:    General: Skin is warm.  Neurological:     Mental Status: She is alert and oriented to person, place, and time.  Psychiatric:        Attention and Perception: Attention normal.        Mood and Affect: Mood normal.        Speech: Speech  normal.        Behavior: Behavior is cooperative.     No results found for any visits on 02/22/21.  Assessment & Plan     Acute bronchitis Advised increase fluids, take mucinex or robitussin during the day for cough Her rx cough syrup is from 2016, rx a new bottle.  Advised to return if fevers, chills, SOB.  Return if symptoms worsen or fail to improve.      I, Mikey Kirschner, PA-C have reviewed all documentation for this visit. The documentation on  02/22/2021  for the exam, diagnosis, procedures, and orders are all accurate and complete.    Mikey Kirschner, PA-C  [facility] [phone] (phone) [phone] (fax)  [place]

## 2021-02-25 ENCOUNTER — Other Ambulatory Visit: Payer: Self-pay | Admitting: Family Medicine

## 2021-02-25 DIAGNOSIS — M7542 Impingement syndrome of left shoulder: Secondary | ICD-10-CM

## 2021-02-25 DIAGNOSIS — E119 Type 2 diabetes mellitus without complications: Secondary | ICD-10-CM

## 2021-02-25 HISTORY — DX: Impingement syndrome of left shoulder: M75.42

## 2021-02-25 NOTE — Telephone Encounter (Signed)
Requested Prescriptions  Pending Prescriptions Disp Refills   metFORMIN (GLUCOPHAGE-XR) 500 MG 24 hr tablet [Pharmacy Med Name: METFORMIN HCL ER TABS 500MG] 180 tablet 0    Sig: TAKE 1 TABLET TWICE A DAY     Endocrinology:  Diabetes - Biguanides Passed - 02/25/2021 12:22 AM      Passed - Cr in normal range and within 360 days    Creatinine, Ser  Date Value Ref Range Status  03/31/2020 0.79 0.57 - 1.00 mg/dL Final         Passed - HBA1C is between 0 and 7.9 and within 180 days    Hemoglobin A1C  Date Value Ref Range Status  09/06/2020 6.1 (A) 4.0 - 5.6 % Final   Hgb A1c MFr Bld  Date Value Ref Range Status  03/31/2020 6.2 (H) 4.8 - 5.6 % Final    Comment:             Prediabetes: 5.7 - 6.4          Diabetes: >6.4          Glycemic control for adults with diabetes: <7.0          Passed - eGFR in normal range and within 360 days    GFR calc Af Amer  Date Value Ref Range Status  06/02/2019 96 >59 mL/min/1.73 Final    Comment:    **[facility] currently reports eGFR in compliance with the current**   recommendations of the [employer]. [facility] will   update reporting as new guidelines are published from the NKF-ASN   Task force.    GFR calc non Af Amer  Date Value Ref Range Status  06/02/2019 83 >59 mL/min/1.73 Final   eGFR  Date Value Ref Range Status  03/31/2020 75 >59 mL/min/1.73 Final         Passed - B12 Level in normal range and within 720 days    Vitamin B-12  Date Value Ref Range Status  09/10/2020 624 232 - 1,245 pg/mL Final         Passed - Valid encounter within last 6 months    Recent Outpatient Visits          3 days ago Acute bronchitis, unspecified organism   [facility] Thedore Mins, [place], PA-C   5 months ago Type 2 diabetes mellitus without complication, without long-term current use of insulin ([place])   [facility] Jerrol Banana., MD   10 months ago Type 2 diabetes mellitus without complication,  without long-term current use of insulin [facility])   [facility] Jerrol Banana., MD   11 months ago Type 2 diabetes mellitus without complication, without long-term current use of insulin [facility])   [facility] Jerrol Banana., MD   1 year ago Type 2 diabetes mellitus without complication, without long-term current use of insulin [facility])   [facility] Jerrol Banana., MD      Future Appointments            In 1 week Jerrol Banana., MD [facility] [place]   In 3 weeks Brendolyn Patty, MD [place] within normal limits and completed in the last 12 months    WBC  Date Value Ref Range Status  09/10/2020 8.9 3.4 - 10.8 x10E3/uL Final   RBC  Date Value Ref Range Status  09/10/2020 4.53 3.77 -  5.28 x10E6/uL Final   Hemoglobin  Date Value Ref Range Status  09/10/2020 13.4 11.1 - 15.9 g/dL Final   Hematocrit  Date Value Ref Range Status  09/10/2020 41.3 34.0 - 46.6 % Final   MCHC  Date Value Ref Range Status  09/10/2020 32.4 31.5 - 35.7 g/dL Final   [facility]  Date Value Ref Range Status  09/10/2020 29.6 26.6 - 33.0 pg Final   MCV  Date Value Ref Range Status  09/10/2020 91 79 - 97 fL Final   No results found for: PLTCOUNTKUC, LABPLAT, POCPLA RDW  Date Value Ref Range Status  09/10/2020 13.4 11.7 - 15.4 % Final

## 2021-03-07 NOTE — Progress Notes (Signed)
[place] P[place],acting as a scribe for [name], MD.,have documented all relevant documentation on the behalf of [name], MD,as directed by  [name], MD while in the presence of [name], MD.   Established patient visit   Patient: Nicole Young   DOB: 1940/05/09   81 y.o. Female  MRN: [id number] Visit Date: 03/08/2021  Today's healthcare provider: [name], MD   No chief complaint on file.  Subjective    HPI  Patient is followed by endocrinology for mild hyperglycemia/diabetes and by orthopedics for shoulder pain.  She recently had a cortisone injection in her right shoulder. She is feeling well and is getting ready to go to [place] the spring with her granddaughter. Diabetes Mellitus Type II, Follow-up  Followed by endocrinology.  Last A1C was done on 01/17/21 and was 6.6.  Hypertension, follow-up  BP Readings from Last 3 Encounters:  03/08/21 136/65  02/22/21 (!) 116/57  09/06/20 123/76   Wt Readings from Last 3 Encounters:  03/08/21 137 lb (62.1 kg)  02/22/21 139 lb 3.2 oz (63.1 kg)  02/22/21 139 lb 3.2 oz (63.1 kg)     She was last seen for hypertension 6 months ago.  BP at that visit was as above. Management since that visit includes none.  She reports good compliance with treatment. She is not having side effects.  She is following a Regular diet. She is exercising. She does not smoke.  Use of agents associated with hypertension: none.   Outside blood pressures are not being checked regularly. Symptoms: No chest pain No chest pressure  No palpitations No syncope  No dyspnea No orthopnea  No paroxysmal nocturnal dyspnea No lower extremity edema   Pertinent labs: Lab Results  Component Value Date   CHOL 131 09/10/2020   HDL 59 09/10/2020   LDLCALC 55 09/10/2020   TRIG 90 09/10/2020   CHOLHDL 2.2 09/10/2020   Lab Results  Component Value Date   NA 137 03/31/2020   K 4.4 03/31/2020   CREATININE  0.79 03/31/2020   EGFR 75 03/31/2020   GLUCOSE 114 (H) 03/31/2020   TSH 2.580 09/10/2020     The ASCVD Risk score (Arnett DK, et al., 2019) failed to calculate for the following reasons:   The 2019 ASCVD risk score is only valid for ages 53 to 62   --------------------------------------------------------------------------------------------------- Depression, Follow-up  She  was last seen for this 6 months ago. Changes made at last visit include none.   She reports good compliance with treatment. She is not having side effects.   She reports good tolerance of treatment. Current symptoms include:  none She feels she is Improved since last visit.  Depression screen [facility] 2/9 03/08/2021 02/22/2021 09/06/2020  Decreased Interest 0 0 0  Down, Depressed, Hopeless 0 1 0  PHQ - 2 Score 0 1 0  Altered sleeping 0 - 0  Tired, decreased energy 1 - 0  Change in appetite 0 - 0  Feeling bad or failure about yourself  0 - 0  Trouble concentrating 0 - 0  Moving slowly or fidgety/restless 0 - 0  Suicidal thoughts 0 - 0  PHQ-9 Score 1 - 0  Difficult doing work/chores Not difficult at all - Not difficult at all  Some recent data might be hidden    -----------------------------------------------------------------------------------------   Medications: Outpatient Medications Prior to Visit  Medication Sig   Accu-Chek FastClix Lancets MISC CHECK SUGAR ONCE DAILY. DX: E11.9  ACCU-CHEK SMARTVIEW test strip USE TO CHECK BLOOD SUGAR ONCE A DAY   Calcium-Vitamin D-Vitamin K (CALCIUM SOFT CHEWS PO) Take by mouth daily.    cholecalciferol (VITAMIN D3) 25 MCG (1000 UT) tablet Take 600 Units by mouth daily.   guaiFENesin-codeine (CHERATUSSIN AC) 100-10 MG/5ML syrup Take 5 mLs by mouth at bedtime as needed for cough (up to two doses daily). (Patient not taking: Reported on 03/08/2021)   hydroquinone 4 % cream Apply topically 2 (two) times daily.   levothyroxine (SYNTHROID) 50 MCG tablet TAKE ONE AND  ONE-HALF TABLETS DAILY   Magnesium 250 MG TABS Take 1 tablet by mouth daily.   metFORMIN (GLUCOPHAGE-XR) 500 MG 24 hr tablet TAKE 1 TABLET TWICE A DAY   methocarbamol (ROBAXIN) 500 MG tablet Robaxin 500 mg tablet  Take 1 tablet twice a day by oral route.   mometasone (ELOCON) 0.1 % cream Apply 1 application topically See admin instructions. Use 1 - 2 times daily until clear, then use as a spot treatment when needed   Multiple Vitamin (MULTIVITAMIN) capsule Take 1 capsule by mouth daily.    Multiple Vitamins-Minerals (PRESERVISION AREDS PO) Take by mouth 2 (two) times daily.   pantoprazole (PROTONIX) 20 MG tablet TAKE 1 TABLET DAILY   rosuvastatin (CRESTOR) 20 MG tablet Take 1 tablet (20 mg total) by mouth daily.   sertraline (ZOLOFT) 100 MG tablet TAKE 1 TABLET DAILY   No facility-administered medications prior to visit.    Review of Systems  Psychiatric/Behavioral:  Negative for agitation, confusion, decreased concentration, dysphoric mood and sleep disturbance. The patient is not nervous/anxious and is not hyperactive.        Objective    BP 136/65 (BP Location: Left Arm, Patient Position: Sitting, Cuff Size: Normal)    Pulse 78    Temp (!) 97.5 F (36.4 C) (Oral)    Wt 137 lb (62.1 kg)    SpO2 98%    BMI 26.76 kg/m  BP Readings from Last 3 Encounters:  03/08/21 136/65  02/22/21 (!) 116/57  09/06/20 123/76   Wt Readings from Last 3 Encounters:  03/08/21 137 lb (62.1 kg)  02/22/21 139 lb 3.2 oz (63.1 kg)  02/22/21 139 lb 3.2 oz (63.1 kg)      Physical Exam Vitals reviewed.  Constitutional:      Appearance: She is well-developed.  HENT:     Head: Normocephalic and atraumatic.     Right Ear: External ear normal.     Left Ear: External ear normal.     Nose: Nose normal.  Eyes:     General: No scleral icterus.    Conjunctiva/sclera: Conjunctivae normal.  Neck:     Thyroid: No thyromegaly.  Cardiovascular:     Rate and Rhythm: Normal rate and regular rhythm.     Heart  sounds: Normal heart sounds.  Pulmonary:     Effort: Pulmonary effort is normal.     Breath sounds: Normal breath sounds.  Abdominal:     Palpations: Abdomen is soft.     Tenderness: There is no guarding or rebound. Negative signs include Murphy's sign and McBurney's sign.  Musculoskeletal:     Right lower leg: No edema.     Left lower leg: No edema.  Skin:    General: Skin is warm and dry.  Neurological:     General: No focal deficit present.     Mental Status: She is alert and oriented to person, place, and time.  Psychiatric:  Mood and Affect: Mood normal.        Behavior: Behavior normal.        Thought Content: Thought content normal.        Judgment: Judgment normal.      No results found for any visits on 03/08/21.  Assessment & Plan     1. Essential (primary) hypertension Excellent control, urine microalbumin on next visit  2. Allergic rhinitis due to pollen, unspecified seasonality   3. Adult hypothyroidism SH later this year  4. Type 2 diabetes mellitus without complication, without long-term current use of insulin ([place]) Followed by endocrinology.  5. Other hyperlipidemia On rosuvastatin 20  6. Arthralgia of multiple joints Well followed by orthopedics.   No follow-ups on file.      I, [name], MD, have reviewed all documentation for this visit. The documentation on 03/09/21 for the exam, diagnosis, procedures, and orders are all accurate and complete.    Larita Deremer Cranford Mon, MD  [facility] [phone] (phone) [phone] (fax)  [place]

## 2021-03-08 ENCOUNTER — Other Ambulatory Visit: Payer: Self-pay

## 2021-03-08 ENCOUNTER — Ambulatory Visit (INDEPENDENT_AMBULATORY_CARE_PROVIDER_SITE_OTHER): Payer: Medicare Other | Admitting: Family Medicine

## 2021-03-08 VITALS — BP 136/65 | HR 78 | Temp 97.5°F | Wt 137.0 lb

## 2021-03-08 DIAGNOSIS — E119 Type 2 diabetes mellitus without complications: Secondary | ICD-10-CM

## 2021-03-08 DIAGNOSIS — E7849 Other hyperlipidemia: Secondary | ICD-10-CM | POA: Diagnosis not present

## 2021-03-08 DIAGNOSIS — I10 Essential (primary) hypertension: Secondary | ICD-10-CM | POA: Diagnosis not present

## 2021-03-08 DIAGNOSIS — M2550 Pain in unspecified joint: Secondary | ICD-10-CM

## 2021-03-08 DIAGNOSIS — J301 Allergic rhinitis due to pollen: Secondary | ICD-10-CM | POA: Diagnosis not present

## 2021-03-08 DIAGNOSIS — E039 Hypothyroidism, unspecified: Secondary | ICD-10-CM | POA: Diagnosis not present

## 2021-03-22 ENCOUNTER — Ambulatory Visit (INDEPENDENT_AMBULATORY_CARE_PROVIDER_SITE_OTHER): Payer: Medicare Other | Admitting: Dermatology

## 2021-03-22 ENCOUNTER — Other Ambulatory Visit: Payer: Self-pay

## 2021-03-22 DIAGNOSIS — B079 Viral wart, unspecified: Secondary | ICD-10-CM

## 2021-03-22 NOTE — Patient Instructions (Addendum)
Cryotherapy Aftercare ? ?Wash gently with soap and water everyday.   ?Apply Vaseline and Band-Aid daily until healed.  ? ?Instructions for After In-Office Application of Cantharidin ? ?1. This is a strong medicine; please follow ALL instructions. ? ?2. Gently wash off with soap and water in four hours or sooner as directed by your physician. ? ?3. **WARNING** this medicine can cause severe blistering, blood blisters, infection, and/or scarring if it is not washed off as directed. ? ?4. Your progress will be rechecked in 1-2 months; call sooner if there are any questions or problems. ? ?If You Need Anything After Your Visit ? ?If you have any questions or concerns for your doctor, please call our main line at [phone] and press option 4 to reach your doctor's medical assistant. If no one answers, please leave a voicemail as directed and we will return your call as soon as possible. Messages left after 4 pm will be answered the following business day.  ? ?You may also send [place] a message via MyChart. We typically respond to MyChart messages within 1-2 business days. ? ?For prescription refills, please ask your pharmacy to contact our office. Our fax number is [phone]. ? ?If you have an urgent issue when the clinic is closed that cannot wait until the next business day, you can page your doctor at the number below.   ? ?Please note that while we do our best to be available for urgent issues outside of office hours, we are not available 24/7.  ? ?If you have an urgent issue and are unable to reach [place], you may choose to seek medical care at your doctor's office, retail clinic, urgent care center, or emergency room. ? ?If you have a medical emergency, please immediately call 911 or go to the emergency department. ? ?Pager Numbers ? ?- Dr. Nehemiah Massed: [phone] ? ?- Dr. Laurence Ferrari: [phone] ? ?- Dr. Nicole Kindred: [phone] ? ?In the event of inclement weather, please call our main line at [phone] for an update on  the status of any delays or closures. ? ?Dermatology Medication Tips: ?Please keep the boxes that topical medications come in in order to help keep track of the instructions about where and how to use these. Pharmacies typically print the medication instructions only on the boxes and not directly on the medication tubes.  ? ?If your medication is too expensive, please contact our office at [phone] option 4 or send [place] a message through [place].  ? ?We are unable to tell what your co-pay for medications will be in advance as this is different depending on your insurance coverage. However, we may be able to find a substitute medication at lower cost or fill out paperwork to get insurance to cover a needed medication.  ? ?If a prior authorization is required to get your medication covered by your insurance company, please allow [place] 1-2 business days to complete this process. ? ?Drug prices often vary depending on where the prescription is filled and some pharmacies may offer cheaper prices. ? ?The website www.goodrx.com contains coupons for medications through different pharmacies. The prices here do not account for what the cost may be with help from insurance (it may be cheaper with your insurance), but the website can give you the price if you did not use any insurance.  ?- You can print the associated coupon and take it with your prescription to the pharmacy.  ?- You may also stop by our office during regular business hours and pick  up a GoodRx coupon card.  ?- If you need your prescription sent electronically to a different pharmacy, notify our office through [facility] or by phone at [phone] option 4. ? ? ? ? ?Si Usted Necesita Algo Despu?s de Su Visita ? ?Tambi?n puede enviarnos un mensaje a trav?s de MyChart. Por lo general respondemos a los mensajes de MyChart en el transcurso de 1 a 2 d?as h?biles. ? ?Para renovar recetas, por favor pida a su farmacia que se ponga en contacto con nuestra  oficina. Nuestro n?mero de fax es el [phone]. ? ?Si tiene un asunto urgente cuando la cl?nica est? cerrada y que no puede esperar hasta el siguiente d?a h?bil, puede llamar/localizar a su doctor(a) al n?mero que aparece a continuaci?n.  ? ?Por favor, tenga en cuenta que aunque hacemos todo lo posible para estar disponibles para asuntos urgentes fuera del horario de oficina, no estamos disponibles las 24 horas del d?a, los 7 d?as de la semana.  ? ?Si tiene un problema urgente y no puede comunicarse con nosotros, puede optar por buscar atenci?n m?dica  en el consultorio de su doctor(a), en una cl?nica privada, en un centro de atenci?n urgente o en una sala de emergencias. ? ?Si tiene Engineer, maintenance (IT) m?dica, por favor llame inmediatamente al 911 o vaya a la sala de emergencias. ? ?N?meros de b?per ? ?- Dr. Nehemiah Massed: [phone] ? ?- Dra. Moye: [phone] ? ?- Dra. Nicole Kindred: [phone] ? ?En caso de inclemencias del tiempo, por favor llame a nuestra l?nea principal al [phone] para una actualizaci?n sobre el estado de cualquier retraso o cierre. ? ?Consejos para la medicaci?n en dermatolog?a: ?Por favor, guarde las cajas en las que vienen los medicamentos de uso t?pico para ayudarle a seguir las instrucciones sobre d?nde y c?mo usarlos. Las farmacias generalmente imprimen las instrucciones del medicamento s?lo en las cajas y no directamente en los tubos del [place].  ? ?Si su medicamento es muy caro, por favor, p?ngase en contacto con Zigmund Daniel llamando al [phone] y presione la opci?n 4 o env?enos un mensaje a trav?s de MyChart.  ? ?No podemos decirle cu?l ser? su copago por los medicamentos por adelantado ya que esto es diferente dependiendo de la cobertura de su seguro. Sin embargo, es posible que podamos encontrar un medicamento sustituto a Electrical engineer un formulario para que el seguro cubra el medicamento que se considera necesario.  ? ?Si se requiere Ardelia Mems autorizaci?n previa para  que su compa??a de seguros [place] su medicamento, por favor perm?tanos de 1 a 2 d?as h?biles para completar este proceso. ? ?Los precios de los medicamentos var?an con frecuencia dependiendo del Environmental consultant de d?nde se surte la receta y alguna farmacias pueden ofrecer precios m?s baratos. ? ?El sitio web www.goodrx.com tiene cupones para medicamentos de Airline pilot. Los precios aqu? no tienen en cuenta lo que podr?a costar con la ayuda del seguro (puede ser m?s barato con su seguro), pero el sitio web puede darle el precio si no utiliz? ning?n seguro.  ?- Puede imprimir el cup?n correspondiente y llevarlo con su receta a la farmacia.  ?- Tambi?n puede pasar por nuestra oficina durante el horario de atenci?n regular y recoger una tarjeta de cupones de GoodRx.  ?- Si necesita que su receta se env?e electr?nicamente a Chiropodist, informe a nuestra oficina a trav?s de MyChart de [place] o por tel?fono llamando al [phone] y presione la opci?n 4. ? ?

## 2021-03-22 NOTE — Progress Notes (Signed)
? ?  Follow-Up Visit ?  ?Subjective  ?Nicole Young is a 81 y.o. female who presents for the following: Warts (Patient here today for wart follow up. Wart at right thumb tip treated previously with LN2 and cantharidin plus. Patient advises wart is improved. ). ? ? ?The following portions of the chart were reviewed this encounter and updated as appropriate:  ?  ?  ? ?Review of Systems:  No other skin or systemic complaints except as noted in HPI or Assessment and Plan. ? ?Objective  ?Well appearing patient in no apparent distress; mood and affect are within normal limits. ? ?A focused examination was performed including right hand and fingers. Relevant physical exam findings are noted in the Assessment and Plan. ? ?Right Thumb Tip ?2 mm verrucous papule -- Discussed viral etiology and contagion.  ? ? ? ?Assessment & Plan  ?Viral warts, unspecified type ?Right Thumb Tip ? ?Discussed viral etiology and risk of spread.  Discussed multiple treatments may be required to clear warts.  Discussed possible post-treatment dyspigmentation and risk of recurrence. ? ? ?Destruction of lesion - Right Thumb Tip ? ?Destruction method: cryotherapy   ?Informed consent: discussed and consent obtained   ?Lesion destroyed using liquid nitrogen: Yes   ?Region frozen until ice ball extended beyond lesion: Yes   ?Outcome: patient tolerated procedure well with no complications   ?Post-procedure details: wound care instructions given   ?Additional details:  Prior to procedure, discussed risks of blister formation, small wound, skin dyspigmentation, or rare scar following cryotherapy. Recommend Vaseline ointment to treated areas while healing.  ? ?Destruction of lesion - Right Thumb Tip ? ?Destruction method: chemical removal   ?Informed consent: discussed and consent obtained   ?Timeout:  patient name, date of birth, surgical site, and procedure verified ?Chemical destruction method: cantharidin   ?Chemical destruction method comment:   Plus ?Application time:  4 hours ?Procedure instructions: patient instructed to wash and dry area   ?Outcome: patient tolerated procedure well with no complications   ?Post-procedure details: wound care instructions given   ?Additional details:  Cantharidin Plus is a blistering agent that comes from a beetle.  It needs to be washed off in about 4 hours after application.  Although it is painless when applied in office, it may cause symptoms of mild pain and burning several hours later.  Treated areas will swell and turn red, and blisters may form.  Vaseline and a bandaid may be applied until wound has healed.  Once healed, the skin may remain temporarily discolored.  It can take weeks to months for pigmentation to return to normal.  Advised to wash off with soap and water in 4 hours or sooner if it becomes tender before then. ? ? ? ? ?Return in about 1 month (around 04/22/2021) for Warts. ? ?Graciella Belton, RMA, am acting as scribe for Brendolyn Patty, MD . ? ?Documentation: I have reviewed the above documentation for accuracy and completeness, and I agree with the above. ? ?Brendolyn Patty MD  ? ?

## 2021-03-30 ENCOUNTER — Other Ambulatory Visit: Payer: Self-pay | Admitting: Family Medicine

## 2021-03-31 ENCOUNTER — Ambulatory Visit
Admission: EM | Admit: 2021-03-31 | Discharge: 2021-03-31 | Disposition: A | Discharge status: Home / Self Care | Payer: Medicare Other | Attending: Emergency Medicine | Admitting: Emergency Medicine

## 2021-03-31 ENCOUNTER — Other Ambulatory Visit: Payer: Self-pay

## 2021-03-31 ENCOUNTER — Encounter: Payer: Self-pay | Admitting: Emergency Medicine

## 2021-03-31 DIAGNOSIS — R350 Frequency of micturition: Secondary | ICD-10-CM | POA: Diagnosis not present

## 2021-03-31 DIAGNOSIS — N9089 Other specified noninflammatory disorders of vulva and perineum: Secondary | ICD-10-CM | POA: Insufficient documentation

## 2021-03-31 LAB — POCT URINALYSIS DIP (MANUAL ENTRY)
Bilirubin, UA: NEGATIVE
Blood, UA: NEGATIVE
Glucose, UA: NEGATIVE mg/dL
Ketones, POC UA: NEGATIVE mg/dL
Nitrite, UA: NEGATIVE
Protein Ur, POC: NEGATIVE mg/dL
Spec Grav, UA: 1.01 (ref 1.010–1.025)
Urobilinogen, UA: 0.2 E.U./dL
pH, UA: 5.5 (ref 5.0–8.0)

## 2021-03-31 MED ORDER — ESTRADIOL 0.1 MG/GM VA CREA: TOPICAL_CREAM | VAGINAL | 12 refills | Status: DC

## 2021-03-31 NOTE — ED Provider Notes (Signed)
?[facility] ? ? ? ?CSN: [id number] ?Arrival date & time: 03/31/21  1151 ? ? ?  ? ?History   ?Chief Complaint ?Chief Complaint  ?Patient presents with  ? Dysuria  ? Urinary Frequency  ? ? ?HPI ?Nicole Young is a 81 y.o. female. Pt presents with dysuria and urinary frequency since yesterday.  Patient reports waking up every hour last night with the urge to urinate for small amounts of urine.  Reports burning with urination.  Reports sensation that she needs to urinate frequently even when her bladder is empty.  Does report several months of vulvar irritation and burning.  Review of medical records shows history of vaginal atrophy.  Has not used vaginal estrogen in the past.  Patient reports she used to get UTIs frequently. ? ? ?Dysuria ?Associated symptoms: no abdominal pain, no fever, no flank pain, no nausea, no vaginal discharge and no vomiting   ?Urinary Frequency ?Pertinent negatives include no abdominal pain.  ? ?Past Medical History:  ?Diagnosis Date  ? 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 Complication of anesthesia   ? slow to wake up in past  ? Depression   ? controlled  ? Diabetes mellitus without complication ([place])   ? controlled  ? Hyperlipidemia   ? Hypothyroidism   ? Neuropathy   ? Osteoarthritis   ? knee, ankles, shoulders, back, neck, hands/fingers, wrists;   ? Rectocele   ? Thyroid disease   ? 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 ? ?Patient Active Problem List  ? Diagnosis Date Noted  ? Premature ventricular contractions 08/09/2020  ? Heart palpitations 07/06/2020  ? Pure hypercholesterolemia 07/06/2020  ? COVID-19 virus infection 08/08/2019  ? Vaginal atrophy 08/16/2018  ? Rectocele 08/16/2018  ? Osteoarthritis of knee 10/10/2017  ? Scoliosis of lumbar spine 07/06/2016  ? Degeneration of lumbar intervertebral disc 07/06/2016  ? Arthralgia of multiple joints 11/12/2014  ? Allergic rhinitis 11/12/2014  ? Diabetes mellitus ([place]) 11/12/2014  ? Essential (primary) hypertension  11/12/2014  ? Personal history of other drug therapy 11/12/2014  ? HLD (hyperlipidemia) 11/12/2014  ? Adult hypothyroidism 11/12/2014  ? Mild major depression ([place]) 11/12/2014  ? Overweight 11/12/2014  ? Restless leg 11/12/2014  ? Encounter for screening colonoscopy 04/17/2014  ? Type 2 diabetes mellitus ([place]) 12/23/2013  ? ? ?Past Surgical History:  ?Procedure Laterality Date  ? [place]  ? AUGMENTATION MAMMAPLASTY  2017  ? 2nd augmentation and lift  ? BREAST SURGERY    ? impants replaced with lift  ? CARPAL TUNNEL RELEASE Right 08/26/2014  ? Procedure: CARPAL TUNNEL RELEASE;  Surgeon: Earnestine Leys, MD;  Location: [facility] ORS;  Service: Orthopedics;  Laterality: Right;  ? COLONOSCOPY  2005  ? Dr Bary Castilla  ? COLONOSCOPY N/A 06/24/2014  ? Procedure: COLONOSCOPY;  Surgeon: Robert Bellow, MD;  Location: [facility] ENDOSCOPY;  Service: Endoscopy;  Laterality: N/A;  ? PARATHYROIDECTOMY  1992  ? PLACEMENT OF BREAST IMPLANTS  1975  ? TONSILECTOMY, ADENOIDECTOMY, BILATERAL MYRINGOTOMY AND TUBES    ? ? ?OB History   ? ? Gravida  ?4  ? Para  ?3  ? Term  ?   ? Preterm  ?   ? AB  ?1  ? Living  ?   ?  ? ? SAB  ?1  ? IAB  ?   ? Ectopic  ?   ? Multiple  ?   ? Live Births  ?   ?   ?  ?  Obstetric Comments  ?1st Menstrual Cycle:  12  ?1st Pregnancy: 22  ?  ? ?  ? ? ? ?Home Medications   ? ?Prior to Admission medications   ?Medication Sig Start Date End Date Taking? Authorizing Provider  ?estradiol (ESTRACE) 0.1 MG/GM vaginal cream 1 gram intravaginally at bedtime daily for 2-3 weeks, then decrease to 3 times per week 03/31/21  Yes Haedyn Ancrum, Dionne Bucy, NP  ?Accu-Chek FastClix Lancets MISC CHECK SUGAR ONCE DAILY. DX: E11.9 09/20/18   Jerrol Banana., MD  ?Jerold Coombe test strip USE TO CHECK BLOOD SUGAR ONCE A DAY 11/26/20   Jerrol Banana., MD  ?Calcium-Vitamin D-Vitamin K (CALCIUM SOFT CHEWS PO) Take by mouth daily.     [provider]  ?cholecalciferol (VITAMIN D3) 25 MCG (1000 UT) tablet Take  600 Units by mouth daily.    [provider]  ?guaiFENesin-codeine (CHERATUSSIN AC) 100-10 MG/5ML syrup Take 5 mLs by mouth at bedtime as needed for cough (up to two doses daily). ?Patient not taking: Reported on 03/08/2021 02/22/21   Mikey Kirschner, PA-C  ?hydroquinone 4 % cream Apply topically 2 (two) times daily. 12/08/20   Brendolyn Patty, MD  ?levothyroxine (SYNTHROID) 50 MCG tablet TAKE ONE AND ONE-HALF TABLETS DAILY 11/23/20   Jerrol Banana., MD  ?Magnesium 250 MG TABS Take 1 tablet by mouth daily.    [provider]  ?metFORMIN (GLUCOPHAGE-XR) 500 MG 24 hr tablet TAKE 1 TABLET TWICE A DAY 02/25/21   Jerrol Banana., MD  ?methocarbamol (ROBAXIN) 500 MG tablet Robaxin 500 mg tablet ? Take 1 tablet twice a day by oral route.    [provider]  ?mometasone (ELOCON) 0.1 % cream Apply 1 application topically See admin instructions. Use 1 - 2 times daily until clear, then use as a spot treatment when needed 02/15/21   Brendolyn Patty, MD  ?Multiple Vitamin (MULTIVITAMIN) capsule Take 1 capsule by mouth daily.     [provider]  ?Multiple Vitamins-Minerals (PRESERVISION AREDS PO) Take by mouth 2 (two) times daily.    [provider]  ?pantoprazole (PROTONIX) 20 MG tablet TAKE 1 TABLET DAILY 04/23/20   Jerrol Banana., MD  ?rosuvastatin (CRESTOR) 20 MG tablet TAKE 1 TABLET DAILY 03/30/21   Jerrol Banana., MD  ?sertraline (ZOLOFT) 100 MG tablet TAKE 1 TABLET DAILY 01/04/21   Jerrol Banana., MD  ? ? ?Family History ?Family History  ?Problem Relation Age of Onset  ? Cancer Father   ?     prostate  ? Prostate cancer Father   ? Hypertension Mother   ? Heart attack Mother   ? Heart attack Maternal Grandfather   ? Diabetes Paternal Uncle   ? Congestive Heart Failure Maternal Grandmother   ? CVA Paternal Grandmother   ? Prostate cancer Paternal Grandfather   ? Breast cancer Maternal Aunt   ? ? ?Social History ?Social History  ? ?Tobacco Use  ?  Smoking status: Never  ? Smokeless tobacco: Never  ?Vaping Use  ? Vaping Use: Never used  ?Substance Use Topics  ? Alcohol use: Yes  ?  Alcohol/week: 1.0 standard drink  ?  Types: 1 Glasses of wine per week  ? Drug use: No  ? ? ? ?Allergies   ?Neomycin-polymyxin-gramicidin, Farxiga [dapagliflozin], Neosporin [neomycin-bacitracin zn-polymyx], Sulfa antibiotics, Thimerosal, and Zostavax  [zoster vaccine live] ? ? ?Review of Systems ?Review of Systems  ?Constitutional:  Positive for chills. Negative for fever.  ?  Gastrointestinal:  Negative for abdominal pain, nausea and vomiting.  ?Genitourinary:  Positive for dysuria and frequency. Negative for flank pain, vaginal bleeding and vaginal discharge.  ? ? ?Physical Exam ?Triage Vital Signs ?ED Triage Vitals [03/31/21 1220]  ?Enc Vitals Group  ?   BP 126/71  ?   Pulse Rate 85  ?   Resp 16  ?   Temp 98.6 ?F (37 ?C)  ?   Temp Source Oral  ?   SpO2 95 %  ?   Weight   ?   Height   ?   Head Circumference   ?   Peak Flow   ?   Pain Score   ?   Pain Loc   ?   Pain Edu?   ?   Excl. in [place]?   ? ?No data found. ? ?Updated Vital Signs ?BP 126/71 (BP Location: Left Arm)   Pulse 85   Temp 98.6 ?F (37 ?C) (Oral)   Resp 16   SpO2 95%  ? ?Visual Acuity ?Right Eye Distance:   ?Left Eye Distance:   ?Bilateral Distance:   ? ?Right Eye Near:   ?Left Eye Near:    ?Bilateral Near:    ? ?Physical Exam ?Constitutional:   ?   Appearance: Normal appearance. She is not ill-appearing.  ?Pulmonary:  ?   Effort: Pulmonary effort is normal.  ?Neurological:  ?   Mental Status: She is alert and oriented to person, place, and time.  ?   Gait: Gait normal.  ? ? ? ?UC Treatments / Results  ?Labs ?(all labs ordered are listed, but only abnormal results are displayed) ?Labs Reviewed  ?POCT URINALYSIS DIP (MANUAL ENTRY) - Abnormal; Notable for the following components:  ?    Result Value  ? Clarity, UA clear (*)   ? Leukocytes, UA Small (1+) (*)   ? All other components within normal limits  ?URINE CULTURE   ? ? ?EKG ? ? ?Radiology ?No results found. ? ?Procedures ?Procedures (including critical care time) ? ?Medications Ordered in UC ?Medications - No data to display ? ?Initial Impression / Assessment and Plan / UC Course

## 2021-03-31 NOTE — ED Triage Notes (Signed)
Pt presents with dysuria and urinary frequency since yesterday.  

## 2021-03-31 NOTE — Discharge Instructions (Addendum)
Try the vaginal estrogen - I think it will have a big impact on your symptoms. Follow up with your primary care provider or Gyn if symptoms persist.  ?

## 2021-04-01 LAB — URINE CULTURE: Culture: <10000 — AB

## 2021-04-04 ENCOUNTER — Other Ambulatory Visit: Payer: Self-pay | Admitting: *Deleted

## 2021-04-04 ENCOUNTER — Other Ambulatory Visit: Payer: Self-pay | Admitting: Family Medicine

## 2021-04-04 ENCOUNTER — Telehealth: Payer: Self-pay | Admitting: Family Medicine

## 2021-04-04 DIAGNOSIS — N3281 Overactive bladder: Secondary | ICD-10-CM

## 2021-04-04 DIAGNOSIS — F320 Major depressive disorder, single episode, mild: Secondary | ICD-10-CM

## 2021-04-04 NOTE — Telephone Encounter (Signed)
Referral ordered

## 2021-04-04 NOTE — Telephone Encounter (Signed)
Please advise referral?  

## 2021-04-04 NOTE — Telephone Encounter (Signed)
Copied from [place] [phone]. Topic: Referral - Request for Referral ?>> Apr 04, 2021  8:27 AM Leward Quan A wrote: ?Has patient seen PCP for this complaint? Yes.   ?*If NO, is insurance requiring patient see PCP for this issue before PCP can refer them? ?Referral for which specialty: Urology  ?Preferred provider/office: Dr Hollice Espy  ?Reason for referral: Roland Rack urinary urgency with no UTI gotten worst over the years ?

## 2021-04-15 ENCOUNTER — Telehealth: Payer: Self-pay | Admitting: *Deleted

## 2021-04-15 NOTE — Chronic Care Management (AMB) (Signed)
?  Care Management  ? ?Note ? ?04/15/2021 ?Name: Nicole Young MRN: [id number] DOB: 04/20/1940 ? ?Nicole Young is a 81 y.o. year old female who is a primary care patient of Jerrol Banana., MD. I reached out to Peri Jefferson by phone today offer care coordination services.  ? ?Nicole Young was given information about care management services today including:  ?Care management services include personalized support from designated clinical staff supervised by her physician, including individualized plan of care and coordination with other care providers ?24/7 contact phone numbers for assistance for urgent and routine care needs. ?The patient may stop care management services at any time by phone call to the office staff. ? ?Patient agreed to services and verbal consent obtained.  ? ?Follow up plan: ?Telephone appointment with care management team member scheduled for:04/25/21 ? ?Nicole Young  ?Care Guide, Embedded Care Coordination ?[place]  Care Management  ?Direct Dial: [phone] ? ?

## 2021-04-19 DIAGNOSIS — H353221 Exudative age-related macular degeneration, left eye, with active choroidal neovascularization: Secondary | ICD-10-CM | POA: Diagnosis not present

## 2021-04-22 DIAGNOSIS — S0502XA Injury of conjunctiva and corneal abrasion without foreign body, left eye, initial encounter: Secondary | ICD-10-CM | POA: Diagnosis not present

## 2021-04-25 ENCOUNTER — Ambulatory Visit: Payer: Medicare Other

## 2021-04-25 DIAGNOSIS — E7849 Other hyperlipidemia: Secondary | ICD-10-CM

## 2021-04-25 DIAGNOSIS — E119 Type 2 diabetes mellitus without complications: Secondary | ICD-10-CM

## 2021-04-25 DIAGNOSIS — I10 Essential (primary) hypertension: Secondary | ICD-10-CM

## 2021-04-25 NOTE — Patient Instructions (Signed)
Thank you for allowing the Chronic Care Management team to participate in your care. It was great speaking with you today! ?Please don't hesitate to call if you require additional outreach. ? ? ?Porshe Fleagle,RN ?[place]/[facility] Care Management ?[place] ?([phone] ? ?

## 2021-04-25 NOTE — Chronic Care Management (AMB) (Signed)
 Care Management    RN Visit Note  04/25/2021 Name: Nicole Young MRN: [id number] DOB: 03/28/1940  Subjective: Nicole Young is a 81 y.o. year old female who is a primary care patient of Gilbert, Richard L Jr., MD. The care management team was consulted for assistance with disease management and care coordination needs.    Engaged with patient by telephone for initial visit in response to provider referral for case management and care coordination services.   Consent to Services:   Ms. Nicole Young was given information about Care Management services including:  Care Management services includes personalized support from designated clinical staff supervised by her physician, including individualized plan of care and coordination with other care providers 24/7 contact phone numbers for assistance for urgent and routine care needs. The patient may stop case management services at any time by phone call to the office staff.  Patient agreed to services and consent obtained.   Assessment: Review of patient past medical history, allergies, medications, health status, including review of consultants reports, laboratory and other test data, was performed as part of comprehensive evaluation and provision of chronic care management services.   SDOH (Social Determinants of Health) assessments and interventions performed:  SDOH Interventions    Flowsheet Row Most Recent Value  SDOH Interventions   Food Insecurity Interventions Intervention Not Indicated  Transportation Interventions Intervention Not Indicated        Care Plan  Allergies  Allergen Reactions   Neomycin-Polymyxin-Gramicidin Other (See Comments)    Other Reaction: Other reaction   Farxiga [Dapagliflozin] Other (See Comments)    Weak and dizzy    Neosporin [Neomycin-Bacitracin Zn-Polymyx] Swelling   Sulfa Antibiotics    Thimerosal Swelling   Zostavax  [Zoster Vaccine Live] Rash    Do Not Administer Zostavax to Pt!     Outpatient Encounter Medications as of 04/25/2021  Medication Sig Note   celecoxib (CELEBREX) 200 MG capsule Take 200 mg by mouth daily. Reports taking as needed    estradiol (ESTRACE) 0.1 MG/GM vaginal cream 1 gram intravaginally at bedtime daily for 2-3 weeks, then decrease to 3 times per week    levothyroxine (SYNTHROID) 50 MCG tablet TAKE ONE AND ONE-HALF TABLETS DAILY 04/25/2021: Reports dose was reduced to 50 mcg daily. Currently take one pill daily   metFORMIN (GLUCOPHAGE-XR) 500 MG 24 hr tablet TAKE 1 TABLET TWICE A DAY 04/25/2021: Reports taking 1000 mg once a day (takes two 500 mg tablets)   Multiple Vitamin (MULTIVITAMIN) capsule Take 1 capsule by mouth daily.     Multiple Vitamins-Minerals (PRESERVISION AREDS PO) Take by mouth 2 (two) times daily.    rosuvastatin (CRESTOR) 20 MG tablet TAKE 1 TABLET DAILY    sertraline (ZOLOFT) 100 MG tablet TAKE 1 TABLET DAILY    Accu-Chek FastClix Lancets MISC CHECK SUGAR ONCE DAILY. DX: E11.9    ACCU-CHEK SMARTVIEW test strip USE TO CHECK BLOOD SUGAR ONCE A DAY    Calcium-Vitamin D-Vitamin K (CALCIUM SOFT CHEWS PO) Take by mouth daily.  (Patient not taking: Reported on 04/25/2021)    cholecalciferol (VITAMIN D3) 25 MCG (1000 UT) tablet Take 600 Units by mouth daily. (Patient not taking: Reported on 04/25/2021) 02/17/2020: occasionally   guaiFENesin-codeine (CHERATUSSIN AC) 100-10 MG/5ML syrup Take 5 mLs by mouth at bedtime as needed for cough (up to two doses daily). (Patient not taking: Reported on 03/08/2021)    hydroquinone 4 % cream Apply topically 2 (two) times daily. (Patient not taking: Reported on 04/25/2021)      Magnesium 250 MG TABS Take 1 tablet by mouth daily. (Patient not taking: Reported on 04/25/2021)    methocarbamol (ROBAXIN) 500 MG tablet Robaxin 500 mg tablet  Take 1 tablet twice a day by oral route. 04/25/2021: Reports currently taking once day as needed   mometasone (ELOCON) 0.1 % cream Apply 1 application topically See admin  instructions. Use 1 - 2 times daily until clear, then use as a spot treatment when needed    pantoprazole (PROTONIX) 20 MG tablet TAKE 1 TABLET DAILY 04/25/2021: Reports not taking daily. Taking as needed   No facility-administered encounter medications on file as of 04/25/2021.    Patient Active Problem List   Diagnosis Date Noted   Premature ventricular contractions 08/09/2020   Heart palpitations 07/06/2020   Pure hypercholesterolemia 07/06/2020   COVID-19 virus infection 08/08/2019   Vaginal atrophy 08/16/2018   Rectocele 08/16/2018   Osteoarthritis of knee 10/10/2017   Scoliosis of lumbar spine 07/06/2016   Degeneration of lumbar intervertebral disc 07/06/2016   Arthralgia of multiple joints 11/12/2014   Allergic rhinitis 11/12/2014   Diabetes mellitus ([place]) 11/12/2014   Essential (primary) hypertension 11/12/2014   Personal history of other drug therapy 11/12/2014   HLD (hyperlipidemia) 11/12/2014   Adult hypothyroidism 11/12/2014   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/12/2014   Overweight 11/12/2014   Restless leg 11/12/2014   Encounter for screening colonoscopy 04/17/2014   Type 2 diabetes mellitus ([place]) 12/23/2013     Patient Care Plan: RN Care Management Plan     Problem Identified: HTN, HLD, DM      Long-Range Goal: Disease Progression Prevented or Minimized   Start Date: 04/25/2021  Expected End Date: 04/25/2022  Priority: High  Note:   Current Barriers:  Disease Management  support and education needs related to HTN, HLD, and DMII   RNCM Clinical Goal(s):  Patient will demonstrate ongoing adherence to prescribed treatment plan for HTN, HLD, and DMII.  Interventions: 1:1 collaboration with primary care provider regarding development and update of comprehensive plan of care as evidenced by provider attestation and co-signature Inter-disciplinary care team collaboration (see longitudinal plan of care) Evaluation of current treatment plan related to  self management  and patient's adherence to plan as established by provider    Diabetes Interventions:  (Status:  Goal on track:  Yes. and Patient declined need for further engagement on this goal.)  Assessed patient's understanding of A1c goal: <7% Lab Results  Component Value Date   HGBA1C 6.1 (A) 09/06/2020  Reviewed plan for diabetes management. Reports excellent compliance with medications.  Reviewed blood glucose readings. Reports highest fasting blood sugar over the past few weeks was in the 130's. Reports most fasting readings range from the 100s to 110s.  Discussed nutritional intake and importance of complying with a diabetic diet. Reports doing very well with nutritional intake and diet adherence.   Discussed importance of completing recommended DM preventive care. Reports eye exam is up to date. Completes exams every three months d/t macular degeneration. Reports completing foot care as advised.  Current A1C is within goal. Declined need for additional diabetic resources.     Hyperlipidemia Interventions:  (Status:  Goal on track:  Yes. and Patient declined need for further engagement on this goal.)  Lab Results  Component Value Date   CHOL 131 09/10/2020   HDL 59 09/10/2020   LDLCALC 55 09/10/2020   TRIG 90 09/10/2020   CHOLHDL 2.2 09/10/2020    Medication reviewed. Advised to continue taking as prescribed. Reviewed  provider established cholesterol goals. Discussed importance of completing regular laboratory monitoring as prescribed Reviewed role and benefits of statin for ASCVD risk reduction Discussed strategies to manage statin-induced myalgias. Reports currently tolerating stating well. Reviewed importance of limiting foods high in cholesterol. Reports doing very well with nutritional intake. Reviewed activity goals. Advised to continue engaging in low impact activity as tolerated.   Hypertension Interventions:  (Status:  Goal on track:  Yes. and Patient declined need for further  engagement on this goal.)  Last practice recorded BP readings:  BP Readings from Last 3 Encounters:  03/31/21 126/71  03/08/21 136/65  02/22/21 (!) 116/57  Most recent eGFR/CrCl:  Lab Results  Component Value Date   EGFR 75 03/31/2020    No components found for: CRCL  Reviewed plan for hypertension management.  Provided information regarding established blood pressure parameters along with indications for notifying a provider. Advised to monitor and record readings.  Discussed compliance with recommended cardiac prudent diet. Reports doing very well with nutritional intake. Encouraged to continue reading nutrition labels, monitoring sodium intake and avoiding highly processed foods when possible.  Reviewed s/sx of heart attack, stroke and worsening symptoms that require immediate medical attention.   Patient Goals/Self-Care Activities: Take all medications as prescribed Attend all scheduled provider appointments Call pharmacy for medication refills 3-7 days in advance of running out of medications Perform all self care activities independently  Perform IADL's (shopping, preparing meals, housekeeping, managing finances) independently Call provider office for new concerns or questions         PLAN Mrs. Dadisman will call or contact the clinic if she requires additional outreach. The care management team will gladly assist.   Felecia McCray,RN [place] Care Management [facility] [phone]           

## 2021-04-26 ENCOUNTER — Ambulatory Visit: Payer: Medicare Other | Admitting: Dermatology

## 2021-05-16 ENCOUNTER — Encounter: Payer: Self-pay | Admitting: Urology

## 2021-05-16 ENCOUNTER — Ambulatory Visit (INDEPENDENT_AMBULATORY_CARE_PROVIDER_SITE_OTHER): Payer: Medicare Other | Admitting: Urology

## 2021-05-16 VITALS — BP 146/74 | HR 85 | Ht 60.0 in | Wt 137.0 lb

## 2021-05-16 DIAGNOSIS — R350 Frequency of micturition: Secondary | ICD-10-CM | POA: Diagnosis not present

## 2021-05-16 DIAGNOSIS — R3915 Urgency of urination: Secondary | ICD-10-CM

## 2021-05-16 MED ORDER — GEMTESA 75 MG PO TABS: 75.0000 mg | ORAL_TABLET | Freq: Every day | ORAL | 11 refills | Status: DC

## 2021-05-16 NOTE — Patient Instructions (Signed)

## 2021-05-16 NOTE — Addendum Note (Signed)
Addended by: Alvera Novel on: 05/16/2021 03:17 PM ? ? Modules accepted: Orders ? ?

## 2021-05-16 NOTE — Progress Notes (Signed)
? ?05/16/2021 ?10:24 AM  ? ?[place] ?25-Jul-1940 ?[id number] ? ?Referring provider: Jerrol Banana., MD ?[address] ?[address] ?[place],  [place] [address] ? ?Chief Complaint  ?Patient presents with  ? Urinary Frequency  ? ? ?HPI:  I was consulted to assess the patient's feeling of urgency and fullness.  Many weeks ago she was having a lot of burning and the need to get up 5-6 times a night.  She describes a negative culture and was put on estrogen cream.  Burning almost completely gone and now gets up twice ? ?She feels this fullness like she needs to urinate.  She is continent unless she holds it a long time.  She has never smoked.  Flow varies.  She generally feels empty and double voids a small amount.  Sometimes she has a need to go and then only voids a small amount ? ?She reports a small cystocele and larger rectocele follow-up with watchful waiting ? ?On oral hypoglycemics.  Has not had a hysterectomy.  2 friends of had bladder cancer concerns ? ? ?PMH: ?Past Medical History:  ?Diagnosis Date  ? 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 Complication of anesthesia   ? slow to wake up in past  ? Depression   ? controlled  ? Diabetes mellitus without complication ([facility])   ? controlled  ? Hyperlipidemia   ? Hypothyroidism   ? Neuropathy   ? Osteoarthritis   ? knee, ankles, shoulders, back, neck, hands/fingers, wrists;   ? Rectocele   ? Thyroid disease   ? 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 ? ?Surgical History: ?Past Surgical History:  ?Procedure Laterality Date  ? [place]  ? AUGMENTATION MAMMAPLASTY  2017  ? 2nd augmentation and lift  ? BREAST SURGERY    ? impants replaced with lift  ? CARPAL TUNNEL RELEASE Right 08/26/2014  ? Procedure: CARPAL TUNNEL RELEASE;  Surgeon: Earnestine Leys, MD;  Location: [facility] ORS;  Service: Orthopedics;  Laterality: Right;  ? COLONOSCOPY  2005  ? Dr Bary Castilla  ? COLONOSCOPY N/A 06/24/2014  ? Procedure: COLONOSCOPY;  Surgeon: Robert Bellow,  MD;  Location: [facility] ENDOSCOPY;  Service: Endoscopy;  Laterality: N/A;  ? PARATHYROIDECTOMY  1992  ? PLACEMENT OF BREAST IMPLANTS  1975  ? TONSILECTOMY, ADENOIDECTOMY, BILATERAL MYRINGOTOMY AND TUBES    ? ? ?Home Medications:  ?Allergies as of 05/16/2021   ? ?   Reactions  ? Neomycin-polymyxin-gramicidin Other (See Comments)  ? Other Reaction: Other reaction  ? Wilder Glade [dapagliflozin] Other (See Comments)  ? Weak and dizzy   ? Neosporin [neomycin-bacitracin Zn-polymyx] Swelling  ? Sulfa Antibiotics   ? Thimerosal Swelling  ? Zostavax  [zoster Vaccine Live] Rash  ? Do Not Administer Zostavax to Pt!  ? ?  ? ?  ?Medication List  ?  ? ?  ? Accurate as of May 16, 2021 10:24 AM. If you have any questions, ask your nurse or doctor.  ?  ?  ? ?  ? ?Accu-Chek FastClix Lancets Misc ?CHECK SUGAR ONCE DAILY. DX: E11.9 ?  ?Accu-Chek SmartView test strip ?Generic drug: glucose blood ?USE TO CHECK BLOOD SUGAR ONCE A DAY ?  ?CALCIUM SOFT CHEWS PO ?Take by mouth daily. ?  ?celecoxib 200 MG capsule ?Commonly known as: CELEBREX ?Take 200 mg by mouth daily. Reports taking as needed ?  ?Cheratussin AC 100-10 MG/5ML syrup ?Generic drug: guaiFENesin-codeine ?Take 5 mLs by mouth at bedtime as needed for cough (  up to two doses daily). ?  ?cholecalciferol 25 MCG (1000 UNIT) tablet ?Commonly known as: VITAMIN D3 ?Take 600 Units by mouth daily. ?  ?estradiol 0.1 MG/GM vaginal cream ?Commonly known as: ESTRACE ?1 gram intravaginally at bedtime daily for 2-3 weeks, then decrease to 3 times per week ?  ?hydroquinone 4 % cream ?Apply topically 2 (two) times daily. ?  ?levothyroxine 50 MCG tablet ?Commonly known as: SYNTHROID ?TAKE ONE AND ONE-HALF TABLETS DAILY ?  ?Magnesium 250 MG Tabs ?Take 1 tablet by mouth daily. ?  ?metFORMIN 500 MG 24 hr tablet ?Commonly known as: GLUCOPHAGE-XR ?TAKE 1 TABLET TWICE A DAY ?  ?methocarbamol 500 MG tablet ?Commonly known as: ROBAXIN ?Robaxin 500 mg tablet ? Take 1 tablet twice a day by oral route. ?  ?mometasone 0.1  % cream ?Commonly known as: ELOCON ?Apply 1 application topically See admin instructions. Use 1 - 2 times daily until clear, then use as a spot treatment when needed ?  ?multivitamin capsule ?Take 1 capsule by mouth daily. ?  ?pantoprazole 20 MG tablet ?Commonly known as: PROTONIX ?TAKE 1 TABLET DAILY ?  ?PRESERVISION AREDS PO ?Take by mouth 2 (two) times daily. ?  ?rosuvastatin 20 MG tablet ?Commonly known as: CRESTOR ?TAKE 1 TABLET DAILY ?  ?sertraline 100 MG tablet ?Commonly known as: ZOLOFT ?TAKE 1 TABLET DAILY ?  ? ?  ? ? ?Allergies:  ?Allergies  ?Allergen Reactions  ? Neomycin-Polymyxin-Gramicidin Other (See Comments)  ?  Other Reaction: Other reaction  ? Wilder Glade [Dapagliflozin] Other (See Comments)  ?  Weak and dizzy   ? Neosporin [Neomycin-Bacitracin Zn-Polymyx] Swelling  ? Sulfa Antibiotics   ? Thimerosal Swelling  ? Zostavax  [Zoster Vaccine Live] Rash  ?  Do Not Administer Zostavax to Pt!  ? ? ?Family History: ?Family History  ?Problem Relation Age of Onset  ? Cancer Father   ?     prostate  ? Prostate cancer Father   ? Hypertension Mother   ? Heart attack Mother   ? Heart attack Maternal Grandfather   ? Diabetes Paternal Uncle   ? Congestive Heart Failure Maternal Grandmother   ? CVA Paternal Grandmother   ? Prostate cancer Paternal Grandfather   ? Breast cancer Maternal Aunt   ? ? ?Social History:  reports that she has never smoked. She has never used smokeless tobacco. She reports current alcohol use of about 1.0 standard drink per week. She reports that she does not use drugs. ? ?ROS: ?  ? ?  ? ?  ? ?  ? ?  ? ?  ? ?  ? ?  ? ?  ? ?  ? ?  ? ?  ? ?  ? ?Physical Exam: ?There were no vitals taken for this visit.  ? ?Laboratory Data: ?Lab Results  ?Component Value Date  ? WBC 8.9 09/10/2020  ? HGB 13.4 09/10/2020  ? HCT 41.3 09/10/2020  ? MCV 91 09/10/2020  ? PLT 208 09/10/2020  ? ? ?Lab Results  ?Component Value Date  ? CREATININE 0.79 03/31/2020  ? ? ?No results found for: PSA ? ?No results found for:  TESTOSTERONE ? ?Lab Results  ?Component Value Date  ? HGBA1C 6.1 (A) 09/06/2020  ? ? ?Urinalysis ?   ?Component Value Date/Time  ? BILIRUBINUR negative 03/31/2021 1220  ? BILIRUBINUR negative 05/22/2019 1408  ? KETONESUR negative 03/31/2021 1220  ? PROTEINUR negative 03/31/2021 1220  ? PROTEINUR Negative 05/22/2019 1408  ? UROBILINOGEN 0.2 03/31/2021 1220  ?  NITRITE Negative 03/31/2021 1220  ? NITRITE negative 05/22/2019 1408  ? LEUKOCYTESUR Small (1+) (A) 03/31/2021 1220  ? ? ?Pertinent Imaging: ? Chart reviewed. ? ?Assessment & Plan: Reassess on Gemtesa due to samples for urgency with cystoscopy for safety reasons in 5 to 6 weeks.  Was not able to get a urine and we will try next time ? ?Reassess in 5 to 6 weeks with pelvic examination and cystoscopy  ? ?1. Frequent urination ? ?- Urinalysis, Complete ? ? ?No follow-ups on file. ? ?Reece Packer, MD ? ?[facility] ?[address], [address] ?[place], [place] [address] ?[phone]  ?

## 2021-05-26 ENCOUNTER — Other Ambulatory Visit: Payer: Self-pay | Admitting: Family Medicine

## 2021-05-26 DIAGNOSIS — E119 Type 2 diabetes mellitus without complications: Secondary | ICD-10-CM

## 2021-07-04 ENCOUNTER — Other Ambulatory Visit: Payer: Medicare Other | Admitting: Urology

## 2021-07-18 ENCOUNTER — Encounter: Payer: Self-pay | Admitting: Urology

## 2021-07-18 ENCOUNTER — Ambulatory Visit (INDEPENDENT_AMBULATORY_CARE_PROVIDER_SITE_OTHER): Payer: Medicare Other | Admitting: Urology

## 2021-07-18 VITALS — BP 126/78 | HR 89 | Ht 60.0 in | Wt 137.0 lb

## 2021-07-18 DIAGNOSIS — R350 Frequency of micturition: Secondary | ICD-10-CM

## 2021-07-18 DIAGNOSIS — N8189 Other female genital prolapse: Secondary | ICD-10-CM | POA: Diagnosis not present

## 2021-07-18 DIAGNOSIS — R351 Nocturia: Secondary | ICD-10-CM | POA: Diagnosis not present

## 2021-07-18 NOTE — Progress Notes (Signed)
07/18/2021 9:28 AM   [place] [birth year] [id number]  Referring provider: Jerrol Banana., MD [address] [place],  [place] [address]  Chief Complaint  Patient presents with   Cysto    HPI: I was consulted to assess the patient's feeling of urgency and fullness.  Many weeks ago she was having a lot of burning and the need to get up 5-6 times a night.  She describes a negative culture and was put on estrogen cream.  Burning almost completely gone and now gets up twice  She feels this fullness like she needs to urinate.  She is continent unless she holds it a long time.  She has never smoked.  Flow varies.  She generally feels empty and double voids a small amount.  Sometimes she has a need to go and then only voids a small amount   She reports a small cystocele and larger rectocele follow-up with watchful waiting  On oral hypoglycemics.  Has not had a hysterectomy.  2 friends of had bladder cancer concerns    Reassess on Gemtesa due to samples for urgency with cystoscopy for safety reasons in 5 to 6 weeks.  Was not able to get a urine and we will try next time   Today Patient is only getting up twice instead of 5 times.  Urgency much better.  Burning is gone.  She uses estradiol  and is also on the Gemtesa samples.  She wonders if the estradiol is helping a lot.  Well supported bladder neck.  No cystocele or stress incontinence.  Large grade 2 rectocele  Patient underwent flexible cystoscopy.  Bladder mucosa and trigone were normal.  No stitch foreign body or carcinoma.  No cystitis.  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[facility])    Complication of anesthesia    slow to wake up in past   Depression    controlled   Diabetes mellitus without complication ([facility])    controlled   Hyperlipidemia    Hypothyroidism    Neuropathy    Osteoarthritis    knee, ankles, shoulders, back, neck,  hands/fingers, wrists;    Rectocele    Thyroid disease    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[place])     Surgical History: Past Surgical History:  Procedure Laterality Date   AUGMENTATION MAMMAPLASTY  1975   AUGMENTATION MAMMAPLASTY  2017   2nd augmentation and lift   BREAST SURGERY     impants replaced with lift   CARPAL TUNNEL RELEASE Right 08/26/2014   Procedure: CARPAL TUNNEL RELEASE;  Surgeon: Earnestine Leys, MD;  Location: [facility] ORS;  Service: Orthopedics;  Laterality: Right;   COLONOSCOPY  2005   Dr Bary Castilla   COLONOSCOPY N/A 06/24/2014   Procedure: COLONOSCOPY;  Surgeon: Robert Bellow, MD;  Location: [facility] ENDOSCOPY;  Service: Endoscopy;  Laterality: N/A;   PARATHYROIDECTOMY  1992   PLACEMENT OF BREAST IMPLANTS  1975   TONSILECTOMY, ADENOIDECTOMY, BILATERAL MYRINGOTOMY AND TUBES      Home Medications:  Allergies as of 07/18/2021       Reactions   Neomycin-polymyxin-gramicidin Other (See Comments)   Other Reaction: Other reaction   Farxiga [dapagliflozin] Other (See Comments)   Weak and dizzy    Neosporin [neomycin-bacitracin Zn-polymyx] Swelling   Sulfa Antibiotics    Thimerosal Swelling   Zostavax  [zoster Vaccine Live] Rash   Do Not Administer Zostavax to Pt!  Medication List        Accurate as of July 18, 2021  9:28 AM. If you have any questions, ask your nurse or doctor.          Accu-Chek FastClix Lancets Misc CHECK SUGAR ONCE DAILY. DX: E11.9   Accu-Chek SmartView test strip Generic drug: glucose blood USE TO CHECK BLOOD SUGAR ONCE A DAY   CALCIUM SOFT CHEWS PO Take by mouth daily.   celecoxib 200 MG capsule Commonly known as: CELEBREX Take 200 mg by mouth daily. Reports taking as needed   Cheratussin AC 100-10 MG/5ML syrup Generic drug: guaiFENesin-codeine Take 5 mLs by mouth at bedtime as needed for cough (up to two doses daily).   cholecalciferol 25 MCG (1000 UNIT) tablet Commonly known as: VITAMIN D3 Take 600 Units by mouth  daily.   estradiol 0.1 MG/GM vaginal cream Commonly known as: ESTRACE 1 gram intravaginally at bedtime daily for 2-3 weeks, then decrease to 3 times per week   Gemtesa 75 MG Tabs Generic drug: Vibegron Take 75 mg by mouth daily.   hydroquinone 4 % cream Apply topically 2 (two) times daily.   levothyroxine 50 MCG tablet Commonly known as: SYNTHROID TAKE ONE AND ONE-HALF TABLETS DAILY   Magnesium 250 MG Tabs Take 1 tablet by mouth daily.   metFORMIN 500 MG 24 hr tablet Commonly known as: GLUCOPHAGE-XR TAKE 1 TABLET TWICE A DAY   methocarbamol 500 MG tablet Commonly known as: ROBAXIN Robaxin 500 mg tablet  Take 1 tablet twice a day by oral route.   mometasone 0.1 % cream Commonly known as: ELOCON Apply 1 application topically See admin instructions. Use 1 - 2 times daily until clear, then use as a spot treatment when needed   multivitamin capsule Take 1 capsule by mouth daily.   pantoprazole 20 MG tablet Commonly known as: PROTONIX TAKE 1 TABLET DAILY   PRESERVISION AREDS PO Take by mouth 2 (two) times daily.   rosuvastatin 20 MG tablet Commonly known as: CRESTOR TAKE 1 TABLET DAILY   sertraline 100 MG tablet Commonly known as: ZOLOFT TAKE 1 TABLET DAILY        Allergies:  Allergies  Allergen Reactions   Neomycin-Polymyxin-Gramicidin Other (See Comments)    Other Reaction: Other reaction   Farxiga [Dapagliflozin] Other (See Comments)    Weak and dizzy    Neosporin [Neomycin-Bacitracin Zn-Polymyx] Swelling   Sulfa Antibiotics    Thimerosal Swelling   Zostavax  [Zoster Vaccine Live] Rash    Do Not Administer Zostavax to Pt!    Family History: Family History  Problem Relation Age of Onset   Cancer Father        prostate   Prostate cancer Father    Hypertension Mother    Heart attack Mother    Heart attack Maternal Grandfather    Diabetes Paternal Uncle    Congestive Heart Failure Maternal Grandmother    CVA Paternal Grandmother    Prostate  cancer Paternal Grandfather    Breast cancer Maternal Aunt     Social History:  reports that she has never smoked. She has never used smokeless tobacco. She reports current alcohol use of about 1.0 standard drink of alcohol per week. She reports that she does not use drugs.  ROS:                                        Physical  Exam: There were no vitals taken for this visit.   Laboratory Data: Lab Results  Component Value Date   WBC 8.9 09/10/2020   HGB 13.4 09/10/2020   HCT 41.3 09/10/2020   MCV 91 09/10/2020   PLT 208 09/10/2020    Lab Results  Component Value Date   CREATININE 0.79 03/31/2020    No results found for: "PSA"  No results found for: "TESTOSTERONE"  Lab Results  Component Value Date   HGBA1C 6.1 (A) 09/06/2020    Urinalysis    Component Value Date/Time   BILIRUBINUR negative 03/31/2021 1220   BILIRUBINUR negative 05/22/2019 1408   KETONESUR negative 03/31/2021 1220   PROTEINUR negative 03/31/2021 1220   PROTEINUR Negative 05/22/2019 1408   UROBILINOGEN 0.2 03/31/2021 1220   NITRITE Negative 03/31/2021 1220   NITRITE negative 05/22/2019 1408   LEUKOCYTESUR Small (1+) (A) 03/31/2021 1220    Pertinent Imaging:   Assessment & Plan: I think it is reasonable to stop the Gemtesa.  See if she does well on estradiol.  Reassess in 3 months.  Can always go back on overactive bladder medication  1. Frequent urination  - Urinalysis, Complete   No follow-ups on file.  Reece Packer, MD  [facility] [address], [place], [place] [address] [phone]

## 2021-07-18 NOTE — Addendum Note (Signed)
Addended by: Despina Hidden on: 07/18/2021 10:57 AM   Modules accepted: Orders

## 2021-07-19 DIAGNOSIS — H353221 Exudative age-related macular degeneration, left eye, with active choroidal neovascularization: Secondary | ICD-10-CM | POA: Diagnosis not present

## 2021-07-19 LAB — URINALYSIS, COMPLETE
Bilirubin, UA: NEGATIVE
Glucose, UA: NEGATIVE
Ketones, UA: NEGATIVE
Leukocytes,UA: NEGATIVE
Nitrite, UA: NEGATIVE
Protein,UA: NEGATIVE
RBC, UA: NEGATIVE
Specific Gravity, UA: 1.015 (ref 1.005–1.030)
Urobilinogen, Ur: 1 mg/dL (ref 0.2–1.0)
pH, UA: 7 (ref 5.0–7.5)

## 2021-07-19 LAB — MICROSCOPIC EXAMINATION
Bacteria, UA: NONE SEEN
RBC, Urine: NONE SEEN /hpf (ref 0–2)

## 2021-07-29 DIAGNOSIS — Z23 Encounter for immunization: Secondary | ICD-10-CM | POA: Diagnosis not present

## 2021-08-03 ENCOUNTER — Ambulatory Visit: Payer: Medicare Other | Attending: Urology

## 2021-08-03 DIAGNOSIS — M6289 Other specified disorders of muscle: Secondary | ICD-10-CM | POA: Insufficient documentation

## 2021-08-03 DIAGNOSIS — R278 Other lack of coordination: Secondary | ICD-10-CM | POA: Insufficient documentation

## 2021-08-03 DIAGNOSIS — N8189 Other female genital prolapse: Secondary | ICD-10-CM | POA: Insufficient documentation

## 2021-08-03 DIAGNOSIS — M6281 Muscle weakness (generalized): Secondary | ICD-10-CM | POA: Insufficient documentation

## 2021-08-03 DIAGNOSIS — M5459 Other low back pain: Secondary | ICD-10-CM | POA: Diagnosis not present

## 2021-08-03 NOTE — Therapy (Signed)
OUTPATIENT PHYSICAL THERAPY FEMALE PELVIC EVALUATION   Patient Name: Nicole Young MRN: [id number] DOB:02/22/1940, 81 y.o., female Today's Date: 08/03/2021   PT End of Session - 08/03/21 1620     Visit Number 1    Number of Visits 12    Date for PT Re-Evaluation 10/26/21    Authorization Type IE: 08/03/21    PT Start Time 1617    PT Stop Time 1655    PT Time Calculation (min) 38 min    Activity Tolerance Patient tolerated treatment well             Past Medical History:  Diagnosis Date   AMD (acid maltase deficiency) (HCC)    Complication of anesthesia    slow to wake up in past   Depression    controlled   Diabetes mellitus without complication ([place])    controlled   Hyperlipidemia    Hypothyroidism    Neuropathy    Osteoarthritis    knee, ankles, shoulders, back, neck, hands/fingers, wrists;    Rectocele    Thyroid disease    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[place])    Past Surgical History:  Procedure Laterality Date   AUGMENTATION MAMMAPLASTY  1975   AUGMENTATION MAMMAPLASTY  2017   2nd augmentation and lift   BREAST SURGERY     impants replaced with lift   CARPAL TUNNEL RELEASE Right 08/26/2014   Procedure: CARPAL TUNNEL RELEASE;  Surgeon: Earnestine Leys, MD;  Location: [facility] ORS;  Service: Orthopedics;  Laterality: Right;   COLONOSCOPY  2005   Dr Bary Castilla   COLONOSCOPY N/A 06/24/2014   Procedure: COLONOSCOPY;  Surgeon: Robert Bellow, MD;  Location: [facility] ENDOSCOPY;  Service: Endoscopy;  Laterality: N/A;   PARATHYROIDECTOMY  1992   PLACEMENT OF BREAST IMPLANTS  1975   TONSILECTOMY, ADENOIDECTOMY, BILATERAL MYRINGOTOMY AND TUBES     Patient Active Problem List   Diagnosis Date Noted   Premature ventricular contractions 08/09/2020   Heart palpitations 07/06/2020   Pure hypercholesterolemia 07/06/2020   COVID-19 virus infection 08/08/2019   Vaginal atrophy 08/16/2018   Rectocele 08/16/2018   Osteoarthritis of knee 10/10/2017   Scoliosis of lumbar  spine 07/06/2016   Degeneration of lumbar intervertebral disc 07/06/2016   Arthralgia of multiple joints 11/12/2014   Allergic rhinitis 11/12/2014   Diabetes mellitus ([place]) 11/12/2014   Essential (primary) hypertension 11/12/2014   Personal history of other drug therapy 11/12/2014   HLD (hyperlipidemia) 11/12/2014   Adult hypothyroidism 11/12/2014   Mild major depression ([place]) 11/12/2014   Overweight 11/12/2014   Restless leg 11/12/2014   Encounter for screening colonoscopy 04/17/2014   Type 2 diabetes mellitus ([place]) 12/23/2013    PCP: Jerrol Banana, MD  REFERRING PROVIDER: Bjorn Loser, MD   REFERRING DIAG:  N81.89 (ICD-10-CM) - Weakness of pelvic floor   THERAPY DIAG:  Other lack of coordination  Pelvic floor dysfunction  Other low back pain  Muscle weakness (generalized)  Rationale for Evaluation and Treatment: Rehabilitation  ONSET DATE: More than 2 years ago  RED FLAGS: N/A Have you had any night sweats? Unexplained weight loss? Saddle anesthesia? Unexplained changes in bowel or bladder habits?   SUBJECTIVE: Patient confirms identification and approves PT to assess pelvic floor and treatment Yes  PRECAUTIONS: None  WEIGHT BEARING RESTRICTIONS: No  FALLS:  Has patient fallen in last 6 months? No  OCCUPATION/SOCIAL ACTIVITIES: Retired, sitting/reading, walking (7,000-10,000 steps)   PLOF: Independent   CHIEF CONCERN: Pt had been noticing more nocturia about every 40 mins-hour through the night and had a burning sensation. Pt went into a walk-in clinic and tested negative for UTI. Pt was given estradiol cream and that helped with the burning sensation but is concerned for bladder cancer. Pt then looked for help at an urologist because she was still having urinary  urgency and tried OAB given by Dr but felt it is helping just as much as the cream is. Pt feels bulging tissue from vaginal opening especially when she puts the estradiol cream on but has no pain with exposed tissue.    PAIN:  Are you having pain? Yes NPRS scale: 0/10 (current), 9/10 (worst) Pain location: low back   Pain type: sharp Pain description: intermittent, constant, and sharp   Aggravating factors: standing for longer periods of time, bending over Relieving factors: rest, sometimes Tylenol    LIVING ENVIRONMENT: Lives with: lives alone Lives in: House/apartment    PATIENT GOALS: Pt would like not to have the burning sensation at the vagina, work on the constipation, and work on the low back pain as well as make her prolapse better if she could    UROLOGICAL HISTORY Fluid intake: coffee, water, tea   Pain with urination: Yes Fully empty bladder: no Stream: dribbling, and weak  Urgency: No  Frequency: every 2-3 hours  Nocturia: 2x Leakage: No Pads: No Bladder control (0-10): 8/10  GASTROINTESTINAL HISTORY Pain with bowel movement: Yes, pain around anus has hx of hemorrhoids  Type of bowel movement:Type (Bristol Stool Scale) 2 and 3 and Strain Yes Toileting posture: heels raised  Frequency: every other day Fully empty rectum: Yes Leakage: Yes if have very loose stool, feels she passes gas then will leak, but not usually  Pads: Yes but only when it happens, they she will wear a pantyliner for a few days   SEXUAL HISTORY/FUNCTION Pt has no concerns   OBSTETRICAL HISTORY Vaginal deliveries: G4P3 Tearing: episiotomy, stitch   GYNECOLOGICAL HISTORY Hysterectomy: no Pelvic Organ Prolapse: Rectocele Grade II large Pain with exam: yes no Heaviness/pressure: no   OBJECTIVE:   DIAGNOSTIC TESTING/IMPRESSIONS:    COGNITION: Overall cognitive status: Within functional limits for tasks assessed     POSTURE:  In sitting, B plantarflexion  Lumbar  lordosis:  Deferred 2/2 time constraints  Thoracic kyphosis: Iliac crest height:  Lumbar lateral shift:  Pelvic obliquity:  Leg length discrepancy:   GAIT: Deferred 2/2 time constraints Distance walked:  Comments:   Trendelenburg:   SENSATION: Deferred 2/2 time constraints Light touch: , L2-S2 dermatomes  Proprioception:    RANGE OF MOTION: Deferred 2/2 time constraints  (Norm range in degrees)  LEFT  RIGHT   Lumbar forward flexion (65):      Lumbar extension (30):     Lumbar lateral flexion (25):     Thoracic and Lumbar rotation (30 degrees):       Hip Flexion (0-125):      Hip IR (0-45):     Hip ER (0-45):     Hip Adduction:      Hip Abduction (0-40):     Hip extension (0-15):     (*= pain, Blank rows = not tested)   STRENGTH: MMT  Deferred 2/2 time constraints  RLE LLE   Hip Flexion  Hip Extension    Hip Abduction     Hip Adduction     Hip ER     Hip IR     Knee Extension    Knee Flexion    Dorsiflexion     Plantarflexion (seated)    (*= pain, Blank rows = not tested)   SPECIAL TESTS: Deferred 2/2 time constraints Centralization and Peripheralization (SN 92, -LR 0.12):  Slump (SN 83, -LR 0.32):  SLR (SN 92, -LR 0.29): R: Lumbar quadrant (SN 70): R:  FABER (SN 81): FADIR (SN 94):  Hip scour (SN 50):  Thigh Thrust (SN 88, -LR 0.18) : Distraction (XH37):  Compression (SN/SP 69): Stork/March (SP 93):   PALPATION: Deferred 2/2 time constraints Abdominal:  Diastasis:  finger above umbilicus,  fingers at and below umbilicus  Scar mobility: present/mobile perpendicular, parallel Rib flare: present/absent  EXTERNAL PELVIC EXAM: Patient educated on the purpose of the pelvic exam and articulated understanding; patient consented to the exam verbally. Deferred 2/2 time constraints Palpation: Breath coordination: present/absent/inconsistent Voluntary Contraction: present/absent Relaxation: full/delayed/non-relaxing Perineal movement with sustained  IAP increase ("bear down"): descent/no change/elevation/excessive descent Perineal movement with rapid IAP increase ("cough"): elevation/no change/descent Pubic symphysis: (0= no contraction, 1= flicker, 2= weak squeeze, 3= fair squeeze with lift, 4= good squeeze and lift against resistance, 5= strong squeeze against strong resistance)   INTERNAL PELVIC EXAM: Patient educated on the purpose of the pelvic exam and articulated understanding; patient consented to the exam verbally. Not applicable to Pt at this time. Introitus Appears:  Skin integrity:  Scar mobility: Strength (PERF):  Symmetry: Palpation: Prolapse: (0= no contraction, 1= flicker, 2= weak squeeze, 3= fair squeeze with lift, 4= good squeeze and lift against resistance, 5= strong squeeze against strong resistance)    Patient Education:  Patient educated on what to expect during course of physical therapy, POC, and provided with HEP including: toileting posture and seated diaphragmatic breathing while toileting. Patient verbalized understanding and returned demonstration. Patient will benefit from further education in order to maximize compliance and understanding for long-term therapeutic gains.  Patient Surveys:  FOTO Urinary Problem - 56  FOTO Bowel Constipation - 49  FOTO Bowel Leakage - 22   FOTO Lumbar Spine - 50     ASSESSMENT:  Clinical Impression: Patient is a 81 y.o. who was seen today for physical therapy evaluation and treatment for a chief concern of a prolapse. Today's evaluation suggest deficits in IAP management, PFM coordination, PFM extensibility, PFM strength, posture, pain, and scar mobility as evidenced by large Grade II rectocele, B plantarflexion in sitting (increase PFM tension), burning sensation and painful urination, hx of constipation and straining causing hemmorhoids, occasional bowel leakage, weak/dribbling stream during urination, hx of episiotomy and tearing (3 vaginal births) and heels raised  while toileting. Patient's responses on FOTO Urinary Problem (56), Bowel Constipation (49), Bowel Leakage (58), and Lumbar Spine (50) all indicate moderate limitation/disability/distress. Patient's progress may be limited due to time since onset; however, patient's motivation is advantageous. Pt with basic understanding of PFM function in bowel/bladder habits, the deep core, posture, and the diaphragm. Patient will benefit from skilled therapeutic intervention to address deficits in IAP management, PFM coordination, PFM extensibility, PFM strength, posture, pain, and scar mobility in order to increase PLOF and improve overall QOL.    Objective Impairments: decreased coordination, decreased endurance, decreased strength, increased muscle spasms, improper body mechanics, postural dysfunction, and pain.   Activity Limitations: lifting, bending, standing, continence, toileting, and locomotion level  Personal Factors:  Age, Fitness, Past/current experiences, Time since onset of injury/illness/exacerbation, and 1-2 comorbidities: scoliosis of lumbar spine and hyperlipidemia  are also affecting patient's functional outcome.   Rehab Potential: Good  Clinical Decision Making: Evolving/moderate complexity  Evaluation Complexity: Moderate   GOALS: Goals reviewed with patient? Yes  SHORT TERM GOALS: Target date: 09/14/2021  Patient will demonstrate independence with HEP in order to maximize therapeutic gains and improve carryover from physical therapy sessions to ADLs in the home and community. Baseline: toileting posture and seated diaphragmatic breathing during toileting Goal status: INITIAL   LONG TERM GOALS: Target date: 10/26/2021   Patient will demonstrate circumferential and sequential contraction of >3/5 MMT, > 5 sec hold x5 and 5 consecutive quick flicks with </= 10 min rest between testing bouts, and relaxation of the PFM coordinated with breath for improved management of intra-abdominal  pressure and normal bowel and bladder function without the presence of pain nor incontinence in order to improve participation at home and in the community. Baseline: will assess next visit Goal status: INITIAL  2.  Patient will demonstrate coordinated lengthening and relaxation of PFM with diaphragmatic inhalation in order to decrease spasm and allow for unrestricted elimination of urine/feces for improved overall QOL. Baseline: will assess next visit  Goal status: INITIAL  3.  Patient will decrease worst pain as reported on NPRS by at least 2 points to demonstrate clinically significant reduction in pain in order to restore/improve function and overall QOL. Baseline: LBP 9/10 sharp/stabbing  Goal status: INITIAL  4.  Patient will report less than 2 incidents of fecal incontinence over the course of 4 weeks after flatulation in order to demonstrate improved PFM coordination, PFM strength, and function for improved overall QOL. Baseline: up to 2x per month and will wear pantyliners for 2 days Goal status: INITIAL  5.  Patient will be able to articulate and demonstrate 3-5 postures that encourage gravity assisted repositioning of pelvic organs to decrease frequency and intensity of low back discomfort in order to participate more fully in activities at home and in the community.  Baseline: 9/10 LBP/Grade II rectocele  Goal status: INITIAL  6.  Patient will improve scores on FOTO Urinary Problem/Bowel Constipation/Bowel Leakage/Lumbar Spine by at least 10 points  in order to demonstrate improved IAP management, improved PFM coordination, improved strength and overall QOL.  Baseline: target scores in order (66, 59, 67, 61)  Goal status: INITIAL  PLAN: PT Frequency: 1x/week  PT Duration: 12 weeks  Planned Interventions: Therapeutic exercises, Therapeutic activity, Neuromuscular re-education, Balance training, Gait training, Patient/Family education, Self Care, Joint mobilization, Spinal  mobilization, Cryotherapy, Moist heat, scar mobilization, Taping, and Manual therapy  Plan For Next Session: phy asses   Rainy Rothman, PT, DPT  08/03/2021, 5:03 PM

## 2021-08-04 DIAGNOSIS — M7542 Impingement syndrome of left shoulder: Secondary | ICD-10-CM | POA: Diagnosis not present

## 2021-08-10 ENCOUNTER — Ambulatory Visit: Payer: Medicare Other

## 2021-08-15 ENCOUNTER — Ambulatory Visit: Payer: Self-pay

## 2021-08-15 NOTE — Chronic Care Management (AMB) (Signed)
   08/15/2021  [place] 1940-03-01 [id number]  Documentation encounter created to complete case transition. The care management team will continue to follow Nicole Young for care coordination.   [facility] [phone]

## 2021-08-31 ENCOUNTER — Ambulatory Visit: Payer: Medicare Other

## 2021-09-02 NOTE — Progress Notes (Unsigned)
Established patient visit  I,Nicole Young,acting as a scribe for Nicole Durie, MD.,have documented all relevant documentation on the behalf of Nicole Durie, MD,as directed by  Nicole Durie, MD while in the presence of Nicole Durie, MD.   Patient: Nicole Young   DOB: 05-Apr-1940   81 y.o. Female  MRN: [id number] Visit Date: 09/05/2021  Today's healthcare provider: [name], MD   Chief Complaint  Patient presents with   Follow-up   Hypertension   Hyperlipidemia   Subjective    HPI  Patient just got back from [place] cruise 1 week ago but during the last few days of the cruise got sick and has had a productive cough since then.  The cough is finally starting to feel better over the past day or 2 and she has had no fevers or shortness of breath.  She states she is tested negative for COVID. She states every time she has traveled in recent years she has gotten sick. She is frustrated that insurance is not covering her estradiol cream to last longer than a month or 3 months that is covered.  She is using it 2-3 times per week as directed .  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  We also discussed using the dab in the periurethral opening.  We will leave this as is.  She is followed by endocrinology for her history of diabetes.  She states her sugars have been up with fastings in the 140s since she has been sick.  Explained this was a normal response  Patient had AWV with [facility] on 02/22/2021.   Hypertension, follow-up  BP Readings from Last 3 Encounters:  09/05/21 (!) 123/58  07/18/21 126/78  05/16/21 (!) 146/74   Wt Readings from Last 3 Encounters:  09/05/21 141 lb (64 kg)  07/18/21 137 lb (62.1 kg)  05/16/21 137 lb (62.1 kg)     She was last seen for hypertension 6 months ago.  Management since that visit includes; Excellent control, urine microalbumin on next visit.  Outside blood pressures are  122/75.  --------------------------------------------------------------------------------------------------- Lipid/Cholesterol, follow-up  Last Lipid Panel: Lab Results  Component Value Date   CHOL 131 09/10/2020   LDLCALC 55 09/10/2020   HDL 59 09/10/2020   TRIG 90 09/10/2020    She was last seen for this 6 months ago.  Management since that visit includes; On rosuvastatin 20 mg.  Last metabolic panel Lab Results  Component Value Date   GLUCOSE 114 (H) 03/31/2020   NA 137 03/31/2020   K 4.4 03/31/2020   BUN 15 03/31/2020   CREATININE 0.79 03/31/2020   EGFR 75 03/31/2020   GFRNONAA 83 06/02/2019   CALCIUM 9.4 03/31/2020   AST 20 03/31/2020   ALT 17 03/31/2020   The ASCVD Risk score (Arnett DK, et al., 2019) failed to calculate for the following reasons:   The 2019 ASCVD risk score is only valid for ages 56 to 86  ---------------------------------------------------------------------------------------------------  Medications: Outpatient Medications Prior to Visit  Medication Sig   Accu-Chek FastClix Lancets [place]. DX: E11.9   ACCU-CHEK SMARTVIEW test strip USE TO CHECK BLOOD SUGAR ONCE A DAY   celecoxib (CELEBREX) 200 MG capsule Take 200 mg by mouth daily. Reports taking as needed   cholecalciferol (VITAMIN D3) 25 MCG (1000 UT) tablet Take 600 Units by mouth daily.   estradiol (ESTRACE) 0.1 MG/GM vaginal cream 1 gram intravaginally at bedtime daily for 2-3 weeks, then  decrease to 3 times per week   guaiFENesin (MUCINEX PO) Take by mouth.   guaiFENesin-codeine (CHERATUSSIN AC) 100-10 MG/5ML syrup Take 5 mLs by mouth at bedtime as needed for cough (up to two doses daily).   hydroquinone 4 % cream Apply topically 2 (two) times daily.   levothyroxine (SYNTHROID) 50 MCG tablet TAKE ONE AND ONE-HALF TABLETS DAILY (Patient taking differently: Take 50 mcg by mouth daily before breakfast.)   Magnesium 250 MG TABS Take 1 tablet by mouth daily.   metFORMIN  (GLUCOPHAGE-XR) 500 MG 24 hr tablet TAKE 1 TABLET TWICE A DAY (Patient taking differently: 500 mg 2 (two) times daily before a meal.)   methocarbamol (ROBAXIN) 500 MG tablet as needed.   mometasone (ELOCON) 0.1 % cream Apply 1 application topically See admin instructions. Use 1 - 2 times daily until clear, then use as a spot treatment when needed   Multiple Vitamin (MULTIVITAMIN) capsule Take 1 capsule by mouth daily.   Multiple Vitamins-Minerals (PRESERVISION AREDS PO) Take by mouth 2 (two) times daily.   rosuvastatin (CRESTOR) 20 MG tablet TAKE 1 TABLET DAILY   sertraline (ZOLOFT) 100 MG tablet TAKE 1 TABLET DAILY   [DISCONTINUED] Calcium-Vitamin D-Vitamin K (CALCIUM SOFT CHEWS PO) Take by mouth daily. (Patient not taking: Reported on 08/03/2021)   [DISCONTINUED] Vibegron (GEMTESA) 75 MG TABS Take 75 mg by mouth daily. (Patient not taking: Reported on 08/03/2021)   No facility-administered medications prior to visit.    Review of Systems  Constitutional:  Positive for activity change, appetite change and fatigue.  HENT:  Positive for congestion, ear pain, rhinorrhea and sinus pressure.   Respiratory:  Positive for cough and wheezing.   Musculoskeletal:  Positive for arthralgias and back pain.  Neurological:  Positive for light-headedness.  All other systems reviewed and are negative.   Last hemoglobin A1c Lab Results  Component Value Date   HGBA1C 6.1 (A) 09/06/2020       Objective    BP (!) 123/58 (BP Location: Left Arm, Patient Position: Sitting, Cuff Size: Normal)   Pulse 84   Resp 16   Ht 5' (1.524 m)   Wt 141 lb (64 kg)   SpO2 96%   BMI 27.54 kg/m  BP Readings from Last 3 Encounters:  09/05/21 (!) 123/58  07/18/21 126/78  05/16/21 (!) 146/74   Wt Readings from Last 3 Encounters:  09/05/21 141 lb (64 kg)  07/18/21 137 lb (62.1 kg)  05/16/21 137 lb (62.1 kg)      Physical Exam Vitals reviewed.  Constitutional:      Appearance: She is well-developed.  HENT:      Head: Normocephalic and atraumatic.     Right Ear: External ear normal.     Left Ear: External ear normal.     Nose: Nose normal.  Eyes:     General: No scleral icterus.    Conjunctiva/sclera: Conjunctivae normal.  Neck:     Thyroid: No thyromegaly.  Cardiovascular:     Rate and Rhythm: Normal rate and regular rhythm.     Heart sounds: Normal heart sounds.  Pulmonary:     Effort: Pulmonary effort is normal.     Breath sounds: Normal breath sounds.  Abdominal:     Palpations: Abdomen is soft.     Tenderness: Negative signs include Murphy's sign and McBurney's sign.  Musculoskeletal:     Right lower leg: No edema.     Left lower leg: No edema.  Skin:    General: Skin is  warm and dry.  Neurological:     General: No focal deficit present.     Mental Status: She is alert and oriented to person, place, and time.  Psychiatric:        Mood and Affect: Mood normal.        Behavior: Behavior normal.        Thought Content: Thought content normal.        Judgment: Judgment normal.       No results found for any visits on 09/05/21.  Assessment & Plan     1. Essential (primary) hypertension  - Lipid panel - CBC w/Diff/Platelet - Comprehensive Metabolic Panel (CMET)  2. Mixed hyperlipidemia  - Lipid panel - CBC w/Diff/Platelet - Comprehensive Metabolic Panel (CMET)  3. Adult hypothyroidism  - Lipid panel - CBC w/Diff/Platelet - Comprehensive Metabolic Panel (CMET)  4. Type 2 diabetes mellitus without complication, without long-term current use of insulin (HCC)  - Lipid panel - CBC w/Diff/Platelet - Comprehensive Metabolic Panel (CMET)  5. Allergic rhinitis due to pollen, unspecified seasonality  - Lipid panel - CBC w/Diff/Platelet - Comprehensive Metabolic Panel (CMET)  6. Neuropathy  - Lipid panel - CBC w/Diff/Platelet - Comprehensive Metabolic Panel (CMET)  7. Mild major depression (HCC)  - Lipid panel - CBC w/Diff/Platelet - Comprehensive Metabolic  Panel (CMET)  8. High risk medication use  - Lipid panel - CBC w/Diff/Platelet - Comprehensive Metabolic Panel (CMET)  9. Acute cough/URI I discussed with patient I think this is all viral.  We will get a chest x-ray and if she has pneumonia we will treat with doxycycline.  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         Richard Cranford Mon, MD  [facility] [phone] (phone) [phone] (fax)  [place]

## 2021-09-05 ENCOUNTER — Ambulatory Visit (INDEPENDENT_AMBULATORY_CARE_PROVIDER_SITE_OTHER): Payer: Medicare Other | Admitting: Family Medicine

## 2021-09-05 ENCOUNTER — Ambulatory Visit
Admission: RE | Admit: 2021-09-05 | Discharge: 2021-09-05 | Disposition: A | Discharge status: Home / Self Care | Payer: Medicare Other | Attending: Family Medicine | Admitting: Family Medicine

## 2021-09-05 ENCOUNTER — Ambulatory Visit
Admission: RE | Admit: 2021-09-05 | Discharge: 2021-09-05 | Disposition: A | Discharge status: Home / Self Care | Payer: Medicare Other | Source: Ambulatory Visit | Attending: Family Medicine | Admitting: Family Medicine

## 2021-09-05 ENCOUNTER — Encounter: Payer: Self-pay | Admitting: Family Medicine

## 2021-09-05 VITALS — BP 123/58 | HR 84 | Resp 16 | Ht 60.0 in | Wt 141.0 lb

## 2021-09-05 DIAGNOSIS — J301 Allergic rhinitis due to pollen: Secondary | ICD-10-CM | POA: Diagnosis not present

## 2021-09-05 DIAGNOSIS — F320 Major depressive disorder, single episode, mild: Secondary | ICD-10-CM | POA: Diagnosis not present

## 2021-09-05 DIAGNOSIS — Z79899 Other long term (current) drug therapy: Secondary | ICD-10-CM | POA: Diagnosis not present

## 2021-09-05 DIAGNOSIS — E039 Hypothyroidism, unspecified: Secondary | ICD-10-CM

## 2021-09-05 DIAGNOSIS — E782 Mixed hyperlipidemia: Secondary | ICD-10-CM | POA: Diagnosis not present

## 2021-09-05 DIAGNOSIS — G629 Polyneuropathy, unspecified: Secondary | ICD-10-CM

## 2021-09-05 DIAGNOSIS — R059 Cough, unspecified: Secondary | ICD-10-CM | POA: Diagnosis not present

## 2021-09-05 DIAGNOSIS — I10 Essential (primary) hypertension: Secondary | ICD-10-CM

## 2021-09-05 DIAGNOSIS — R051 Acute cough: Secondary | ICD-10-CM | POA: Diagnosis not present

## 2021-09-05 DIAGNOSIS — E119 Type 2 diabetes mellitus without complications: Secondary | ICD-10-CM

## 2021-09-05 NOTE — Patient Instructions (Signed)
TAKE VITAMIN C DAILY UNTIL WELL.

## 2021-09-14 ENCOUNTER — Ambulatory Visit: Payer: Medicare Other | Attending: Urology

## 2021-09-14 DIAGNOSIS — R278 Other lack of coordination: Secondary | ICD-10-CM | POA: Insufficient documentation

## 2021-09-14 DIAGNOSIS — M6289 Other specified disorders of muscle: Secondary | ICD-10-CM | POA: Insufficient documentation

## 2021-09-14 DIAGNOSIS — M5459 Other low back pain: Secondary | ICD-10-CM | POA: Insufficient documentation

## 2021-09-14 DIAGNOSIS — M6281 Muscle weakness (generalized): Secondary | ICD-10-CM | POA: Insufficient documentation

## 2021-09-14 NOTE — Therapy (Signed)
OUTPATIENT PHYSICAL THERAPY FEMALE PELVIC TREATMENT   Patient Name: Nicole Young MRN: [id number] DOB:18-Nov-1940, 81 y.o.,female, female Today's Date: 09/14/2021   PT End of Session - 09/14/21 1017     Visit Number 2    Number of Visits 12    Date for PT Re-Evaluation 10/26/21    Authorization Type IE: 08/03/21    PT Start Time 1015    PT Stop Time 1055    PT Time Calculation (min) 40 min    Activity Tolerance Patient tolerated treatment well             Past Medical History:  Diagnosis Date   AMD (acid maltase deficiency) (HCC)    Complication of anesthesia    slow to wake up in past   Depression    controlled   Diabetes mellitus without complication ([place])    controlled   Hyperlipidemia    Hypothyroidism    Neuropathy    Osteoarthritis    knee, ankles, shoulders, back, neck, hands/fingers, wrists;    Rectocele    Thyroid disease    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[place])    Past Surgical History:  Procedure Laterality Date   AUGMENTATION MAMMAPLASTY  1975   AUGMENTATION MAMMAPLASTY  2017   2nd augmentation and lift   BREAST SURGERY     impants replaced with lift   CARPAL TUNNEL RELEASE Right 08/26/2014   Procedure: CARPAL TUNNEL RELEASE;  Surgeon: Earnestine Leys, MD;  Location: [facility] ORS;  Service: Orthopedics;  Laterality: Right;   COLONOSCOPY  2005   Dr Bary Castilla   COLONOSCOPY N/A 06/24/2014   Procedure: COLONOSCOPY;  Surgeon: Robert Bellow, MD;  Location: [facility] ENDOSCOPY;  Service: Endoscopy;  Laterality: N/A;   PARATHYROIDECTOMY  1992   PLACEMENT OF BREAST IMPLANTS  1975   TONSILECTOMY, ADENOIDECTOMY, BILATERAL MYRINGOTOMY AND TUBES     Patient Active Problem List   Diagnosis Date Noted   Premature ventricular contractions 08/09/2020   Heart palpitations 07/06/2020   Pure hypercholesterolemia 07/06/2020   Vaginal atrophy 08/16/2018   Rectocele 08/16/2018   Osteoarthritis of knee 10/10/2017   Scoliosis of lumbar spine 07/06/2016   Degeneration of lumbar  intervertebral disc 07/06/2016   Arthralgia of multiple joints 11/12/2014   Allergic rhinitis 11/12/2014   Diabetes mellitus ([place]) 11/12/2014   Essential (primary) hypertension 11/12/2014   Personal history of other drug therapy 11/12/2014   HLD (hyperlipidemia) 11/12/2014   Adult hypothyroidism 11/12/2014   Mild major depression ([place]) 11/12/2014   Overweight 11/12/2014   Restless leg 11/12/2014   Encounter for screening colonoscopy 04/17/2014   Type 2 diabetes mellitus ([place]) 12/23/2013    PCP: Jerrol Banana, MD  REFERRING PROVIDER: Bjorn Loser, MD   REFERRING DIAG:  N81.89 (ICD-10-CM) - Weakness of pelvic floor   THERAPY DIAG:  Other lack of coordination  Pelvic floor dysfunction  Other low back pain  Muscle weakness (generalized)  Rationale for Evaluation and Treatment: Rehabilitation  ONSET DATE: More than 2 years ago  PRECAUTIONS: None  WEIGHT BEARING RESTRICTIONS: No  FALLS:  Has patient fallen in last 6 months? No  OCCUPATION/SOCIAL ACTIVITIES: Retired, sitting/reading, walking (7,000-10,000 steps)   PLOF: Independent   CHIEF CONCERN: Pt had been noticing more nocturia about every 40 mins-hour through the night and had a burning sensation. Pt went into a walk-in clinic and tested negative for UTI. Pt was given estradiol cream and that helped with the burning sensation but is concerned for bladder cancer. Pt then looked for help at an urologist because she was still having urinary urgency and tried OAB given by Dr but felt it is helping just as much as the cream is. Pt feels bulging tissue from vaginal opening especially when she puts the estradiol cream on but has no pain with exposed tissue.   Pain type: sharp Pain description: intermittent, constant, and sharp    Aggravating factors: standing for longer periods of time, bending over Relieving factors: rest, sometimes Tylenol   LIVING ENVIRONMENT: Lives with: lives alone Lives in: House/apartment   PATIENT GOALS: Pt would like not to have the burning sensation at the vagina, work on the constipation, and work on the low back pain as well as make her prolapse better if she could    UROLOGICAL HISTORY Fluid intake: coffee, water, tea   Pain with urination: Yes Fully empty bladder: no Stream: dribbling, and weak  Urgency: No  Frequency: every 2-3 hours  Nocturia: 2x Leakage: No Pads: No Bladder control (0-10): 8/10  GASTROINTESTINAL HISTORY Pain with bowel movement: Yes, pain around anus has hx of hemorrhoids  Type of bowel movement:Type (Bristol Stool Scale) 2 and 3 and Strain Yes Toileting posture: heels raised  Frequency: every other day Fully empty rectum: Yes Leakage: Yes if have very loose stool, feels she passes gas then will leak, but not usually  Pads: Yes but only when it happens, they she will wear a pantyliner for a few days   SEXUAL HISTORY/FUNCTION Pt has no concerns   OBSTETRICAL HISTORY Vaginal deliveries: G4P3 Tearing: episiotomy, stitch   GYNECOLOGICAL HISTORY Hysterectomy: no Pelvic Organ Prolapse: Rectocele Grade II large Pain with exam: no  Heaviness/pressure: no   SUBJECTIVE:  Pt has been sick over the past few weeks but is doing better now. Pt has been trying toileting posture and has helped a lot since her last visit. Pt feels she is able to empty her bladder and not having a burning sensation nor any constipation.    PAIN:  Are you having pain? No NPRS scale: 0/10   TODAY'S TREATMENT  Neuromuscular Re-education:  Pre-treatment assessment:  OBJECTIVE:    COGNITION: Overall cognitive status: Within functional limits for tasks assessed     POSTURE:   Thoracic kyphosis: slight in standing Lumbar lordosis: slight decrease in standing   Iliac crest height: L iliac crest higher  Pelvic obliquity: L posterior    Adams test: + L rib hump    RANGE OF MOTION:   (Norm range in degrees)  LEFT 09/14/21 RIGHT 09/14/21  Lumbar forward flexion (65):  WNL    Lumbar extension (30): WNL    Lumbar lateral flexion (25):  WNL WNL  Thoracic and Lumbar rotation (30 degrees):    WNL WNL  Hip Flexion (0-125):   WNL WNL  Hip IR (0-45):  WNL WNL  Hip ER (0-45):  WNL WNL  Hip Adduction:      Hip Abduction (0-40):  WNL WNL  Hip extension (0-15):     (*= pain,  Blank rows = not tested)   STRENGTH: MMT    RLE 09/14/21 LLE 09/14/21  Hip Flexion 4 4  Hip Extension 4 4  Hip Abduction     Hip Adduction     Hip ER  5 5  Hip IR  5 5  Knee Extension 5 5  Knee Flexion 4 4  Dorsiflexion     Plantarflexion (seated) 5 5  (*= pain, Blank rows = not tested)   SPECIAL TESTS:  FABER (SN 81): negative B  FADIR (SN 94): negative B    PALPATION:  Abdominal:  Diastasis:  2 fingers above umbilicus, significant doming observed, 1 finger at and 1.5 fingers below umbilicus  Rib flare: none  B lower quadrants: increased discomfort upon palpation   EXTERNAL PELVIC EXAM: Patient educated on the purpose of the pelvic exam and articulated understanding; patient consented to the exam verbally. Deferred 2/2 time constraints Palpation: Breath coordination: present/absent/inconsistent Voluntary Contraction: present/absent Relaxation: full/delayed/non-relaxing Perineal movement with sustained IAP increase ("bear down"): descent/no change/elevation/excessive descent Perineal movement with rapid IAP increase ("cough"): elevation/no change/descent Pubic symphysis: (0= no contraction, 1= flicker, 2= weak squeeze, 3= fair squeeze with lift, 4= good squeeze and lift against resistance, 5= strong squeeze against strong resistance)   Manual Therapy: Abdominal myofascial release for improved fascial sling mobility and pain modulation in the L and R lower  quadrants    Neuromuscular Re-education: Supine hooklying diaphragmatic breathing with VCs and TCs for downregulation of the nervous system and improved management of IAP  Seated diaphragmatic breathing with VCs and TCs for downregulation of the nervous system and improved management of IAP  Discussion and demonstration on log roll technique for improved IAP management and to eliminate worsening of pelvic organ prolapse.    Patient response to interventions: Pt felt her abdomen might be "less tense" than before manual intervention   Patient Education:  Patient  provided with HEP including: seated and supine diaphragmatic breathing, practicing log rolling. Patient verbalized understanding and returned demonstration. Patient will benefit from further education in order to maximize compliance and understanding for long-term therapeutic gains.    ASSESSMENT:  Clinical Impression: Patient presents to clinic with excellent motivation to participate in today's session. Upon physical examination, Pt demonstrates deficits in IAP management, LE strength, posture, and pain as evidenced by increased L iliac crest height with L posterior innominate obliquity, + Adams Test (L rib hump), decreased lumbar lordosis in standing, DRA of 2 fingers above umbilicus, significant doming observed with observed Valsalva maneuver, 4/5 MMT in B hip flexion/ext/knee flexion, and discomfort upon superficial palpation of B lower quadrants. Will assess PFM externally next session due to time constraints. Time taken to discuss tension in the abdominals and management of DRA to decrease worsening. Pt verbalized understanding. After manual interventions, Pt with no discomfort at lower abdominals. Pt responded positively to manual, active, and educational interventions. Patient will benefit from skilled therapeutic intervention to address deficits in IAP management, PFM coordination, PFM extensibility, PFM strength, posture,  pain, LE strength, and scar mobility in order to increase PLOF and improve overall QOL.    Objective Impairments: decreased coordination, decreased endurance, decreased strength, increased muscle spasms, improper body mechanics, postural dysfunction, and pain.   Activity Limitations: lifting, bending, standing, continence, toileting, and locomotion level  Personal Factors: Age, Fitness, Past/current experiences, Time since onset of injury/illness/exacerbation, and 1-2 comorbidities: scoliosis of lumbar spine and hyperlipidemia  are also affecting patient's functional outcome.   Rehab Potential: Good  Clinical Decision Making:  Evolving/moderate complexity  Evaluation Complexity: Moderate   GOALS: Goals reviewed with patient? Yes  SHORT TERM GOALS: Target date: 09/14/21  Patient will demonstrate independence with HEP in order to maximize therapeutic gains and improve carryover from physical therapy sessions to ADLs in the home and community. Baseline: toileting posture and seated diaphragmatic breathing during toileting Goal status: IN PROGRESS   LONG TERM GOALS: Target date: 10/26/21  Patient will demonstrate circumferential and sequential contraction of >3/5 MMT, > 5 sec hold x5 and 5 consecutive quick flicks with </= 10 min rest between testing bouts, and relaxation of the PFM coordinated with breath for improved management of intra-abdominal pressure and normal bowel and bladder function without the presence of pain nor incontinence in order to improve participation at home and in the community. Baseline: will assess next visit Goal status: INITIAL  2.  Patient will demonstrate coordinated lengthening and relaxation of PFM with diaphragmatic inhalation in order to decrease spasm and allow for unrestricted elimination of urine/feces for improved overall QOL. Baseline: will assess next visit  Goal status: INITIAL  3.  Patient will decrease worst pain as reported on NPRS by at least  2 points to demonstrate clinically significant reduction in pain in order to restore/improve function and overall QOL. Baseline: LBP 9/10 sharp/stabbing  Goal status: INITIAL  4.  Patient will report less than 2 incidents of fecal incontinence over the course of 4 weeks after flatulation in order to demonstrate improved PFM coordination, PFM strength, and function for improved overall QOL. Baseline: up to 2x per month and will wear pantyliners for 2 days Goal status: INITIAL  5.  Patient will be able to articulate and demonstrate 3-5 postures that encourage gravity assisted repositioning of pelvic organs to decrease frequency and intensity of low back discomfort in order to participate more fully in activities at home and in the community.  Baseline: 9/10 LBP/Grade II rectocele  Goal status: INITIAL  6.  Patient will improve scores on FOTO Urinary Problem/Bowel Constipation/Bowel Leakage/Lumbar Spine by at least 10 points  in order to demonstrate improved IAP management, improved PFM coordination, improved strength and overall QOL.  Baseline: target scores in order (66, 59, 67, 61)  Goal status: INITIAL  PLAN: PT Frequency: 1x/week  PT Duration: 12 weeks  Planned Interventions: Therapeutic exercises, Therapeutic activity, Neuromuscular re-education, Balance training, Gait training, Patient/Family education, Self Care, Joint mobilization, Spinal mobilization, Cryotherapy, Moist heat, scar mobilization, Taping, and Manual therapy  Plan For Next Session: pelvic floor exam, thoracic rotation, begin deep core   Malerie Eakins, PT, DPT  09/14/2021, 10:18 AM

## 2021-09-20 ENCOUNTER — Ambulatory Visit: Payer: Medicare Other | Admitting: Physician Assistant

## 2021-09-27 ENCOUNTER — Other Ambulatory Visit: Payer: Self-pay | Admitting: Family Medicine

## 2021-09-27 DIAGNOSIS — E7849 Other hyperlipidemia: Secondary | ICD-10-CM

## 2021-09-29 ENCOUNTER — Ambulatory Visit: Payer: Medicare Other

## 2021-09-29 DIAGNOSIS — M5459 Other low back pain: Secondary | ICD-10-CM | POA: Diagnosis not present

## 2021-09-29 DIAGNOSIS — M6289 Other specified disorders of muscle: Secondary | ICD-10-CM | POA: Diagnosis not present

## 2021-09-29 DIAGNOSIS — F320 Major depressive disorder, single episode, mild: Secondary | ICD-10-CM | POA: Diagnosis not present

## 2021-09-29 DIAGNOSIS — G629 Polyneuropathy, unspecified: Secondary | ICD-10-CM | POA: Diagnosis not present

## 2021-09-29 DIAGNOSIS — M6281 Muscle weakness (generalized): Secondary | ICD-10-CM | POA: Diagnosis not present

## 2021-09-29 DIAGNOSIS — J301 Allergic rhinitis due to pollen: Secondary | ICD-10-CM | POA: Diagnosis not present

## 2021-09-29 DIAGNOSIS — E039 Hypothyroidism, unspecified: Secondary | ICD-10-CM | POA: Diagnosis not present

## 2021-09-29 DIAGNOSIS — I10 Essential (primary) hypertension: Secondary | ICD-10-CM | POA: Diagnosis not present

## 2021-09-29 DIAGNOSIS — Z79899 Other long term (current) drug therapy: Secondary | ICD-10-CM | POA: Diagnosis not present

## 2021-09-29 DIAGNOSIS — R278 Other lack of coordination: Secondary | ICD-10-CM

## 2021-09-29 DIAGNOSIS — E119 Type 2 diabetes mellitus without complications: Secondary | ICD-10-CM | POA: Diagnosis not present

## 2021-09-29 DIAGNOSIS — E782 Mixed hyperlipidemia: Secondary | ICD-10-CM | POA: Diagnosis not present

## 2021-09-29 NOTE — Therapy (Signed)
OUTPATIENT PHYSICAL THERAPY FEMALE PELVIC TREATMENT   Patient Name: Nicole Young MRN: [id number] DOB:[birth year], 81 y.o., female Today's Date: 09/29/2021   PT End of Session - 09/29/21 1404     Visit Number 3    Number of Visits 12    Date for PT Re-Evaluation 10/26/21    Authorization Type IE: 08/03/21    PT Start Time 1402    PT Stop Time 1440    PT Time Calculation (min) 38 min    Activity Tolerance Patient tolerated treatment well             Past Medical History:  Diagnosis Date   AMD (acid maltase deficiency) (HCC)    Complication of anesthesia    slow to wake up in past   Depression    controlled   Diabetes mellitus without complication ([place])    controlled   Hyperlipidemia    Hypothyroidism    Neuropathy    Osteoarthritis    knee, ankles, shoulders, back, neck, hands/fingers, wrists;    Rectocele    Thyroid disease    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[place])    Past Surgical History:  Procedure Laterality Date   AUGMENTATION MAMMAPLASTY  1975   AUGMENTATION MAMMAPLASTY  2017   2nd augmentation and lift   BREAST SURGERY     impants replaced with lift   CARPAL TUNNEL RELEASE Right 08/26/2014   Procedure: CARPAL TUNNEL RELEASE;  Surgeon: Earnestine Leys, MD;  Location: [facility] ORS;  Service: Orthopedics;  Laterality: Right;   COLONOSCOPY  2005   Dr Bary Castilla   COLONOSCOPY N/A 06/24/2014   Procedure: COLONOSCOPY;  Surgeon: Robert Bellow, MD;  Location: [facility] ENDOSCOPY;  Service: Endoscopy;  Laterality: N/A;   PARATHYROIDECTOMY  1992   PLACEMENT OF BREAST IMPLANTS  1975   TONSILECTOMY, ADENOIDECTOMY, BILATERAL MYRINGOTOMY AND TUBES     Patient Active Problem List   Diagnosis Date Noted   Premature ventricular contractions 08/09/2020   Heart palpitations 07/06/2020   Pure hypercholesterolemia 07/06/2020   Vaginal atrophy 08/16/2018   Rectocele 08/16/2018   Osteoarthritis of knee 10/10/2017   Scoliosis of lumbar spine 07/06/2016   Degeneration of lumbar  intervertebral disc 07/06/2016   Arthralgia of multiple joints 11/12/2014   Allergic rhinitis 11/12/2014   Diabetes mellitus ([place]) 11/12/2014   Essential (primary) hypertension 11/12/2014   Personal history of other drug therapy 11/12/2014   HLD (hyperlipidemia) 11/12/2014   Adult hypothyroidism 11/12/2014   Mild major depression ([place]) 11/12/2014   Overweight 11/12/2014   Restless leg 11/12/2014   Encounter for screening colonoscopy 04/17/2014   Type 2 diabetes mellitus ([place]) 12/23/2013    PCP: Jerrol Banana, MD  REFERRING PROVIDER: Bjorn Loser, MD   REFERRING DIAG:  N81.89 (ICD-10-CM) - Weakness of pelvic floor   THERAPY DIAG:  Other lack of coordination  Pelvic floor dysfunction  Other low back pain  Muscle weakness (generalized)  Rationale for Evaluation and Treatment: Rehabilitation  ONSET DATE: More than 2 years ago  PRECAUTIONS: None  WEIGHT BEARING RESTRICTIONS: No  FALLS:  Has patient fallen in last 6 months? No  OCCUPATION/SOCIAL ACTIVITIES: Retired, sitting/reading, walking (7,000-10,000 steps)   PLOF: Independent   CHIEF CONCERN: Pt had been noticing more nocturia about every 40 mins-hour through the night and had a burning sensation. Pt went into a walk-in clinic and tested negative for UTI. Pt was given estradiol cream and that helped with the burning sensation but is concerned for bladder cancer. Pt then looked for help at an urologist because she was still having urinary urgency and tried OAB given by Dr but felt it is helping just as much as the cream is. Pt feels bulging tissue from vaginal opening especially when she puts the estradiol cream on but has no pain with exposed tissue.   Pain type: sharp Pain description: intermittent, constant, and sharp    Aggravating factors: standing for longer periods of time, bending over Relieving factors: rest, sometimes Tylenol   LIVING ENVIRONMENT: Lives with: lives alone Lives in: House/apartment   PATIENT GOALS: Pt would like not to have the burning sensation at the vagina, work on the constipation, and work on the low back pain as well as make her prolapse better if she could    UROLOGICAL HISTORY Fluid intake: coffee, water, tea   Pain with urination: Yes Fully empty bladder: no Stream: dribbling, and weak  Urgency: No  Frequency: every 2-3 hours  Nocturia: 2x Leakage: No Pads: No Bladder control (0-10): 8/10  GASTROINTESTINAL HISTORY Pain with bowel movement: Yes, pain around anus has hx of hemorrhoids  Type of bowel movement:Type (Bristol Stool Scale) 2 and 3 and Strain Yes Toileting posture: heels raised  Frequency: every other day Fully empty rectum: Yes Leakage: Yes if have very loose stool, feels she passes gas then will leak, but not usually  Pads: Yes but only when it happens, they she will wear a pantyliner for a few days   SEXUAL HISTORY/FUNCTION Pt has no concerns   OBSTETRICAL HISTORY Vaginal deliveries: G4P3 Tearing: episiotomy, stitch   GYNECOLOGICAL HISTORY Hysterectomy: no Pelvic Organ Prolapse: Rectocele Grade II large Pain with exam: no  Heaviness/pressure: no   SUBJECTIVE:  Pt currently has a headache. Pt has been practicing HEP particularly getting in and out of bed.    PAIN:  Are you having pain? Yes NPRS scale: dull, 6/10    OBJECTIVE:    COGNITION: Overall cognitive status: Within functional limits for tasks assessed     POSTURE:   Thoracic kyphosis: slight in standing Lumbar lordosis: slight decrease in standing  Iliac crest height: L iliac crest higher  Pelvic obliquity: L posterior    Adams test: + L rib hump    RANGE OF MOTION:   (Norm range in degrees)  LEFT 09/14/21 RIGHT 09/14/21  Lumbar forward flexion (65):  WNL     Lumbar extension (30): WNL    Lumbar lateral flexion (25):  WNL WNL  Thoracic and Lumbar rotation (30 degrees):    WNL WNL  Hip Flexion (0-125):   WNL WNL  Hip IR (0-45):  WNL WNL  Hip ER (0-45):  WNL WNL  Hip Adduction:      Hip Abduction (0-40):  WNL WNL  Hip extension (0-15):     (*= pain, Blank rows = not tested)   STRENGTH: MMT    RLE 09/14/21 LLE 09/14/21  Hip Flexion 4 4  Hip Extension 4 4  Hip Abduction     Hip Adduction  Hip ER  5 5  Hip IR  5 5  Knee Extension 5 5  Knee Flexion 4 4  Dorsiflexion     Plantarflexion (seated) 5 5  (*= pain, Blank rows = not tested)   SPECIAL TESTS:  FABER (SN 81): negative B  FADIR (SN 94): negative B    PALPATION:  Abdominal:  Diastasis:  2 fingers above umbilicus, significant doming observed, 1 finger at and 1.5 fingers below umbilicus  Rib flare: none  B lower quadrants: increased discomfort upon palpation     TODAY'S TREATMENT  Neuromuscular Re-education: Pre-treatment assessment  EXTERNAL PELVIC EXAM: Patient educated on the purpose of the pelvic exam and articulated understanding; patient consented to the exam verbally. Breath coordination: present Voluntary Contraction: present, 4/5 MMT Relaxation: full Perineal movement with sustained IAP increase ("bear down"): excessive descent with Valsalva  Perineal movement with rapid IAP increase ("cough"): no change (0= no contraction, 1= flicker, 2= weak squeeze, 3= fair squeeze with lift, 4= good squeeze and lift against resistance, 5= strong squeeze against strong resistance)   Supine hooklying diaphragmatic breathing with VCs and TCs for downregulation of the nervous system and improved management of IAP  Right sidelying thoracic rotations, x10, for improved lengthening of the anterior fascial slings specifically on the R side (L sidelying)   Discussion on tension in the TMJ (clenching teeth) and how that relates to PFM. Pt verbalized understanding.     [facility]   Supine hooklying TrA contraction with coordinated exhale    Patient response to interventions: Pt will no HA at end of session   Patient Education:  Patient  provided with HEP including: supine TrA activation, thoracic rotation. Patient verbalized understanding and returned demonstration. Patient will benefit from further education in order to maximize compliance and understanding for long-term therapeutic gains.    ASSESSMENT:  Clinical Impression: Patient presents to clinic with excellent motivation to participate in today's session. Pt continues to demonstrate deficits in IAP management, LE strength, posture, and pain. Pt with HA upon arrival, 5/10, and described as dull. Upon PFM external exam, Pt demonstrates deficits in PFM coordination and PFM strength as evidenced by excessive descent with sustained IAP increase ("bear down") and observed Valsalva maneuver. Pt required moderate VCs and TCs to decrease bodily compensations (excessive exhale) and proper technique during TrA activation. Pt responded positively to active and educational interventions. Patient will benefit from skilled therapeutic intervention to address deficits in IAP management, PFM coordination, PFM extensibility, PFM strength, posture, pain, LE strength, and scar mobility in order to increase PLOF and improve overall QOL.    Objective Impairments: decreased coordination, decreased endurance, decreased strength, increased muscle spasms, improper body mechanics, postural dysfunction, and pain.   Activity Limitations: lifting, bending, standing, continence, toileting, and locomotion level  Personal Factors: Age, Fitness, Past/current experiences, Time since onset of injury/illness/exacerbation, and 1-2 comorbidities: scoliosis of lumbar spine and hyperlipidemia  are also affecting patient's functional outcome.   Rehab Potential: Good  Clinical Decision Making: Evolving/moderate  complexity  Evaluation Complexity: Moderate   GOALS: Goals reviewed with patient? Yes  SHORT TERM GOALS: Target date: 09/14/21  Patient will demonstrate independence with HEP in order to maximize therapeutic gains and improve carryover from physical therapy sessions to ADLs in the home and community. Baseline: toileting posture and seated diaphragmatic breathing during toileting Goal status: IN PROGRESS   LONG TERM GOALS: Target date: 10/26/21  Patient will demonstrate circumferential and sequential contraction of >3/5 MMT, > 5 sec hold x5  and 5 consecutive quick flicks with </= 10 min rest between testing bouts, and relaxation of the PFM coordinated with breath for improved management of intra-abdominal pressure and normal bowel and bladder function without the presence of pain nor incontinence in order to improve participation at home and in the community. Baseline: will assess next visit Goal status: INITIAL  2.  Patient will demonstrate coordinated lengthening and relaxation of PFM with diaphragmatic inhalation in order to decrease spasm and allow for unrestricted elimination of urine/feces for improved overall QOL. Baseline: will assess next visit  Goal status: INITIAL  3.  Patient will decrease worst pain as reported on NPRS by at least 2 points to demonstrate clinically significant reduction in pain in order to restore/improve function and overall QOL. Baseline: LBP 9/10 sharp/stabbing  Goal status: INITIAL  4.  Patient will report less than 2 incidents of fecal incontinence over the course of 4 weeks after flatulation in order to demonstrate improved PFM coordination, PFM strength, and function for improved overall QOL. Baseline: up to 2x per month and will wear pantyliners for 2 days Goal status: INITIAL  5.  Patient will be able to articulate and demonstrate 3-5 postures that encourage gravity assisted repositioning of pelvic organs to decrease frequency and intensity of low  back discomfort in order to participate more fully in activities at home and in the community.  Baseline: 9/10 LBP/Grade II rectocele  Goal status: INITIAL  6.  Patient will improve scores on FOTO Urinary Problem/Bowel Constipation/Bowel Leakage/Lumbar Spine by at least 10 points  in order to demonstrate improved IAP management, improved PFM coordination, improved strength and overall QOL.  Baseline: target scores in order (66, 59, 67, 61)  Goal status: INITIAL  PLAN: PT Frequency: 1x/week  PT Duration: 12 weeks  Planned Interventions: Therapeutic exercises, Therapeutic activity, Neuromuscular re-education, Balance training, Gait training, Patient/Family education, Self Care, Joint mobilization, Spinal mobilization, Cryotherapy, Moist heat, scar mobilization, Taping, and Manual therapy  Plan For Next Session: how is deep core? Continue deep core, positional variation for prolapse    Tanasha Menees, PT, DPT  09/29/2021, 2:04 PM

## 2021-09-30 LAB — COMPREHENSIVE METABOLIC PANEL
ALT: 19 IU/L (ref 0–32)
AST: 28 IU/L (ref 0–40)
Albumin/Globulin Ratio: 2.5 — ABNORMAL HIGH (ref 1.2–2.2)
Albumin: 4.9 g/dL — ABNORMAL HIGH (ref 3.7–4.7)
Alkaline Phosphatase: 45 IU/L (ref 44–121)
BUN/Creatinine Ratio: 23 (ref 12–28)
BUN: 16 mg/dL (ref 8–27)
Bilirubin Total: 0.4 mg/dL (ref 0.0–1.2)
CO2: 22 mmol/L (ref 20–29)
Calcium: 9.7 mg/dL (ref 8.7–10.3)
Chloride: 101 mmol/L (ref 96–106)
Creatinine, Ser: 0.69 mg/dL (ref 0.57–1.00)
Globulin, Total: 2 g/dL (ref 1.5–4.5)
Glucose: 125 mg/dL — ABNORMAL HIGH (ref 70–99)
Potassium: 4.3 mmol/L (ref 3.5–5.2)
Sodium: 140 mmol/L (ref 134–144)
Total Protein: 6.9 g/dL (ref 6.0–8.5)
eGFR: 87 mL/min/{1.73_m2} (ref >59)

## 2021-09-30 LAB — LIPID PANEL
Chol/HDL Ratio: 2.4 ratio (ref 0.0–4.4)
Cholesterol, Total: 145 mg/dL (ref 100–199)
HDL: 61 mg/dL (ref >39)
LDL Chol Calc (NIH): 64 mg/dL (ref 0–99)
Triglycerides: 111 mg/dL (ref 0–149)
VLDL Cholesterol Cal: 20 mg/dL (ref 5–40)

## 2021-09-30 LAB — CBC WITH DIFFERENTIAL/PLATELET
Basophils Absolute: 0.1 10*3/uL (ref 0.0–0.2)
Basos: 1 %
EOS (ABSOLUTE): 0.2 10*3/uL (ref 0.0–0.4)
Eos: 2 %
Hematocrit: 40.4 % (ref 34.0–46.6)
Hemoglobin: 13.4 g/dL (ref 11.1–15.9)
Immature Grans (Abs): 0 10*3/uL (ref 0.0–0.1)
Immature Granulocytes: 0 %
Lymphocytes Absolute: 2.8 10*3/uL (ref 0.7–3.1)
Lymphs: 31 %
MCH: 29.7 pg (ref 26.6–33.0)
MCHC: 33.2 g/dL (ref 31.5–35.7)
MCV: 90 fL (ref 79–97)
Monocytes Absolute: 0.7 10*3/uL (ref 0.1–0.9)
Monocytes: 7 %
Neutrophils Absolute: 5.3 10*3/uL (ref 1.4–7.0)
Neutrophils: 59 %
Platelets: 218 10*3/uL (ref 150–450)
RBC: 4.51 x10E6/uL (ref 3.77–5.28)
RDW: 13.6 % (ref 11.7–15.4)
WBC: 9.1 10*3/uL (ref 3.4–10.8)

## 2021-10-04 ENCOUNTER — Encounter: Payer: Self-pay | Admitting: Family Medicine

## 2021-10-04 ENCOUNTER — Ambulatory Visit (INDEPENDENT_AMBULATORY_CARE_PROVIDER_SITE_OTHER): Payer: Medicare Other | Admitting: Family Medicine

## 2021-10-04 VITALS — BP 120/60 | HR 83 | Resp 16 | Wt 142.0 lb

## 2021-10-04 DIAGNOSIS — K219 Gastro-esophageal reflux disease without esophagitis: Secondary | ICD-10-CM

## 2021-10-04 DIAGNOSIS — M24112 Other articular cartilage disorders, left shoulder: Secondary | ICD-10-CM | POA: Diagnosis not present

## 2021-10-04 DIAGNOSIS — E782 Mixed hyperlipidemia: Secondary | ICD-10-CM | POA: Diagnosis not present

## 2021-10-04 DIAGNOSIS — F320 Major depressive disorder, single episode, mild: Secondary | ICD-10-CM | POA: Diagnosis not present

## 2021-10-04 DIAGNOSIS — R1319 Other dysphagia: Secondary | ICD-10-CM

## 2021-10-04 DIAGNOSIS — I10 Essential (primary) hypertension: Secondary | ICD-10-CM | POA: Diagnosis not present

## 2021-10-04 DIAGNOSIS — M67814 Other specified disorders of tendon, left shoulder: Secondary | ICD-10-CM | POA: Diagnosis not present

## 2021-10-04 DIAGNOSIS — M75112 Incomplete rotator cuff tear or rupture of left shoulder, not specified as traumatic: Secondary | ICD-10-CM | POA: Diagnosis not present

## 2021-10-04 DIAGNOSIS — E039 Hypothyroidism, unspecified: Secondary | ICD-10-CM

## 2021-10-04 DIAGNOSIS — M7542 Impingement syndrome of left shoulder: Secondary | ICD-10-CM | POA: Diagnosis not present

## 2021-10-04 MED ORDER — PANTOPRAZOLE SODIUM 40 MG PO TBEC: 40.0000 mg | DELAYED_RELEASE_TABLET | Freq: Every day | ORAL | 1 refills | Status: DC

## 2021-10-04 NOTE — Progress Notes (Unsigned)
      Established patient visit  I,[name],acting as a scribe for [name], MD.,have documented all relevant documentation on the behalf of [name], MD,as directed by  [name], MD while in the presence of [name], MD.   Patient: Nicole Young   DOB: [birth year]   81 y.o. Female  MRN: [id number] Visit Date: 10/04/2021  Today's healthcare provider: [name], MD   Chief Complaint  Patient presents with   Follow-up   Subjective    HPI   Patient is here to discuss pantoprazole.   Medications: Outpatient Medications Prior to Visit  Medication Sig   Accu-Chek FastClix Lancets MISC CHECK SUGAR ONCE DAILY. DX: E11.9   ACCU-CHEK SMARTVIEW test strip USE TO CHECK BLOOD SUGAR ONCE A DAY   celecoxib (CELEBREX) 200 MG capsule Take 200 mg by mouth daily. Reports taking as needed   cholecalciferol (VITAMIN D3) 25 MCG (1000 UT) tablet Take 600 Units by mouth daily.   estradiol (ESTRACE) 0.1 MG/GM vaginal cream 1 gram intravaginally at bedtime daily for 2-3 weeks, then decrease to 3 times per week   guaiFENesin (MUCINEX PO) Take by mouth.   guaiFENesin-codeine (CHERATUSSIN AC) 100-10 MG/5ML syrup Take 5 mLs by mouth at bedtime as needed for cough (up to two doses daily).   hydroquinone 4 % cream Apply topically 2 (two) times daily.   levothyroxine (SYNTHROID) 50 MCG tablet TAKE ONE AND ONE-HALF TABLETS DAILY (Patient taking differently: Take 50 mcg by mouth daily before breakfast.)   Magnesium 250 MG TABS Take 1 tablet by mouth daily.   metFORMIN (GLUCOPHAGE-XR) 500 MG 24 hr tablet TAKE 1 TABLET TWICE A DAY (Patient taking differently: 500 mg 2 (two) times daily before a meal.)   methocarbamol (ROBAXIN) 500 MG tablet as needed.   mometasone (ELOCON) 0.1 % cream Apply 1 application topically See admin instructions. Use 1 - 2 times daily until clear, then use as a spot treatment when needed   Multiple Vitamin (MULTIVITAMIN) capsule Take 1  capsule by mouth daily.   Multiple Vitamins-Minerals (PRESERVISION AREDS PO) Take by mouth 2 (two) times daily.   rosuvastatin (CRESTOR) 20 MG tablet TAKE 1 TABLET DAILY   sertraline (ZOLOFT) 100 MG tablet TAKE 1 TABLET DAILY   No facility-administered medications prior to visit.    Review of Systems  {Labs  Heme  Chem  Endocrine  Serology  Results Review (optional):23779}   Objective    BP 120/60 (BP Location: Right Arm, Patient Position: Sitting, Cuff Size: Normal)   Pulse 83   Resp 16   Wt 142 lb (64.4 kg)   SpO2 95%   BMI 27.73 kg/m  {Show previous vital signs (optional):23777}  Physical Exam  ***  No results found for any visits on 10/04/21.  Assessment & Plan     ***  No follow-ups on file.      {provider attestation***:1}   [name], MD  [facility] [phone] (phone) [phone] (fax)  [place]

## 2021-10-06 ENCOUNTER — Ambulatory Visit: Payer: Medicare Other

## 2021-10-06 DIAGNOSIS — M6289 Other specified disorders of muscle: Secondary | ICD-10-CM

## 2021-10-06 DIAGNOSIS — M5459 Other low back pain: Secondary | ICD-10-CM

## 2021-10-06 DIAGNOSIS — M6281 Muscle weakness (generalized): Secondary | ICD-10-CM | POA: Diagnosis not present

## 2021-10-06 DIAGNOSIS — R278 Other lack of coordination: Secondary | ICD-10-CM

## 2021-10-06 NOTE — Therapy (Addendum)
OUTPATIENT PHYSICAL THERAPY FEMALE PELVIC TREATMENT   Patient Name: Nicole Young MRN: [id number] DOB:31-Jul-1940, 81 y.o., female Today's Date: 10/06/2021   PT End of Session - 10/06/21 1409     Visit Number 4    Number of Visits 12    Date for PT Re-Evaluation 10/26/21    Authorization Type IE: 08/03/21    PT Start Time 1404    PT Stop Time 1440    PT Time Calculation (min) 36 min    Activity Tolerance Patient tolerated treatment well             Past Medical History:  Diagnosis Date   AMD (acid maltase deficiency) (HCC)    Complication of anesthesia    slow to wake up in past   Depression    controlled   Diabetes mellitus without complication ([place])    controlled   Hyperlipidemia    Hypothyroidism    Neuropathy    Osteoarthritis    knee, ankles, shoulders, back, neck, hands/fingers, wrists;    Rectocele    Thyroid disease    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[place])    Past Surgical History:  Procedure Laterality Date   AUGMENTATION MAMMAPLASTY  1975   AUGMENTATION MAMMAPLASTY  2017   2nd augmentation and lift   BREAST SURGERY     impants replaced with lift   CARPAL TUNNEL RELEASE Right 08/26/2014   Procedure: CARPAL TUNNEL RELEASE;  Surgeon: Earnestine Leys, MD;  Location: [facility] ORS;  Service: Orthopedics;  Laterality: Right;   COLONOSCOPY  2005   Dr Bary Castilla   COLONOSCOPY N/A 06/24/2014   Procedure: COLONOSCOPY;  Surgeon: Robert Bellow, MD;  Location: [facility] ENDOSCOPY;  Service: Endoscopy;  Laterality: N/A;   PARATHYROIDECTOMY  1992   PLACEMENT OF BREAST IMPLANTS  1975   TONSILECTOMY, ADENOIDECTOMY, BILATERAL MYRINGOTOMY AND TUBES     Patient Active Problem List   Diagnosis Date Noted   Premature ventricular contractions 08/09/2020   Heart palpitations 07/06/2020   Pure hypercholesterolemia 07/06/2020   Vaginal atrophy 08/16/2018   Rectocele 08/16/2018   Osteoarthritis of knee 10/10/2017   Scoliosis of lumbar spine 07/06/2016   Degeneration of lumbar  intervertebral disc 07/06/2016   Arthralgia of multiple joints 11/12/2014   Allergic rhinitis 11/12/2014   Diabetes mellitus ([place]) 11/12/2014   Essential (primary) hypertension 11/12/2014   Personal history of other drug therapy 11/12/2014   HLD (hyperlipidemia) 11/12/2014   Adult hypothyroidism 11/12/2014   Mild major depression ([place]) 11/12/2014   Overweight 11/12/2014   Restless leg 11/12/2014   Encounter for screening colonoscopy 04/17/2014   Type 2 diabetes mellitus ([place]) 12/23/2013    PCP: Jerrol Banana, MD  REFERRING PROVIDER: Bjorn Loser, MD   REFERRING DIAG:  N81.89 (ICD-10-CM) - Weakness of pelvic floor   THERAPY DIAG:  No diagnosis found.  Rationale for Evaluation and Treatment: Rehabilitation  ONSET DATE: More than 2 years ago  PRECAUTIONS: None  WEIGHT BEARING RESTRICTIONS: No  FALLS:  Has patient fallen in last 6 months? No  OCCUPATION/SOCIAL ACTIVITIES: Retired, sitting/reading, walking (7,000-10,000 steps)   PLOF: Independent   CHIEF CONCERN: Pt had been noticing more nocturia about every 40 mins-hour through the night and had a burning sensation. Pt went into a walk-in clinic and tested negative for UTI. Pt was given estradiol cream and that helped with the burning sensation but is concerned for bladder cancer. Pt then looked for help at an urologist because she was still having urinary urgency and tried OAB given by Dr but felt it is helping just as much as the cream is. Pt feels bulging tissue from vaginal opening especially when she puts the estradiol cream on but has no pain with exposed tissue.   Pain type: sharp Pain description: intermittent, constant, and sharp   Aggravating factors: standing for longer periods of time, bending over Relieving factors:  rest, sometimes Tylenol   LIVING ENVIRONMENT: Lives with: lives alone Lives in: House/apartment   PATIENT GOALS: Pt would like not to have the burning sensation at the vagina, work on the constipation, and work on the low back pain as well as make her prolapse better if she could    UROLOGICAL HISTORY Fluid intake: coffee, water, tea   Pain with urination: Yes Fully empty bladder: no Stream: dribbling, and weak  Urgency: No  Frequency: every 2-3 hours  Nocturia: 2x Leakage: No Pads: No Bladder control (0-10): 8/10  GASTROINTESTINAL HISTORY Pain with bowel movement: Yes, pain around anus has hx of hemorrhoids  Type of bowel movement:Type (Bristol Stool Scale) 2 and 3 and Strain Yes Toileting posture: heels raised  Frequency: every other day Fully empty rectum: Yes Leakage: Yes if have very loose stool, feels she passes gas then will leak, but not usually  Pads: Yes but only when it happens, they she will wear a pantyliner for a few days   SEXUAL HISTORY/FUNCTION Pt has no concerns   OBSTETRICAL HISTORY Vaginal deliveries: G4P3 Tearing: episiotomy, stitch   GYNECOLOGICAL HISTORY Hysterectomy: no Pelvic Organ Prolapse: Rectocele Grade II large Pain with exam: no  Heaviness/pressure: no   SUBJECTIVE:  Pt arrived 4 mins after scheduled time. Pt has had a hectic few days and is having some L shoulder pain from recent MRI to look at shoulder.    PAIN:  Are you having pain? Yes NPRS scale: 4/10 in L shoulder     OBJECTIVE:    COGNITION: Overall cognitive status: Within functional limits for tasks assessed     POSTURE:   Thoracic kyphosis: slight in standing Lumbar lordosis: slight decrease in standing  Iliac crest height: L iliac crest higher  Pelvic obliquity: L posterior    Adams test: + L rib hump    RANGE OF MOTION:   (Norm range in degrees)  LEFT 09/14/21 RIGHT 09/14/21  Lumbar forward flexion (65):  WNL    Lumbar extension (30): WNL     Lumbar lateral flexion (25):  WNL WNL  Thoracic and Lumbar rotation (30 degrees):    WNL WNL  Hip Flexion (0-125):   WNL WNL  Hip IR (0-45):  WNL WNL  Hip ER (0-45):  WNL WNL  Hip Adduction:      Hip Abduction (0-40):  WNL WNL  Hip extension (0-15):     (*= pain, Blank rows = not tested)   STRENGTH: MMT    RLE 09/14/21 LLE 09/14/21  Hip Flexion 4 4  Hip  Extension 4 4  Hip Abduction     Hip Adduction     Hip ER  5 5  Hip IR  5 5  Knee Extension 5 5  Knee Flexion 4 4  Dorsiflexion     Plantarflexion (seated) 5 5  (*= pain, Blank rows = not tested)   SPECIAL TESTS:  FABER (SN 81): negative B  FADIR (SN 94): negative B    PALPATION:  Abdominal:  Diastasis:  2 fingers above umbilicus, significant doming observed, 1 finger at and 1.5 fingers below umbilicus  Rib flare: none  B lower quadrants: increased discomfort upon palpation    EXTERNAL PELVIC EXAM: Patient educated on the purpose of the pelvic exam and articulated understanding; patient consented to the exam verbally. Breath coordination: present Voluntary Contraction: present, 4/5 MMT Relaxation: full Perineal movement with sustained IAP increase ("bear down"): excessive descent with Valsalva  Perineal movement with rapid IAP increase ("cough"): no change (0= no contraction, 1= flicker, 2= weak squeeze, 3= fair squeeze with lift, 4= good squeeze and lift against resistance, 5= strong squeeze against strong resistance)    TODAY'S TREATMENT  Neuromuscular Re-education: Review: R sidelying thoracic rotations, x10, for improved lengthening of the anterior fascial slings, cueing for proper technique and to avoid increased shoulder movement   Supine hooklying diaphragmatic breathing with VCs and TCs for downregulation of the nervous system and improved management of IAP  [facility] abdominal rehab   Supine hooklying TrA contraction with coordinated exhale   Supine hooklying TrA w/marches   Seated diaphragmatic  breathing with VCs and TCs for downregulation of the nervous system and improved management of IAP  Seated TrA activation with coordinated exhale for improved IAP management, VCs and TCs required    Patient response to interventions: Pt found seated TrA activation a bit harder than supine    Patient Education:  Patient  provided with HEP including: supine TrA activation with marches, seated TrA activation. Patient educated throughout session on appropriate technique and form using multi-modal cueing, HEP, and activity modification. Patient will benefit from further education in order to maximize compliance and understanding for long-term therapeutic gains.    ASSESSMENT:  Clinical Impression: Patient presents to clinic with excellent motivation to participate in today's session. Pt continues to demonstrate deficits in IAP management, LE strength, posture, and pain. Pt with increased L shoulder pain today due to MRI earlier in the week. Will be meeting with orthopedic doctor soon to discuss results and keep DPT updated. Moist heat helped the Pt, x10 mins during active interventions with shoulder pain ending at a 3/10. Pt required moderate VCs and TCs for proper technique and to decrease forceful contraction (gentle TrA activation). Pt responded positively to active and educational interventions. Patient will benefit from skilled therapeutic intervention to address deficits in IAP management, PFM coordination, PFM extensibility, PFM strength, posture, pain, LE strength, and scar mobility in order to increase PLOF and improve overall QOL.    Objective Impairments: decreased coordination, decreased endurance, decreased strength, increased muscle spasms, improper body mechanics, postural dysfunction, and pain.   Activity Limitations: lifting, bending, standing, continence, toileting, and locomotion level  Personal Factors: Age, Fitness, Past/current experiences, Time since onset of  injury/illness/exacerbation, and 1-2 comorbidities: scoliosis of lumbar spine and hyperlipidemia  are also affecting patient's functional outcome.   Rehab Potential: Good  Clinical Decision Making: Evolving/moderate complexity  Evaluation Complexity: Moderate   GOALS: Goals reviewed with patient? Yes  SHORT TERM GOALS: Target date: 09/14/21  Patient will demonstrate  independence with HEP in order to maximize therapeutic gains and improve carryover from physical therapy sessions to ADLs in the home and community. Baseline: toileting posture and seated diaphragmatic breathing during toileting Goal status: IN PROGRESS   LONG TERM GOALS: Target date: 10/26/21  Patient will demonstrate circumferential and sequential contraction of >3/5 MMT, > 5 sec hold x5 and 5 consecutive quick flicks with </= 10 min rest between testing bouts, and relaxation of the PFM coordinated with breath for improved management of intra-abdominal pressure and normal bowel and bladder function without the presence of pain nor incontinence in order to improve participation at home and in the community. Baseline: will assess next visit Goal status: INITIAL  2.  Patient will demonstrate coordinated lengthening and relaxation of PFM with diaphragmatic inhalation in order to decrease spasm and allow for unrestricted elimination of urine/feces for improved overall QOL. Baseline: will assess next visit  Goal status: INITIAL  3.  Patient will decrease worst pain as reported on NPRS by at least 2 points to demonstrate clinically significant reduction in pain in order to restore/improve function and overall QOL. Baseline: LBP 9/10 sharp/stabbing  Goal status: INITIAL  4.  Patient will report less than 2 incidents of fecal incontinence over the course of 4 weeks after flatulation in order to demonstrate improved PFM coordination, PFM strength, and function for improved overall QOL. Baseline: up to 2x per month and will wear  pantyliners for 2 days Goal status: INITIAL  5.  Patient will be able to articulate and demonstrate 3-5 postures that encourage gravity assisted repositioning of pelvic organs to decrease frequency and intensity of low back discomfort in order to participate more fully in activities at home and in the community.  Baseline: 9/10 LBP/Grade II rectocele  Goal status: INITIAL  6.  Patient will improve scores on FOTO Urinary Problem/Bowel Constipation/Bowel Leakage/Lumbar Spine by at least 10 points  in order to demonstrate improved IAP management, improved PFM coordination, improved strength and overall QOL.  Baseline: target scores in order (66, 59, 67, 61)  Goal status: INITIAL  PLAN: PT Frequency: 1x/week  PT Duration: 12 weeks  Planned Interventions: Therapeutic exercises, Therapeutic activity, Neuromuscular re-education, Balance training, Gait training, Patient/Family education, Self Care, Joint mobilization, Spinal mobilization, Cryotherapy, Moist heat, scar mobilization, Taping, and Manual therapy  Plan For Next Session: how was trip?? Continue deep core, positional variation for prolapse    Kelsa Jaworowski, PT, DPT  10/06/2021, 2:10 PM

## 2021-10-07 DIAGNOSIS — M7542 Impingement syndrome of left shoulder: Secondary | ICD-10-CM | POA: Diagnosis not present

## 2021-10-17 ENCOUNTER — Ambulatory Visit: Payer: Medicare Other | Admitting: Urology

## 2021-10-24 DIAGNOSIS — H353221 Exudative age-related macular degeneration, left eye, with active choroidal neovascularization: Secondary | ICD-10-CM | POA: Diagnosis not present

## 2021-10-25 ENCOUNTER — Ambulatory Visit
Admission: EM | Admit: 2021-10-25 | Discharge: 2021-10-25 | Disposition: A | Discharge status: Home / Self Care | Payer: Medicare Other | Attending: Emergency Medicine | Admitting: Emergency Medicine

## 2021-10-25 DIAGNOSIS — J0100 Acute maxillary sinusitis, unspecified: Secondary | ICD-10-CM | POA: Diagnosis not present

## 2021-10-25 DIAGNOSIS — J209 Acute bronchitis, unspecified: Secondary | ICD-10-CM

## 2021-10-25 MED ORDER — BENZONATATE 100 MG PO CAPS: 100.0000 mg | ORAL_CAPSULE | Freq: Three times a day (TID) | ORAL | 0 refills | Status: DC | PRN

## 2021-10-25 MED ORDER — AMOXICILLIN 875 MG PO TABS
875.0000 mg | ORAL_TABLET | Freq: Two times a day (BID) | ORAL | 0 refills | Status: AC
Start: 2021-10-25 — End: 2021-11-04

## 2021-10-25 NOTE — Discharge Instructions (Addendum)
Take the amoxicillin and Tessalon Perles as directed.    Follow up with your primary care provider if your symptoms are not improving.    

## 2021-10-25 NOTE — ED Provider Notes (Signed)
[name]    CSN: [id number] Arrival date & time: 10/25/21  1427      History   Chief Complaint Chief Complaint  Patient presents with   Cough   Nasal Congestion    HPI Nicole Young is a 81 y.o. female.  Patient presents with 1 week history of congestion, postnasal drip, runny nose, cough.  She had similar symptoms in August; negative chest x-ray on 09/05/2021 and was treated symptomatically.  Her symptoms returned after a recent trip to [place].  No fever, rash, shortness of breath, vomiting, diarrhea, or other symptoms.  Her medical history includes hypertension and diabetes.   The history is provided by the patient and medical records.    Past Medical History:  Diagnosis Date   AMD (acid maltase deficiency) (HCC)    Complication of anesthesia    slow to wake up in past   Depression    controlled   Diabetes mellitus without complication ([place])    controlled   Hyperlipidemia    Hypothyroidism    Neuropathy    Osteoarthritis    knee, ankles, shoulders, back, neck, hands/fingers, wrists;    Rectocele    Thyroid disease    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[facility])     Patient Active Problem List   Diagnosis Date Noted   Premature ventricular contractions 08/09/2020   Heart palpitations 07/06/2020   Pure hypercholesterolemia 07/06/2020   Vaginal atrophy 08/16/2018   Rectocele 08/16/2018   Osteoarthritis of knee 10/10/2017   Scoliosis of lumbar spine 07/06/2016   Degeneration of lumbar intervertebral disc 07/06/2016   Arthralgia of multiple joints 11/12/2014   Allergic rhinitis 11/12/2014   Diabetes mellitus ([place]) 11/12/2014   Essential (primary) hypertension 11/12/2014   Personal history of other drug therapy 11/12/2014   HLD (hyperlipidemia) 11/12/2014   Adult hypothyroidism 11/12/2014   Mild major depression ([place]) 11/12/2014   Overweight 11/12/2014   Restless leg 11/12/2014   Encounter for screening colonoscopy 04/17/2014   Type 2 diabetes  mellitus ([place]) 12/23/2013    Past Surgical History:  Procedure Laterality Date   AUGMENTATION MAMMAPLASTY  1975   AUGMENTATION MAMMAPLASTY  2017   2nd augmentation and lift   BREAST SURGERY     impants replaced with lift   CARPAL TUNNEL RELEASE Right 08/26/2014   Procedure: CARPAL TUNNEL RELEASE;  Surgeon: Earnestine Leys, MD;  Location: [facility] ORS;  Service: Orthopedics;  Laterality: Right;   COLONOSCOPY  2005   Dr Bary Castilla   COLONOSCOPY N/A 06/24/2014   Procedure: COLONOSCOPY;  Surgeon: Robert Bellow, MD;  Location: [facility] ENDOSCOPY;  Service: Endoscopy;  Laterality: N/A;   PARATHYROIDECTOMY  1992   PLACEMENT OF BREAST IMPLANTS  1975   TONSILECTOMY, ADENOIDECTOMY, BILATERAL MYRINGOTOMY AND TUBES      OB History     Gravida  4   Para  3   Term      Preterm      AB  1   Living         SAB  1   IAB      Ectopic      Multiple      Live Births           Obstetric Comments  1st Menstrual Cycle:  12  1st Pregnancy: 22          Home Medications    Prior to Admission medications   Medication Sig Start Date End Date Taking? Authorizing Provider  amoxicillin (AMOXIL) 875 MG tablet Take  1 tablet (875 mg total) by mouth 2 (two) times daily for 10 days. 10/25/21 11/04/21 Yes Sharion Balloon, NP  benzonatate (TESSALON) 100 MG capsule Take 1 capsule (100 mg total) by mouth 3 (three) times daily as needed for cough. 10/25/21  Yes Sharion Balloon, NP  Accu-Chek FastClix Lancets MISC CHECK SUGAR ONCE DAILY. DX: E11.9 09/20/18   Jerrol Banana., MD  ACCU-CHEK SMARTVIEW test strip USE TO CHECK BLOOD SUGAR ONCE A DAY 11/26/20   Jerrol Banana., MD  celecoxib (CELEBREX) 200 MG capsule Take 200 mg by mouth daily. Reports taking as needed    [provider]  cholecalciferol (VITAMIN D3) 25 MCG (1000 UT) tablet Take 600 Units by mouth daily.    [provider]  estradiol (ESTRACE) 0.1 MG/GM vaginal cream 1 gram intravaginally at bedtime daily for 2-3  weeks, then decrease to 3 times per week 03/31/21   Carvel Getting, NP  guaiFENesin (MUCINEX PO) Take by mouth.    [provider]  guaiFENesin-codeine (CHERATUSSIN AC) 100-10 MG/5ML syrup Take 5 mLs by mouth at bedtime as needed for cough (up to two doses daily). 02/22/21   Mikey Kirschner, PA-C  hydroquinone 4 % cream Apply topically 2 (two) times daily. 12/08/20   Brendolyn Patty, MD  levothyroxine (SYNTHROID) 50 MCG tablet TAKE ONE AND ONE-HALF TABLETS DAILY Patient taking differently: Take 50 mcg by mouth daily before breakfast. 11/23/20   Jerrol Banana., MD  Magnesium 250 MG TABS Take 1 tablet by mouth daily.    [provider]  metFORMIN (GLUCOPHAGE-XR) 500 MG 24 hr tablet TAKE 1 TABLET TWICE A DAY Patient taking differently: 500 mg 2 (two) times daily before a meal. 05/26/21   Jerrol Banana., MD  methocarbamol (ROBAXIN) 500 MG tablet as needed.    [provider]  mometasone (ELOCON) 0.1 % cream Apply 1 application topically See admin instructions. Use 1 - 2 times daily until clear, then use as a spot treatment when needed 02/15/21   Brendolyn Patty, MD  Multiple Vitamin (MULTIVITAMIN) capsule Take 1 capsule by mouth daily.    [provider]  Multiple Vitamins-Minerals (PRESERVISION AREDS PO) Take by mouth 2 (two) times daily.    [provider]  pantoprazole (PROTONIX) 40 MG tablet Take 1 tablet (40 mg total) by mouth daily. 10/04/21   Jerrol Banana., MD  rosuvastatin (CRESTOR) 20 MG tablet TAKE 1 TABLET DAILY 03/30/21   Jerrol Banana., MD  sertraline (ZOLOFT) 100 MG tablet TAKE 1 TABLET DAILY 04/04/21   Jerrol Banana., MD    Family History Family History  Problem Relation Age of Onset   Cancer Father        prostate   Prostate cancer Father    Hypertension Mother    Heart attack Mother    Heart attack Maternal Grandfather    Diabetes Paternal Uncle    Congestive Heart Failure Maternal Grandmother    CVA  Paternal Grandmother    Prostate cancer Paternal Grandfather    Breast cancer Maternal Aunt     Social History Social History   Tobacco Use   Smoking status: Never   Smokeless tobacco: Never  Vaping Use   Vaping Use: Never used  Substance Use Topics   Alcohol use: Yes    Alcohol/week: 1.0 standard drink of alcohol    Types: 1 Glasses of wine per week   Drug use: No  Allergies   Neomycin-polymyxin-gramicidin, Farxiga [dapagliflozin], Neosporin [neomycin-bacitracin zn-polymyx], Sulfa antibiotics, Thimerosal (thiomersal), and Zostavax  [zoster vaccine live]   Review of Systems Review of Systems  Constitutional:  Negative for chills and fever.  HENT:  Positive for congestion, postnasal drip and rhinorrhea. Negative for ear pain and sore throat.   Respiratory:  Positive for cough. Negative for shortness of breath.   Cardiovascular:  Negative for chest pain and palpitations.  Gastrointestinal:  Negative for diarrhea and vomiting.  Skin:  Negative for color change and rash.  All other systems reviewed and are negative.    Physical Exam Triage Vital Signs ED Triage Vitals  Enc Vitals Group     BP      Pulse      Resp      Temp      Temp src      SpO2      Weight      Height      Head Circumference      Peak Flow      Pain Score      Pain Loc      Pain Edu?      Excl. in [place]?    No data found.  Updated Vital Signs BP (!) 147/75   Pulse 82   Temp 97.9 F (36.6 C)   Resp 18   Ht '5\' 1"'$  (1.549 m)   Wt 140 lb (63.5 kg)   SpO2 95%   BMI 26.45 kg/m   Visual Acuity Right Eye Distance:   Left Eye Distance:   Bilateral Distance:    Right Eye Near:   Left Eye Near:    Bilateral Near:     Physical Exam Vitals and nursing note reviewed.  Constitutional:      General: She is not in acute distress.    Appearance: She is well-developed. She is not ill-appearing.  HENT:     Right Ear: Tympanic membrane normal.     Left Ear: Tympanic membrane normal.      Nose: Congestion and rhinorrhea present.     Mouth/Throat:     Mouth: Mucous membranes are moist.     Pharynx: Oropharynx is clear.  Cardiovascular:     Rate and Rhythm: Normal rate and regular rhythm.     Heart sounds: Normal heart sounds.  Pulmonary:     Effort: Pulmonary effort is normal. No respiratory distress.     Breath sounds: Normal breath sounds.  Musculoskeletal:     Cervical back: Neck supple.  Skin:    General: Skin is warm and dry.  Neurological:     Mental Status: She is alert.  Psychiatric:        Mood and Affect: Mood normal.        Behavior: Behavior normal.      UC Treatments / Results  Labs (all labs ordered are listed, but only abnormal results are displayed) Labs Reviewed - No data to display  EKG   Radiology No results found.  Procedures Procedures (including critical care time)  Medications Ordered in UC Medications - No data to display  Initial Impression / Assessment and Plan / UC Course  I have reviewed the triage vital signs and the nursing notes.  Pertinent labs & imaging results that were available during my care of the patient were reviewed by me and considered in my medical decision making (see chart for details).    Acute sinusitis and bronchitis.  Patient declines COVID test; negative test at  home.  Treating with amoxicillin and Tessalon Perles.  Instructed patient to follow up with her PCP if her symptoms are not improving.  She agrees to plan of care.    Final Clinical Impressions(s) / UC Diagnoses   Final diagnoses:  Acute non-recurrent maxillary sinusitis  Acute bronchitis, unspecified organism     Discharge Instructions      Take the amoxicillin and Tessalon Perles as directed.  Follow up with your primary care provider if your symptoms are not improving.        ED Prescriptions     Medication Sig Dispense Auth. Provider   benzonatate (TESSALON) 100 MG capsule Take 1 capsule (100 mg total) by mouth 3 (three)  times daily as needed for cough. 21 capsule Sharion Balloon, NP   amoxicillin (AMOXIL) 875 MG tablet Take 1 tablet (875 mg total) by mouth 2 (two) times daily for 10 days. 20 tablet Sharion Balloon, NP      PDMP not reviewed this encounter.   Sharion Balloon, NP 10/25/21 [phone]

## 2021-10-25 NOTE — ED Triage Notes (Signed)
Patient to Urgent Care with complaints of cough and nasal congestion x1 week. Cough is occasionally productive with white mucus. Denies any known fevers.   Reports same symptoms over a month ago that lasted for three weeks. Thought she was doing better but woke up today feeling worse.   Reports that she did a covid test that was negative.

## 2021-10-27 ENCOUNTER — Ambulatory Visit: Payer: Medicare Other

## 2021-10-31 ENCOUNTER — Ambulatory Visit (INDEPENDENT_AMBULATORY_CARE_PROVIDER_SITE_OTHER): Payer: Medicare Other | Admitting: Urology

## 2021-10-31 VITALS — BP 150/73 | HR 77 | Ht 60.0 in | Wt 143.0 lb

## 2021-10-31 DIAGNOSIS — R350 Frequency of micturition: Secondary | ICD-10-CM

## 2021-10-31 LAB — URINALYSIS, COMPLETE
Bilirubin, UA: NEGATIVE
Glucose, UA: NEGATIVE
Ketones, UA: NEGATIVE
Leukocytes,UA: NEGATIVE
Nitrite, UA: NEGATIVE
Protein,UA: NEGATIVE
RBC, UA: NEGATIVE
Specific Gravity, UA: 1.015 (ref 1.005–1.030)
Urobilinogen, Ur: 0.2 mg/dL (ref 0.2–1.0)
pH, UA: 6.5 (ref 5.0–7.5)

## 2021-10-31 LAB — MICROSCOPIC EXAMINATION

## 2021-10-31 MED ORDER — ESTRADIOL 0.1 MG/GM VA CREA: TOPICAL_CREAM | VAGINAL | 12 refills | Status: DC

## 2021-10-31 NOTE — Progress Notes (Signed)
10/31/2021 9:58 AM   Nicole Young February 18, 1940 [id number]  Referring provider: Jerrol Banana., MD No address on file  Chief Complaint  Patient presents with   Urinary Frequency    HPI: as consulted to assess the patient's feeling of urgency and fullness.  Many weeks ago she was having a lot of burning and the need to get up 5-6 times a night.  She describes a negative culture and was put on estrogen cream.  Burning almost completely gone and now gets up twice  She feels this fullness like she needs to urinate.  She is continent unless she holds it a long time.  She has never smoked.  Flow varies.  She generally feels empty and double voids a small amount.  Sometimes she has a need to go and then only voids a small amount   Has not had a hysterectomy.  2 friends of had bladder cancer concerns     Patient is only getting up twice instead of 5 times.  Urgency much better.  Burning is gone.  She uses estradiol  and is also on the Gemtesa samples.  She wonders if the estradiol is helping a lot.   Well supported bladder neck.  No cystocele or stress incontinence.  Large grade 2 rectocele   Cystoscopy normal  think it is reasonable to stop the Gemtesa.  See if she does well on estradiol.  Reassess in 3 months.  Can always go back on overactive bladder medication  Today Frequency stable.  She only gets up once a night now.  Physical therapy helped a lot with some suggestions about toileting.  Doing well on estrogen.  No infections.   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[place])      Surgical History: Past Surgical History:  Procedure Laterality Date   AUGMENTATION MAMMAPLASTY  1975   AUGMENTATION MAMMAPLASTY  2017   2nd augmentation and lift   BREAST SURGERY     impants replaced with lift   CARPAL TUNNEL RELEASE Right 08/26/2014   Procedure: CARPAL TUNNEL RELEASE;  Surgeon: Earnestine Leys, MD;  Location: [facility] ORS;  Service: Orthopedics;  Laterality: Right;   COLONOSCOPY  2005   Dr Bary Castilla   COLONOSCOPY N/A 06/24/2014   Procedure: COLONOSCOPY;  Surgeon: Robert Bellow, MD;  [facility];  Service: Endoscopy;  Laterality: N/A;   PARATHYROIDECTOMY  1992   PLACEMENT OF BREAST IMPLANTS  1975   TONSILECTOMY, ADENOIDECTOMY, BILATERAL MYRINGOTOMY AND TUBES      Home Medications:  Allergies as of 10/31/2021       Reactions   Neomycin-polymyxin-gramicidin Other (See Comments)   Other Reaction: Other reaction   Farxiga [dapagliflozin] Other (See Comments)   Weak and dizzy    Neosporin [neomycin-bacitracin Zn-polymyx] Swelling   Sulfa Antibiotics    Thimerosal (thiomersal) Swelling   Zostavax  [zoster Vaccine Live] Rash   Do Not Administer Zostavax to Pt!        Medication List  Accurate as of October 31, 2021  9:58 AM. If you have any questions, ask your nurse or doctor.          Accu-Chek FastClix Lancets Misc CHECK SUGAR ONCE DAILY. DX: E11.9   Accu-Chek SmartView test strip Generic drug: glucose blood USE TO CHECK BLOOD SUGAR ONCE A DAY   amoxicillin 875 MG tablet Commonly known as: AMOXIL Take 1 tablet (875 mg total) by mouth 2 (two) times daily for 10 days.   benzonatate 100 MG capsule Commonly known as: TESSALON Take 1 capsule (100 mg total) by mouth 3 (three) times daily as needed for cough.   celecoxib 200 MG capsule Commonly known as: CELEBREX Take 200 mg by mouth daily. Reports taking as needed   Cheratussin AC 100-10 MG/5ML syrup Generic drug: guaiFENesin-codeine Take 5 mLs by mouth at bedtime as needed  for cough (up to two doses daily).   cholecalciferol 25 MCG (1000 UNIT) tablet Commonly known as: VITAMIN D3 Take 600 Units by mouth daily.   estradiol 0.1 MG/GM vaginal cream Commonly known as: ESTRACE 1 gram intravaginally at bedtime daily for 2-3 weeks, then decrease to 3 times per week   hydroquinone 4 % cream Apply topically 2 (two) times daily.   levothyroxine 50 MCG tablet Commonly known as: SYNTHROID TAKE ONE AND ONE-HALF TABLETS DAILY What changed:  how much to take when to take this   Magnesium 250 MG Tabs Take 1 tablet by mouth daily.   metFORMIN 500 MG 24 hr tablet Commonly known as: GLUCOPHAGE-XR TAKE 1 TABLET TWICE A DAY What changed:  how to take this when to take this   methocarbamol 500 MG tablet Commonly known as: ROBAXIN as needed.   mometasone 0.1 % cream Commonly known as: ELOCON Apply 1 application topically See admin instructions. Use 1 - 2 times daily until clear, then use as a spot treatment when needed   MUCINEX PO Take by mouth.   multivitamin capsule Take 1 capsule by mouth daily.   pantoprazole 40 MG tablet Commonly known as: PROTONIX Take 1 tablet (40 mg total) by mouth daily.   PRESERVISION AREDS PO Take by mouth 2 (two) times daily.   rosuvastatin 20 MG tablet Commonly known as: CRESTOR TAKE 1 TABLET DAILY   sertraline 100 MG tablet Commonly known as: ZOLOFT TAKE 1 TABLET DAILY        Allergies:  Allergies  Allergen Reactions   Neomycin-Polymyxin-Gramicidin Other (See Comments)    Other Reaction: Other reaction   Farxiga [Dapagliflozin] Other (See Comments)    Weak and dizzy    Neosporin [Neomycin-Bacitracin Zn-Polymyx] Swelling   Sulfa Antibiotics    Thimerosal (Thiomersal) Swelling   Zostavax  [Zoster Vaccine Live] Rash    Do Not Administer Zostavax to Pt!    Family History: Family History  Problem Relation Age of Onset   Cancer Father        prostate   Prostate cancer Father    Hypertension Mother     Heart attack Mother    Heart attack Maternal Grandfather    Diabetes Paternal Uncle    Congestive Heart Failure Maternal Grandmother    CVA Paternal Grandmother    Prostate cancer Paternal Grandfather    Breast cancer Maternal Aunt     Social History:  reports that she has never smoked. She has never used smokeless tobacco. She reports current alcohol use of about 1.0 standard drink of alcohol per week. She reports that she does not use drugs.  ROS:  Physical Exam: There were no vitals taken for this visit.  Constitutional:  Alert and oriented, No acute distress. HEENT: [place] AT, moist mucus membranes.  Trachea midline, no masses.   Laboratory Data: Lab Results  Component Value Date   WBC 9.1 09/29/2021   HGB 13.4 09/29/2021   HCT 40.4 09/29/2021   MCV 90 09/29/2021   PLT 218 09/29/2021    Lab Results  Component Value Date   CREATININE 0.69 09/29/2021    No results found for: "PSA"  No results found for: "TESTOSTERONE"  Lab Results  Component Value Date   HGBA1C 6.1 (A) 09/06/2020    Urinalysis    Component Value Date/Time   APPEARANCEUR Clear 07/18/2021 0924   GLUCOSEU Negative 07/18/2021 0924   BILIRUBINUR Negative 07/18/2021 0924   KETONESUR negative 03/31/2021 1220   PROTEINUR Negative 07/18/2021 0924   UROBILINOGEN 0.2 03/31/2021 1220   NITRITE Negative 07/18/2021 0924   LEUKOCYTESUR Negative 07/18/2021 0924    Pertinent Imaging:   Assessment & Plan: Estrogen renewed and I will see in 1 year  There are no diagnoses linked to this encounter.  No follow-ups on file.  Reece Packer, MD  [facility] [address] [place], [place] [address] [phone]

## 2021-11-01 ENCOUNTER — Ambulatory Visit: Payer: Medicare Other | Attending: Urology

## 2021-11-01 DIAGNOSIS — M6289 Other specified disorders of muscle: Secondary | ICD-10-CM | POA: Insufficient documentation

## 2021-11-01 DIAGNOSIS — M5459 Other low back pain: Secondary | ICD-10-CM | POA: Insufficient documentation

## 2021-11-01 DIAGNOSIS — R278 Other lack of coordination: Secondary | ICD-10-CM | POA: Diagnosis not present

## 2021-11-01 DIAGNOSIS — M6281 Muscle weakness (generalized): Secondary | ICD-10-CM | POA: Insufficient documentation

## 2021-11-01 NOTE — Therapy (Signed)
OUTPATIENT PHYSICAL THERAPY FEMALE PELVIC RE-CERT   Patient Name: Nicole Young MRN: [id number] DOB:[birth year], 81 y.o., female Today's Date: 11/01/2021   PT End of Session - 11/01/21 1452     Visit Number 5    Number of Visits 12    Date for PT Re-Evaluation 11/22/21    Authorization Type IE: 08/03/21; RC: 11/01/21    PT Start Time 1450    PT Stop Time 1530    PT Time Calculation (min) 40 min    Activity Tolerance Patient tolerated treatment well             Past Medical History:  Diagnosis Date   AMD (acid maltase deficiency) ([place])    Complication of anesthesia    slow to wake up in past   Depression    controlled   Diabetes mellitus without complication ([place])    controlled   Hyperlipidemia    Hypothyroidism    Neuropathy    Osteoarthritis    knee, ankles, shoulders, back, neck, hands/fingers, wrists;    Rectocele    Thyroid disease    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[place])    Past Surgical History:  Procedure Laterality Date   AUGMENTATION MAMMAPLASTY  1975   AUGMENTATION MAMMAPLASTY  2017   2nd augmentation and lift   BREAST SURGERY     impants replaced with lift   CARPAL TUNNEL RELEASE Right 08/26/2014   Procedure: CARPAL TUNNEL RELEASE;  Surgeon: Earnestine Leys, MD;  Location: [facility] ORS;  Service: Orthopedics;  Laterality: Right;   COLONOSCOPY  2005   Dr Bary Castilla   COLONOSCOPY N/A 06/24/2014   Procedure: COLONOSCOPY;  Surgeon: Robert Bellow, MD;  Location: [facility] ENDOSCOPY;  Service: Endoscopy;  Laterality: N/A;   PARATHYROIDECTOMY  1992   PLACEMENT OF BREAST IMPLANTS  1975   TONSILECTOMY, ADENOIDECTOMY, BILATERAL MYRINGOTOMY AND TUBES     Patient Active Problem List   Diagnosis Date Noted   Premature ventricular contractions 08/09/2020   Heart palpitations 07/06/2020   Pure hypercholesterolemia 07/06/2020   Vaginal atrophy 08/16/2018   Rectocele 08/16/2018   Osteoarthritis of knee 10/10/2017   Scoliosis of lumbar spine 07/06/2016    Degeneration of lumbar intervertebral disc 07/06/2016   Arthralgia of multiple joints 11/12/2014   Allergic rhinitis 11/12/2014   Diabetes mellitus ([place]) 11/12/2014   Essential (primary) hypertension 11/12/2014   Personal history of other drug therapy 11/12/2014   HLD (hyperlipidemia) 11/12/2014   Adult hypothyroidism 11/12/2014   Mild major depression ([place]) 11/12/2014   Overweight 11/12/2014   Restless leg 11/12/2014   Encounter for screening colonoscopy 04/17/2014   Type 2 diabetes mellitus ([place]) 12/23/2013    PCP: Jerrol Banana, MD  REFERRING PROVIDER: Bjorn Loser, MD   REFERRING DIAG:  N81.89 (ICD-10-CM) - Weakness of pelvic floor   THERAPY DIAG:  Other lack of coordination  Pelvic floor dysfunction  Other low back pain  Muscle weakness (generalized)  Rationale for Evaluation and Treatment: Rehabilitation  ONSET DATE: More than 2 years ago  PRECAUTIONS: None  WEIGHT BEARING RESTRICTIONS: No  FALLS:  Has patient fallen in last 6 months? No  OCCUPATION/SOCIAL ACTIVITIES: Retired, sitting/reading, walking (7,000-10,000 steps)   PLOF: Independent   CHIEF CONCERN: Pt had been noticing more nocturia about every 40 mins-hour through the night and had a burning sensation. Pt went into a walk-in clinic and tested negative for UTI. Pt was given estradiol cream and that helped with the burning sensation but is concerned for bladder cancer. Pt then looked for help at an urologist because she was still having urinary urgency and tried OAB given by Dr but felt it is helping just as much as the cream is. Pt feels bulging tissue from vaginal opening especially when she puts the estradiol cream on but has no pain with exposed tissue.   Pain type: sharp Pain description: intermittent,  constant, and sharp   Aggravating factors: standing for longer periods of time, bending over Relieving factors: rest, sometimes Tylenol   LIVING ENVIRONMENT: Lives with: lives alone Lives in: House/apartment   PATIENT GOALS: Pt would like not to have the burning sensation at the vagina, work on the constipation, and work on the low back pain as well as make her prolapse better if she could    UROLOGICAL HISTORY Fluid intake: coffee, water, tea   Pain with urination: Yes Fully empty bladder: no Stream: dribbling, and weak  Urgency: No  Frequency: every 2-3 hours  Nocturia: 2x Leakage: No Pads: No Bladder control (0-10): 8/10  GASTROINTESTINAL HISTORY Pain with bowel movement: Yes, pain around anus has hx of hemorrhoids  Type of bowel movement:Type (Bristol Stool Scale) 2 and 3 and Strain Yes Toileting posture: heels raised  Frequency: every other day Fully empty rectum: Yes Leakage: Yes if have very loose stool, feels she passes gas then will leak, but not usually  Pads: Yes but only when it happens, they she will wear a pantyliner for a few days   SEXUAL HISTORY/FUNCTION Pt has no concerns   OBSTETRICAL HISTORY Vaginal deliveries: G4P3 Tearing: episiotomy, stitch   GYNECOLOGICAL HISTORY Hysterectomy: no Pelvic Organ Prolapse: Rectocele Grade II large Pain with exam: no  Heaviness/pressure: no   SUBJECTIVE:  Pt has been recovering from being ill. Pt has returned from a trip from [place]. Pt has been doing well in terms of pelvic floor compared to IE.    PAIN:  Are you having pain? No NPRS scale:    OBJECTIVE:    COGNITION: Overall cognitive status: Within functional limits for tasks assessed     POSTURE:   Thoracic kyphosis: slight in standing Lumbar lordosis: slight decrease in standing  Iliac crest height: L iliac crest higher  Pelvic obliquity: L posterior    Adams test: + L rib hump    RANGE OF MOTION:   (Norm range in degrees)   LEFT 09/14/21 RIGHT 09/14/21  Lumbar forward flexion (65):  WNL    Lumbar extension (30): WNL    Lumbar lateral flexion (25):  WNL WNL  Thoracic and Lumbar rotation (30 degrees):    WNL WNL  Hip Flexion (0-125):   WNL WNL  Hip IR (0-45):  WNL WNL  Hip ER (0-45):  WNL WNL  Hip Adduction:      Hip Abduction (0-40):  WNL WNL  Hip extension (0-15):     (*= pain, Blank rows = not tested)   STRENGTH: MMT    RLE 09/14/21 LLE 09/14/21  Hip Flexion 4 4  Hip Extension 4 4  Hip  Abduction     Hip Adduction     Hip ER  5 5  Hip IR  5 5  Knee Extension 5 5  Knee Flexion 4 4  Dorsiflexion     Plantarflexion (seated) 5 5  (*= pain, Blank rows = not tested)   SPECIAL TESTS:  FABER (SN 81): negative B  FADIR (SN 94): negative B    PALPATION:  Abdominal:  Diastasis:  2 fingers above umbilicus, significant doming observed, 1 finger at and 1.5 fingers below umbilicus  Rib flare: none  B lower quadrants: increased discomfort upon palpation    EXTERNAL PELVIC EXAM: Patient educated on the purpose of the pelvic exam and articulated understanding; patient consented to the exam verbally. Breath coordination: present Voluntary Contraction: present, 4/5 MMT Relaxation: full Perineal movement with sustained IAP increase ("bear down"): excessive descent with Valsalva  Perineal movement with rapid IAP increase ("cough"): no change (0= no contraction, 1= flicker, 2= weak squeeze, 3= fair squeeze with lift, 4= good squeeze and lift against resistance, 5= strong squeeze against strong resistance)    TODAY'S TREATMENT  Neuromuscular Re-education: Reassessment of FOTO:  Lumbar Spine - 75 (IE:58) Urinary Problem - 69 (IE: 56)  Bowel Constipation - 49 (IE: 49)  Bowel Leakage - 49 (IE: 48)  Has met 3/7 goals  Discussion on LTGs and review of STGs and LTGs below. Discussion on consistency with PFPT treatment sessions and continuing to practice HEP at home in order to continue seeing progression. Pt  verbalized understanding.   Discussion on what to focus on in the last session as Pt will now be receiving physical therapy for the L shoulder and will stop PFPT at this time.   Patient response to interventions: Pt will return for one more session to then start addressing the L shoulder in PT   Patient Education:  Patient  provided with HEP including: supine TrA activation with marches, seated TrA activation. Patient educated throughout session on appropriate technique and form using multi-modal cueing, HEP, and activity modification. Patient will benefit from further education in order to maximize compliance and understanding for long-term therapeutic gains.    ASSESSMENT:  Clinical Impression: Patient presents to clinic with excellent motivation to participate in today's reassessment. Pt has made significant progress from IE on 08/03/21 in relation to pain, PFM coordination, posture, and PFM extensibility. Pt has met 3/7 goals below. Based on FOTO, Pt increased Lumbar Spine score by more than 10 points (75) compared to IE (58) and per Pt is not limited at all with daily activities or recreation. Pt has also demonstrated improvement in FOTO Urinary Problem (69) compared to IE (56) with decreased nocturia and urinary frequency during the day. Although, Pt has made improvement, Pt continues to demonstrate deficits in PFM coordination, PFM strength, IAP management, posture, and LE strength. Pt has been on a course of antibiotics for upper respiratory infection and has been having more bowel leakage although that is not reported happening on average. DPT mentioned that this deficit is due to the mismanagement of abdominal pressures and we will continue to target the deep core. Due to requiring physical therapy for the L shoulder, Pt will return for one more PFPT session then switch to receiving PT for the shoulder. DPT is in agreement and advised Pt to continue with PFPT afterwards to continue working  on impairments. Pt verbalized understanding. Patient will continue to benefit from skilled therapeutic intervention to address deficits in IAP management, PFM coordination, PFM strength, posture,  and LE strength in order to increase PLOF and improve overall QOL.    Objective Impairments: decreased coordination, decreased endurance, decreased strength, increased muscle spasms, improper body mechanics, postural dysfunction, and pain.   Activity Limitations: lifting, bending, standing, continence, toileting, and locomotion level  Personal Factors: Age, Fitness, Past/current experiences, Time since onset of injury/illness/exacerbation, and 1-2 comorbidities: scoliosis of lumbar spine and hyperlipidemia  are also affecting patient's functional outcome.   Rehab Potential: Good  Clinical Decision Making: Evolving/moderate complexity  Evaluation Complexity: Moderate   GOALS: Goals reviewed with patient? Yes  SHORT TERM GOALS: Target date: 09/14/21  Patient will demonstrate independence with HEP in order to maximize therapeutic gains and improve carryover from physical therapy sessions to ADLs in the home and community. Baseline: toileting posture and seated diaphragmatic breathing during toileting; (10/24): Pt is IND with HEP given  Goal status: MET   LONG TERM GOALS: Target date: 11/22/21  Patient will demonstrate circumferential and sequential contraction of >3/5 MMT, > 5 sec hold x5 and 5 consecutive quick flicks with </= 10 min rest between testing bouts, and relaxation of the PFM coordinated with breath for improved management of intra-abdominal pressure and normal bowel and bladder function without the presence of pain nor incontinence in order to improve participation at home and in the community. Baseline: 4/5 MMT/no endurance tested  Goal status: INITIAL  2.  Patient will demonstrate coordinated lengthening and relaxation of PFM with diaphragmatic inhalation in order to decrease spasm  and allow for unrestricted elimination of urine/feces for improved overall QOL. Baseline: (10/24): Pt has to strain occasionally but not compared to IE Goal status: IN PROGRESS   3.  Patient will decrease worst pain as reported on NPRS by at least 2 points to demonstrate clinically significant reduction in pain in order to restore/improve function and overall QOL. Baseline: LBP 9/10 sharp/stabbing; (10/24): 7/10  Goal status: MET  4.  Patient will report less than 2 incidents of fecal incontinence over the course of 4 weeks after flatulation in order to demonstrate improved PFM coordination, PFM strength, and function for improved overall QOL. Baseline: up to 2x per month and will wear pantyliners for 2 days; (10/24): Pt did not have bowel leakage over the course of 4 weeks Goal status: MET  5.  Patient will be able to articulate and demonstrate 3-5 postures that encourage gravity assisted repositioning of pelvic organs to decrease frequency and intensity of low back discomfort in order to participate more fully in activities at home and in the community.  Baseline: 9/10 LBP/Grade II rectocele; (10/24): will address next session Goal status: IN PROGRESS  6.  Patient will improve scores on FOTO Urinary Problem/Bowel Constipation/Bowel Leakage/Lumbar Spine by at least 10 points  in order to demonstrate improved IAP management, improved PFM coordination, improved strength and overall QOL.  Baseline: target scores in order (66, 59, 67, 61), (10/24): 69, 49, 59, 75 (MET goal for Urinary Problem and Lumbar Spine)  Goal status: IN PROGRESS  PLAN: PT Frequency: 1x/week  PT Duration: 12 weeks  Planned Interventions: Therapeutic exercises, Therapeutic activity, Neuromuscular re-education, Balance training, Gait training, Patient/Family education, Self Care, Joint mobilization, Spinal mobilization, Cryotherapy, Moist heat, scar mobilization, Taping, and Manual therapy  Plan For Next Session:   postural variation of prolapse, deep core sitting and standing at wall squats/pallof      Marisel Tostenson, PT, DPT  11/01/2021, 5:24 PM

## 2021-11-08 ENCOUNTER — Ambulatory Visit: Payer: Medicare Other

## 2021-11-08 DIAGNOSIS — M6281 Muscle weakness (generalized): Secondary | ICD-10-CM | POA: Diagnosis not present

## 2021-11-08 DIAGNOSIS — M6289 Other specified disorders of muscle: Secondary | ICD-10-CM | POA: Diagnosis not present

## 2021-11-08 DIAGNOSIS — R278 Other lack of coordination: Secondary | ICD-10-CM | POA: Diagnosis not present

## 2021-11-08 DIAGNOSIS — M5459 Other low back pain: Secondary | ICD-10-CM | POA: Diagnosis not present

## 2021-11-08 NOTE — Therapy (Signed)
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[place])    Complication of anesthesia    slow to wake up in past   Depression    controlled   Diabetes mellitus without complication ([place])    controlled   Hyperlipidemia    Hypothyroidism    Neuropathy    Osteoarthritis    knee, ankles, shoulders, back, neck, hands/fingers, wrists;    Rectocele    Thyroid disease    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[place])    Past Surgical History:  Procedure Laterality Date   AUGMENTATION MAMMAPLASTY  1975   AUGMENTATION MAMMAPLASTY  2017   2nd augmentation and lift   BREAST SURGERY     impants replaced with lift   CARPAL TUNNEL RELEASE Right 08/26/2014   Procedure: CARPAL TUNNEL RELEASE;  Surgeon: Earnestine Leys, MD;  Location: [facility] ORS;  Service: Orthopedics;  Laterality: Right;   COLONOSCOPY  2005   Dr Bary Castilla   COLONOSCOPY N/A 06/24/2014   Procedure: COLONOSCOPY;  Surgeon: Robert Bellow, MD;  Location: [facility] ENDOSCOPY;  Service: Endoscopy;  Laterality: N/A;   PARATHYROIDECTOMY  1992   PLACEMENT OF BREAST IMPLANTS  1975   TONSILECTOMY, ADENOIDECTOMY, BILATERAL MYRINGOTOMY AND TUBES     Patient Active Problem List   Diagnosis Date Noted   Premature ventricular contractions 08/09/2020   Heart palpitations 07/06/2020   Pure hypercholesterolemia 07/06/2020   Vaginal atrophy 08/16/2018   Rectocele 08/16/2018   Osteoarthritis of knee 10/10/2017   Scoliosis of lumbar spine 07/06/2016    Degeneration of lumbar intervertebral disc 07/06/2016   Arthralgia of multiple joints 11/12/2014   Allergic rhinitis 11/12/2014   Diabetes mellitus ([place]) 11/12/2014   Essential (primary) hypertension 11/12/2014   Personal history of other drug therapy 11/12/2014   HLD (hyperlipidemia) 11/12/2014   Adult hypothyroidism 11/12/2014   Mild major depression ([place]) 11/12/2014   Overweight 11/12/2014   Restless leg 11/12/2014   Encounter for screening colonoscopy 04/17/2014   Type 2 diabetes mellitus ([place]) 12/23/2013    PCP: Jerrol Banana, MD  REFERRING PROVIDER: Bjorn Loser, MD   REFERRING DIAG:  N81.89 (ICD-10-CM) - Weakness of pelvic floor   THERAPY DIAG:  Pelvic floor dysfunction  Other lack of coordination  Other low back pain  Muscle weakness (generalized)  Rationale for Evaluation and Treatment: Rehabilitation  ONSET DATE: More than 2 years ago  PRECAUTIONS: None  WEIGHT BEARING RESTRICTIONS: No  FALLS:  Has patient fallen in last 6 months? No  OCCUPATION/SOCIAL ACTIVITIES: Retired, sitting/reading, walking (7,000-10,000 steps)   PLOF: Independent   CHIEF CONCERN: Pt had been noticing more nocturia about every 40 mins-hour through the night and had a burning sensation. Pt went into a walk-in clinic and tested negative for UTI. Pt was given estradiol cream and that helped with the burning sensation but is concerned for bladder cancer. Pt then looked for help at an urologist because she was still having urinary urgency and tried OAB given by Dr but felt it is helping just as much as the cream is. Pt feels bulging tissue from vaginal opening especially when she puts the estradiol cream on but has no pain with exposed tissue.   Pain type: sharp Pain description: intermittent,  constant, and sharp   Aggravating factors: standing for longer periods of time, bending over Relieving factors: rest, sometimes Tylenol   LIVING ENVIRONMENT: Lives with: lives alone Lives in: House/apartment   PATIENT GOALS: Pt would like not to have the burning sensation at the vagina, work on the constipation, and work on the low back pain as well as make her prolapse better if she could    UROLOGICAL HISTORY Fluid intake: coffee, water, tea   Pain with urination: Yes Fully empty bladder: no Stream: dribbling, and weak  Urgency: No  Frequency: every 2-3 hours  Nocturia: 2x Leakage: No Pads: No Bladder control (0-10): 8/10  GASTROINTESTINAL HISTORY Pain with bowel movement: Yes, pain around anus has hx of hemorrhoids  Type of bowel movement:Type (Bristol Stool Scale) 2 and 3 and Strain Yes Toileting posture: heels raised  Frequency: every other day Fully empty rectum: Yes Leakage: Yes if have very loose stool, feels she passes gas then will leak, but not usually  Pads: Yes but only when it happens, they she will wear a pantyliner for a few days   SEXUAL HISTORY/FUNCTION Pt has no concerns   OBSTETRICAL HISTORY Vaginal deliveries: G4P3 Tearing: episiotomy, stitch   GYNECOLOGICAL HISTORY Hysterectomy: no Pelvic Organ Prolapse: Rectocele Grade II large Pain with exam: no  Heaviness/pressure: no   SUBJECTIVE:  Pt arrived a few mins later after scheduled appt. Pt has been doing well. Pt is going to discharge today and continue with PT for her shoulder.    PAIN:  Are you having pain? No NPRS scale:    OBJECTIVE:    COGNITION: Overall cognitive status: Within functional limits for tasks assessed     POSTURE:   Thoracic kyphosis: slight in standing Lumbar lordosis: slight decrease in standing  Iliac crest height: L iliac crest higher  Pelvic obliquity: L posterior    Adams test: + L rib hump    RANGE OF MOTION:   (Norm range in degrees)   LEFT 09/14/21 RIGHT 09/14/21  Lumbar forward flexion (65):  WNL    Lumbar extension (30): WNL    Lumbar lateral flexion (25):  WNL WNL  Thoracic and Lumbar rotation (30 degrees):    WNL WNL  Hip Flexion (0-125):   WNL WNL  Hip IR (0-45):  WNL WNL  Hip ER (0-45):  WNL WNL  Hip Adduction:      Hip Abduction (0-40):  WNL WNL  Hip extension (0-15):     (*= pain, Blank rows = not tested)   STRENGTH: MMT    RLE 09/14/21 LLE 09/14/21  Hip Flexion 4 4  Hip Extension 4 4  Hip Abduction  Hip Adduction     Hip ER  5 5  Hip IR  5 5  Knee Extension 5 5  Knee Flexion 4 4  Dorsiflexion     Plantarflexion (seated) 5 5  (*= pain, Blank rows = not tested)   SPECIAL TESTS:  FABER (SN 81): negative B  FADIR (SN 94): negative B    PALPATION:  Abdominal:  Diastasis:  2 fingers above umbilicus, significant doming observed, 1 finger at and 1.5 fingers below umbilicus  Rib flare: none  B lower quadrants: increased discomfort upon palpation    EXTERNAL PELVIC EXAM: Patient educated on the purpose of the pelvic exam and articulated understanding; patient consented to the exam verbally. Breath coordination: present Voluntary Contraction: present, 4/5 MMT Relaxation: full Perineal movement with sustained IAP increase ("bear down"): excessive descent with Valsalva  Perineal movement with rapid IAP increase ("cough"): no change (0= no contraction, 1= flicker, 2= weak squeeze, 3= fair squeeze with lift, 4= good squeeze and lift against resistance, 5= strong squeeze against strong resistance)    TODAY'S TREATMENT  Neuromuscular Re-education: Discussion on positional variation of prolapse and Pt practiced for improved IAP management and to decrease pelvic/vaginal pressure. Pt given educational handout.  Use of supine diaphragmatic breathing during different positioning techniques   Standing wall TrA activation with coordinated breath for postural stability and improved IAP management, VCs and  TCs required  Wall squats with coordinated breath postural stability and improved IAP management, VCs and TCs required    Patient response to interventions: Pt comfortable to discharge    Patient Education:  Patient  provided with HEP including: standing TrA activation and wall squats, positional variation handout for prolapse. Patient educated throughout session on appropriate technique and form using multi-modal cueing, HEP, and activity modification. Patient will benefit from further education in order to maximize compliance and understanding for long-term therapeutic gains.    ASSESSMENT:  Clinical Impression: Patient presents to clinic with excellent motivation to participate in today's discharge. Pt continues to demonstrate deficits in PFM coordination, PFM strength, IAP management, posture, and LE strength; however, due to increased pain in the L shoulder, Pt will be discontinuing PFPT for now and beginning PT for the L shoulder. Discussion on continuing to work on TrA activation in various positions and diaphragmatic breathing. Pt required moderate VCs and TCs for gravity-assisted positional variation for prolapse to aid in decreasing pressure felt at the rectum. Pt also cued on how to progress TrA activation in standing via multiple techniques. Pt demonstrated and verbalized understanding. DPT advised Pt to continue with PFPT after she has improved strengthening, motion, and decreased pain in the shoulder. Pt adequate for discharge.    Objective Impairments: decreased coordination, decreased endurance, decreased strength, increased muscle spasms, improper body mechanics, postural dysfunction, and pain.   Activity Limitations: lifting, bending, standing, continence, toileting, and locomotion level  Personal Factors: Age, Fitness, Past/current experiences, Time since onset of injury/illness/exacerbation, and 1-2 comorbidities: scoliosis of lumbar spine and hyperlipidemia  are also  affecting patient's functional outcome.   Rehab Potential: Good  Clinical Decision Making: Evolving/moderate complexity  Evaluation Complexity: Moderate   GOALS: Goals reviewed with patient? Yes  SHORT TERM GOALS: Target date: 09/14/21  Patient will demonstrate independence with HEP in order to maximize therapeutic gains and improve carryover from physical therapy sessions to ADLs in the home and community. Baseline: toileting posture and seated diaphragmatic breathing during toileting; (10/24): Pt is IND with HEP given  Goal status: MET   LONG  TERM GOALS: Target date: 11/22/21  Patient will demonstrate circumferential and sequential contraction of >3/5 MMT, > 5 sec hold x5 and 5 consecutive quick flicks with </= 10 min rest between testing bouts, and relaxation of the PFM coordinated with breath for improved management of intra-abdominal pressure and normal bowel and bladder function without the presence of pain nor incontinence in order to improve participation at home and in the community. Baseline: 4/5 MMT/no endurance tested  Goal status: INITIAL  2.  Patient will demonstrate coordinated lengthening and relaxation of PFM with diaphragmatic inhalation in order to decrease spasm and allow for unrestricted elimination of urine/feces for improved overall QOL. Baseline: (10/24): Pt has to strain occasionally but not compared to IE Goal status: IN PROGRESS   3.  Patient will decrease worst pain as reported on NPRS by at least 2 points to demonstrate clinically significant reduction in pain in order to restore/improve function and overall QOL. Baseline: LBP 9/10 sharp/stabbing; (10/24): 7/10  Goal status: MET  4.  Patient will report less than 2 incidents of fecal incontinence over the course of 4 weeks after flatulation in order to demonstrate improved PFM coordination, PFM strength, and function for improved overall QOL. Baseline: up to 2x per month and will wear pantyliners for 2  days; (10/24): Pt did not have bowel leakage over the course of 4 weeks Goal status: MET  5.  Patient will be able to articulate and demonstrate 3-5 postures that encourage gravity assisted repositioning of pelvic organs to decrease frequency and intensity of low back discomfort in order to participate more fully in activities at home and in the community.  Baseline: 9/10 LBP/Grade II rectocele; (10/24): will address next session Goal status: IN PROGRESS  6.  Patient will improve scores on FOTO Urinary Problem/Bowel Constipation/Bowel Leakage/Lumbar Spine by at least 10 points  in order to demonstrate improved IAP management, improved PFM coordination, improved strength and overall QOL.  Baseline: target scores in order (66, 59, 67, 61), (10/24): 69, 49, 59, 75 (MET goal for Urinary Problem and Lumbar Spine)  Goal status: IN PROGRESS  PLAN: PT Frequency: 1x/week  PT Duration: 12 weeks  Planned Interventions: Therapeutic exercises, Therapeutic activity, Neuromuscular re-education, Balance training, Gait training, Patient/Family education, Self Care, Joint mobilization, Spinal mobilization, Cryotherapy, Moist heat, scar mobilization, Taping, and Manual therapy     Nicole Young, PT, DPT  11/08/2021, 2:51 PM

## 2021-11-11 ENCOUNTER — Encounter: Payer: Self-pay | Admitting: Family Medicine

## 2021-11-11 ENCOUNTER — Other Ambulatory Visit: Payer: Self-pay

## 2021-11-11 DIAGNOSIS — Z1231 Encounter for screening mammogram for malignant neoplasm of breast: Secondary | ICD-10-CM

## 2021-11-14 NOTE — Therapy (Signed)
OUTPATIENT PHYSICAL THERAPY SHOULDER EVALUATION   Patient Name: Nicole Young MRN: [id number] DOB:Jan 17, 1940, 81 y.o., female Today's Date: 11/14/2021    Past Medical History:  Diagnosis Date   AMD (acid maltase deficiency) ([facility])    Complication of anesthesia    slow to wake up in past   Depression    controlled   Diabetes mellitus without complication ([place])    controlled   Hyperlipidemia    Hypothyroidism    Neuropathy    Osteoarthritis    knee, ankles, shoulders, back, neck, hands/fingers, wrists;    Rectocele    Thyroid disease    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[place])    Past Surgical History:  Procedure Laterality Date   AUGMENTATION MAMMAPLASTY  1975   AUGMENTATION MAMMAPLASTY  2017   2nd augmentation and lift   BREAST SURGERY     impants replaced with lift   CARPAL TUNNEL RELEASE Right 08/26/2014   Procedure: CARPAL TUNNEL RELEASE;  Surgeon: Earnestine Leys, MD;  Location: [facility] ORS;  Service: Orthopedics;  Laterality: Right;   COLONOSCOPY  2005   Dr Bary Castilla   COLONOSCOPY N/A 06/24/2014   Procedure: COLONOSCOPY;  Surgeon: Robert Bellow, MD;  Location: [facility] ENDOSCOPY;  Service: Endoscopy;  Laterality: N/A;   PARATHYROIDECTOMY  1992   PLACEMENT OF BREAST IMPLANTS  1975   TONSILECTOMY, ADENOIDECTOMY, BILATERAL MYRINGOTOMY AND TUBES     Patient Active Problem List   Diagnosis Date Noted   Premature ventricular contractions 08/09/2020   Heart palpitations 07/06/2020   Pure hypercholesterolemia 07/06/2020   Vaginal atrophy 08/16/2018   Rectocele 08/16/2018   Osteoarthritis of knee 10/10/2017   Scoliosis of lumbar spine 07/06/2016   Degeneration of lumbar intervertebral disc 07/06/2016   Arthralgia of multiple joints 11/12/2014   Allergic rhinitis 11/12/2014   Diabetes mellitus ([place]) 11/12/2014   Essential (primary) hypertension 11/12/2014   Personal history of other drug therapy 11/12/2014   HLD (hyperlipidemia) 11/12/2014   Adult hypothyroidism  11/12/2014   Mild major depression ([place]) 11/12/2014   Overweight 11/12/2014   Restless leg 11/12/2014   Encounter for screening colonoscopy 04/17/2014   Type 2 diabetes mellitus ([place]) 12/23/2013    PCP: Jerrol Banana., MD  REFERRING PROVIDER: Earnestine Leys, MD  REFERRING DIAG: [phone] (ICD-10-CM) - Impingement syndrome of left shoulder   THERAPY DIAG:  No diagnosis found.  Rationale for Evaluation and Treatment: Rehabilitation  ONSET DATE: 3-5 years  SUBJECTIVE:                                                                                                                                                                                      SUBJECTIVE STATEMENT:  Pt notes that she has been experiencing pain in the L shoulder for anywhere from 3-5 years.  Pt does not note any specific injury that caused it, but she does report an incident in which she was seeing PT for her neck pain and they had her lie prone and they jerked her elbows into extension and she states that she experienced a sharp pain in the shoulder and it has been aggravating ever since.  She states that she like should have complained but did not do so at the time.    Pt has attempted celebrex off and on.  She was taking it with pantoprazole and her BP was 150/83 and her BP is usually 120/68.  So she stopped taking the NSAIDs and her blood pressure dropped to 148.  PERTINENT HISTORY:  Pt has been having pain in the L shoulder anywhere from 3-5 years.  She states no mechanism of injury that she's aware of, but she had pain following a PT session at [facility].  She states she has been having pain since that point.  She states she received an MRI recently, but did not note any tears.  EDIT: at conclusion of evaluation, pt notes there were microtears of the RTC.  Unable to pull up imaging at this time.   PAIN:  Are you having pain? Yes: NPRS scale: 3/10 Pain location: L Deltoid and Biceps Region Pain  description: Sore, Tender, Dull Aching, Sharp at night sometimes Aggravating factors: Doing planks or lifting mattress to make bed Relieving factors: Rest, Ice, Heat, Medications Worst Pain: 8/10 Best Pain: 2/10  PRECAUTIONS: None  WEIGHT BEARING RESTRICTIONS: No  FALLS:  Has patient fallen in last 6 months? No  LIVING ENVIRONMENT: Lives with: lives alone Lives in: House/apartment Stairs: Yes: External: 1 steps; none Has following equipment at home: None  OCCUPATION: Retired - Geologist, engineering  PLOF: Independent  PATIENT GOALS:  Relieve the pain so she does not have to decide whether to choose between high BP or pain medication.  NEXT MD VISIT: end of this month.  OBJECTIVE:   DIAGNOSTIC FINDINGS:  Unable to see MRI from [facility].  BP taken in sitting on R UE: 124/64 HR: 74  PATIENT SURVEYS:  Quick Dash 31.82 and FOTO 52/61  COGNITION: Overall cognitive status: Within functional limits for tasks assessed     SENSATION: WFL  POSTURE: Pt with fair posture in sitting.  Pt tends to sacral sit in chair.  UPPER EXTREMITY ROM:   Active ROM Right eval Left eval  Shoulder flexion 151 132  Shoulder extension    Shoulder abduction 148 123*  Shoulder adduction    Shoulder internal rotation    Shoulder external rotation    Elbow flexion    Elbow extension    Wrist flexion    Wrist extension    Wrist ulnar deviation    Wrist radial deviation    Wrist pronation    Wrist supination    (Blank rows = not tested)  UPPER EXTREMITY MMT:  MMT Right eval Left eval  Shoulder flexion 5 4-*  Shoulder extension    Shoulder abduction 5 4-*  Shoulder adduction    Shoulder internal rotation    Shoulder external rotation    Middle trapezius    Lower trapezius    Elbow flexion 5 5  Elbow extension 5 5  Wrist flexion    Wrist extension    Wrist ulnar deviation    Wrist radial deviation  Wrist pronation    Wrist supination    Grip strength  (lbs)    (Blank rows = not tested)  SHOULDER SPECIAL TESTS: Impingement tests: Neer impingement test: positive , Hawkins/Kennedy impingement test: positive , Painful arc test: positive , and O' Briens test: positive  Rotator cuff assessment: Empty can test: positive , Full can test: positive , External rotation lag sign: positive , and Belly press test: positive    JOINT MOBILITY TESTING:  Not assessed at this time.   PALPATION:  Tender to palpation along the L acromion.    TODAY'S TREATMENT:                                                                                                                                         DATE: 11/15/21  EVAL only.  Given HEP as noted below and educate on the exercises but unable to perform at this time.  PATIENT EDUCATION: Education details: Pt educated on role of PT and services provided during current POC, along with prognosis and information about the clinic.  Person educated: Patient Education method: Explanation and Verbal cues Education comprehension: verbalized understanding  HOME EXERCISE PROGRAM: Access Code: [email or web address] URL: [email or web address] Date: 11/15/2021 Prepared by: B[place]te [place]  Exercises - Isometric Shoulder Flexion at Wall  - 1 x daily - 7 x weekly - 3 sets - 10 reps - Isometric Shoulder Extension at Wall  - 1 x daily - 7 x weekly - 3 sets - 10 reps - Isometric Shoulder Abduction at Wall  - 1 x daily - 7 x weekly - 3 sets - 10 reps - Isometric Shoulder Adduction  - 1 x daily - 7 x weekly - 3 sets - 10 reps  ASSESSMENT:  CLINICAL IMPRESSION: Patient is an 81 y.o. female who was seen today for physical therapy evaluation and treatment for L shoulder pain.  Pt presents with physical impairments of decreased activity tolerance, decreased ROM of L shoulder, increased pain in L shoulder, and decreased strength in L shoulder as noted above.  Pt will benefit from skilled therapy to address tolerance, ROM, pain,  and strength impairments necessary for improvement in quality of life.  Pt. demonstrates understanding of this plan of care and agrees with this plan.    OBJECTIVE IMPAIRMENTS: decreased ROM, decreased strength, impaired UE functional use, and pain.   ACTIVITY LIMITATIONS: carrying, lifting, bathing, toileting, dressing, reach over head, and hygiene/grooming  PARTICIPATION LIMITATIONS: cleaning, driving, and yard work  PERSONAL FACTORS: Age, Past/current experiences, Time since onset of injury/illness/exacerbation, and 3+ comorbidities: DM2, OA, depression  are also affecting patient's functional outcome.   REHAB POTENTIAL: Good  CLINICAL DECISION MAKING: Stable/uncomplicated  EVALUATION COMPLEXITY: Moderate   GOALS: Goals reviewed with patient? Yes  SHORT TERM GOALS: Target date: 12/13/2021  (Remove Blue Hyperlink)  Pt will be independent with HEP in order to demonstrate increased ability to perform tasks related  to occupation/hobbies. Baseline:  Shoulder isometrics given at eval Goal status: INITIAL   LONG TERM GOALS: Target date: 02/07/2022  (Remove Blue Hyperlink)  Pt. will improve the QuickDASH by 10 points in order to demonstrate increased functional independence with decreased pain. Baseline: 31.82 Goal status: INITIAL  2.  Patient will demonstrate improved function as evidenced by a score of 61 on FOTO measure for full participation in activities at home and in the community. Baseline: 52 Goal status: INITIAL  3.  Pt will increase ROM of L to be within 10% of R (Shoulder flex/abd: > 136 deg/ >133 deg), in order to perform adequate ROM necessary for ADL's and to gain independence.  Baseline: L Shoulder Flex/Abd: 132/123 Goal status: INITIAL  PLAN:  PT FREQUENCY: 2x/week  PT DURATION: 12 weeks  PLANNED INTERVENTIONS: Therapeutic exercises, Therapeutic activity, Neuromuscular re-education, Balance training, Gait training, Patient/Family education, Self Care, Joint  mobilization, Dry Needling, and Manual therapy  PLAN FOR NEXT SESSION: Assess HEP compliance.   Gwenlyn Saran, PT, DPT Physical Therapist- [facility]  11/15/21, 9:25 PM

## 2021-11-15 ENCOUNTER — Ambulatory Visit: Payer: Medicare Other | Attending: Specialist

## 2021-11-15 DIAGNOSIS — M6281 Muscle weakness (generalized): Secondary | ICD-10-CM | POA: Insufficient documentation

## 2021-11-15 DIAGNOSIS — N8189 Other female genital prolapse: Secondary | ICD-10-CM | POA: Insufficient documentation

## 2021-11-15 DIAGNOSIS — M25612 Stiffness of left shoulder, not elsewhere classified: Secondary | ICD-10-CM | POA: Insufficient documentation

## 2021-11-15 DIAGNOSIS — R29898 Other symptoms and signs involving the musculoskeletal system: Secondary | ICD-10-CM | POA: Insufficient documentation

## 2021-11-15 DIAGNOSIS — M25512 Pain in left shoulder: Secondary | ICD-10-CM | POA: Insufficient documentation

## 2021-11-16 NOTE — Addendum Note (Signed)
Addended by: Christie Nottingham on: 11/16/2021 05:55 PM   Modules accepted: Orders

## 2021-11-17 ENCOUNTER — Ambulatory Visit
Admission: RE | Admit: 2021-11-17 | Discharge: 2021-11-17 | Disposition: A | Discharge status: Home / Self Care | Payer: Medicare Other | Source: Ambulatory Visit | Attending: Family Medicine | Admitting: Family Medicine

## 2021-11-17 ENCOUNTER — Other Ambulatory Visit: Payer: Self-pay | Admitting: Family Medicine

## 2021-11-17 ENCOUNTER — Telehealth: Payer: Self-pay | Admitting: Family Medicine

## 2021-11-17 DIAGNOSIS — E039 Hypothyroidism, unspecified: Secondary | ICD-10-CM

## 2021-11-17 DIAGNOSIS — Z1231 Encounter for screening mammogram for malignant neoplasm of breast: Secondary | ICD-10-CM | POA: Insufficient documentation

## 2021-11-17 MED ORDER — LEVOTHYROXINE SODIUM 50 MCG PO TABS: 50.0000 ug | ORAL_TABLET | Freq: Every day | ORAL | 1 refills | Status: DC

## 2021-11-17 NOTE — Telephone Encounter (Signed)
[facility] pharmacy faxed refill request for the following medications:   levothyroxine (SYNTHROID) 50 MCG tablet    Please advise

## 2021-11-18 ENCOUNTER — Ambulatory Visit (INDEPENDENT_AMBULATORY_CARE_PROVIDER_SITE_OTHER): Payer: Medicare Other | Admitting: Family Medicine

## 2021-11-18 ENCOUNTER — Encounter: Payer: Self-pay | Admitting: Family Medicine

## 2021-11-18 VITALS — BP 139/74 | HR 77 | Temp 97.8°F | Resp 16 | Ht 60.0 in | Wt 143.0 lb

## 2021-11-18 DIAGNOSIS — M7542 Impingement syndrome of left shoulder: Secondary | ICD-10-CM | POA: Diagnosis not present

## 2021-11-18 DIAGNOSIS — B9689 Other specified bacterial agents as the cause of diseases classified elsewhere: Secondary | ICD-10-CM

## 2021-11-18 DIAGNOSIS — J019 Acute sinusitis, unspecified: Secondary | ICD-10-CM | POA: Diagnosis not present

## 2021-11-18 HISTORY — DX: Other specified bacterial agents as the cause of diseases classified elsewhere: B96.89

## 2021-11-18 MED ORDER — PREDNISONE 50 MG PO TABS: 50.0000 mg | ORAL_TABLET | Freq: Every day | ORAL | 0 refills | Status: DC

## 2021-11-18 MED ORDER — LEVOFLOXACIN 500 MG PO TABS: 500.0000 mg | ORAL_TABLET | Freq: Every day | ORAL | 0 refills | Status: AC

## 2021-11-18 NOTE — Assessment & Plan Note (Signed)
Acute, stable Complaints of primarily L side sinus pain Not improved with OTC RTC if symptoms worsen and do not improve

## 2021-11-18 NOTE — Progress Notes (Signed)
Established patient visit   Patient: Nicole Young   DOB: September 16, 1940   81 y.o. Female  MRN: [id number] Visit Date: 11/18/2021  Today's healthcare provider: Gwyneth Sprout, FNP  Patient presents for new patient visit to establish care.  Introduced to Designer, jewellery role and practice setting.  All questions answered.  Discussed provider/patient relationship and expectations.  I,Tiffany J Bragg,acting as a scribe for Gwyneth Sprout, FNP.,have documented all relevant documentation on the behalf of Gwyneth Sprout, FNP,as directed by  Gwyneth Sprout, FNP while in the presence of Gwyneth Sprout, FNP.   Chief Complaint  Patient presents with   Cough   Subjective    HPI HPI   Patient complains of fatigue, cough, sinus tenderness for 3 weeks. States she was on amoxicillin for 10 days at the start with little improvement.  Last edited by Smitty Knudsen, CMA on 11/18/2021  4:09 PM.      Medications: Outpatient Medications Prior to Visit  Medication Sig   Accu-Chek FastClix Lancets MISC CHECK SUGAR ONCE DAILY. DX: E11.9   ACCU-CHEK SMARTVIEW test strip USE TO CHECK BLOOD SUGAR ONCE A DAY   benzonatate (TESSALON) 100 MG capsule Take 1 capsule (100 mg total) by mouth 3 (three) times daily as needed for cough.   celecoxib (CELEBREX) 200 MG capsule Take 200 mg by mouth daily. Reports taking as needed   cholecalciferol (VITAMIN D3) 25 MCG (1000 UT) tablet Take 600 Units by mouth daily.   estradiol (ESTRACE) 0.1 MG/GM vaginal cream 1 gram intravaginally at bedtime daily for 2-3 weeks, then decrease to 3 times per week   guaiFENesin (MUCINEX PO) Take by mouth.   guaiFENesin-codeine (CHERATUSSIN AC) 100-10 MG/5ML syrup Take 5 mLs by mouth at bedtime as needed for cough (up to two doses daily).   hydroquinone 4 % cream Apply topically 2 (two) times daily.   levothyroxine (SYNTHROID) 50 MCG tablet Take 1 tablet (50 mcg total) by mouth daily before breakfast.   Magnesium 250 MG TABS Take 1  tablet by mouth daily.   metFORMIN (GLUCOPHAGE-XR) 500 MG 24 hr tablet TAKE 1 TABLET TWICE A DAY (Patient taking differently: 500 mg 2 (two) times daily before a meal.)   methocarbamol (ROBAXIN) 500 MG tablet as needed.   mometasone (ELOCON) 0.1 % cream Apply 1 application topically See admin instructions. Use 1 - 2 times daily until clear, then use as a spot treatment when needed   Multiple Vitamin (MULTIVITAMIN) capsule Take 1 capsule by mouth daily.   Multiple Vitamins-Minerals (PRESERVISION AREDS PO) Take by mouth 2 (two) times daily.   pantoprazole (PROTONIX) 40 MG tablet Take 1 tablet (40 mg total) by mouth daily.   rosuvastatin (CRESTOR) 20 MG tablet TAKE 1 TABLET DAILY   sertraline (ZOLOFT) 100 MG tablet TAKE 1 TABLET DAILY   No facility-administered medications prior to visit.    Review of Systems     Objective    BP 139/74 (BP Location: Left Arm, Patient Position: Sitting, Cuff Size: Normal)   Pulse 77   Temp 97.8 F (36.6 C)   Resp 16   Ht 5' (1.524 m)   Wt 143 lb (64.9 kg)   SpO2 99%   BMI 27.93 kg/m    Physical Exam Vitals and nursing note reviewed.  Constitutional:      General: She is not in acute distress.    Appearance: Normal appearance. She is overweight. She is not ill-appearing, toxic-appearing or diaphoretic.  HENT:     Head: Normocephalic and atraumatic.     Right Ear: Tympanic membrane, ear canal and external ear normal.     Left Ear: Tympanic membrane, ear canal and external ear normal.     Nose:     Right Sinus: Frontal sinus tenderness present.     Left Sinus: Maxillary sinus tenderness and frontal sinus tenderness present.     Mouth/Throat:     Mouth: Mucous membranes are moist.     Pharynx: Oropharynx is clear.  Eyes:     Extraocular Movements: Extraocular movements intact.     Conjunctiva/sclera: Conjunctivae normal.     Pupils: Pupils are equal, round, and reactive to light.  Cardiovascular:     Rate and Rhythm: Normal rate and regular  rhythm.     Pulses: Normal pulses.     Heart sounds: Normal heart sounds. No murmur heard.    No friction rub. No gallop.  Pulmonary:     Effort: Pulmonary effort is normal. No respiratory distress.     Breath sounds: Normal breath sounds. No stridor. No wheezing, rhonchi or rales.  Chest:     Chest wall: No tenderness.  Musculoskeletal:        General: No swelling, tenderness, deformity or signs of injury. Normal range of motion.     Cervical back: Normal range of motion and neck supple.     Right lower leg: No edema.     Left lower leg: No edema.  Skin:    General: Skin is warm and dry.     Capillary Refill: Capillary refill takes less than 2 seconds.     Coloration: Skin is not jaundiced or pale.     Findings: No bruising, erythema, lesion or rash.  Neurological:     General: No focal deficit present.     Mental Status: She is alert and oriented to person, place, and time. Mental status is at baseline.     Cranial Nerves: No cranial nerve deficit.     Sensory: No sensory deficit.     Motor: No weakness.     Coordination: Coordination normal.  Psychiatric:        Mood and Affect: Mood normal.        Behavior: Behavior normal.        Thought Content: Thought content normal.        Judgment: Judgment normal.      No results found for any visits on 11/18/21.  Assessment & Plan     Problem List Items Addressed This Visit       Respiratory   Acute bacterial sinusitis - Primary    Acute, stable Complaints of primarily L side sinus pain Not improved with OTC RTC if symptoms worsen and do not improve       Relevant Medications   predniSONE (DELTASONE) 50 MG tablet   levofloxacin (LEVAQUIN) 500 MG tablet   Return if symptoms worsen or fail to improve.     Vonna Kotyk, FNP, have reviewed all documentation for this visit. The documentation on 11/18/21 for the exam, diagnosis, procedures, and orders are all accurate and complete.  Gwyneth Sprout, [place] [phone] (phone) [phone] (fax)  [place]

## 2021-11-21 ENCOUNTER — Telehealth: Payer: Self-pay

## 2021-11-21 NOTE — Telephone Encounter (Signed)
Patient called in to say she wants another antibiotic script due to the one she was prescribed effecting her diabetes.

## 2021-11-22 ENCOUNTER — Ambulatory Visit: Payer: Medicare Other

## 2021-11-22 DIAGNOSIS — M25512 Pain in left shoulder: Secondary | ICD-10-CM | POA: Diagnosis not present

## 2021-11-22 DIAGNOSIS — R29898 Other symptoms and signs involving the musculoskeletal system: Secondary | ICD-10-CM

## 2021-11-22 DIAGNOSIS — M6281 Muscle weakness (generalized): Secondary | ICD-10-CM

## 2021-11-22 DIAGNOSIS — M25612 Stiffness of left shoulder, not elsewhere classified: Secondary | ICD-10-CM | POA: Diagnosis not present

## 2021-11-22 DIAGNOSIS — N8189 Other female genital prolapse: Secondary | ICD-10-CM | POA: Diagnosis not present

## 2021-11-22 NOTE — Therapy (Signed)
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[place])    Past Surgical History:  Procedure Laterality Date   AUGMENTATION MAMMAPLASTY  1975   AUGMENTATION MAMMAPLASTY  2017   2nd augmentation and lift   BREAST SURGERY     impants replaced with lift   CARPAL TUNNEL RELEASE Right 08/26/2014   Procedure: CARPAL TUNNEL RELEASE;  Surgeon: Earnestine Leys, MD;  Location: [facility] ORS;  Service: Orthopedics;  Laterality: Right;   COLONOSCOPY  2005   Dr Bary Castilla   COLONOSCOPY N/A 06/24/2014   Procedure: COLONOSCOPY;  Surgeon: Robert Bellow, MD;  [facility] END[facility]COPY;  Service: Endoscopy;  Laterality: N/A;   PARATHYROIDECTOMY  1992   PLACEMENT OF BREAST IMPLANTS  1975   TONSILECTOMY, ADENOIDECTOMY, BILATERAL MYRINGOTOMY AND TUBES     Patient Active Problem List   Diagnosis Date Noted   Premature ventricular contractions 08/09/2020   Heart palpitations 07/06/2020   Pure hypercholesterolemia 07/06/2020   Vaginal atrophy 08/16/2018   Rectocele 08/16/2018   Osteoarthritis of knee 10/10/2017   Scoliosis of lumbar spine 07/06/2016   Degeneration of lumbar intervertebral disc 07/06/2016   Arthralgia of multiple joints 11/12/2014   Allergic rhinitis 11/12/2014   Diabetes mellitus ([place]) 11/12/2014   Essential (primary) hypertension 11/12/2014   Personal history of other drug therapy 11/12/2014   HLD (hyperlipidemia) 11/12/2014   Adult hypothyroidism 11/12/2014    Mild major depression ([place]) 11/12/2014   Overweight 11/12/2014   Restless leg 11/12/2014   Encounter for screening colonoscopy 04/17/2014   Type 2 diabetes mellitus ([place]) 12/23/2013   PCP: Jerrol Banana., MD  REFERRING PROVIDER: Earnestine Leys, MD  REFERRING DIAG: [phone] (ICD-10-CM) - Impingement syndrome of left shoulder   THERAPY DIAG:  No diagnosis found.  Rationale for Evaluation and Treatment: Rehabilitation  ONSET DATE: 3-5 years  SUBJECTIVE:                                                                                                                                                                                      SUBJECTIVE STATEMENT:  Patient  reports to therapy with some minor shoulder pain today. The patient reports pain in her left shoulder around a 3-4/10 on the NPRS. The patient reports she has been complaint with the two HEP isometric exercises she was given following her evaluation and she believes they have been helping a lot.   PERTINENT HISTORY:  Pt has been having pain in the L shoulder anywhere from 3-5 years.  She states no mechanism of injury that she's aware of, but she had pain following a PT session at [facility].  She states she has been having pain since that point.  She states she received an MRI recently, but did not note any tears.  EDIT: at conclusion of evaluation, pt notes there were microtears of the RTC.  Unable to pull up imaging at this time.  PAIN:  Are you having pain? Yes: NPRS scale: 3/10 Pain location: L Deltoid and Biceps Region Pain description: Sore, Tender, Dull Aching, Sharp at night sometimes Aggravating factors: Doing planks or lifting mattress to make bed Relieving factors: Rest, Ice, Heat, Medications Worst Pain: 8/10 Best Pain: 2/10  PRECAUTIONS: None  WEIGHT BEARING RESTRICTIONS: No  FALLS:  Has patient fallen in last 6 months? No  LIVING ENVIRONMENT: Lives with: lives alone Lives in:  House/apartment Stairs: Yes: External: 1 steps; none Has following equipment at home: None  OCCUPATION: Retired - Geologist, engineering  PLOF: Independent  PATIENT GOALS:  Relieve the pain so she does not have to decide whether to choose between high BP or pain medication.  NEXT MD VISIT: end of this month.  OBJECTIVE:   DIAGNOSTIC FINDINGS:  Unable to see MRI from [facility].  BP taken in sitting on R UE: 124/64 HR: 74  PATIENT SURVEYS:  Quick Dash 31.82 and FOTO 52/61  COGNITION: Overall cognitive status: Within functional limits for tasks assessed     SENSATION: WFL  POSTURE: Pt with fair posture in sitting.  Pt tends to sacral sit in chair.  UPPER EXTREMITY ROM:   Active ROM Right eval Left eval  Shoulder flexion 151 132  Shoulder extension    Shoulder abduction 148 123*  Shoulder adduction    Shoulder internal rotation    Shoulder external rotation    Elbow flexion    Elbow extension    Wrist flexion    Wrist extension    Wrist ulnar deviation    Wrist radial deviation    Wrist pronation    Wrist supination    (Blank rows = not tested)  UPPER EXTREMITY MMT:  MMT Right eval Left eval  Shoulder flexion 5 4-*  Shoulder extension    Shoulder abduction 5 4-*  Shoulder adduction    Shoulder internal rotation    Shoulder external rotation    Middle trapezius    Lower trapezius    Elbow flexion 5 5  Elbow extension 5 5  Wrist flexion    Wrist extension    Wrist ulnar deviation    Wrist radial deviation    Wrist pronation    Wrist supination    Grip strength (lbs)    (Blank rows = not tested)  SHOULDER SPECIAL TESTS: Impingement tests: Neer impingement test: positive , Hawkins/Kennedy impingement test: positive , Painful arc test: positive , and O' Briens test: positive  Rotator cuff assessment: Empty can test: positive , Full can test: positive , External rotation lag sign: positive , and Belly press test: positive    JOINT  MOBILITY TESTING:  Not assessed at this  time.  PALPATION:  Tender to palpation along the L acromion.   TODAY'S TREATMENT:                                                                                                                                         DATE: 11/15/21  Seated Vitals:  BP - 1144/70 Pulse - 80  Manual Therapy: (L) shoulder  - PROM flexion 5x10 second hold before pain, pain felt at end range - PROM Abduction 5x10 second hold before pain, pain felt at end range - PROM Horizontal Adductions 5x10 second hold   [place] PAM:  - AP glide Grade 1-2  - Inferior glide Grade 1-2 - GH distraction (Patient reported relief in shoulder pain with distraction) 3x20 seconds  There Ex: Manual Isometrics: (Manual resistance applied from PT)  - ER 5x10 seconds  - IR 5x10 seconds  - Isometrics ADD 5x10 seconds - Isometrics ABD 5x10 seconds (Patient was unable to perform isometric above 50 deg of shoulder abduction)   - RTB Bilateral shoulder ER 10x (*Use yellow TB next session instead)  - Bilateral shoulder flexion while pulling RTB apart 10x  - Shoulder flexion ladder walks 5x 10 seconds hold at end range  - Shoulder Abduction ladder walk 5x 10 second hold at end range   PATIENT EDUCATION: Education details: Pt educated on role of PT and services provided during current POC, along with prognosis and information about the clinic.  Person educated: Patient Education method: Explanation and Verbal cues Education comprehension: verbalized understanding  HOME EXERCISE PROGRAM: Access Code: [email or web address] URL: [email or web address] Date: 11/15/2021 Prepared by: Courtdale Nation  Exercises - Isometric Shoulder Flexion at Wall  - 1 x daily - 7 x weekly - 3 sets - 10 reps - Isometric Shoulder Extension at Wall  - 1 x daily - 7 x weekly - 3 sets - 10 reps - Isometric Shoulder Abduction at Wall  - 1 x daily - 7 x weekly - 3 sets - 10 reps - Isometric Shoulder Adduction  - 1 x  daily - 7 x weekly - 3 sets - 10 reps  ASSESSMENT:  CLINICAL IMPRESSION:  The patient performed well to manual therapy of the (L) shoulder without any increase in shoulder pain. The patient reported some minor pain with PROM of the shoulder in every direction performed (flx, abd, horizontal add), PROM was keep within a pain free range for today's session to prevent aggravation prior to exercise. The patient responded well to [facility] distraction reporting a reduction in overall (L) shoulder pain. The patient responded great to isometric exercises to load the RTC tendons without causing aggravation. The patient was educated on the final two exercises of her prescribed HEP. Pt will continue benefit from skilled therapy to address tolerance, ROM, pain, and strength impairments necessary for improvement in quality of life.    OBJECTIVE IMPAIRMENTS: decreased ROM, decreased strength, impaired UE functional  use, and pain.   ACTIVITY LIMITATIONS: carrying, lifting, bathing, toileting, dressing, reach over head, and hygiene/grooming  PARTICIPATION LIMITATIONS: cleaning, driving, and yard work  PERSONAL FACTORS: Age, Past/current experiences, Time since onset of injury/illness/exacerbation, and 3+ comorbidities: DM2, OA, depression  are also affecting patient's functional outcome.   REHAB POTENTIAL: Good  CLINICAL DECISION MAKING: Stable/uncomplicated  EVALUATION COMPLEXITY: Moderate   GOALS: Goals reviewed with patient? Yes  SHORT TERM GOALS: Target date: 12/13/2021  (Remove Blue Hyperlink)  Pt will be independent with HEP in order to demonstrate increased ability to perform tasks related to occupation/hobbies. Baseline:  Shoulder isometrics given at eval Goal status: INITIAL   LONG TERM GOALS: Target date: 02/07/2022  (Remove Blue Hyperlink)  Pt. will improve the QuickDASH by 10 points in order to demonstrate increased functional independence with decreased pain. Baseline: 31.82 Goal status:  INITIAL  2.  Patient will demonstrate improved function as evidenced by a score of 61 on FOTO measure for full participation in activities at home and in the community. Baseline: 52 Goal status: INITIAL  3.  Pt will increase ROM of L to be within 10% of R (Shoulder flex/abd: > 136 deg/ >133 deg), in order to perform adequate ROM necessary for ADL's and to gain independence.  Baseline: L Shoulder Flex/Abd: 132/123 Goal status: INITIAL  PLAN:  PT FREQUENCY: 2x/week  PT DURATION: 12 weeks  PLANNED INTERVENTIONS: Therapeutic exercises, Therapeutic activity, Neuromuscular re-education, Balance training, Gait training, Patient/Family education, Self Care, Joint mobilization, Dry Needling, and Manual therapy  PLAN FOR NEXT SESSION: Continue to load the shoulder within a pain free range and progress PROM. Try PAM of [place] joint again and check patient response.   Sudie Bailey SPT   This entire session was performed under direct supervision and direction of a licensed therapist/therapist assistant . I have personally read, edited and approve of the note as written.  Janna Arch, PT, DPT  [facility]  11/15/21, 9:25 PM

## 2021-11-23 ENCOUNTER — Ambulatory Visit
Admission: EM | Admit: 2021-11-23 | Discharge: 2021-11-23 | Disposition: A | Discharge status: Home / Self Care | Payer: Medicare Other | Attending: Urgent Care | Admitting: Urgent Care

## 2021-11-23 DIAGNOSIS — J0111 Acute recurrent frontal sinusitis: Secondary | ICD-10-CM | POA: Diagnosis not present

## 2021-11-23 MED ORDER — AMOXICILLIN-POT CLAVULANATE ER 1000-62.5 MG PO TB12: 2.0000 | ORAL_TABLET | Freq: Two times a day (BID) | ORAL | 0 refills | Status: AC

## 2021-11-23 NOTE — ED Triage Notes (Signed)
Pt. Presents to UC w/ c/o a burning sensation in her sinus cavities and postnasal drip for the past month. Pt. States she was seen and treated here for a sinus infection and feels as if the symptoms did get better but have recently returned and worsened.

## 2021-11-23 NOTE — Telephone Encounter (Signed)
Spoke with patient.

## 2021-11-23 NOTE — Discharge Instructions (Addendum)
You are being treated today for recurrent bacterial sinusitis with high-dose Augmentin.  Recommend that you take this medicine with food to avoid stomach irritation.  Continue to use Flonase twice a day.  Follow up here or with your primary care provider if your symptoms are worsening or not improving with treatment.

## 2021-11-23 NOTE — ED Provider Notes (Addendum)
Nicole Young    CSN: [id number] Arrival date & time: 11/23/21  0954      History   Chief Complaint Chief Complaint  Patient presents with   Facial Pain    HPI Nicole Young is a 81 y.o. female.   HPI   Presents to UC with complaint of sinusitis x1 month.  She endorses a burning feeling across the top of her eyebrows and below her eyes.  She also reports constant dripping down the back of her throat.  Patient states she was seen here for sinus infection about 1 month ago and prescribed an antibiotic.  She states that her symptoms did improve but then recurred.  Reports that she went to a physician assistant at her primary care practice who prescribed Levaquin about 2 weeks ago.  However she did not take this medication because when she picked it up, she received a 3 page warning from her pharmacy stating that patients with diabetes should not take this medication.  PMH significant for diabetes, most recent hemoglobin A1c is 10 months ago and a very well controlled 6.6.  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[facility])     Patient Active Problem List   Diagnosis Date Noted   Acute bacterial sinusitis 11/18/2021   Premature ventricular contractions 08/09/2020   Heart palpitations 07/06/2020   Pure hypercholesterolemia 07/06/2020   Vaginal atrophy 08/16/2018   Rectocele 08/16/2018   Osteoarthritis of knee 10/10/2017   Scoliosis of lumbar spine 07/06/2016   Degeneration of lumbar intervertebral disc 07/06/2016   Arthralgia of multiple joints 11/12/2014   Allergic rhinitis 11/12/2014    Diabetes mellitus ([place]) 11/12/2014   Essential (primary) hypertension 11/12/2014   Personal history of other drug therapy 11/12/2014   HLD (hyperlipidemia) 11/12/2014   Adult hypothyroidism 11/12/2014   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   Overweight 11/12/2014   Restless leg 11/12/2014   Encounter for screening colonoscopy 04/17/2014   Type 2 diabetes mellitus ([place]) 12/23/2013    Past Surgical History:  Procedure Laterality Date   AUGMENTATION MAMMAPLASTY  1975   AUGMENTATION MAMMAPLASTY  2017   2nd augmentation and lift   BREAST SURGERY     impants replaced with lift   CARPAL TUNNEL RELEASE Right 08/26/2014   Procedure: CARPAL TUNNEL RELEASE;  Surgeon: Earnestine Leys, MD;  Location: [facility] ORS;  Service: Orthopedics;  Laterality: Right;   COLONOSCOPY  2005   Dr Bary Castilla   COLONOSCOPY N/A 06/24/2014   Procedure: COLONOSCOPY;  Surgeon: Robert Bellow, MD;  Location: [facility] ENDOSCOPY;  Service: Endoscopy;  Laterality: N/A;   PARATHYROIDECTOMY  1992   PLACEMENT OF BREAST IMPLANTS  1975   TONSILECTOMY, ADENOIDECTOMY, BILATERAL MYRINGOTOMY AND TUBES      OB History     Gravida  4   Para  3   Term      Preterm      AB  1   Living         SAB  1   IAB  Ectopic      Multiple      Live Births           Obstetric Comments  1st Menstrual Cycle:  12  1st Pregnancy: 22          Home Medications    Prior to Admission medications   Medication Sig Start Date End Date Taking? Authorizing Provider  Accu-Chek FastClix Lancets MISC CHECK SUGAR ONCE DAILY. DX: E11.9 09/20/18   Jerrol Banana., MD  ACCU-CHEK SMARTVIEW test strip USE TO CHECK BLOOD SUGAR ONCE A DAY 11/26/20   Jerrol Banana., MD  benzonatate (TESSALON) 100 MG capsule Take 1 capsule (100 mg total) by mouth 3 (three) times daily as needed for cough. 10/25/21   Sharion Balloon, NP  celecoxib (CELEBREX) 200 MG capsule Take 200 mg by mouth daily. Reports taking as needed    [provider]  cholecalciferol (VITAMIN D3) 25 MCG (1000 UT) tablet Take 600 Units by mouth daily.    [provider]  estradiol (ESTRACE) 0.1 MG/GM vaginal cream 1 gram intravaginally at bedtime daily for 2-3 weeks, then decrease to 3 times per week 10/31/21   Bjorn Loser, MD  guaiFENesin (MUCINEX PO) Take by mouth.    [provider]  guaiFENesin-codeine (CHERATUSSIN AC) 100-10 MG/5ML syrup Take 5 mLs by mouth at bedtime as needed for cough (up to two doses daily). 02/22/21   Mikey Kirschner, PA-C  hydroquinone 4 % cream Apply topically 2 (two) times daily. 12/08/20   Brendolyn Patty, MD  levofloxacin (LEVAQUIN) 500 MG tablet Take 1 tablet (500 mg total) by mouth daily for 7 days. 11/18/21 11/25/21  Gwyneth Sprout, FNP  levothyroxine (SYNTHROID) 50 MCG tablet Take 1 tablet (50 mcg total) by mouth daily before breakfast. 11/17/21   Simmons-Robinson, [place], MD  Magnesium 250 MG TABS Take 1 tablet by mouth daily.    [provider]  metFORMIN (GLUCOPHAGE-XR) 500 MG 24 hr tablet TAKE 1 TABLET TWICE A DAY Patient taking differently: 500 mg 2 (two) times daily before a meal. 05/26/21   Jerrol Banana., MD  methocarbamol (ROBAXIN) 500 MG tablet as needed.    [provider]  mometasone (ELOCON) 0.1 % cream Apply 1 application topically See admin instructions. Use 1 - 2 times daily until clear, then use as a spot treatment when needed 02/15/21   Brendolyn Patty, MD  Multiple Vitamin (MULTIVITAMIN) capsule Take 1 capsule by mouth daily.    [provider]  Multiple Vitamins-Minerals (PRESERVISION AREDS PO) Take by mouth 2 (two) times daily.    [provider]  pantoprazole (PROTONIX) 40 MG tablet Take 1 tablet (40 mg total) by mouth daily. 10/04/21   Jerrol Banana., MD  predniSONE (DELTASONE) 50 MG tablet Take 1 tablet (50 mg total) by mouth daily with breakfast. 11/18/21   Gwyneth Sprout, FNP  rosuvastatin (CRESTOR) 20 MG tablet TAKE 1  TABLET DAILY 03/30/21   Jerrol Banana., MD  sertraline (ZOLOFT) 100 MG tablet TAKE 1 TABLET DAILY 04/04/21   Jerrol Banana., MD    Family History Family History  Problem Relation Age of Onset   Cancer Father        prostate   Prostate cancer Father    Hypertension Mother    Heart attack Mother    Heart attack Maternal Grandfather    Diabetes Paternal Uncle    Congestive Heart Failure Maternal Grandmother    CVA  Paternal Grandmother    Prostate cancer Paternal Grandfather    Breast cancer Maternal Aunt     Social History Social History   Tobacco Use   Smoking status: Never   Smokeless tobacco: Never  Vaping Use   Vaping Use: Never used  Substance Use Topics   Alcohol use: Yes    Alcohol/week: 1.0 standard drink of alcohol    Types: 1 Glasses of wine per week   Drug use: No     Allergies   Neomycin-polymyxin-gramicidin, Farxiga [dapagliflozin], Neosporin [neomycin-bacitracin zn-polymyx], Sulfa antibiotics, Thimerosal (thiomersal), and Zostavax  [zoster vaccine live]   Review of Systems Review of Systems   Physical Exam Triage Vital Signs ED Triage Vitals  Enc Vitals Group     BP 11/23/21 1004 (!) 143/79     Pulse Rate 11/23/21 1004 79     Resp 11/23/21 1004 18     Temp 11/23/21 1004 97.7 F (36.5 C)     Temp src --      SpO2 11/23/21 1004 97 %     Weight --      Height --      Head Circumference --      Peak Flow --      Pain Score 11/23/21 1005 0     Pain Loc --      Pain Edu? --      Excl. in [place]? --    No data found.  Updated Vital Signs BP (!) 143/79   Pulse 79   Temp 97.7 F (36.5 C)   Resp 18   SpO2 97%   Visual Acuity Right Eye Distance:   Left Eye Distance:   Bilateral Distance:    Right Eye Near:   Left Eye Near:    Bilateral Near:     Physical Exam Vitals reviewed.  Constitutional:      Appearance: Normal appearance.  HENT:     Nose: Congestion present. No rhinorrhea.     Right Sinus: Maxillary sinus  tenderness and frontal sinus tenderness present.     Left Sinus: Maxillary sinus tenderness and frontal sinus tenderness present.  Skin:    General: Skin is warm and dry.  Neurological:     General: No focal deficit present.     Mental Status: She is alert and oriented to person, place, and time.  Psychiatric:        Mood and Affect: Mood normal.        Behavior: Behavior normal.      UC Treatments / Results  Labs (all labs ordered are listed, but only abnormal results are displayed) Labs Reviewed - No data to display  EKG   Radiology No results found.  Procedures Procedures (including critical care time)  Medications Ordered in UC Medications - No data to display  Initial Impression / Assessment and Plan / UC Course  I have reviewed the triage vital signs and the nursing notes.  Pertinent labs & imaging results that were available during my care of the patient were reviewed by me and considered in my medical decision making (see chart for details).   Nasal congestion is present with frontal and maxillary sinus tenderness.  Will treat for recurrent sinusitis, presumed bacterial, with amox 2g/clav 125 mg.  Discussed other provider's prescription for Levaquin and informed patient that it was also an appropriate treatment for recurrent sinusitis-that I did not know of any contradiction for diabetes patients specifically, however, that we are careful with dosing for patients with reduced  kidney function that is sometimes common among advanced diabetes patients.  According to her most recent labs, her kidney function is still WNL with GFR equal 87.   Final Clinical Impressions(s) / UC Diagnoses   Final diagnoses:  None   Discharge Instructions   None    ED Prescriptions   None    PDMP not reviewed this encounter.   Rose Phi, FNP 11/23/21 Love[place]nie Main, [place] 11/23/21 1022

## 2021-11-29 ENCOUNTER — Ambulatory Visit: Payer: Medicare Other

## 2021-11-29 DIAGNOSIS — M25612 Stiffness of left shoulder, not elsewhere classified: Secondary | ICD-10-CM | POA: Diagnosis not present

## 2021-11-29 DIAGNOSIS — M6281 Muscle weakness (generalized): Secondary | ICD-10-CM | POA: Diagnosis not present

## 2021-11-29 DIAGNOSIS — R29898 Other symptoms and signs involving the musculoskeletal system: Secondary | ICD-10-CM

## 2021-11-29 DIAGNOSIS — M25512 Pain in left shoulder: Secondary | ICD-10-CM

## 2021-11-29 DIAGNOSIS — N8189 Other female genital prolapse: Secondary | ICD-10-CM | POA: Diagnosis not present

## 2021-11-29 NOTE — Therapy (Signed)
OUTPATIENT PHYSICAL THERAPY SHOULDER TREATMENT   Patient Name: Nicole Young MRN: [id number] DOB:1940/10/01, 81 y.o., female Today's Date: 11/29/2021  PT End of Session - 11/29/21 1430     Visit Number 3    Number of Visits 24    Date for PT Re-Evaluation 02/07/22    Authorization Type IE: 08/03/21; RC: 11/01/21    Progress Note Due on Visit 10    PT Start Time 1430    PT Stop Time 1515    PT Time Calculation (min) 45 min    Activity Tolerance Patient tolerated treatment well            Past Medical History:  Diagnosis Date   AMD (acid maltase deficiency) (HCC)    Complication of anesthesia    slow to wake up in past   Depression    controlled   Diabetes mellitus without complication ([place])    controlled   Hyperlipidemia    Hypothyroidism    Neuropathy    Osteoarthritis    knee, ankles, shoulders, back, neck, hands/fingers, wrists;    Rectocele    Thyroid disease    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[place])    Past Surgical History:  Procedure Laterality Date   AUGMENTATION MAMMAPLASTY  1975   AUGMENTATION MAMMAPLASTY  2017   2nd augmentation and lift   BREAST SURGERY     impants replaced with lift   CARPAL TUNNEL RELEASE Right 08/26/2014   Procedure: CARPAL TUNNEL RELEASE;  Surgeon: Earnestine Leys, MD;  Location: [facility] ORS;  Service: Orthopedics;  Laterality: Right;   COLONOSCOPY  2005   Dr Bary Castilla   COLONOSCOPY N/A 06/24/2014   Procedure: COLONOSCOPY;  Surgeon: Robert Bellow, MD;  Location[facility] ENDOSCOPY;  Service: Endoscopy;  Laterality: N/A;   PARATHYROIDECTOMY  1992   PLACEMENT OF BREAST IMPLANTS  1975   TONSILECTOMY, ADENOIDECTOMY, BILATERAL MYRINGOTOMY AND TUBES     Patient Active Problem List   Diagnosis Date Noted   Acute bacterial sinusitis 11/18/2021   Premature ventricular contractions 08/09/2020   Heart palpitations 07/06/2020   Pure hypercholesterolemia 07/06/2020   Vaginal atrophy 08/16/2018   Rectocele 08/16/2018   Osteoarthritis of  knee 10/10/2017   Scoliosis of lumbar spine 07/06/2016   Degeneration of lumbar intervertebral disc 07/06/2016   Arthralgia of multiple joints 11/12/2014   Allergic rhinitis 11/12/2014   Diabetes mellitus ([place]) 11/12/2014   Essential (primary) hypertension 11/12/2014   Personal history of other drug therapy 11/12/2014   HLD (hyperlipidemia) 11/12/2014   Adult hypothyroidism 11/12/2014   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   Overweight 11/12/2014   Restless leg 11/12/2014   Encounter for screening colonoscopy 04/17/2014   Type 2 diabetes mellitus ([place]) 12/23/2013   PCP: Jerrol Banana., MD  REFERRING PROVIDER: Earnestine Leys, MD  REFERRING DIAG: [phone] (ICD-10-CM) - Impingement syndrome of left shoulder   THERAPY DIAG:  Muscle weakness (generalized)  Decreased ROM of left shoulder  Weakness of shoulder  Acute pain of left shoulder  Rationale for Evaluation and Treatment: Rehabilitation  ONSET DATE: 3-5 years  SUBJECTIVE:  SUBJECTIVE STATEMENT:  Patient reports she aggravated her shoulder 3 days ago doing her HEP. The patient performed shoulder extension isometrics at 60 deg of shoulder abduction instead of at her side as she was instructed and felt a sharp pain in the anterior aspect of her (L) shoulder. Patient reports her current pain level around a 2/10 and at its worst a 6/10 since the aggravation. The patient reports movements that are causing her pain include turning her steering wheel, overhead movements, and putting clothes on. Patient reports she has been taking celebrex and tylenol for the pain.   PERTINENT HISTORY:  Pt has been having pain in the L shoulder anywhere from 3-5 years.  She states no mechanism of injury that she's aware of, but she had pain following a PT session  at [facility].  She states she has been having pain since that point.  She states she received an MRI recently, but did not note any tears.  EDIT: at conclusion of evaluation, pt notes there were microtears of the RTC.  Unable to pull up imaging at this time.  PAIN:  Are you having pain? Yes: NPRS scale: 3/10 Pain location: L Deltoid and Biceps Region Pain description: Sore, Tender, Dull Aching, Sharp at night sometimes Aggravating factors: Doing planks or lifting mattress to make bed Relieving factors: Rest, Ice, Heat, Medications Worst Pain: 7/10 Best Pain: 2/10  PRECAUTIONS: None  WEIGHT BEARING RESTRICTIONS: No  FALLS:  Has patient fallen in last 6 months? No  LIVING ENVIRONMENT: Lives with: lives alone Lives in: House/apartment Stairs: Yes: External: 1 steps; none Has following equipment at home: None  OCCUPATION: Retired - Geologist, engineering  PLOF: Independent  PATIENT GOALS:  Relieve the pain so she does not have to decide whether to choose between high BP or pain medication.  NEXT MD VISIT: end of this month.  OBJECTIVE:   DIAGNOSTIC FINDINGS:  Unable to see MRI from [facility].  BP taken in sitting on R UE: 124/64 HR: 74  PATIENT SURVEYS:  Quick Dash 31.82 and FOTO 52/61  COGNITION: Overall cognitive status: Within functional limits for tasks assessed     SENSATION: WFL  POSTURE: Pt with fair posture in sitting.  Pt tends to sacral sit in chair.  UPPER EXTREMITY ROM:   Active ROM Right eval Left eval  Shoulder flexion 151 132  Shoulder extension    Shoulder abduction 148 123*  Shoulder adduction    Shoulder internal rotation    Shoulder external rotation    Elbow flexion    Elbow extension    Wrist flexion    Wrist extension    Wrist ulnar deviation    Wrist radial deviation    Wrist pronation    Wrist supination    (Blank rows = not tested)  UPPER EXTREMITY MMT:  MMT Right eval Left eval  Shoulder flexion 5 4-*   Shoulder extension    Shoulder abduction 5 4-*  Shoulder adduction    Shoulder internal rotation    Shoulder external rotation    Middle trapezius    Lower trapezius    Elbow flexion 5 5  Elbow extension 5 5  Wrist flexion    Wrist extension    Wrist ulnar deviation    Wrist radial deviation    Wrist pronation    Wrist supination    Grip strength (lbs)    (Blank rows = not tested)  SHOULDER SPECIAL TESTS: Impingement tests: Neer impingement test: positive , Hawkins/Kennedy impingement test:  positive , Painful arc test: positive , and O' Briens test: positive  Rotator cuff assessment: Empty can test: positive , Full can test: positive , External rotation lag sign: positive , and Belly press test: positive   JOINT MOBILITY TESTING:  Not assessed at this time.  PALPATION:  Tender to palpation along the L acromion.   TODAY'S TREATMENT:                                                                                                                                         DATE: 11/29/21   Seated Vitals:  BP - 134/71 Pulse - 74  Manual Therapy: (L) shoulder  - PROM Flexion 5x10 second hold before pain, pain felt at end range - PROM Abduction 5x10 second hold before pain, pain felt at end range  [place] PAM:  - AP glide Grade 1-2, 1 minute - PA glide Grade 1-2, 1 minute  - Inferior glide Grade 1-2, 1 minute  - GH distraction 3x20 seconds  (Patient reported relief in shoulder pain with all [place] PAM today)   There Ex: Manual Isometrics: (Manual resistance applied from PT)  - Flexion 5x10 seconds - Extension 5x10 seconds - ER 5x10 seconds  - IR 5x10 seconds  - ADD 5x10 seconds - ABD 5x10 seconds   *Patient was educated on all HEP exercises to confirm understanding and prevent any more aggravation with HEP* (5x5 seconds for all)   (Patient couldn't complete TRX assisted shoulder abduction without further aggravation of shoulder)   - Shoulder Flexion with dowel 10x -  Shoulder Abduction with dowel 10x  Manual Hot Pack 5 minutes at end of session   PATIENT EDUCATION: Education details: Pt educated on role of PT and services provided during current POC, along with prognosis and information about the clinic.  Person educated: Patient Education method: Explanation and Verbal cues Education comprehension: verbalized understanding  HOME EXERCISE PROGRAM: Access Code: [facility] URL: [email or web address] Date: 11/29/2021 Prepared by: Janna Arch  Exercises - Seated Scapular Retraction  - 1 x daily - 7 x weekly - 2 sets - 10 reps - 5 hold - Isometric Shoulder Abduction at Wall  - 1 x daily - 7 x weekly - 2 sets - 10 reps - 5 hold - Isometric Shoulder External Rotation at Wall  - 1 x daily - 7 x weekly - 2 sets - 10 reps - 5 hold - Standing Isometric Shoulder Internal Rotation at Doorway  - 1 x daily - 7 x weekly - 2 sets - 10 reps - 5 hold - Isometric Shoulder Flexion at Wall  - 1 x daily - 7 x weekly - 2 sets - 10 reps - 5 hold - Isometric Shoulder Extension at Wall  - 1 x daily - 7 x weekly - 2 sets - 10 reps - 5 hold  ASSESSMENT:  CLINICAL IMPRESSION:  The patient presented to therapy  today with some aggravation in her (L) shoulder. The patient aggravated her shoulder while performing her HEP improperly, the patient was educated on proper technique and demonstrated her understanding of the HEP following practice with SPT. The goal of therapy today was pain reduction and management. The patient responded great to manual therapy of the (L) shoulder and reported a decrease in pain following. The patient responded well to [facility] distraction, [place] PAM, and isometric exercises to load the RTC tendons. PROM and therapeutic exercise was keep within a pain free range for today's session to prevent further aggravation. Pt will continue benefit from skilled therapy to address tolerance, ROM, pain, and strength impairments necessary for improvement in quality  of life.    OBJECTIVE IMPAIRMENTS: decreased ROM, decreased strength, impaired UE functional use, and pain.   ACTIVITY LIMITATIONS: carrying, lifting, bathing, toileting, dressing, reach over head, and hygiene/grooming  PARTICIPATION LIMITATIONS: cleaning, driving, and yard work  PERSONAL FACTORS: Age, Past/current experiences, Time since onset of injury/illness/exacerbation, and 3+ comorbidities: DM2, OA, depression  are also affecting patient's functional outcome.   REHAB POTENTIAL: Good  CLINICAL DECISION MAKING: Stable/uncomplicated  EVALUATION COMPLEXITY: Moderate   GOALS: Goals reviewed with patient? Yes  SHORT TERM GOALS: Target date: 12/27/2021  (Remove Blue Hyperlink)  Pt will be independent with HEP in order to demonstrate increased ability to perform tasks related to occupation/hobbies. Baseline:  Shoulder isometrics given at eval Goal status: INITIAL   LONG TERM GOALS: Target date: 02/21/2022  (Remove Blue Hyperlink)  Pt. will improve the QuickDASH by 10 points in order to demonstrate increased functional independence with decreased pain. Baseline: 31.82 Goal status: INITIAL  2.  Patient will demonstrate improved function as evidenced by a score of 61 on FOTO measure for full participation in activities at home and in the community. Baseline: 52 Goal status: INITIAL  3.  Pt will increase ROM of L to be within 10% of R (Shoulder flex/abd: > 136 deg/ >133 deg), in order to perform adequate ROM necessary for ADL's and to gain independence.  Baseline: L Shoulder Flex/Abd: 132/123 Goal status: INITIAL  PLAN:  PT FREQUENCY: 2x/week  PT DURATION: 12 weeks  PLANNED INTERVENTIONS: Therapeutic exercises, Therapeutic activity, Neuromuscular re-education, Balance training, Gait training, Patient/Family education, Self Care, Joint mobilization, Dry Needling, and Manual therapy  PLAN FOR NEXT SESSION: Continue to load the shoulder within a pain free range and progress  PROM. Try PAM of [place] joint again and check patient response.   Sudie Bailey SPT   This entire session was performed under direct supervision and direction of a licensed therapist/therapist assistant . I have personally read, edited and approve of the note as written.  Janna Arch, PT, DPT  [facility]  11/29/21, 3:36 PM

## 2021-11-29 NOTE — Therapy (Deleted)
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[place])    Past Surgical History:  Procedure Laterality Date   AUGMENTATION MAMMAPLASTY  1975   AUGMENTATION MAMMAPLASTY  2017   2nd augmentation and lift   BREAST SURGERY     impants replaced with lift   CARPAL TUNNEL RELEASE Right 08/26/2014   Procedure: CARPAL TUNNEL RELEASE;  Surgeon: Earnestine Leys, MD;  Location: [facility] ORS;  Service: Orthopedics;  Laterality: Right;   COLONOSCOPY  2005   Dr Bary Castilla   COLONOSCOPY N/A 06/24/2014   Procedure: COLONOSCOPY;  Surgeon: Robert Bellow, MD;  Location: [facility] ENDOSCOPY;  Service: Endoscopy;  Laterality: N/A;   PARATHYROIDECTOMY  1992   PLACEMENT OF BREAST IMPLANTS  1975   TONSILECTOMY, ADENOIDECTOMY, BILATERAL MYRINGOTOMY AND TUBES     Patient Active Problem List   Diagnosis Date Noted   Acute bacterial sinusitis 11/18/2021   Premature ventricular contractions 08/09/2020   Heart palpitations 07/06/2020   Pure hypercholesterolemia 07/06/2020   Vaginal atrophy 08/16/2018   Rectocele 08/16/2018   Osteoarthritis of knee 10/10/2017   Scoliosis of lumbar spine 07/06/2016   Degeneration of lumbar intervertebral disc 07/06/2016   Arthralgia of multiple joints 11/12/2014   Allergic rhinitis 11/12/2014   Diabetes mellitus ([place]) 11/12/2014   Essential (primary) hypertension 11/12/2014   Personal history of other drug therapy 11/12/2014   HLD (hyperlipidemia)  11/12/2014   Adult hypothyroidism 11/12/2014   Mild major depression ([place]) 11/12/2014   Overweight 11/12/2014   Restless leg 11/12/2014   Encounter for screening colonoscopy 04/17/2014   Type 2 diabetes mellitus ([place]) 12/23/2013   PCP: Jerrol Banana., MD  REFERRING PROVIDER: Earnestine Leys, MD  REFERRING DIAG: [phone] (ICD-10-CM) - Impingement syndrome of left shoulder   THERAPY DIAG:  No diagnosis found.  Rationale for Evaluation and Treatment: Rehabilitation  ONSET DATE: 3-5 years  SUBJECTIVE:  SUBJECTIVE STATEMENT:  Patient reports she aggravated her shoulder 3 days ago doing her HEP. The patient performed shoulder extension isometrics at 60 deg of shoulder abduction instead of at her side as she was instructed and felt a sharp pain in the anterior aspect of her (L) shoulder. Patient reports her current pain level around a 2/10 and at its worst a 6/10 since the aggravation. The patient reports movements that are causing her pain include turning her steering wheel, overhead movements, and putting clothes on. Patient reports she has been taking celebrex and tylenol for the pain.   PERTINENT HISTORY:  Pt has been having pain in the L shoulder anywhere from 3-5 years.  She states no mechanism of injury that she's aware of, but she had pain following a PT session at [facility].  She states she has been having pain since that point.  She states she received an MRI recently, but did not note any tears.  EDIT: at conclusion of evaluation, pt notes there were microtears of the RTC.  Unable to pull up imaging at this time.  PAIN:  Are you having pain? Yes: NPRS scale: 3/10 Pain location: L Deltoid and Biceps Region Pain description: Sore, Tender, Dull Aching, Sharp at night sometimes Aggravating  factors: Doing planks or lifting mattress to make bed Relieving factors: Rest, Ice, Heat, Medications Worst Pain: 7/10 Best Pain: 2/10  PRECAUTIONS: None  WEIGHT BEARING RESTRICTIONS: No  FALLS:  Has patient fallen in last 6 months? No  LIVING ENVIRONMENT: Lives with: lives alone Lives in: House/apartment Stairs: Yes: External: 1 steps; none Has following equipment at home: None  OCCUPATION: Retired - Geologist, engineering  PLOF: Independent  PATIENT GOALS:  Relieve the pain so she does not have to decide whether to choose between high BP or pain medication.  NEXT MD VISIT: end of this month.  OBJECTIVE:   DIAGNOSTIC FINDINGS:  Unable to see MRI from [facility].  BP taken in sitting on R UE: 124/64 HR: 74  PATIENT SURVEYS:  Quick Dash 31.82 and FOTO 52/61  COGNITION: Overall cognitive status: Within functional limits for tasks assessed     SENSATION: WFL  POSTURE: Pt with fair posture in sitting.  Pt tends to sacral sit in chair.  UPPER EXTREMITY ROM:   Active ROM Right eval Left eval  Shoulder flexion 151 132  Shoulder extension    Shoulder abduction 148 123*  Shoulder adduction    Shoulder internal rotation    Shoulder external rotation    Elbow flexion    Elbow extension    Wrist flexion    Wrist extension    Wrist ulnar deviation    Wrist radial deviation    Wrist pronation    Wrist supination    (Blank rows = not tested)  UPPER EXTREMITY MMT:  MMT Right eval Left eval  Shoulder flexion 5 4-*  Shoulder extension    Shoulder abduction 5 4-*  Shoulder adduction    Shoulder internal rotation    Shoulder external rotation    Middle trapezius    Lower trapezius    Elbow flexion 5 5  Elbow extension 5 5  Wrist flexion    Wrist extension    Wrist ulnar deviation    Wrist radial deviation    Wrist pronation    Wrist supination    Grip strength (lbs)    (Blank rows = not tested)  SHOULDER SPECIAL TESTS: Impingement  tests: Neer impingement test: positive , Hawkins/Kennedy impingement test:  positive , Painful arc test: positive , and O' Briens test: positive  Rotator cuff assessment: Empty can test: positive , Full can test: positive , External rotation lag sign: positive , and Belly press test: positive   JOINT MOBILITY TESTING:  Not assessed at this time.  PALPATION:  Tender to palpation along the L acromion.   TODAY'S TREATMENT:                                                                                                                                         DATE: 11/29/21   Seated Vitals:  BP - 134/71 Pulse - 74  Manual Therapy: (L) shoulder  - PROM Flexion 5x10 second hold before pain, pain felt at end range - PROM Abduction 5x10 second hold before pain, pain felt at end range  [place] PAM:  - AP glide Grade 1-2, 1 minute - PA glide Grade 1-2, 1 minute  - Inferior glide Grade 1-2, 1 minute  - GH distraction 3x20 seconds  (Patient reported relief in shoulder pain with all [place] PAM today)   There Ex: Manual Isometrics: (Manual resistance applied from PT)  - Flexion 5x10 seconds - Extension 5x10 seconds - ER 5x10 seconds  - IR 5x10 seconds  - ADD 5x10 seconds - ABD 5x10 seconds   *Patient was educated on all HEP exercises to confirm understanding and prevent any more aggravation with HEP* (5x5 seconds for all)   (Patient couldn't complete TRX assisted shoulder abduction without further aggravation of shoulder)   - Shoulder Flexion with dowel 10x - Shoulder Abduction with dowel 10x  Manual Hot Pack 5 minutes at end of session   PATIENT EDUCATION: Education details: Pt educated on role of PT and services provided during current POC, along with prognosis and information about the clinic.  Person educated: Patient Education method: Explanation and Verbal cues Education comprehension: verbalized understanding  HOME EXERCISE PROGRAM: Access Code: [facility] URL:  [email or web address] Date: 11/29/2021 Prepared by: Janna Arch  Exercises - Seated Scapular Retraction  - 1 x daily - 7 x weekly - 2 sets - 10 reps - 5 hold - Isometric Shoulder Abduction at Wall  - 1 x daily - 7 x weekly - 2 sets - 10 reps - 5 hold - Isometric Shoulder External Rotation at Wall  - 1 x daily - 7 x weekly - 2 sets - 10 reps - 5 hold - Standing Isometric Shoulder Internal Rotation at Doorway  - 1 x daily - 7 x weekly - 2 sets - 10 reps - 5 hold - Isometric Shoulder Flexion at Wall  - 1 x daily - 7 x weekly - 2 sets - 10 reps - 5 hold - Isometric Shoulder Extension at Wall  - 1 x daily - 7 x weekly - 2 sets - 10 reps - 5 hold  ASSESSMENT:  CLINICAL IMPRESSION:  The patient presented to therapy  today with some aggravation in her (L) shoulder. The patient aggravated her shoulder while performing her HEP improperly, the patient was educated on proper technique and demonstrated her understanding of the HEP following practice with SPT. The goal of therapy today was pain reduction and management. The patient responded great to manual therapy of the (L) shoulder and reported a decrease in pain following. The patient responded well to [facility] distraction, [place] PAM, and isometric exercises to load the RTC tendons. PROM and therapeutic exercise was keep within a pain free range for today's session to prevent further aggravation. Pt will continue benefit from skilled therapy to address tolerance, ROM, pain, and strength impairments necessary for improvement in quality of life.    OBJECTIVE IMPAIRMENTS: decreased ROM, decreased strength, impaired UE functional use, and pain.   ACTIVITY LIMITATIONS: carrying, lifting, bathing, toileting, dressing, reach over head, and hygiene/grooming  PARTICIPATION LIMITATIONS: cleaning, driving, and yard work  PERSONAL FACTORS: Age, Past/current experiences, Time since onset of injury/illness/exacerbation, and 3+ comorbidities: DM2, OA, depression   are also affecting patient's functional outcome.   REHAB POTENTIAL: Good  CLINICAL DECISION MAKING: Stable/uncomplicated  EVALUATION COMPLEXITY: Moderate   GOALS: Goals reviewed with patient? Yes  SHORT TERM GOALS: Target date: 12/27/2021  (Remove Blue Hyperlink)  Pt will be independent with HEP in order to demonstrate increased ability to perform tasks related to occupation/hobbies. Baseline:  Shoulder isometrics given at eval Goal status: INITIAL   LONG TERM GOALS: Target date: 02/21/2022  (Remove Blue Hyperlink)  Pt. will improve the QuickDASH by 10 points in order to demonstrate increased functional independence with decreased pain. Baseline: 31.82 Goal status: INITIAL  2.  Patient will demonstrate improved function as evidenced by a score of 61 on FOTO measure for full participation in activities at home and in the community. Baseline: 52 Goal status: INITIAL  3.  Pt will increase ROM of L to be within 10% of R (Shoulder flex/abd: > 136 deg/ >133 deg), in order to perform adequate ROM necessary for ADL's and to gain independence.  Baseline: L Shoulder Flex/Abd: 132/123 Goal status: INITIAL  PLAN:  PT FREQUENCY: 2x/week  PT DURATION: 12 weeks  PLANNED INTERVENTIONS: Therapeutic exercises, Therapeutic activity, Neuromuscular re-education, Balance training, Gait training, Patient/Family education, Self Care, Joint mobilization, Dry Needling, and Manual therapy  PLAN FOR NEXT SESSION: Continue to load the shoulder within a pain free range and progress PROM. Try PAM of [place] joint again and check patient response.   Sudie Bailey SPT   This entire session was performed under direct supervision and direction of a licensed therapist/therapist assistant . I have personally read, edited and approve of the note as written.  Janna Arch, PT, DPT  [facility]  11/29/21, 2:01 PM

## 2021-12-05 ENCOUNTER — Telehealth: Payer: Self-pay | Admitting: Family Medicine

## 2021-12-05 DIAGNOSIS — E039 Hypothyroidism, unspecified: Secondary | ICD-10-CM

## 2021-12-05 MED ORDER — LEVOTHYROXINE SODIUM 50 MCG PO TABS: 50.0000 ug | ORAL_TABLET | Freq: Every day | ORAL | 0 refills | Status: DC

## 2021-12-05 NOTE — Telephone Encounter (Signed)
[facility] pharmacy faxed refill request for the following medications:    levothyroxine (SYNTHROID) 50 MCG tablet   Please advise

## 2021-12-06 DIAGNOSIS — H6063 Unspecified chronic otitis externa, bilateral: Secondary | ICD-10-CM | POA: Diagnosis not present

## 2021-12-06 DIAGNOSIS — J0100 Acute maxillary sinusitis, unspecified: Secondary | ICD-10-CM | POA: Diagnosis not present

## 2021-12-07 NOTE — Therapy (Signed)
OUTPATIENT PHYSICAL THERAPY SHOULDER TREATMENT   Patient Name: Nicole Young MRN: [id number] DOB:05-Mar-1940, 81 y.o., female Today's Date: 12/08/2021  PT End of Session - 12/08/21 1448     Visit Number 4    Number of Visits 24    Date for PT Re-Evaluation 02/07/22    Authorization Type IE: 08/03/21; RC: 11/01/21    Progress Note Due on Visit 10    PT Start Time 6195    PT Stop Time 1430    PT Time Calculation (min) 45 min    Activity Tolerance Patient tolerated treatment well;Patient limited by pain    Behavior During Therapy [facility] for tasks assessed/performed            Past Medical History:  Diagnosis Date   AMD (acid maltase deficiency) (HCC)    Complication of anesthesia    slow to wake up in past   Depression    controlled   Diabetes mellitus without complication ([place])    controlled   Hyperlipidemia    Hypothyroidism    Neuropathy    Osteoarthritis    knee, ankles, shoulders, back, neck, hands/fingers, wrists;    Rectocele    Thyroid disease    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[place])    Past Surgical History:  Procedure Laterality Date   AUGMENTATION MAMMAPLASTY  1975   AUGMENTATION MAMMAPLASTY  2017   2nd augmentation and lift   BREAST SURGERY     impants replaced with lift   CARPAL TUNNEL RELEASE Right 08/26/2014   Procedure: CARPAL TUNNEL RELEASE;  Surgeon: Earnestine Leys, MD;  Location: [facility] ORS;  Service: Orthopedics;  Laterality: Right;   COLONOSCOPY  2005   Dr Bary Castilla   COLONOSCOPY N/A 06/24/2014   Procedure: COLONOSCOPY;  Surgeon: Robert Bellow, MD;  Location: [facility] ENDOSCOPY;  Service: Endoscopy;  Laterality: N/A;   PARATHYROIDECTOMY  1992   PLACEMENT OF BREAST IMPLANTS  1975   TONSILECTOMY, ADENOIDECTOMY, BILATERAL MYRINGOTOMY AND TUBES     Patient Active Problem List   Diagnosis Date Noted   Acute bacterial sinusitis 11/18/2021   Premature ventricular contractions 08/09/2020   Heart palpitations 07/06/2020   Pure hypercholesterolemia  07/06/2020   Vaginal atrophy 08/16/2018   Rectocele 08/16/2018   Osteoarthritis of knee 10/10/2017   Scoliosis of lumbar spine 07/06/2016   Degeneration of lumbar intervertebral disc 07/06/2016   Arthralgia of multiple joints 11/12/2014   Allergic rhinitis 11/12/2014   Diabetes mellitus ([place]) 11/12/2014   Essential (primary) hypertension 11/12/2014   Personal history of other drug therapy 11/12/2014   HLD (hyperlipidemia) 11/12/2014   Adult hypothyroidism 11/12/2014   Mild major depression ([place]) 11/12/2014   Overweight 11/12/2014   Restless leg 11/12/2014   Encounter for screening colonoscopy 04/17/2014   Type 2 diabetes mellitus ([place]) 12/23/2013   PCP: Jerrol Banana., MD  REFERRING PROVIDER: Earnestine Leys, MD  REFERRING DIAG: [phone] (ICD-10-CM) - Impingement syndrome of left shoulder   THERAPY DIAG:  Muscle weakness (generalized)  Weakness of shoulder  Acute pain of left shoulder  Rationale for Evaluation and Treatment: Rehabilitation  ONSET DATE: 3-5 years  SUBJECTIVE:  SUBJECTIVE STATEMENT:  The patient reports they are still aggravated in their (L) shoulder and reports some of the HEP exercises (Isometric ER, ABD, and IR) have been causing the patient shoulder pain. Patient reports pain currently at a 0/10, at worst this past week around a 8/10 on the NPRS. Patient reports they are going to see their doctor again on Dec, 7th.   PERTINENT HISTORY:  Pt has been having pain in the L shoulder anywhere from 3-5 years.  She states no mechanism of injury that she's aware of, but she had pain following a PT session at [facility].  She states she has been having pain since that point.  She states she received an MRI recently, but did not note any tears.  EDIT: at conclusion of evaluation,  pt notes there were microtears of the RTC.  Unable to pull up imaging at this time.  PAIN:  Are you having pain? Yes: NPRS scale: 3/10 Pain location: L Deltoid and Biceps Region Pain description: Sore, Tender, Dull Aching, Sharp at night sometimes Aggravating factors: Doing planks or lifting mattress to make bed Relieving factors: Rest, Ice, Heat, Medications Worst Pain: 7/10 Best Pain: 2/10  PRECAUTIONS: None  WEIGHT BEARING RESTRICTIONS: No  FALLS:  Has patient fallen in last 6 months? No  LIVING ENVIRONMENT: Lives with: lives alone Lives in: House/apartment Stairs: Yes: External: 1 steps; none Has following equipment at home: None  OCCUPATION: Retired - Geologist, engineering  PLOF: Independent  PATIENT GOALS:  Relieve the pain so she does not have to decide whether to choose between high BP or pain medication.  NEXT MD VISIT: end of this month.  OBJECTIVE:   DIAGNOSTIC FINDINGS:  Unable to see MRI from [facility].  BP taken in sitting on R UE: 124/64 HR: 74  PATIENT SURVEYS:  Quick Dash 31.82 and FOTO 52/61  COGNITION: Overall cognitive status: Within functional limits for tasks assessed     SENSATION: WFL  POSTURE: Pt with fair posture in sitting.  Pt tends to sacral sit in chair.  UPPER EXTREMITY ROM:   Active ROM Right eval Left eval  Shoulder flexion 151 132  Shoulder extension    Shoulder abduction 148 123*  Shoulder adduction    Shoulder internal rotation    Shoulder external rotation    Elbow flexion    Elbow extension    Wrist flexion    Wrist extension    Wrist ulnar deviation    Wrist radial deviation    Wrist pronation    Wrist supination    (Blank rows = not tested)  UPPER EXTREMITY MMT:  MMT Right eval Left eval  Shoulder flexion 5 4-*  Shoulder extension    Shoulder abduction 5 4-*  Shoulder adduction    Shoulder internal rotation    Shoulder external rotation    Middle trapezius    Lower trapezius     Elbow flexion 5 5  Elbow extension 5 5  Wrist flexion    Wrist extension    Wrist ulnar deviation    Wrist radial deviation    Wrist pronation    Wrist supination    Grip strength (lbs)    (Blank rows = not tested)  SHOULDER SPECIAL TESTS: Impingement tests: Neer impingement test: positive , Hawkins/Kennedy impingement test: positive , Painful arc test: positive , and O' Briens test: positive  Rotator cuff assessment: Empty can test: positive , Full can test: positive , External rotation lag sign: positive , and Belly press  test: positive   JOINT MOBILITY TESTING:  Not assessed at this time.  PALPATION:  Tender to palpation along the L acromion.   TODAY'S TREATMENT:                                                                                                                                         DATE: 12/08/21   Manual Therapy: (L) shoulder  - PROM flexion 5x10 second hold before pain, pain felt at end range - PROM ER and IR 5x10 second hold before pain, pain felt at end range - PROM Abduction 5x10 second hold before pain, pain felt at end range - PROM Horizontal Adductions 5x10 second hold, pain felt at end range    [place] PAM:  - AP glide Grade 1-2  - Inferior glide Grade 1-2 - GH distraction (Patient reported relief in shoulder pain with distraction) 3x20 seconds   There Ex: (L shoulder only) Manual Isometrics: (Manual resistance applied from PT)  - ER 3x10 seconds  - IR 3x10 seconds  - FLX and EXT 3x10 seconds - Isometrics ADD 3x10 seconds - Isometrics ABD 3x10 seconds    - YTB shoulder ER 10x (in pain free range) - YTB shoulder IR 10x (in pain free range)  - Shoulder flexion ladder walks 5x 10 seconds hold at end range  - Shoulder Abduction ladder walk 5x 10 second hold at end range   - UE ranger shoulder FLX 10x (in pain free range) - UE ranger shoulder ABD 10x (in pain free range)  Manual Hot Pack 8 minutes at end of session   PATIENT  EDUCATION: Education details: Pt educated on role of PT and services provided during current POC, along with prognosis and information about the clinic.  Person educated: Patient Education method: Explanation and Verbal cues Education comprehension: verbalized understanding  HOME EXERCISE PROGRAM: Access Code: [facility] URL: [email or web address] Date: 11/29/2021 Prepared by: Janna Arch  Exercises - Seated Scapular Retraction  - 1 x daily - 7 x weekly - 2 sets - 10 reps - 5 hold - Isometric Shoulder Abduction at Wall  - 1 x daily - 7 x weekly - 2 sets - 10 reps - 5 hold - Isometric Shoulder External Rotation at Wall  - 1 x daily - 7 x weekly - 2 sets - 10 reps - 5 hold - Standing Isometric Shoulder Internal Rotation at Doorway  - 1 x daily - 7 x weekly - 2 sets - 10 reps - 5 hold - Isometric Shoulder Flexion at Wall  - 1 x daily - 7 x weekly - 2 sets - 10 reps - 5 hold - Isometric Shoulder Extension at Wall  - 1 x daily - 7 x weekly - 2 sets - 10 reps - 5 hold  ASSESSMENT:  CLINICAL IMPRESSION:  The patient presented to therapy today with aggravation in her (L) shoulder again. The patient was  asked to demonstrate how she was performing her HEP isometric exercises and she demonstrated them differently from how she was taught last session to do them. The patient was educated on how to properly perform the exercises again during today session and was educated to decrease force of isometrics and duration if it was causing her further aggravation. Therapeutic exercise was limited today due to the patient's aggravated state and the goal of therapy today was pain reduction and management. The patient responded well to manual therapy of the (L) shoulder and reported a decrease in pain following. The patient responded well to [facility] distraction, [place] PAM, and isometric exercises to load the RTC tendons. PROM and therapeutic exercise was keep within a pain free range for today's session to  prevent further aggravation. Pt will continue benefit from skilled therapy to address tolerance, ROM, pain, and strength impairments necessary for improvement in quality of life.    OBJECTIVE IMPAIRMENTS: decreased ROM, decreased strength, impaired UE functional use, and pain.   ACTIVITY LIMITATIONS: carrying, lifting, bathing, toileting, dressing, reach over head, and hygiene/grooming  PARTICIPATION LIMITATIONS: cleaning, driving, and yard work  PERSONAL FACTORS: Age, Past/current experiences, Time since onset of injury/illness/exacerbation, and 3+ comorbidities: DM2, OA, depression  are also affecting patient's functional outcome.   REHAB POTENTIAL: Good  CLINICAL DECISION MAKING: Stable/uncomplicated  EVALUATION COMPLEXITY: Moderate   GOALS: Goals reviewed with patient? Yes  SHORT TERM GOALS: Target date: 01/05/2022  (Remove Blue Hyperlink)  Pt will be independent with HEP in order to demonstrate increased ability to perform tasks related to occupation/hobbies. Baseline:  Shoulder isometrics given at eval Goal status: INITIAL   LONG TERM GOALS: Target date: 03/02/2022  (Remove Blue Hyperlink)  Pt. will improve the QuickDASH by 10 points in order to demonstrate increased functional independence with decreased pain. Baseline: 31.82 Goal status: INITIAL  2.  Patient will demonstrate improved function as evidenced by a score of 61 on FOTO measure for full participation in activities at home and in the community. Baseline: 52 Goal status: INITIAL  3.  Pt will increase ROM of L to be within 10% of R (Shoulder flex/abd: > 136 deg/ >133 deg), in order to perform adequate ROM necessary for ADL's and to gain independence.  Baseline: L Shoulder Flex/Abd: 132/123 Goal status: INITIAL  PLAN:  PT FREQUENCY: 2x/week  PT DURATION: 12 weeks  PLANNED INTERVENTIONS: Therapeutic exercises, Therapeutic activity, Neuromuscular re-education, Balance training, Gait training, Patient/Family  education, Self Care, Joint mobilization, Dry Needling, and Manual therapy  PLAN FOR NEXT SESSION: Continue to load the shoulder within a pain free range and progress PROM. Try PAM of [place] joint again and check patient response.   Sudie Bailey SPT   This entire session was performed under direct supervision and direction of a licensed therapist/therapist assistant . I have personally read, edited and approve of the note as written.  Janna Arch, PT, DPT  [facility]  12/08/21, 2:51 PM

## 2021-12-08 ENCOUNTER — Ambulatory Visit: Payer: Medicare Other

## 2021-12-08 DIAGNOSIS — M25512 Pain in left shoulder: Secondary | ICD-10-CM

## 2021-12-08 DIAGNOSIS — R29898 Other symptoms and signs involving the musculoskeletal system: Secondary | ICD-10-CM

## 2021-12-08 DIAGNOSIS — M25612 Stiffness of left shoulder, not elsewhere classified: Secondary | ICD-10-CM | POA: Diagnosis not present

## 2021-12-08 DIAGNOSIS — M6281 Muscle weakness (generalized): Secondary | ICD-10-CM | POA: Diagnosis not present

## 2021-12-08 DIAGNOSIS — N8189 Other female genital prolapse: Secondary | ICD-10-CM | POA: Diagnosis not present

## 2021-12-13 ENCOUNTER — Ambulatory Visit: Payer: Medicare Other

## 2021-12-15 DIAGNOSIS — H353221 Exudative age-related macular degeneration, left eye, with active choroidal neovascularization: Secondary | ICD-10-CM | POA: Diagnosis not present

## 2021-12-16 DIAGNOSIS — M7542 Impingement syndrome of left shoulder: Secondary | ICD-10-CM | POA: Diagnosis not present

## 2021-12-20 ENCOUNTER — Ambulatory Visit: Payer: Medicare Other

## 2021-12-29 ENCOUNTER — Ambulatory Visit: Payer: Medicare Other

## 2022-01-13 ENCOUNTER — Ambulatory Visit
Admission: EM | Admit: 2022-01-13 | Discharge: 2022-01-13 | Disposition: A | Discharge status: Home / Self Care | Payer: Medicare Other

## 2022-01-13 DIAGNOSIS — M542 Cervicalgia: Secondary | ICD-10-CM | POA: Diagnosis not present

## 2022-01-13 DIAGNOSIS — E1142 Type 2 diabetes mellitus with diabetic polyneuropathy: Secondary | ICD-10-CM | POA: Diagnosis not present

## 2022-01-13 DIAGNOSIS — E039 Hypothyroidism, unspecified: Secondary | ICD-10-CM | POA: Diagnosis not present

## 2022-01-13 NOTE — ED Provider Notes (Signed)
[name]    CSN: [id number] Arrival date & time: 01/13/22  1003      History   Chief Complaint Chief Complaint  Patient presents with   Neck Pain   Headache    HPI WYVONNE CARDA is a 82 y.o. female.    Neck Pain Associated symptoms: headaches   Headache Associated symptoms: neck pain     Patient presents to urgent care with complaint of headache and neck pain x 1 week.  She endorses no known injury or precipitating event.  Patient's chart indicates history of chronic neck pain consistent with DDD, evaluated at multiple office visits in 2018.  PMH of DM2, well controlled.  She states her family urged her to come in to be seen because they had a concerned she had influenza.  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[facility])     Patient Active Problem List   Diagnosis Date Noted   Acute bacterial sinusitis 11/18/2021   Premature ventricular contractions 08/09/2020   Heart palpitations 07/06/2020   Pure hypercholesterolemia 07/06/2020   Vaginal atrophy 08/16/2018   Rectocele 08/16/2018   Osteoarthritis of knee 10/10/2017   Scoliosis of lumbar spine 07/06/2016   Degeneration of lumbar intervertebral disc 07/06/2016   Arthralgia of multiple joints 11/12/2014   Allergic rhinitis 11/12/2014   Diabetes mellitus ([place]) 11/12/2014   Essential (primary) hypertension 11/12/2014   Personal history of other drug therapy 11/12/2014   HLD (hyperlipidemia) 11/12/2014   Adult hypothyroidism 11/12/2014   Mild major depression ([place]) 11/12/2014   Overweight 11/12/2014   Restless leg  11/12/2014   Encounter for screening colonoscopy 04/17/2014   Type 2 diabetes mellitus ([place]) 12/23/2013    Past Surgical History:  Procedure Laterality Date   AUGMENTATION MAMMAPLASTY  1975   AUGMENTATION MAMMAPLASTY  2017   2nd augmentation and lift   BREAST SURGERY     impants replaced with lift   CARPAL TUNNEL RELEASE Right 08/26/2014   Procedure: CARPAL TUNNEL RELEASE;  Surgeon: Earnestine Leys, MD;  Location: [facility] ORS;  Service: Orthopedics;  Laterality: Right;   COLONOSCOPY  2005   Dr Bary Castilla   COLONOSCOPY N/A 06/24/2014   Procedure: COLONOSCOPY;  Surgeon: Robert Bellow, MD;  Location: [facility] ENDOSCOPY;  Service: Endoscopy;  Laterality: N/A;   PARATHYROIDECTOMY  1992   PLACEMENT OF BREAST IMPLANTS  1975   TONSILECTOMY, ADENOIDECTOMY, BILATERAL MYRINGOTOMY AND TUBES      OB History     Gravida  4   Para  3   Term      Preterm      AB  1   Living         SAB  1   IAB      Ectopic      Multiple      Live Births           Obstetric Comments  1st Menstrual Cycle:  12  1st Pregnancy: 22          Home Medications    Prior  to Admission medications   Medication Sig Start Date End Date Taking? Authorizing Provider  Accu-Chek FastClix Lancets MISC CHECK SUGAR ONCE DAILY. DX: E11.9 09/20/18   Jerrol Banana., MD  ACCU-CHEK SMARTVIEW test strip USE TO CHECK BLOOD SUGAR ONCE A DAY 11/26/20   Jerrol Banana., MD  benzonatate (TESSALON) 100 MG capsule Take 1 capsule (100 mg total) by mouth 3 (three) times daily as needed for cough. 10/25/21   Sharion Balloon, NP  celecoxib (CELEBREX) 200 MG capsule Take 200 mg by mouth daily. Reports taking as needed    [provider]  cholecalciferol (VITAMIN D3) 25 MCG (1000 UT) tablet Take 600 Units by mouth daily.    [provider]  estradiol (ESTRACE) 0.1 MG/GM vaginal cream 1 gram intravaginally at bedtime daily for 2-3 weeks, then decrease to 3 times per week 10/31/21   Bjorn Loser,  MD  guaiFENesin (MUCINEX PO) Take by mouth.    [provider]  guaiFENesin-codeine (CHERATUSSIN AC) 100-10 MG/5ML syrup Take 5 mLs by mouth at bedtime as needed for cough (up to two doses daily). 02/22/21   Mikey Kirschner, PA-C  hydroquinone 4 % cream Apply topically 2 (two) times daily. 12/08/20   Brendolyn Patty, MD  levothyroxine (SYNTHROID) 50 MCG tablet Take 1 tablet (50 mcg total) by mouth daily before breakfast. 12/05/21   Gwyneth Sprout, FNP  Magnesium 250 MG TABS Take 1 tablet by mouth daily.    [provider]  metFORMIN (GLUCOPHAGE-XR) 500 MG 24 hr tablet TAKE 1 TABLET TWICE A DAY Patient taking differently: 500 mg 2 (two) times daily before a meal. 05/26/21   Jerrol Banana., MD  methocarbamol (ROBAXIN) 500 MG tablet as needed.    [provider]  mometasone (ELOCON) 0.1 % cream Apply 1 application topically See admin instructions. Use 1 - 2 times daily until clear, then use as a spot treatment when needed 02/15/21   Brendolyn Patty, MD  Multiple Vitamin (MULTIVITAMIN) capsule Take 1 capsule by mouth daily.    [provider]  Multiple Vitamins-Minerals (PRESERVISION AREDS PO) Take by mouth 2 (two) times daily.    [provider]  pantoprazole (PROTONIX) 40 MG tablet Take 1 tablet (40 mg total) by mouth daily. 10/04/21   Jerrol Banana., MD  predniSONE (DELTASONE) 50 MG tablet Take 1 tablet (50 mg total) by mouth daily with breakfast. 11/18/21   Gwyneth Sprout, FNP  rosuvastatin (CRESTOR) 20 MG tablet TAKE 1 TABLET DAILY 03/30/21   Jerrol Banana., MD  sertraline (ZOLOFT) 100 MG tablet TAKE 1 TABLET DAILY 04/04/21   Jerrol Banana., MD    Family History Family History  Problem Relation Age of Onset   Cancer Father        prostate   Prostate cancer Father    Hypertension Mother    Heart attack Mother    Heart attack Maternal Grandfather    Diabetes Paternal Uncle    Congestive Heart Failure Maternal Grandmother     CVA Paternal Grandmother    Prostate cancer Paternal Grandfather    Breast cancer Maternal Aunt     Social History Social History   Tobacco Use   Smoking status: Never   Smokeless tobacco: Never  Vaping Use   Vaping Use: Never used  Substance Use Topics   Alcohol use: Yes    Alcohol/week: 1.0 standard drink of alcohol    Types: 1 Glasses of wine per week  Drug use: No     Allergies   Neomycin-polymyxin-gramicidin, Farxiga [dapagliflozin], Neosporin [neomycin-bacitracin zn-polymyx], Sulfa antibiotics, Thimerosal (thiomersal), and Zostavax  [zoster vaccine live]   Review of Systems Review of Systems  Musculoskeletal:  Positive for neck pain.  Neurological:  Positive for headaches.     Physical Exam Triage Vital Signs ED Triage Vitals  Enc Vitals Group     BP 01/13/22 1052 (!) 156/81     Pulse Rate 01/13/22 1052 89     Resp 01/13/22 1052 18     Temp 01/13/22 1052 98.5 F (36.9 C)     Temp Source 01/13/22 1052 Oral     SpO2 01/13/22 1052 94 %     Weight --      Height --      Head Circumference --      Peak Flow --      Pain Score 01/13/22 1056 7     Pain Loc --      Pain Edu? --      Excl. in [place]? --    No data found.  Updated Vital Signs BP (!) 156/81 (BP Location: Left Arm)   Pulse 89   Temp 98.5 F (36.9 C) (Oral)   Resp 18   SpO2 94%   Visual Acuity Right Eye Distance:   Left Eye Distance:   Bilateral Distance:    Right Eye Near:   Left Eye Near:    Bilateral Near:     Physical Exam Vitals reviewed.  Constitutional:      Appearance: She is well-developed.  Neurological:     Mental Status: She is alert and oriented to person, place, and time.  Psychiatric:        Mood and Affect: Mood normal.        Behavior: Behavior normal.      UC Treatments / Results  Labs (all labs ordered are listed, but only abnormal results are displayed) Labs Reviewed - No data to display  EKG   Radiology No results found.  Procedures Procedures  (including critical care time)  Medications Ordered in UC Medications - No data to display  Initial Impression / Assessment and Plan / UC Course  I have reviewed the triage vital signs and the nursing notes.  Pertinent labs & imaging results that were available during my care of the patient were reviewed by me and considered in my medical decision making (see chart for details).   Patient is afebrile here without recent antipyretics. Satting well on room air. Overall is well appearing, well hydrated, without respiratory distress.   I shared with the patient again that we were not able to test for influenza if that were truly a concern.  It would be low on my differential as she has no relevant symptoms.  Suspect her neck and head pain is related to her recent trip and aggravation by sleeping on a new bed.  She has history of DDD with recurrent symptoms over the past few years.  Recommended use of conservative treatment including cold, heat, topical creams including menthol, lidocaine, NSAIDs which she states she does not tolerate except for Aleve.  In particular suggested thermacare patches which she can obtain at the pharmacy.  Lastly recommended follow-up with orthopedic/neck doctor to reevaluate her if her symptoms do not improve.    Final Clinical Impressions(s) / UC Diagnoses   Final diagnoses:  None   Discharge Instructions   None    ED Prescriptions   None  PDMP not reviewed this encounter.   Rose Phi, [place] 01/13/22 1121

## 2022-01-13 NOTE — Discharge Instructions (Signed)
Recommend continued use of NSAIDs as tolerated, along with topical therapy as discussed.  Follow-up with a orthopedic/back/neck specialist if your symptoms do not significantly improve within 2 weeks as you may need a reevaluation of your degenerative disc disease affecting the cervical portion of your spine.

## 2022-01-13 NOTE — ED Triage Notes (Signed)
Pt. Presents to UC w/ c/o a headache and neck pain for the past week. Pt. Endorses no known injury or precipitating event.

## 2022-01-16 DIAGNOSIS — H353221 Exudative age-related macular degeneration, left eye, with active choroidal neovascularization: Secondary | ICD-10-CM | POA: Diagnosis not present

## 2022-01-19 DIAGNOSIS — Z23 Encounter for immunization: Secondary | ICD-10-CM | POA: Diagnosis not present

## 2022-01-23 ENCOUNTER — Ambulatory Visit: Payer: Medicare Other | Admitting: Family Medicine

## 2022-01-23 DIAGNOSIS — E1142 Type 2 diabetes mellitus with diabetic polyneuropathy: Secondary | ICD-10-CM | POA: Diagnosis not present

## 2022-01-23 DIAGNOSIS — I10 Essential (primary) hypertension: Secondary | ICD-10-CM | POA: Diagnosis not present

## 2022-01-23 DIAGNOSIS — Z780 Asymptomatic menopausal state: Secondary | ICD-10-CM | POA: Diagnosis not present

## 2022-01-23 DIAGNOSIS — M8588 Other specified disorders of bone density and structure, other site: Secondary | ICD-10-CM | POA: Diagnosis not present

## 2022-01-23 DIAGNOSIS — E039 Hypothyroidism, unspecified: Secondary | ICD-10-CM | POA: Diagnosis not present

## 2022-01-23 DIAGNOSIS — E1165 Type 2 diabetes mellitus with hyperglycemia: Secondary | ICD-10-CM | POA: Diagnosis not present

## 2022-02-10 DIAGNOSIS — D1720 Benign lipomatous neoplasm of skin and subcutaneous tissue of unspecified limb: Secondary | ICD-10-CM

## 2022-02-10 DIAGNOSIS — I10 Essential (primary) hypertension: Secondary | ICD-10-CM | POA: Diagnosis not present

## 2022-02-10 DIAGNOSIS — R0600 Dyspnea, unspecified: Secondary | ICD-10-CM | POA: Diagnosis not present

## 2022-02-10 DIAGNOSIS — M7070 Other bursitis of hip, unspecified hip: Secondary | ICD-10-CM

## 2022-02-10 DIAGNOSIS — M25569 Pain in unspecified knee: Secondary | ICD-10-CM

## 2022-02-10 DIAGNOSIS — R0609 Other forms of dyspnea: Secondary | ICD-10-CM | POA: Diagnosis not present

## 2022-02-10 DIAGNOSIS — F320 Major depressive disorder, single episode, mild: Secondary | ICD-10-CM | POA: Diagnosis not present

## 2022-02-10 DIAGNOSIS — E7800 Pure hypercholesterolemia, unspecified: Secondary | ICD-10-CM | POA: Diagnosis not present

## 2022-02-10 DIAGNOSIS — Z79899 Other long term (current) drug therapy: Secondary | ICD-10-CM | POA: Diagnosis not present

## 2022-02-10 DIAGNOSIS — E119 Type 2 diabetes mellitus without complications: Secondary | ICD-10-CM | POA: Diagnosis not present

## 2022-02-10 DIAGNOSIS — R053 Chronic cough: Secondary | ICD-10-CM | POA: Diagnosis not present

## 2022-02-10 DIAGNOSIS — R5382 Chronic fatigue, unspecified: Secondary | ICD-10-CM | POA: Diagnosis not present

## 2022-02-10 HISTORY — DX: Other bursitis of hip, unspecified hip: M70.70

## 2022-02-10 HISTORY — DX: Benign lipomatous neoplasm of skin and subcutaneous tissue of unspecified limb: D17.20

## 2022-02-10 HISTORY — DX: Pain in unspecified knee: M25.569

## 2022-03-16 DIAGNOSIS — I10 Essential (primary) hypertension: Secondary | ICD-10-CM | POA: Diagnosis not present

## 2022-03-16 DIAGNOSIS — E7800 Pure hypercholesterolemia, unspecified: Secondary | ICD-10-CM | POA: Diagnosis not present

## 2022-03-16 DIAGNOSIS — R002 Palpitations: Secondary | ICD-10-CM | POA: Diagnosis not present

## 2022-03-16 DIAGNOSIS — E119 Type 2 diabetes mellitus without complications: Secondary | ICD-10-CM | POA: Diagnosis not present

## 2022-03-16 DIAGNOSIS — I493 Ventricular premature depolarization: Secondary | ICD-10-CM | POA: Diagnosis not present

## 2022-03-18 DIAGNOSIS — G4733 Obstructive sleep apnea (adult) (pediatric): Secondary | ICD-10-CM | POA: Diagnosis not present

## 2022-03-30 ENCOUNTER — Ambulatory Visit (INDEPENDENT_AMBULATORY_CARE_PROVIDER_SITE_OTHER): Payer: Medicare Other | Admitting: Dermatology

## 2022-03-30 VITALS — BP 129/72 | HR 88

## 2022-03-30 DIAGNOSIS — L578 Other skin changes due to chronic exposure to nonionizing radiation: Secondary | ICD-10-CM | POA: Diagnosis not present

## 2022-03-30 DIAGNOSIS — B078 Other viral warts: Secondary | ICD-10-CM | POA: Diagnosis not present

## 2022-03-30 DIAGNOSIS — L820 Inflamed seborrheic keratosis: Secondary | ICD-10-CM

## 2022-03-30 DIAGNOSIS — D229 Melanocytic nevi, unspecified: Secondary | ICD-10-CM | POA: Diagnosis not present

## 2022-03-30 DIAGNOSIS — L814 Other melanin hyperpigmentation: Secondary | ICD-10-CM

## 2022-03-30 DIAGNOSIS — L821 Other seborrheic keratosis: Secondary | ICD-10-CM

## 2022-03-30 DIAGNOSIS — Z1283 Encounter for screening for malignant neoplasm of skin: Secondary | ICD-10-CM | POA: Diagnosis not present

## 2022-03-30 MED ORDER — FLUOROURACIL 5 % EX CREA: TOPICAL_CREAM | CUTANEOUS | 0 refills | Status: DC

## 2022-03-30 NOTE — Patient Instructions (Addendum)
[place], [address], [place]  [address] Phone: [phone]    For wart on the right thumb Start  Fluorouracil 5% cream apply to wart at night followed by over the counter  Salicylic aid and cover with a band aid    Cryotherapy Aftercare  Wash gently with soap and water everyday.   Apply Vaseline and Band-Aid daily until healed.     Recommend daily broad spectrum sunscreen SPF 30+ to sun-exposed areas, reapply every 2 hours as needed. Call for new or changing lesions.  Staying in the shade or wearing long sleeves, sun glasses (UVA+UVB protection) and wide brim hats (4-inch brim around the entire circumference of the hat) are also recommended for sun protection.      Recommend taking Heliocare sun protection supplement daily in sunny weather for additional sun protection. For maximum protection on the sunniest days, you can take up to 2 capsules of regular Heliocare OR take 1 capsule of Heliocare Ultra. For prolonged exposure (such as a full day in the sun), you can repeat your dose of the supplement 4 hours after your first dose. Heliocare can be purchased at [employer], at some [facility] or at [email or web address].       Recommend OTC Gold Bond Rapid Relief Anti-Itch cream (pramoxine + menthol), CeraVe Anti-itch cream or lotion (pramoxine), Sarna lotion (Original- menthol + camphor or Sensitive- pramoxine) or Eucerin 12 hour Itch Relief lotion (menthol) up to 3 times per day to areas on body that are itchy.     Gentle Skin Care Guide  1. Bathe no more than once a day.  2. Avoid bathing in hot water  3. Use a mild soap like Dove for sensitive skin.   4. Use soap only where you need it. On most days, use it under your arms, between your legs, and on your feet. Let the water rinse other areas unless visibly dirty.  5. When you get out of the bath/shower, use a towel to gently blot your skin dry, don't rub it.  6.  While your skin is still a little damp, apply a moisturizing cream such as Vanicream, CeraVe, Cetaphil, Eucerin, Sarna lotion or plain Vaseline Jelly. For hands apply Neutrogena [place] Hand Cream or Excipial Hand Cream.  7. Reapply moisturizer any time you start to itch or feel dry.  8. Sometimes using free and clear laundry detergents can be helpful. Fabric softener sheets should be avoided. Downy Free & Gentle liquid, or any liquid fabric softener that is free of dyes and perfumes, it acceptable to use  9. If your doctor has given you prescription creams you may apply moisturizers over them         Due to recent changes in healthcare laws, you may see results of your pathology and/or laboratory studies on MyChart before the doctors have had a chance to review them. We understand that in some cases there may be results that are confusing or concerning to you. Please understand that not all results are received at the same time and often the doctors may need to interpret multiple results in order to provide you with the best plan of care or course of treatment. Therefore, we ask that you please give [place] 2 business days to thoroughly review all your results before contacting the office for clarification. Should we see a critical lab result, you will be contacted sooner.   If You Need Anything After Your Visit  If you have any questions or concerns for your doctor, please call our main line at [phone] and press option 4 to reach your doctor's medical assistant. If no one answers, please leave a voicemail as directed and we will return your call as soon as possible. Messages left after 4 pm will be answered the following business day.   You may also send [place] a message via [place]. We typically respond to MyChart messages within 1-2 business days.  For prescription refills, please ask your pharmacy to contact our office. Our fax number is [phone].  If you have an urgent issue when the  clinic is closed that cannot wait until the next business day, you can page your doctor at the number below.    Please note that while we do our best to be available for urgent issues outside of office hours, we are not available 24/7.   If you have an urgent issue and are unable to reach [place], you may choose to seek medical care at your doctor's office, retail clinic, urgent care center, or emergency room.  If you have a medical emergency, please immediately call 911 or go to the emergency department.  Pager Numbers  - Dr. Nehemiah Massed: [phone]  - Dr. Laurence Ferrari: [phone]  - Dr. Nicole Kindred: [phone]  In the event of inclement weather, please call our main line at [phone] for an update on the status of any delays or closures.  Dermatology Medication Tips: Please keep the boxes that topical medications come in in order to help keep track of the instructions about where and how to use these. Pharmacies typically print the medication instructions only on the boxes and not directly on the medication tubes.   If your medication is too expensive, please contact our office at [phone] option 4 or send [place] a message through [place].   We are unable to tell what your co-pay for medications will be in advance as this is different depending on your insurance coverage. However, we may be able to find a substitute medication at lower cost or fill out paperwork to get insurance to cover a needed medication.   If a prior authorization is required to get your medication covered by your insurance company, please allow [place] 1-2 business days to complete this process.  Drug prices often vary depending on where the prescription is filled and some pharmacies may offer cheaper prices.  The website www.goodrx.com contains coupons for medications through different pharmacies. The prices here do not account for what the cost may be with help from insurance (it may be cheaper with your insurance), but the  website can give you the price if you did not use any insurance.  - You can print the associated coupon and take it with your prescription to the pharmacy.  - You may also stop by our office during regular business hours and pick up a GoodRx coupon card.  - If you need your prescription sent electronically to a different pharmacy, notify our office through [facility] or by phone at [phone] option 4.     Si Usted Necesita Algo Despus de Su Visita  Tambin puede enviarnos un mensaje a travs de Pharmacist, community. Por lo general respondemos a los mensajes de MyChart en el transcurso de 1 a 2 das hbiles.  Para renovar recetas, por favor pida a su farmacia que se ponga en contacto con nuestra oficina. Harland Dingwall de fax es [place] [phone].  Si tiene un asunto urgente cuando la clnica est  cerrada y que no puede esperar hasta el siguiente da hbil, puede llamar/localizar a su doctor(a) al nmero que aparece a continuacin.   Por favor, tenga en cuenta que aunque hacemos todo lo posible para estar disponibles para asuntos urgentes fuera del horario de [place], no estamos disponibles las 24 horas del da, los 7 das de la [place].   Si tiene un problema urgente y no puede comunicarse con nosotros, puede optar por buscar atencin mdica  en el consultorio de su doctor(a), en una clnica privada, en un centro de atencin urgente o en una sala de emergencias.  Si tiene Engineering geologist, por favor llame inmediatamente al 911 o vaya a la sala de emergencias.  Nmeros de bper  - Dr. Nehemiah Massed: [phone]  - Dra. Moye: [phone]  - Dra. Nicole Kindred: [phone]  En caso de inclemencias del [place], por favor llame a Johnsie Kindred principal al [phone] para una actualizacin sobre el [place] de cualquier retraso o cierre.  Consejos para la medicacin en dermatologa: Por favor, guarde las cajas en las que vienen los medicamentos de uso tpico para ayudarle a seguir las  instrucciones sobre dnde y cmo usarlos. Las farmacias generalmente imprimen las instrucciones del medicamento slo en las cajas y no directamente en los tubos del [place].   Si su medicamento es muy caro, por favor, pngase en contacto con Zigmund Daniel llamando al [phone] y presione la opcin 4 o envenos un mensaje a travs de Pharmacist, community.   No podemos decirle cul ser su copago por los medicamentos por adelantado ya que esto es diferente dependiendo de la cobertura de su seguro. Sin embargo, es posible que podamos encontrar un medicamento sustituto a Electrical engineer un formulario para que el seguro cubra el medicamento que se considera necesario.   Si se requiere una autorizacin previa para que su compaa de seguros [place] su medicamento, por favor permtanos de 1 a 2 das hbiles para completar este proceso.  Los precios de los medicamentos varan con frecuencia dependiendo del Environmental consultant de dnde se surte la receta y alguna farmacias pueden ofrecer precios ms baratos.  El sitio web www.goodrx.com tiene cupones para medicamentos de Airline pilot. Los precios aqu no tienen en cuenta lo que podra costar con la ayuda del seguro (puede ser ms barato con su seguro), pero el sitio web puede darle el precio si no utiliz Research scientist (physical sciences).  - Puede imprimir el cupn correspondiente y llevarlo con su receta a la farmacia.  - Tambin puede pasar por nuestra oficina durante el horario de atencin regular y Charity fundraiser una tarjeta de cupones de GoodRx.  - Si necesita que su receta se enve electrnicamente a una farmacia diferente, informe a nuestra oficina a travs de MyChart de [place] o por telfono llamando al [phone] y presione la opcin 4.

## 2022-03-30 NOTE — Progress Notes (Signed)
Follow-Up Visit   Subjective  Nicole Young is a 82 y.o. female who presents for the following: Skin Cancer Screening and Full Body Skin Exam. Recheck wart on the right thumb treated with LN2 several times with a poor response.   The patient presents for Total-Body Skin Exam (TBSE) for skin cancer screening and mole check. The patient has spots, moles and lesions to be evaluated, some may be new or changing and the patient has concerns that these could be cancer.   The following portions of the chart were reviewed this encounter and updated as appropriate: medications, allergies, medical history  Review of Systems:  No other skin or systemic complaints except as noted in HPI or Assessment and Plan.  Objective  Well appearing patient in no apparent distress; mood and affect are within normal limits.  A full examination was performed including scalp, head, eyes, ears, nose, lips, neck, chest, axillae, abdomen, back, buttocks, bilateral upper extremities, bilateral lower extremities, hands, feet, fingers, toes, fingernails, and toenails. All findings within normal limits unless otherwise noted below.   Relevant physical exam findings are noted in the Assessment and Plan.    Assessment & Plan    Lentigines, Seborrheic Keratoses, Hemangiomas - Benign normal skin lesions - Benign-appearing - Call for any changes  Melanocytic Nevi - Tan-brown and/or pink-flesh-colored symmetric macules and papules - Benign appearing on exam today - Observation - Call clinic for new or changing moles - Recommend daily use of broad spectrum spf 30+ sunscreen to sun-exposed areas.   Actinic Damage - Chronic condition, secondary to cumulative UV/sun exposure - diffuse scaly erythematous macules with underlying dyspigmentation - Recommend daily broad spectrum sunscreen SPF 30+ to sun-exposed areas, reapply every 2 hours as needed.  - Staying in the shade or wearing long sleeves, sun glasses (UVA+UVB  protection) and wide brim hats (4-inch brim around the entire circumference of the hat) are also recommended for sun protection.  - Call for new or changing lesions.  WART Exam: verrucous papule(s) Location: right thumb    Discussed viral / HPV (Human Papilloma Virus) etiology and risk of spread /infectivity to other areas of body as well as to other people.  Multiple treatments and methods may be required to clear warts and it is possible treatment may not be successful.  Treatment risks include discoloration; scarring and there is still potential for wart recurrence.  Chronic and persistent condition with duration or expected duration over one year. Condition is bothersome/symptomatic for patient. Currently flared.  Treatment Plan: Start rx Fluorouracil 5% cream apply to wart at night followed by otc Salicylic aid and cover with a band aid   May consider skin medicinals wart solution/paste or in office treatment  LN2-Squaric acid with  Cantharidin plus in the future if no better   INFLAMED SEBORRHEIC KERATOSIS  Symptomatic, irritating, patient would like treated.  Benign-appearing.  Call clinic for new or changing lesions.   Prior to procedure, discussed risks of blister formation, small wound, skin dyspigmentation, or rare scar following treatment. Recommend Vaseline ointment to treated areas while healing.  Destruction Procedure Note Destruction method: cryotherapy   Informed consent: discussed and consent obtained   Lesion destroyed using liquid nitrogen: Yes   Cryotherapy cycles:  2 Outcome: patient tolerated procedure well with no complications   Post-procedure details: wound care instructions given   Locations: left anterior shoulder x 2 # of Lesions Treated: 2   Skin cancer screening performed today.   Return in about 1 year (  around 03/30/2023) for TBSE.   I, Marye Round, CMA, am acting as scribe for Forest Gleason, MD .    Documentation: I have reviewed the above  documentation for accuracy and completeness, and I agree with the above.  Forest Gleason, MD

## 2022-04-04 ENCOUNTER — Encounter: Payer: Self-pay | Admitting: Dermatology

## 2022-04-10 DIAGNOSIS — H353221 Exudative age-related macular degeneration, left eye, with active choroidal neovascularization: Secondary | ICD-10-CM | POA: Diagnosis not present

## 2022-04-18 ENCOUNTER — Other Ambulatory Visit: Payer: Self-pay | Admitting: Family Medicine

## 2022-04-18 DIAGNOSIS — E039 Hypothyroidism, unspecified: Secondary | ICD-10-CM | POA: Diagnosis not present

## 2022-04-18 DIAGNOSIS — E7800 Pure hypercholesterolemia, unspecified: Secondary | ICD-10-CM | POA: Diagnosis not present

## 2022-04-18 DIAGNOSIS — R159 Full incontinence of feces: Secondary | ICD-10-CM | POA: Diagnosis not present

## 2022-04-18 DIAGNOSIS — N816 Rectocele: Secondary | ICD-10-CM | POA: Diagnosis not present

## 2022-04-18 DIAGNOSIS — I10 Essential (primary) hypertension: Secondary | ICD-10-CM | POA: Diagnosis not present

## 2022-04-18 DIAGNOSIS — F32A Depression, unspecified: Secondary | ICD-10-CM | POA: Diagnosis not present

## 2022-04-18 DIAGNOSIS — G4733 Obstructive sleep apnea (adult) (pediatric): Secondary | ICD-10-CM | POA: Diagnosis not present

## 2022-04-18 DIAGNOSIS — E119 Type 2 diabetes mellitus without complications: Secondary | ICD-10-CM | POA: Diagnosis not present

## 2022-04-20 DIAGNOSIS — E1165 Type 2 diabetes mellitus with hyperglycemia: Secondary | ICD-10-CM | POA: Diagnosis not present

## 2022-04-20 DIAGNOSIS — M810 Age-related osteoporosis without current pathological fracture: Secondary | ICD-10-CM | POA: Diagnosis not present

## 2022-05-01 DIAGNOSIS — M810 Age-related osteoporosis without current pathological fracture: Secondary | ICD-10-CM | POA: Diagnosis not present

## 2022-05-01 DIAGNOSIS — I10 Essential (primary) hypertension: Secondary | ICD-10-CM | POA: Diagnosis not present

## 2022-05-01 DIAGNOSIS — E1142 Type 2 diabetes mellitus with diabetic polyneuropathy: Secondary | ICD-10-CM | POA: Diagnosis not present

## 2022-05-04 DIAGNOSIS — R059 Cough, unspecified: Secondary | ICD-10-CM | POA: Diagnosis not present

## 2022-05-04 DIAGNOSIS — J301 Allergic rhinitis due to pollen: Secondary | ICD-10-CM | POA: Diagnosis not present

## 2022-05-04 DIAGNOSIS — G4733 Obstructive sleep apnea (adult) (pediatric): Secondary | ICD-10-CM | POA: Diagnosis not present

## 2022-05-04 DIAGNOSIS — K219 Gastro-esophageal reflux disease without esophagitis: Secondary | ICD-10-CM | POA: Diagnosis not present

## 2022-05-08 ENCOUNTER — Other Ambulatory Visit: Payer: Self-pay

## 2022-05-10 DIAGNOSIS — I10 Essential (primary) hypertension: Secondary | ICD-10-CM | POA: Diagnosis not present

## 2022-05-15 ENCOUNTER — Encounter: Payer: Self-pay | Admitting: Physician Assistant

## 2022-05-15 ENCOUNTER — Ambulatory Visit (INDEPENDENT_AMBULATORY_CARE_PROVIDER_SITE_OTHER): Payer: Medicare Other | Admitting: Physician Assistant

## 2022-05-15 VITALS — BP 126/78 | HR 81 | Temp 97.8°F | Ht 60.0 in | Wt 143.5 lb

## 2022-05-15 DIAGNOSIS — K219 Gastro-esophageal reflux disease without esophagitis: Secondary | ICD-10-CM

## 2022-05-15 DIAGNOSIS — Z1211 Encounter for screening for malignant neoplasm of colon: Secondary | ICD-10-CM | POA: Diagnosis not present

## 2022-05-15 NOTE — Patient Instructions (Signed)
Please schedule 3 month follow up with Dr Servando Snare

## 2022-05-15 NOTE — Progress Notes (Signed)
Celso Amy, PA-C [address]  [address]  [place], [place] [address]  Main: [phone]  Fax: [phone]   Gastroenterology Consultation  Referring Provider:     Bud Face, MD Primary Care Physician:  Dr. Megan Mans. Primary Gastroenterologist:  Dr. Midge Minium  Reason for Consultation:     GERD        HPI:   Nicole Young is a 82 y.o. y/o female referred for consultation & management  by ENT Dr. Andee Poles.  Referred from ENT for GERD and to schedule EGD.  Patient states she has history of acid reflux for many (over 20) years.  She reports seeing an ENT specialist in the past year.  She had a laryngoscopy and was diagnosed with acid reflux.  Symptoms include globus sensation, nocturnal acid regurgitation, phlegm in her throat, constant sore throat, and runny nose.  She has frequent hiccups when eating.  She denies abdominal pain.  Occasionally dry chicken get stuck when she eats.  No other dysphagia.  Is currently taking pantoprazole 40 mg every morning and famotidine 20 mg nightly with some benefit.  She reports having 1 EGD done at age 67.  Has not had repeat EGD since then.  She is requesting to have an EGD to evaluate for esophageal damage from GERD.  She is also wondering when she needs another screening colonoscopy.  Her last colonoscopy done 06/2014 by Dr. Lemar Livings showed 1 small 5 mm hyperplastic rectal polyp removed, otherwise normal.  Good prep.  She has remote family history of a cousin who had colon cancer at age 22.  She denies any lower GI symptoms or rectal bleeding.   Past Medical History:  Diagnosis Date   AMD (acid maltase deficiency) (HCC)    Complication of anesthesia    slow to wake up in past   Depression    controlled   Diabetes mellitus without complication (HCC)    controlled   Hyperlipidemia    Hypothyroidism    Neuropathy    Osteoarthritis    knee, ankles, shoulders, back, neck, hands/fingers, wrists;    Rectocele     Thyroid disease    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HCC)     Past Surgical History:  Procedure Laterality Date   AUGMENTATION MAMMAPLASTY  1975   AUGMENTATION MAMMAPLASTY  2017   2nd augmentation and lift   BREAST SURGERY     impants replaced with lift   CARPAL TUNNEL RELEASE Right 08/26/2014   Procedure: CARPAL TUNNEL RELEASE;  Surgeon: Deeann Saint, MD;  Location: [facility] ORS;  Service: Orthopedics;  Laterality: Right;   COLONOSCOPY  2005   Dr Lemar Livings   COLONOSCOPY N/A 06/24/2014   Procedure: COLONOSCOPY;  Surgeon: Earline Mayotte, MD;  Location: [facility] ENDOSCOPY;  Service: Endoscopy;  Laterality: N/A;   PARATHYROIDECTOMY  1992   PLACEMENT OF BREAST IMPLANTS  1975   TONSILECTOMY, ADENOIDECTOMY, BILATERAL MYRINGOTOMY AND TUBES      Prior to Admission medications   Medication Sig Start Date End Date Taking? Authorizing Provider  Accu-Chek FastClix Lancets MISC CHECK SUGAR ONCE DAILY. DX: E11.9 09/20/18  Yes Bosie Clos, MD  ACCU-CHEK SMARTVIEW test strip USE TO CHECK BLOOD SUGAR ONCE A DAY 11/26/20  Yes Bosie Clos, MD  cholecalciferol (VITAMIN D3) 25 MCG (1000 UT) tablet Take 600 Units by mouth daily.   Yes [provider]  estradiol (ESTRACE) 0.1 MG/GM vaginal cream 1 gram intravaginally at bedtime daily for 2-3 weeks, then decrease to  3 times per week 10/31/21  Yes MacDiarmid, Lorin Picket, MD  famotidine (PEPCID) 20 MG tablet Take 20 mg by mouth daily. 05/04/22  Yes [provider]  fluorouracil (EFUDEX) 5 % cream Apply to affected wart at bedtime 03/30/22  Yes Moye, [place], MD  guaiFENesin (MUCINEX PO) Take by mouth.   Yes [provider]  guaiFENesin-codeine (CHERATUSSIN AC) 100-10 MG/5ML syrup Take 5 mLs by mouth at bedtime as needed for cough (up to two doses daily). 02/22/21  Yes Drubel, Lillia Abed, PA-C  hydroquinone 4 % cream Apply topically 2 (two) times daily. 12/08/20  Yes Willeen Niece, MD  levothyroxine (SYNTHROID) 50 MCG tablet Take 1 tablet  (50 mcg total) by mouth daily before breakfast. 12/05/21  Yes Jacky Kindle, FNP  losartan (COZAAR) 25 MG tablet Take 1 tablet by mouth daily. 05/10/22 05/10/23 Yes [provider]  Magnesium 250 MG TABS Take 1 tablet by mouth daily.   Yes [provider]  metFORMIN (GLUCOPHAGE-XR) 500 MG 24 hr tablet TAKE 1 TABLET TWICE A DAY Patient taking differently: 500 mg 2 (two) times daily before a meal. 05/26/21  Yes Bosie Clos, MD  methocarbamol (ROBAXIN) 500 MG tablet as needed.   Yes [provider]  mometasone (ELOCON) 0.1 % cream Apply 1 application topically See admin instructions. Use 1 - 2 times daily until clear, then use as a spot treatment when needed 02/15/21  Yes Willeen Niece, MD  Multiple Vitamin (MULTIVITAMIN) capsule Take 1 capsule by mouth daily.   Yes [provider]  Multiple Vitamins-Minerals (PRESERVISION AREDS PO) Take by mouth 2 (two) times daily.   Yes [provider]  pantoprazole (PROTONIX) 40 MG tablet Take 1 tablet (40 mg total) by mouth daily. 10/04/21  Yes Bosie Clos, MD  rosuvastatin (CRESTOR) 20 MG tablet TAKE 1 TABLET DAILY 03/30/21  Yes Bosie Clos, MD  sertraline (ZOLOFT) 100 MG tablet TAKE 1 TABLET DAILY 04/04/21  Yes Bosie Clos, MD    Family History  Problem Relation Age of Onset   Cancer Father        prostate   Prostate cancer Father    Hypertension Mother    Heart attack Mother    Heart attack Maternal Grandfather    Diabetes Paternal Uncle    Congestive Heart Failure Maternal Grandmother    CVA Paternal Grandmother    Prostate cancer Paternal Grandfather    Breast cancer Maternal Aunt      Social History   Tobacco Use   Smoking status: Never   Smokeless tobacco: Never  Vaping Use   Vaping Use: Never used  Substance Use Topics   Alcohol use: Yes    Alcohol/week: 1.0 standard drink of alcohol    Types: 1 Glasses of wine per week   Drug use: No    Allergies as of 05/15/2022 -  Review Complete 04/04/2022  Allergen Reaction Noted   Neomycin-polymyxin-gramicidin Other (See Comments) 11/17/2014   Farxiga [dapagliflozin] Other (See Comments) 06/19/2014   Neosporin [neomycin-bacitracin zn-polymyx] Swelling 08/22/2013   Sulfa antibiotics Other (See Comments) 12/11/2018   Thimerosal (thiomersal) Swelling 04/16/2014   Zostavax  [zoster vaccine live] Rash 11/12/2014    Review of Systems:    All systems reviewed and negative except where noted in HPI.   Physical Exam:  BP 126/78   Pulse 81   Temp 97.8 F (36.6 C) (Oral)   Ht 5' (1.524 m)   Wt 143 lb 8 oz (65.1 kg)   BMI 28.03  kg/m  No LMP recorded. Patient is postmenopausal. Psych:  Alert and cooperative. Normal mood and affect. General:   Alert,  Well-developed, well-nourished, pleasant and cooperative in NAD Head:  Normocephalic and atraumatic. Lungs:  Respirations even and unlabored.  Clear throughout to auscultation.   No wheezes, crackles, or rhonchi. No acute distress. Heart:  Regular rate and rhythm; no murmurs, clicks, rubs, or gallops. Abdomen:  Normal bowel sounds.  No bruits.  Soft, non-tender and non-distended without masses, hepatosplenomegaly or hernias noted.  No guarding or rebound tenderness.    Neurologic:  Alert and oriented x3;  grossly normal neurologically. Psych:  Alert and cooperative. Normal mood and affect.  Imaging Studies: No results found.  Assessment and Plan:   Nicole Young is a 82 y.o. y/o female has been referred for GERD  GERD  Continue Pantoprazole 40mg  QAM and Famotidine 20mg  QPM.  Scheduling EGD with Dr. Servando Snare  Recommend Lifestyle Modifications to prevent Acid Reflux.  Rec. Avoid coffee, sodas, peppermint, citrus fruits, and spicey foods.  Avoid eating 2-3 hours before bedtime.   I have discussed risks & benefits of the procedure  which include, but are not limited to, bleeding, infection, perforation,respiratory compromise & drug reaction.  The patient agrees with  this plan & written consent will be obtained.     2.  Colon Cancer Screening  Colon cancer screening guidelines were discussed.  Repeat screening colonoscopy is not recommended over age 68 due to increased risk of procedure, bleeding or perforation.  Patient had normal/negative colonoscopy 06/2014.  Gave patient reassurance.   Follow up in 3 months with Dr. Murlean Hark.  Celso Amy, PA-C

## 2022-05-16 ENCOUNTER — Other Ambulatory Visit: Payer: Self-pay

## 2022-05-30 DIAGNOSIS — K134 Granuloma and granuloma-like lesions of oral mucosa: Secondary | ICD-10-CM | POA: Diagnosis not present

## 2022-05-30 DIAGNOSIS — K1120 Sialoadenitis, unspecified: Secondary | ICD-10-CM | POA: Diagnosis not present

## 2022-06-07 DIAGNOSIS — M3501 Sicca syndrome with keratoconjunctivitis: Secondary | ICD-10-CM | POA: Diagnosis not present

## 2022-06-07 DIAGNOSIS — H2000 Unspecified acute and subacute iridocyclitis: Secondary | ICD-10-CM | POA: Diagnosis not present

## 2022-06-16 DIAGNOSIS — J301 Allergic rhinitis due to pollen: Secondary | ICD-10-CM | POA: Diagnosis not present

## 2022-06-16 DIAGNOSIS — G4733 Obstructive sleep apnea (adult) (pediatric): Secondary | ICD-10-CM | POA: Diagnosis not present

## 2022-06-16 DIAGNOSIS — R059 Cough, unspecified: Secondary | ICD-10-CM | POA: Diagnosis not present

## 2022-06-16 DIAGNOSIS — K219 Gastro-esophageal reflux disease without esophagitis: Secondary | ICD-10-CM | POA: Diagnosis not present

## 2022-06-17 ENCOUNTER — Other Ambulatory Visit: Payer: Self-pay | Admitting: Family Medicine

## 2022-06-17 DIAGNOSIS — E039 Hypothyroidism, unspecified: Secondary | ICD-10-CM

## 2022-06-19 NOTE — Telephone Encounter (Signed)
Pt is pt of Dr. Sullivan Lone at [facility]  Requested Prescriptions  Pending Prescriptions Disp Refills   levothyroxine (SYNTHROID) 50 MCG tablet [Pharmacy Med Name: LEVOTHYROXINE 50 MCG TABLET] 90 tablet 1    Sig: TAKE 1 TABLET BY MOUTH DAILY BEFORE BREAKFAST     Endocrinology:  Hypothyroid Agents Failed - 06/17/2022  8:43 AM      Failed - TSH in normal range and within 360 days    TSH  Date Value Ref Range Status  09/10/2020 2.580 0.450 - 4.500 uIU/mL Final         Passed - Valid encounter within last 12 months    Recent Outpatient Visits           7 months ago Acute bacterial sinusitis   [facility] Merita Norton T, FNP   8 months ago Gastroesophageal reflux disease without esophagitis   [place] [facility] Bosie Clos, MD   9 months ago Essential (primary) hypertension   [place] [facility] Bosie Clos, MD   1 year ago Essential (primary) hypertension   [place] [facility] Bosie Clos, MD   1 year ago Acute bronchitis, unspecified organism   [facility] Alfredia Ferguson, PA-C       Future Appointments             In 4 months MacDiarmid, Lorin Picket, MD [facility]

## 2022-06-26 DIAGNOSIS — H353221 Exudative age-related macular degeneration, left eye, with active choroidal neovascularization: Secondary | ICD-10-CM | POA: Diagnosis not present

## 2022-07-11 ENCOUNTER — Ambulatory Visit: Payer: Medicare Other | Admitting: Certified Registered"

## 2022-07-11 ENCOUNTER — Encounter
Admission: RE | Disposition: A | Discharge status: Home / Self Care | Payer: Self-pay | Source: Ambulatory Visit | Attending: Gastroenterology

## 2022-07-11 ENCOUNTER — Ambulatory Visit
Admission: RE | Admit: 2022-07-11 | Discharge: 2022-07-11 | Disposition: A | Discharge status: Home / Self Care | Payer: Medicare Other | Source: Ambulatory Visit | Attending: Gastroenterology | Admitting: Gastroenterology

## 2022-07-11 ENCOUNTER — Encounter: Payer: Self-pay | Admitting: Gastroenterology

## 2022-07-11 DIAGNOSIS — K2950 Unspecified chronic gastritis without bleeding: Secondary | ICD-10-CM | POA: Diagnosis not present

## 2022-07-11 DIAGNOSIS — B9681 Helicobacter pylori [H. pylori] as the cause of diseases classified elsewhere: Secondary | ICD-10-CM | POA: Insufficient documentation

## 2022-07-11 DIAGNOSIS — E785 Hyperlipidemia, unspecified: Secondary | ICD-10-CM | POA: Insufficient documentation

## 2022-07-11 DIAGNOSIS — E1140 Type 2 diabetes mellitus with diabetic neuropathy, unspecified: Secondary | ICD-10-CM | POA: Insufficient documentation

## 2022-07-11 DIAGNOSIS — Z7984 Long term (current) use of oral hypoglycemic drugs: Secondary | ICD-10-CM | POA: Diagnosis not present

## 2022-07-11 DIAGNOSIS — K219 Gastro-esophageal reflux disease without esophagitis: Secondary | ICD-10-CM | POA: Diagnosis not present

## 2022-07-11 DIAGNOSIS — K449 Diaphragmatic hernia without obstruction or gangrene: Secondary | ICD-10-CM | POA: Diagnosis not present

## 2022-07-11 DIAGNOSIS — K30 Functional dyspepsia: Secondary | ICD-10-CM | POA: Diagnosis not present

## 2022-07-11 DIAGNOSIS — H353290 Exudative age-related macular degeneration, unspecified eye, stage unspecified: Secondary | ICD-10-CM | POA: Insufficient documentation

## 2022-07-11 DIAGNOSIS — E890 Postprocedural hypothyroidism: Secondary | ICD-10-CM | POA: Diagnosis not present

## 2022-07-11 DIAGNOSIS — I10 Essential (primary) hypertension: Secondary | ICD-10-CM | POA: Insufficient documentation

## 2022-07-11 HISTORY — PX: ESOPHAGOGASTRODUODENOSCOPY (EGD) WITH PROPOFOL: SHX5813

## 2022-07-11 HISTORY — DX: Essential (primary) hypertension: I10

## 2022-07-11 HISTORY — PX: BIOPSY: SHX5522

## 2022-07-11 LAB — GLUCOSE, CAPILLARY: Glucose-Capillary: 148 mg/dL — ABNORMAL HIGH (ref 70–99)

## 2022-07-11 SURGERY — ESOPHAGOGASTRODUODENOSCOPY (EGD) WITH PROPOFOL
Anesthesia: General

## 2022-07-11 MED ORDER — PROPOFOL 10 MG/ML IV BOLUS
INTRAVENOUS | Status: DC | PRN
  Administered 2022-07-11: 30 mg via INTRAVENOUS
  Administered 2022-07-11: 50 mg via INTRAVENOUS

## 2022-07-11 MED ORDER — SODIUM CHLORIDE 0.9 % IV SOLN: INTRAVENOUS | Status: DC

## 2022-07-11 NOTE — Anesthesia Postprocedure Evaluation (Signed)
Anesthesia Post Note  Patient: HELLENA DUNNER  Procedure(s) Performed: ESOPHAGOGASTRODUODENOSCOPY (EGD) WITH PROPOFOL BIOPSY  Patient location during evaluation: Endoscopy Anesthesia Type: General Level of consciousness: awake and alert Pain management: pain level controlled Vital Signs Assessment: post-procedure vital signs reviewed and stable Respiratory status: spontaneous breathing, nonlabored ventilation and respiratory function stable Cardiovascular status: blood pressure returned to baseline and stable Postop Assessment: no apparent nausea or vomiting Anesthetic complications: no   No notable events documented.   Last Vitals:  Vitals:   07/11/22 0920 07/11/22 0940  BP: 119/62 (!) 169/74  Pulse:    Resp:    Temp: (!) 36.1 C   SpO2:      Last Pain:  Vitals:   07/11/22 0940  TempSrc:   PainSc: 0-No pain                 Foye Deer

## 2022-07-11 NOTE — Transfer of Care (Signed)
Immediate Anesthesia Transfer of Care Note  Patient: Nicole Young  Procedure(s) Performed: ESOPHAGOGASTRODUODENOSCOPY (EGD) WITH PROPOFOL BIOPSY  Patient Location: PACU  Anesthesia Type:General  Level of Consciousness: awake and alert   Airway & Oxygen Therapy: Patient Spontanous Breathing and Patient connected to nasal cannula oxygen  Post-op Assessment: Report given to RN and Post -op Vital signs reviewed and stable  Post vital signs: Reviewed and stable  Last Vitals:  Vitals Value Taken Time  BP    Temp    Pulse 68 07/11/22 0920  Resp 18 07/11/22 0920  SpO2 92 % 07/11/22 0920  Vitals shown include unvalidated device data.  Last Pain:  Vitals:   07/11/22 0844  TempSrc: Temporal         Complications: No notable events documented.

## 2022-07-11 NOTE — Op Note (Signed)
[facility] Gastroenterology Patient Name: Nicole Young Procedure Date: 07/11/2022 8:59 AM MRN: [id number] Account #: [id number] Date of Birth: Sep 26, 1940 Admit Type: Outpatient Age: 82 Room: [facility] ENDO ROOM 4 Gender: Female Note Status: Finalized Instrument Name: Patton Salles Endoscope [id number] Procedure:             Upper GI endoscopy Indications:           Functional Dyspepsia Providers:             Midge Minium MD, MD Medicines:             Propofol per Anesthesia Complications:         No immediate complications. Procedure:             Pre-Anesthesia Assessment:                        - Prior to the procedure, a History and Physical was                         performed, and patient medications and allergies were                         reviewed. The patient's tolerance of previous                         anesthesia was also reviewed. The risks and benefits                         of the procedure and the sedation options and risks                         were discussed with the patient. All questions were                         answered, and informed consent was obtained. Prior                         Anticoagulants: The patient has taken no anticoagulant                         or antiplatelet agents. ASA Grade Assessment: II - A                         patient with mild systemic disease. After reviewing                         the risks and benefits, the patient was deemed in                         satisfactory condition to undergo the procedure.                        After obtaining informed consent, the endoscope was                         passed under direct vision. Throughout the procedure,                         the patient's blood  pressure, pulse, and oxygen                         saturations were monitored continuously. The Endoscope                         was introduced through the mouth, and advanced to the                         second part of  duodenum. The upper GI endoscopy was                         accomplished without difficulty. The patient tolerated                         the procedure well. Findings:      A small hiatal hernia was present.      Diffuse mildly erythematous mucosa without bleeding was found in the       gastric body and in the gastric antrum. Biopsies were taken with a cold       forceps for histology.      The examined duodenum was normal. Impression:            - Small hiatal hernia.                        - Erythematous mucosa in the gastric body and antrum.                         Biopsied.                        - Normal examined duodenum. Recommendation:        - Discharge patient to home.                        - Resume previous diet.                        - Continue present medications.                        - Await pathology results. Procedure Code(s):     --- Professional ---                        [phone], Esophagogastroduodenoscopy, flexible,                         transoral; with biopsy, single or multiple Diagnosis Code(s):     --- Professional ---                        K30, Functional dyspepsia                        K31.89, Other diseases of stomach and duodenum CPT copyright 2022 American Medical Association. All rights reserved. The codes documented in this report are preliminary and upon coder review may  be revised to meet current compliance requirements. Midge Minium MD, MD 07/11/2022 9:17:30 AM This report has been signed electronically. Number of Addenda: 0 Note Initiated On: 07/11/2022 8:59 AM  Estimated Blood Loss:  Estimated blood loss: none.      [facility]

## 2022-07-11 NOTE — H&P (Signed)
Midge Minium, MD [facility] [address] [place], [place] [address] [phone] Fax : [phone]  Primary Care Physician:  Bosie Clos, MD Primary Gastroenterologist:  Dr. Servando Snare  Pre-Procedure History & Physical: HPI:  Nicole Young is a 82 y.o. female is here for an endoscopy.   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HCC)     Past Surgical History:  Procedure Laterality Date   AUGMENTATION MAMMAPLASTY  1975   AUGMENTATION MAMMAPLASTY  2017   2nd augmentation and lift   BREAST SURGERY     impants replaced with lift   CARPAL TUNNEL RELEASE Right 08/26/2014   Procedure: CARPAL TUNNEL RELEASE;  Surgeon: Deeann Saint, MD;  Location: [facility] ORS;  Service: Orthopedics;  Laterality: Right;   COLONOSCOPY  2005   Dr Lemar Livings   COLONOSCOPY N/A 06/24/2014   Procedure: COLONOSCOPY;  Surgeon: Earline Mayotte, MD;  Location: [facility] ENDOSCOPY;  Service: Endoscopy;  Laterality: N/A;   PARATHYROIDECTOMY  1992   PLACEMENT OF BREAST IMPLANTS  1975   TONSILECTOMY, ADENOIDECTOMY, BILATERAL MYRINGOTOMY AND TUBES      Prior to Admission medications   Medication Sig Start Date End Date Taking? Authorizing Provider  levothyroxine (SYNTHROID) 50 MCG tablet Take 1 tablet (50 mcg total) by mouth daily before breakfast. 12/05/21  Yes Jacky Kindle, FNP  losartan (COZAAR) 25 MG tablet Take 1 tablet by mouth daily. 05/10/22 05/10/23 Yes [provider]  metFORMIN (GLUCOPHAGE-XR) 500 MG 24 hr tablet TAKE 1 TABLET TWICE A DAY Patient taking differently: 500 mg 2 (two) times daily  before a meal. 05/26/21  Yes Bosie Clos, MD  pantoprazole (PROTONIX) 40 MG tablet Take 1 tablet (40 mg total) by mouth daily. 10/04/21  Yes Bosie Clos, MD  rosuvastatin (CRESTOR) 20 MG tablet TAKE 1 TABLET DAILY 03/30/21  Yes Bosie Clos, MD  sertraline (ZOLOFT) 100 MG tablet TAKE 1 TABLET DAILY 04/04/21  Yes Bosie Clos, MD  Accu-Chek FastClix Lancets MISC CHECK SUGAR ONCE DAILY. DX: E11.9 09/20/18   Bosie Clos, MD  ACCU-CHEK SMARTVIEW test strip USE TO CHECK BLOOD SUGAR ONCE A DAY 11/26/20   Bosie Clos, MD  cholecalciferol (VITAMIN D3) 25 MCG (1000 UT) tablet Take 600 Units by mouth daily.    [provider]  estradiol (ESTRACE) 0.1 MG/GM vaginal cream 1 gram intravaginally at bedtime daily for 2-3 weeks, then decrease to 3 times per week 10/31/21   Alfredo Martinez, MD  famotidine (PEPCID) 20 MG tablet Take 20 mg by mouth daily. 05/04/22   [provider]  fluorouracil (EFUDEX) 5 % cream Apply to affected wart at bedtime 03/30/22   Moye, [place], MD  guaiFENesin (MUCINEX PO) Take by mouth.    [provider]  guaiFENesin-codeine (CHERATUSSIN AC) 100-10 MG/5ML syrup Take 5 mLs by mouth at bedtime as needed for cough (up to two doses daily). 02/22/21   Alfredia Ferguson, PA-C  hydroquinone 4 % cream Apply topically 2 (two) times daily. 12/08/20  Willeen Niece, MD  Magnesium 250 MG TABS Take 1 tablet by mouth daily.    [provider]  methocarbamol (ROBAXIN) 500 MG tablet as needed.    [provider]  mometasone (ELOCON) 0.1 % cream Apply 1 application topically See admin instructions. Use 1 - 2 times daily until clear, then use as a spot treatment when needed 02/15/21   Willeen Niece, MD  Multiple Vitamin (MULTIVITAMIN) capsule Take 1 capsule by mouth daily.    [provider]  Multiple Vitamins-Minerals (PRESERVISION AREDS PO) Take by mouth 2 (two) times daily.    [provider]    Allergies  as of 05/15/2022 - Review Complete 05/15/2022  Allergen Reaction Noted   Neomycin-polymyxin-gramicidin Other (See Comments) 11/17/2014   Farxiga [dapagliflozin] Other (See Comments) 06/19/2014   Neosporin [neomycin-bacitracin zn-polymyx] Swelling 08/22/2013   Sulfa antibiotics Other (See Comments) 12/11/2018   Thimerosal (thiomersal) Swelling 04/16/2014   Zostavax  [zoster vaccine live] Rash 11/12/2014    Family History  Problem Relation Age of Onset   Cancer Father        prostate   Prostate cancer Father    Hypertension Mother    Heart attack Mother    Heart attack Maternal Grandfather    Diabetes Paternal Uncle    Congestive Heart Failure Maternal Grandmother    CVA Paternal Grandmother    Prostate cancer Paternal Grandfather    Breast cancer Maternal Aunt     Social History   Socioeconomic History   Marital status: Widowed    Spouse name: Not on file   Number of children: 3   Years of education: Not on file   Highest education level: Bachelor's degree (e.g., BA, AB, BS)  Occupational History   Occupation: retired  Tobacco Use   Smoking status: Never   Smokeless tobacco: Never  Vaping Use   Vaping Use: Never used  Substance and Sexual Activity   Alcohol use: Yes    Alcohol/week: 1.0 standard drink of alcohol    Types: 1 Glasses of wine per week   Drug use: No   Sexual activity: Not on file  Other Topics Concern   Not on file  Social History Narrative   Not on file   Social Determinants of Health   Financial Resource Strain: Low Risk  (02/17/2020)   Overall Financial Resource Strain (CARDIA)    Difficulty of Paying Living Expenses: Not hard at all  Food Insecurity: No Food Insecurity (04/25/2021)   Hunger Vital Sign    Worried About Running Out of Food in the Last Year: Never true    Ran Out of Food in the Last Year: Never true  Transportation Needs: No Transportation Needs (04/25/2021)   PRAPARE - Administrator, Civil Service (Medical): No     Lack of Transportation (Non-Medical): No  Physical Activity: Insufficiently Active (02/22/2021)   Exercise Vital Sign    Days of Exercise per Week: 6 days    Minutes of Exercise per Session: 20 min  Stress: No Stress Concern Present (02/22/2021)   [employer] of Occupational Health - Occupational Stress Questionnaire    Feeling of Stress : Not at all  Social Connections: Socially Isolated (02/22/2021)   Social Connection and Isolation Panel [NHANES]    Frequency of Communication with Friends and Family: More than three times a week    Frequency of Social Gatherings with Friends and Family: More than three times a week    Attends Religious Services: Never  Active Member of Clubs or Organizations: No    Attends Banker Meetings: Never    Marital Status: Widowed  Intimate Partner Violence: Not At Risk (02/22/2021)   Humiliation, Afraid, Rape, and Kick questionnaire    Fear of Current or Ex-Partner: No    Emotionally Abused: No    Physically Abused: No    Sexually Abused: No    Review of Systems: See HPI, otherwise negative ROS  Physical Exam: Ht 5' (1.524 m)   Wt 63 kg   BMI 27.15 kg/m  General:   Alert,  pleasant and cooperative in NAD Head:  Normocephalic and atraumatic. Neck:  Supple; no masses or thyromegaly. Lungs:  Clear throughout to auscultation.    Heart:  Regular rate and rhythm. Abdomen:  Soft, nontender and nondistended. Normal bowel sounds, without guarding, and without rebound.   Neurologic:  Alert and  oriented x4;  grossly normal neurologically.  Impression/Plan: HEATHR SCHWERIN is here for an endoscopy to be performed for GERD  Risks, benefits, limitations, and alternatives regarding  endoscopy have been reviewed with the patient.  Questions have been answered.  All parties agreeable.   Midge Minium, MD  07/11/2022, 8:57 AM

## 2022-07-11 NOTE — Anesthesia Preprocedure Evaluation (Signed)
Anesthesia Evaluation  Patient identified by MRN, date of birth, ID band Patient awake    Reviewed: Allergy & Precautions, H&P , NPO status , Patient's Chart, lab work & pertinent test results  Airway Mallampati: II  TM Distance: >3 FB Neck ROM: full    Dental no notable dental hx.    Pulmonary neg pulmonary ROS   Pulmonary exam normal        Cardiovascular Exercise Tolerance: Good hypertension, Normal cardiovascular exam     Neuro/Psych negative neurological ROS  negative psych ROS   GI/Hepatic negative GI ROS, Neg liver ROS,,,  Endo/Other  diabetesHypothyroidism    Renal/GU negative Renal ROS  negative genitourinary   Musculoskeletal  (+) Arthritis ,    Abdominal Normal abdominal exam  (+)   Peds  Hematology negative hematology ROS (+)   Anesthesia Other Findings Past Medical History: No date: AMD (acid maltase deficiency) (HCC) No date: Complication of anesthesia     Comment:  slow to wake up in past No date: Depression     Comment:  controlled No date: Diabetes mellitus without complication (HCC)     Comment:  controlled No date: Hyperlipidemia No date: Hypertension No date: Hypothyroidism No date: Neuropathy No date: Osteoarthritis     Comment:  knee, ankles, shoulders, back, neck, hands/fingers,               wrists;  No date: Rectocele No date: Thyroid disease No date: 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[facility])  Past Surgical History: 1975: AUGMENTATION MAMMAPLASTY 2017: AUGMENTATION MAMMAPLASTY     Comment:  2nd augmentation and lift No date: BREAST SURGERY     Comment:  impants replaced with lift 08/26/2014: CARPAL TUNNEL RELEASE; Right     Comment:  Procedure: CARPAL TUNNEL RELEASE;  Surgeon: Deeann Saint, MD;  Location: [facility] ORS;  Service: Orthopedics;                Laterality: Right; 2005: COLONOSCOPY     Comment:  Dr Lemar Livings 06/24/2014: COLONOSCOPY; N/A     Comment:   Procedure: COLONOSCOPY;  Surgeon: Earline Mayotte, MD;              Location: [facility] ENDOSCOPY;  Service: Endoscopy;                Laterality: N/A; 1992: PARATHYROIDECTOMY 1975: PLACEMENT OF BREAST IMPLANTS No date: TONSILECTOMY, ADENOIDECTOMY, BILATERAL MYRINGOTOMY AND TUBES  BMI    Body Mass Index: 27.15 kg/m      Reproductive/Obstetrics negative OB ROS                             Anesthesia Physical Anesthesia Plan  ASA: 2  Anesthesia Plan: General   Post-op Pain Management: Minimal or no pain anticipated   Induction: Intravenous  PONV Risk Score and Plan: Propofol infusion and TIVA  Airway Management Planned: Natural Airway  Additional Equipment:   Intra-op Plan:   Post-operative Plan:   Informed Consent: I have reviewed the patients History and Physical, chart, labs and discussed the procedure including the risks, benefits and alternatives for the proposed anesthesia with the patient or authorized representative who has indicated his/her understanding and acceptance.     Dental Advisory Given  Plan Discussed with: CRNA and Surgeon  Anesthesia Plan Comments:         Anesthesia Quick Evaluation

## 2022-07-17 ENCOUNTER — Encounter: Payer: Self-pay | Admitting: Gastroenterology

## 2022-07-19 DIAGNOSIS — N952 Postmenopausal atrophic vaginitis: Secondary | ICD-10-CM | POA: Diagnosis not present

## 2022-07-19 DIAGNOSIS — Z0000 Encounter for general adult medical examination without abnormal findings: Secondary | ICD-10-CM | POA: Diagnosis not present

## 2022-07-19 DIAGNOSIS — E119 Type 2 diabetes mellitus without complications: Secondary | ICD-10-CM | POA: Diagnosis not present

## 2022-07-19 DIAGNOSIS — I10 Essential (primary) hypertension: Secondary | ICD-10-CM | POA: Diagnosis not present

## 2022-07-19 DIAGNOSIS — F320 Major depressive disorder, single episode, mild: Secondary | ICD-10-CM | POA: Diagnosis not present

## 2022-07-19 DIAGNOSIS — M419 Scoliosis, unspecified: Secondary | ICD-10-CM | POA: Diagnosis not present

## 2022-07-19 DIAGNOSIS — Z79899 Other long term (current) drug therapy: Secondary | ICD-10-CM | POA: Diagnosis not present

## 2022-07-20 ENCOUNTER — Other Ambulatory Visit: Payer: Self-pay

## 2022-07-20 MED ORDER — METRONIDAZOLE 500 MG PO TABS: 500.0000 mg | ORAL_TABLET | Freq: Two times a day (BID) | ORAL | 0 refills | Status: DC

## 2022-07-20 MED ORDER — OMEPRAZOLE 20 MG PO CPDR: 20.0000 mg | DELAYED_RELEASE_CAPSULE | Freq: Two times a day (BID) | ORAL | 0 refills | Status: DC

## 2022-07-20 MED ORDER — CLARITHROMYCIN 500 MG PO TABS: 500.0000 mg | ORAL_TABLET | Freq: Two times a day (BID) | ORAL | 0 refills | Status: DC

## 2022-07-26 ENCOUNTER — Telehealth: Payer: Self-pay

## 2022-07-26 MED ORDER — TETRACYCLINE HCL 500 MG PO CAPS: 500.0000 mg | ORAL_CAPSULE | Freq: Four times a day (QID) | ORAL | 0 refills | Status: DC

## 2022-07-26 MED ORDER — BISMUTH SUBSALICYLATE 262 MG/15ML PO SUSP: 30.0000 mL | Freq: Four times a day (QID) | ORAL | 0 refills | Status: DC

## 2022-07-26 NOTE — Addendum Note (Signed)
Addended by: Roena Malady on: 07/26/2022 10:20 AM   Modules accepted: Orders

## 2022-07-26 NOTE — Telephone Encounter (Signed)
Patient has been taking the antibiotics for 4 days now for H pylori. Patient states that after she takes the medication 30 to 40 minutes later she gets really dizzy. She states the dizziness last for 4 to 6 hours after she takes the medication. She states it is so bad she can not function taking the medication. She is wanting to know if the antibiotics can be changed?  Patient is currently on Biaxin 500mg  BID, Flagyl 500mg  BID and Omeprazole 20mg .

## 2022-07-26 NOTE — Telephone Encounter (Signed)
New Rx sent to pharmacy   Pt is aware  I will check on pt tomorrow

## 2022-07-27 NOTE — Telephone Encounter (Signed)
I spoke to pt and she states that the dizziness has improved significantly after stopping the Biaxin.Marland KitchenMarland Kitchen

## 2022-07-31 DIAGNOSIS — H353221 Exudative age-related macular degeneration, left eye, with active choroidal neovascularization: Secondary | ICD-10-CM | POA: Diagnosis not present

## 2022-08-01 NOTE — Telephone Encounter (Signed)
Reminders set to let pt know to hold PPI and retest for H pylori

## 2022-08-28 DIAGNOSIS — H353112 Nonexudative age-related macular degeneration, right eye, intermediate dry stage: Secondary | ICD-10-CM | POA: Diagnosis not present

## 2022-08-28 DIAGNOSIS — H353221 Exudative age-related macular degeneration, left eye, with active choroidal neovascularization: Secondary | ICD-10-CM | POA: Diagnosis not present

## 2022-08-28 DIAGNOSIS — H43813 Vitreous degeneration, bilateral: Secondary | ICD-10-CM | POA: Diagnosis not present

## 2022-08-30 DIAGNOSIS — E1142 Type 2 diabetes mellitus with diabetic polyneuropathy: Secondary | ICD-10-CM | POA: Diagnosis not present

## 2022-09-04 DIAGNOSIS — I10 Essential (primary) hypertension: Secondary | ICD-10-CM | POA: Diagnosis not present

## 2022-09-04 DIAGNOSIS — E1142 Type 2 diabetes mellitus with diabetic polyneuropathy: Secondary | ICD-10-CM | POA: Diagnosis not present

## 2022-09-04 DIAGNOSIS — M810 Age-related osteoporosis without current pathological fracture: Secondary | ICD-10-CM | POA: Diagnosis not present

## 2022-09-04 DIAGNOSIS — G4733 Obstructive sleep apnea (adult) (pediatric): Secondary | ICD-10-CM | POA: Diagnosis not present

## 2022-09-06 ENCOUNTER — Other Ambulatory Visit: Payer: Self-pay | Admitting: Physical Medicine & Rehabilitation

## 2022-09-06 DIAGNOSIS — M5442 Lumbago with sciatica, left side: Secondary | ICD-10-CM | POA: Diagnosis not present

## 2022-09-06 DIAGNOSIS — G8929 Other chronic pain: Secondary | ICD-10-CM | POA: Diagnosis not present

## 2022-09-06 DIAGNOSIS — M5441 Lumbago with sciatica, right side: Secondary | ICD-10-CM | POA: Diagnosis not present

## 2022-09-12 ENCOUNTER — Other Ambulatory Visit: Payer: Self-pay | Admitting: Gastroenterology

## 2022-09-12 DIAGNOSIS — Z8619 Personal history of other infectious and parasitic diseases: Secondary | ICD-10-CM

## 2022-09-12 DIAGNOSIS — K219 Gastro-esophageal reflux disease without esophagitis: Secondary | ICD-10-CM | POA: Diagnosis not present

## 2022-09-12 DIAGNOSIS — J301 Allergic rhinitis due to pollen: Secondary | ICD-10-CM | POA: Diagnosis not present

## 2022-09-19 ENCOUNTER — Telehealth: Payer: Self-pay

## 2022-09-19 NOTE — Telephone Encounter (Signed)
Pt is currently holding PPI for repeat H Pylori test next week... Pt is wanting to know if there is anything else she can take in the meantime to get her through the next week before the H Pylori test... Please advise

## 2022-09-22 DIAGNOSIS — E119 Type 2 diabetes mellitus without complications: Secondary | ICD-10-CM | POA: Diagnosis not present

## 2022-09-22 DIAGNOSIS — L600 Ingrowing nail: Secondary | ICD-10-CM | POA: Diagnosis not present

## 2022-09-22 DIAGNOSIS — B351 Tinea unguium: Secondary | ICD-10-CM | POA: Diagnosis not present

## 2022-09-23 ENCOUNTER — Ambulatory Visit
Admission: RE | Admit: 2022-09-23 | Discharge: 2022-09-23 | Disposition: A | Discharge status: Home / Self Care | Payer: Medicare Other | Source: Ambulatory Visit | Attending: Physical Medicine & Rehabilitation | Admitting: Physical Medicine & Rehabilitation

## 2022-09-23 DIAGNOSIS — G8929 Other chronic pain: Secondary | ICD-10-CM | POA: Diagnosis not present

## 2022-09-23 DIAGNOSIS — M545 Low back pain, unspecified: Secondary | ICD-10-CM | POA: Diagnosis not present

## 2022-09-25 DIAGNOSIS — H353221 Exudative age-related macular degeneration, left eye, with active choroidal neovascularization: Secondary | ICD-10-CM | POA: Diagnosis not present

## 2022-09-27 ENCOUNTER — Telehealth: Payer: Self-pay

## 2022-09-27 DIAGNOSIS — M5416 Radiculopathy, lumbar region: Secondary | ICD-10-CM | POA: Diagnosis not present

## 2022-09-27 DIAGNOSIS — M5441 Lumbago with sciatica, right side: Secondary | ICD-10-CM | POA: Diagnosis not present

## 2022-09-27 DIAGNOSIS — Z8619 Personal history of other infectious and parasitic diseases: Secondary | ICD-10-CM | POA: Diagnosis not present

## 2022-09-27 DIAGNOSIS — G8929 Other chronic pain: Secondary | ICD-10-CM | POA: Diagnosis not present

## 2022-09-27 DIAGNOSIS — M5442 Lumbago with sciatica, left side: Secondary | ICD-10-CM | POA: Diagnosis not present

## 2022-09-27 DIAGNOSIS — M25512 Pain in left shoulder: Secondary | ICD-10-CM | POA: Diagnosis not present

## 2022-09-27 NOTE — Telephone Encounter (Signed)
Pt is aware that repeat H pylori test is due and she will be in this afternoon to have it done

## 2022-09-28 LAB — H. PYLORI BREATH TEST: H pylori Breath Test: NEGATIVE

## 2022-10-02 ENCOUNTER — Ambulatory Visit (INDEPENDENT_AMBULATORY_CARE_PROVIDER_SITE_OTHER): Payer: Medicare Other | Admitting: Internal Medicine

## 2022-10-02 VITALS — BP 136/72 | HR 74 | Resp 14 | Ht 60.0 in | Wt 142.0 lb

## 2022-10-02 DIAGNOSIS — I10 Essential (primary) hypertension: Secondary | ICD-10-CM

## 2022-10-02 DIAGNOSIS — R0683 Snoring: Secondary | ICD-10-CM | POA: Diagnosis not present

## 2022-10-02 NOTE — Progress Notes (Unsigned)
Sleep Medicine   Office Visit  Patient Name: [name] DOB: [birth year] MRN [id number]    Chief Complaint: sleep evaluation  Brief History:  Nicole Young presents with a long history of snoring. Sleep quality is good. This is noted most nights. The patient's bed partner reports  loud snoring at night. The patient relates the following symptoms: snoring and excessive daytime sleepiness are also present. The patient goes to sleep at 10:30 pm and wakes up at 6:15 am.  Sleep quality is same when outside home environment.  Patient has noted occasional restlessness of her legs at night.  The patient  relates no unusual behavior during the night.  The patient reports a history of psychiatric problems (depression) The Epworth Sleepiness Score is 6 out of 24 .  The patient relates  Cardiovascular risk factors include: hypertension.     ROS  General: (-) fever, (-) chills, (-) night sweat Nose and Sinuses: (-) nasal stuffiness or itchiness, (-) postnasal drip, (-) nosebleeds, (-) sinus trouble. Mouth and Throat: (-) sore throat, (-) hoarseness. Neck: (-) swollen glands, (-) enlarged thyroid, (-) neck pain. Respiratory: - cough, - shortness of breath, - wheezing. Neurologic: - numbness, - tingling. Psychiatric: + anxiety, + depression Sleep behavior: -sleep paralysis -hypnogogic hallucinations -dream enactment      -vivid dreams -cataplexy -night terrors -sleep walking   Current Medication: Outpatient Encounter Medications as of 10/02/2022  Medication Sig   diazepam (VALIUM) 5 MG tablet Take by mouth.   Accu-Chek FastClix Lancets MISC CHECK SUGAR ONCE DAILY. DX: E11.9   ACCU-CHEK SMARTVIEW test strip USE TO CHECK BLOOD SUGAR ONCE A DAY   bismuth subsalicylate (PEPTO BISMOL) 262 MG/15ML suspension Take 30 mLs by mouth 4 (four) times daily.   cholecalciferol (VITAMIN D3) 25 MCG (1000 UT) tablet Take 600 Units by mouth daily.   estradiol (ESTRACE) 0.1 MG/GM vaginal cream 1 gram intravaginally at  bedtime daily for 2-3 weeks, then decrease to 3 times per week   famotidine (PEPCID) 20 MG tablet Take 20 mg by mouth daily.   fluorouracil (EFUDEX) 5 % cream Apply to affected wart at bedtime   guaiFENesin (MUCINEX PO) Take by mouth.   guaiFENesin-codeine (CHERATUSSIN AC) 100-10 MG/5ML syrup Take 5 mLs by mouth at bedtime as needed for cough (up to two doses daily).   hydroquinone 4 % cream Apply topically 2 (two) times daily.   levothyroxine (SYNTHROID) 50 MCG tablet Take 1 tablet (50 mcg total) by mouth daily before breakfast.   losartan (COZAAR) 25 MG tablet Take 1 tablet by mouth daily.   Magnesium 250 MG TABS Take 1 tablet by mouth daily.   metFORMIN (GLUCOPHAGE-XR) 500 MG 24 hr tablet TAKE 1 TABLET TWICE A DAY (Patient taking differently: 500 mg 2 (two) times daily before a meal.)   methocarbamol (ROBAXIN) 500 MG tablet as needed.   metroNIDAZOLE (FLAGYL) 500 MG tablet Take 1 tablet (500 mg total) by mouth 2 (two) times daily.   mometasone (ELOCON) 0.1 % cream Apply 1 application topically See admin instructions. Use 1 - 2 times daily until clear, then use as a spot treatment when needed   Multiple Vitamin (MULTIVITAMIN) capsule Take 1 capsule by mouth daily.   Multiple Vitamins-Minerals (PRESERVISION AREDS PO) Take by mouth 2 (two) times daily.   omeprazole (PRILOSEC) 20 MG capsule Take 1 capsule (20 mg total) by mouth 2 (two) times daily before a meal.   pantoprazole (PROTONIX) 40 MG tablet Take 1 tablet (40 mg total) by mouth daily.  rosuvastatin (CRESTOR) 20 MG tablet TAKE 1 TABLET DAILY   sertraline (ZOLOFT) 100 MG tablet TAKE 1 TABLET DAILY   [DISCONTINUED] tetracycline (SUMYCIN) 500 MG capsule Take 1 capsule (500 mg total) by mouth 4 (four) times daily.   No facility-administered encounter medications on file as of 10/02/2022.    Surgical History: Past Surgical History:  Procedure Laterality Date   AUGMENTATION MAMMAPLASTY  1975   AUGMENTATION MAMMAPLASTY  2017   2nd  augmentation and lift   BIOPSY  07/11/2022   Procedure: BIOPSY;  Surgeon: Midge Minium, MD;  Location: [facility] ENDOSCOPY;  Service: Endoscopy;;   BREAST SURGERY     impants replaced with lift   CARPAL TUNNEL RELEASE Right 08/26/2014   Procedure: CARPAL TUNNEL RELEASE;  Surgeon: Deeann Saint, MD;  Location: [facility] ORS;  Service: Orthopedics;  Laterality: Right;   COLONOSCOPY  2005   Dr Lemar Livings   COLONOSCOPY N/A 06/24/2014   Procedure: COLONOSCOPY;  Surgeon: Earline Mayotte, MD;  Location: [facility] ENDOSCOPY;  Service: Endoscopy;  Laterality: N/A;   ESOPHAGOGASTRODUODENOSCOPY (EGD) WITH PROPOFOL N/A 07/11/2022   Procedure: ESOPHAGOGASTRODUODENOSCOPY (EGD) WITH PROPOFOL;  Surgeon: Midge Minium, MD;  Location: [facility] ENDOSCOPY;  Service: Endoscopy;  Laterality: N/A;   PARATHYROIDECTOMY  1992   PLACEMENT OF BREAST IMPLANTS  1975   TONSILECTOMY, ADENOIDECTOMY, BILATERAL MYRINGOTOMY AND TUBES      Medical History: 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HCC)     Family History: Non contributory to the present illness  Social History: Social History   Socioeconomic History   Marital status: Widowed    Spouse name: Not on file   Number of children: 3   Years of education: Not on file   Highest education level: Bachelor's degree (e.g., BA, AB, BS)  Occupational History   Occupation: retired  Tobacco Use   Smoking status: Never   Smokeless tobacco: Never  Vaping Use   Vaping status: Never Used  Substance and Sexual Activity   Alcohol use: Yes    Alcohol/week: 1.0 standard drink of alcohol     Types: 1 Glasses of wine per week   Drug use: No   Sexual activity: Not on file  Other Topics Concern   Not on file  Social History Narrative   Not on file   Social Determinants of Health   Financial Resource Strain: Low Risk  (07/19/2022)   Received from [facility]   Overall Financial Resource Strain (CARDIA)    Difficulty of Paying Living Expenses: Not hard at all  Food Insecurity: No Food Insecurity (07/19/2022)   Received from [facility]   Hunger Vital Sign    Worried About Running Out of Food in the Last Year: Never true    Ran Out of Food in the Last Year: Never true  Transportation Needs: No Transportation Needs (07/19/2022)   Received from [facility] - Transportation    In the past 12 months, has lack of transportation kept you from  medical appointments or from getting medications?: No    Lack of Transportation (Non-Medical): No  Physical Activity: Insufficiently Active (07/19/2022)   Received from [facility]   Exercise Vital Sign    Days of Exercise per Week: 3 days    Minutes of Exercise per Session: 30 min  Stress: No Stress Concern Present (02/22/2021)   [employer] of Occupational Health - Occupational Stress Questionnaire    Feeling of Stress : Not at all  Social Connections: Socially Isolated (02/22/2021)   Social Connection and Isolation Panel [NHANES]    Frequency of Communication with Friends and Family: More than three times a week    Frequency of Social Gatherings with Friends and Family: More than three times a week    Attends Religious Services: Never    Database administrator or Organizations: No    Attends Banker Meetings: Never    Marital Status: Widowed  Intimate Partner Violence: Not At Risk (02/22/2021)   Humiliation, Afraid, Rape, and Kick questionnaire    Fear of Current or Ex-Partner: No    Emotionally Abused: No    Physically Abused: No     Sexually Abused: No    Vital Signs: Blood pressure 136/72, pulse 74, resp. rate 14, height 5' (1.524 m), weight 142 lb (64.4 kg), SpO2 97%. Body mass index is 27.73 kg/m.   Examination: General Appearance: The patient is well-developed, well-nourished, and in no distress. Neck Circumference: 36 cm Skin: Gross inspection of skin unremarkable. Head: normocephalic, no gross deformities. Eyes: no gross deformities noted. ENT: ears appear grossly normal Neurologic: Alert and oriented. No involuntary movements.    STOP BANG RISK ASSESSMENT S (snore) Have you been told that you snore?     YES   T (tired) Are you often tired, fatigued, or sleepy during the day?   YES  O (obstruction) Do you stop breathing, choke, or gasp during sleep? NO   P (pressure) Do you have or are you being treated for high blood pressure? YES   B (BMI) Is your body index greater than 35 kg/m? NO   A (age) Are you 83 years old or older? YES   N (neck) Do you have a neck circumference greater than 16 inches?   NO   G (gender) Are you a female? NO   TOTAL STOP/BANG "YES" ANSWERS 4                                                               A STOP-Bang score of 2 or less is considered low risk, and a score of 5 or more is high risk for having either moderate or severe OSA. For people who score 3 or 4, doctors may need to perform further assessment to determine how likely they are to have OSA.         EPWORTH SLEEPINESS SCALE:  Scale:  (0)= no chance of dozing; (1)= slight chance of dozing; (2)= moderate chance of dozing; (3)= high chance of dozing  Chance  Situtation    Sitting and reading: 2    Watching TV: 1    Sitting Inactive in public: 0    As a passenger in car: 1      Lying down to rest: 1  Sitting and talking: 0    Sitting quielty after lunch: 1    In a car, stopped in traffic: 0   TOTAL SCORE:   6 out of 24    SLEEP STUDIES:  HST early this year at [facility] - moderate  apnea   LABS: Recent Results (from the past 2160 hour(s))  Glucose, capillary     Status: Abnormal   Collection Time: 07/11/22  9:05 AM  Result Value Ref Range   Glucose-Capillary 148 (H) 70 - 99 mg/dL    Comment: Glucose reference range applies only to samples taken after fasting for at least 8 hours.  H. pylori breath test     Status: None   Collection Time: 09/27/22  3:04 PM  Result Value Ref Range   H pylori Breath Test Negative Negative    Radiology: MR LUMBAR SPINE WO CONTRAST  Result Date: 09/23/2022 CLINICAL DATA:  Chronic low back pain for 20 years EXAM: MRI LUMBAR SPINE WITHOUT CONTRAST TECHNIQUE: Multiplanar, multisequence MR imaging of the lumbar spine was performed. No intravenous contrast was administered. COMPARISON:  None Available. FINDINGS: Segmentation: The lowest lumbar type non-rib-bearing vertebra is labeled as L5. Alignment: Prominent levoconvex lumbar scoliosis with rotary component. 3 mm degenerative retrolisthesis at L3-4. Vertebrae: Type 1 degenerative endplate findings at L2-3 and L3-4. Disc desiccation at all levels from T12 L5. Conus medullaris and cauda equina: Conus extends to the L1-2 level. Conus and cauda equina appear normal. Paraspinal and other soft tissues: Unremarkable Disc levels: T12-L1: No impingement.  Mild disc bulge. L1-2: Mild right foraminal stenosis and mild right subarticular lateral recess stenosis due to facet arthropathy and disc bulge. L2-3: Moderate right foraminal stenosis and mild right subarticular lateral recess stenosis due to intervertebral spurring, disc bulge, and facet arthropathy. L3-4: Moderate to prominent left and mild right foraminal stenosis and moderate central narrowing of the thecal sac along with moderate left subarticular lateral recess stenosis due to intervertebral and facet spurring. L4-5: Mild to moderate left foraminal stenosis due to facet and intervertebral spurring along with disc bulge. L5-S1: Mild left and  borderline right foraminal stenosis due to facet spurring. IMPRESSION: 1. Lumbar spondylosis and degenerative disc disease causing moderate to prominent impingement at L3-4; moderate impingement at L2-3; mild to moderate impingement at L4-5; and mild impingement L1-2 and L5-S1. 2. Prominent levoconvex lumbar scoliosis with rotary component. Electronically Signed   By: Gaylyn Rong M.D.   On: 09/23/2022 14:10    No results found.  MR LUMBAR SPINE WO CONTRAST  Result Date: 09/23/2022 CLINICAL DATA:  Chronic low back pain for 20 years EXAM: MRI LUMBAR SPINE WITHOUT CONTRAST TECHNIQUE: Multiplanar, multisequence MR imaging of the lumbar spine was performed. No intravenous contrast was administered. COMPARISON:  None Available. FINDINGS: Segmentation: The lowest lumbar type non-rib-bearing vertebra is labeled as L5. Alignment: Prominent levoconvex lumbar scoliosis with rotary component. 3 mm degenerative retrolisthesis at L3-4. Vertebrae: Type 1 degenerative endplate findings at L2-3 and L3-4. Disc desiccation at all levels from T12 L5. Conus medullaris and cauda equina: Conus extends to the L1-2 level. Conus and cauda equina appear normal. Paraspinal and other soft tissues: Unremarkable Disc levels: T12-L1: No impingement.  Mild disc bulge. L1-2: Mild right foraminal stenosis and mild right subarticular lateral recess stenosis due to facet arthropathy and disc bulge. L2-3: Moderate right foraminal stenosis and mild right subarticular lateral recess stenosis due to intervertebral spurring, disc bulge, and facet arthropathy. L3-4: Moderate to prominent left and mild right foraminal stenosis and moderate  central narrowing of the thecal sac along with moderate left subarticular lateral recess stenosis due to intervertebral and facet spurring. L4-5: Mild to moderate left foraminal stenosis due to facet and intervertebral spurring along with disc bulge. L5-S1: Mild left and borderline right foraminal stenosis due  to facet spurring. IMPRESSION: 1. Lumbar spondylosis and degenerative disc disease causing moderate to prominent impingement at L3-4; moderate impingement at L2-3; mild to moderate impingement at L4-5; and mild impingement L1-2 and L5-S1. 2. Prominent levoconvex lumbar scoliosis with rotary component. Electronically Signed   By: Gaylyn Rong M.D.   On: 09/23/2022 14:10      Assessment and Plan: Patient Active Problem List   Diagnosis Date Noted   Snoring 10/02/2022   Gastroesophageal reflux disease 07/11/2022   Acid indigestion 07/11/2022   Anterior knee pain 02/10/2022   Bursitis of hip 02/10/2022   Lipoma of foot 02/10/2022   Acute bacterial sinusitis 11/18/2021   Impingement syndrome of left shoulder region 02/25/2021   Premature ventricular contractions 08/09/2020   Heart palpitations 07/06/2020   Pure hypercholesterolemia 07/06/2020   Vaginal atrophy 08/16/2018   Rectocele 08/16/2018   Osteoarthritis of knee 10/10/2017   Scoliosis of lumbar spine 07/06/2016   Degeneration of lumbar intervertebral disc 07/06/2016   Cervical arthritis 07/06/2016   Arthralgia of multiple joints 11/12/2014   Allergic rhinitis 11/12/2014   Diabetes mellitus (HCC) 11/12/2014   Essential (primary) hypertension 11/12/2014   Personal history of other drug therapy 11/12/2014   HLD (hyperlipidemia) 11/12/2014   Adult hypothyroidism 11/12/2014   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 Snoring Patient evaluation suggests high risk of sleep disordered breathing due to history of a positive home sleep study (results not available) snoring, daytime sleepiness.  Patient has comorbid cardiovascular risk factors including: hypertension  which could be exacerbated by pathologic  sleep-disordered breathing.  Suggest: PSG  to assess/treat the patient's sleep disordered breathing. The patient was also counselled on weight loss to optimize sleep health.  2. Essential (primary) hypertension Hypertension Counseling:   The following hypertensive lifestyle modification were recommended and discussed:  1. Limiting alcohol intake to less than 1 oz/day of ethanol:(24 oz of beer or 8 oz of wine or 2 oz of 100-proof whiskey). 2. Take baby ASA 81 mg daily. 3. Importance of regular aerobic exercise and losing weight. 4. Reduce dietary saturated fat and cholesterol intake for overall cardiovascular health. 5. Maintaining adequate dietary potassium, calcium, and magnesium intake. 6. Regular monitoring of the blood pressure. 7. Reduce sodium intake to less than 100 mmol/day (less than 2.3 gm of sodium or less than 6 gm of sodium choride)       General Counseling: I have discussed the findings of the evaluation and examination with Enid Derry.  I have also discussed any further diagnostic evaluation thatmay be needed or ordered today. Tannah verbalizes understanding of the findings of todays visit. We also reviewed her medications today and discussed drug interactions and side effects including but not limited excessive drowsiness and altered mental states. We also discussed that there is always a risk not just to her but also people around her. she has been encouraged to call the office with any questions or concerns that should arise related to todays visit.  No orders of the defined types were placed  in this encounter.       I have personally obtained a history, evaluated the patient, evaluated pertinent data, formulated the assessment and plan and placed orders. This patient was seen today by Emmaline Kluver, PA-C in collaboration with Dr. Freda Munro.     Yevonne Pax, MD [facility] Diplomate ABMS Pulmonary and Critical Care Medicine Sleep medicine

## 2022-10-02 NOTE — Patient Instructions (Signed)

## 2022-10-18 DIAGNOSIS — G4733 Obstructive sleep apnea (adult) (pediatric): Secondary | ICD-10-CM | POA: Diagnosis not present

## 2022-10-30 ENCOUNTER — Ambulatory Visit: Payer: Medicare Other | Admitting: Urology

## 2022-10-30 ENCOUNTER — Encounter: Payer: Self-pay | Admitting: Urology

## 2022-10-30 VITALS — BP 122/71 | HR 82

## 2022-10-30 DIAGNOSIS — Z23 Encounter for immunization: Secondary | ICD-10-CM | POA: Diagnosis not present

## 2022-10-30 DIAGNOSIS — R350 Frequency of micturition: Secondary | ICD-10-CM

## 2022-10-30 LAB — URINALYSIS, COMPLETE
Bilirubin, UA: NEGATIVE
Glucose, UA: NEGATIVE
Ketones, UA: NEGATIVE
Leukocytes,UA: NEGATIVE
Nitrite, UA: NEGATIVE
Protein,UA: NEGATIVE
RBC, UA: NEGATIVE
Specific Gravity, UA: 1.01 (ref 1.005–1.030)
Urobilinogen, Ur: 0.2 mg/dL (ref 0.2–1.0)
pH, UA: 6.5 (ref 5.0–7.5)

## 2022-10-30 LAB — MICROSCOPIC EXAMINATION: Bacteria, UA: NONE SEEN

## 2022-10-30 MED ORDER — ESTRADIOL 0.1 MG/GM VA CREA: TOPICAL_CREAM | VAGINAL | 12 refills | Status: DC

## 2022-10-30 NOTE — Progress Notes (Signed)
10/30/2022 10:35 AM   Nicole Young 1940/04/13 [id number]  Referring provider: Bosie Clos, MD [address] [place],  [place] [address]  No chief complaint on file.   HPI: I was consulted to assess the patient's feeling of urgency and fullness.  Many weeks ago she was having a lot of burning and the need to get up 5-6 times a night.  She describes a negative culture and was put on estrogen cream.  Burning almost completely gone and now gets up twice  She feels this fullness like she needs to urinate.  She is continent unless she holds it a long time.  She has never smoked.  Flow varies.  She generally feels empty and double voids a small amount.  Sometimes she has a need to go and then only voids a small amount   Has not had a hysterectomy.  2 friends of had bladder cancer concerns     Patient is only getting up twice instead of 5 times.  Urgency much better.  Burning is gone.  She uses estradiol  and is also on the Gemtesa samples.  She wonders if the estradiol is helping a lot.   Well supported bladder neck.  No cystocele or stress incontinence.  Large grade 2 rectocele   Cystoscopy normal   think it is reasonable to stop the Gemtesa.  See if she does well on estradiol.  Reassess in 3 months.  Can always go back on overactive bladder medication   Today Frequency stable.  She only gets up once a night now.  Physical therapy helped a lot with some suggestions about toileting.  Doing well on estrogen.  No infections.   Estrogen renewed and I will see in 1 year  Today Patient went from 5 times a night to twice on estrogen.  She uses it twice a week and then a small amount almost nightly.  No infections.  Clinically not infected.  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[facility])     Surgical History: Past Surgical History:  Procedure Laterality Date   AUGMENTATION MAMMAPLASTY  1975   AUGMENTATION MAMMAPLASTY  2017   2nd augmentation and lift   BIOPSY  07/11/2022   Procedure: BIOPSY;  Surgeon: Midge Minium, MD;  Location: [facility] ENDOSCOPY;  Service: Endoscopy;;   BREAST SURGERY     impants replaced with lift   CARPAL TUNNEL RELEASE Right 08/26/2014   Procedure: CARPAL TUNNEL RELEASE;  Surgeon: Deeann Saint, MD;  Location: [facility] ORS;  Service: Orthopedics;  Laterality: Right;   COLONOSCOPY  2005   Dr Lemar Livings   COLONOSCOPY N/A 06/24/2014   Procedure: COLONOSCOPY;  Surgeon: Earline Mayotte, MD;  Location: [facility] ENDOSCOPY;  Service: Endoscopy;  Laterality: N/A;   ESOPHAGOGASTRODUODENOSCOPY (EGD) WITH PROPOFOL N/A 07/11/2022   Procedure: ESOPHAGOGASTRODUODENOSCOPY (EGD) WITH PROPOFOL;  Surgeon: Midge Minium, MD;  Location: [facility]COPY;  Service: Endoscopy;  Laterality: N/A;   PARATHYROIDECTOMY  1992  PLACEMENT OF BREAST IMPLANTS  1975   TONSILECTOMY, ADENOIDECTOMY, BILATERAL MYRINGOTOMY AND TUBES      Home Medications:  Allergies as of 10/30/2022       Reactions   Neomycin-polymyxin-gramicidin Other (See Comments)   Other Reaction: Other reaction   Clarithromycin Other (See Comments)   Dizziness   Farxiga [dapagliflozin] Other (See Comments)   Weak and dizzy    Neosporin [neomycin-bacitracin Zn-polymyx] Swelling   Sulfa Antibiotics Other (See Comments)   Thimerosal (thiomersal) Swelling   Zostavax  [zoster Vaccine Live] Rash   Do Not Administer Zostavax to Pt!        Medication List        Accurate as of October 30, 2022 10:35 AM. If you have any questions, ask your nurse or doctor.           Accu-Chek FastClix Lancets Misc CHECK SUGAR ONCE DAILY. DX: E11.9   Accu-Chek SmartView test strip Generic drug: glucose blood USE TO CHECK BLOOD SUGAR ONCE A DAY   bismuth subsalicylate 262 MG/15ML suspension Commonly known as: PEPTO BISMOL Take 30 mLs by mouth 4 (four) times daily.   Cheratussin AC 100-10 MG/5ML syrup Generic drug: guaiFENesin-codeine Take 5 mLs by mouth at bedtime as needed for cough (up to two doses daily).   cholecalciferol 25 MCG (1000 UNIT) tablet Commonly known as: VITAMIN D3 Take 600 Units by mouth daily.   diazepam 5 MG tablet Commonly known as: VALIUM Take by mouth.   estradiol 0.1 MG/GM vaginal cream Commonly known as: ESTRACE 1 gram intravaginally at bedtime daily for 2-3 weeks, then decrease to 3 times per week   famotidine 20 MG tablet Commonly known as: PEPCID Take 20 mg by mouth daily.   fluorouracil 5 % cream Commonly known as: EFUDEX Apply to affected wart at bedtime   hydroquinone 4 % cream Apply topically 2 (two) times daily.   levothyroxine 50 MCG tablet Commonly known as: SYNTHROID Take 1 tablet (50 mcg total) by mouth daily before breakfast.   losartan 25 MG tablet Commonly known as: COZAAR Take 1 tablet by mouth daily.   Magnesium 250 MG Tabs Take 1 tablet by mouth daily.   metFORMIN 500 MG 24 hr tablet Commonly known as: GLUCOPHAGE-XR TAKE 1 TABLET TWICE A DAY What changed:  how to take this when to take this   methocarbamol 500 MG tablet Commonly known as: ROBAXIN as needed.   metroNIDAZOLE 500 MG tablet Commonly known as: FLAGYL Take 1 tablet (500 mg total) by mouth 2 (two) times daily.   mometasone 0.1 % cream Commonly known as: ELOCON Apply 1 application topically See admin instructions. Use 1 - 2 times daily until clear, then use as a spot treatment when needed   MUCINEX PO Take by mouth.   multivitamin capsule Take 1 capsule by mouth daily.   omeprazole 20 MG capsule Commonly known as:  PRILOSEC Take 1 capsule (20 mg total) by mouth 2 (two) times daily before a meal.   pantoprazole 40 MG tablet Commonly known as: PROTONIX Take 1 tablet (40 mg total) by mouth daily.   PRESERVISION AREDS PO Take by mouth 2 (two) times daily.   rosuvastatin 20 MG tablet Commonly known as: CRESTOR TAKE 1 TABLET DAILY   sertraline 100 MG tablet Commonly known as: ZOLOFT TAKE 1 TABLET DAILY        Allergies:  Allergies  Allergen Reactions   Neomycin-Polymyxin-Gramicidin Other (See Comments)    Other Reaction: Other reaction   Clarithromycin Other (  See Comments)    Dizziness   Farxiga [Dapagliflozin] Other (See Comments)    Weak and dizzy    Neosporin [Neomycin-Bacitracin Zn-Polymyx] Swelling   Sulfa Antibiotics Other (See Comments)   Thimerosal (Thiomersal) Swelling   Zostavax  [Zoster Vaccine Live] Rash    Do Not Administer Zostavax to Pt!    Family History: Family History  Problem Relation Age of Onset   Cancer Father        prostate   Prostate cancer Father    Hypertension Mother    Heart attack Mother    Heart attack Maternal Grandfather    Diabetes Paternal Uncle    Congestive Heart Failure Maternal Grandmother    CVA Paternal Grandmother    Prostate cancer Paternal Grandfather    Breast cancer Maternal Aunt     Social History:  reports that she has never smoked. She has never used smokeless tobacco. She reports current alcohol use of about 1.0 standard drink of alcohol per week. She reports that she does not use drugs.  ROS:                                        Physical Exam: There were no vitals taken for this visit.  Constitutional:  Alert and oriented, No acute distress.   Laboratory Data: Lab Results  Component Value Date   WBC 9.1 09/29/2021   HGB 13.4 09/29/2021   HCT 40.4 09/29/2021   MCV 90 09/29/2021   PLT 218 09/29/2021    Lab Results  Component Value Date   CREATININE 0.69 09/29/2021    No results  found for: "PSA"  No results found for: "TESTOSTERONE"  Lab Results  Component Value Date   HGBA1C 6.1 (A) 09/06/2020    Urinalysis    Component Value Date/Time   APPEARANCEUR Clear 10/31/2021 0954   GLUCOSEU Negative 10/31/2021 0954   BILIRUBINUR Negative 10/31/2021 0954   KETONESUR negative 03/31/2021 1220   PROTEINUR Negative 10/31/2021 0954   UROBILINOGEN 0.2 03/31/2021 1220   NITRITE Negative 10/31/2021 0954   LEUKOCYTESUR Negative 10/31/2021 0954    Pertinent Imaging:   Assessment & Plan: Urine sent for culture.  Estrogen renewed.  I will see in 1 year.  Call if culture positive  1. Frequent urination  - Urinalysis, Complete   No follow-ups on file.  Martina Sinner, MD  [facility] [address] [place], [place] [address] [phone]

## 2022-11-02 LAB — CULTURE, URINE COMPREHENSIVE

## 2022-11-03 DIAGNOSIS — G4733 Obstructive sleep apnea (adult) (pediatric): Secondary | ICD-10-CM | POA: Diagnosis not present

## 2022-11-21 DIAGNOSIS — H353221 Exudative age-related macular degeneration, left eye, with active choroidal neovascularization: Secondary | ICD-10-CM | POA: Diagnosis not present

## 2022-11-22 DIAGNOSIS — F320 Major depressive disorder, single episode, mild: Secondary | ICD-10-CM | POA: Diagnosis not present

## 2022-11-22 DIAGNOSIS — H353220 Exudative age-related macular degeneration, left eye, stage unspecified: Secondary | ICD-10-CM | POA: Diagnosis not present

## 2022-11-22 DIAGNOSIS — M419 Scoliosis, unspecified: Secondary | ICD-10-CM | POA: Diagnosis not present

## 2022-11-22 DIAGNOSIS — E7800 Pure hypercholesterolemia, unspecified: Secondary | ICD-10-CM | POA: Diagnosis not present

## 2022-11-22 DIAGNOSIS — E039 Hypothyroidism, unspecified: Secondary | ICD-10-CM | POA: Diagnosis not present

## 2022-11-22 DIAGNOSIS — I10 Essential (primary) hypertension: Secondary | ICD-10-CM | POA: Diagnosis not present

## 2022-11-22 DIAGNOSIS — E119 Type 2 diabetes mellitus without complications: Secondary | ICD-10-CM | POA: Diagnosis not present

## 2022-11-24 DIAGNOSIS — D485 Neoplasm of uncertain behavior of skin: Secondary | ICD-10-CM | POA: Diagnosis not present

## 2022-11-28 DIAGNOSIS — Z23 Encounter for immunization: Secondary | ICD-10-CM | POA: Diagnosis not present

## 2022-12-23 ENCOUNTER — Other Ambulatory Visit: Payer: Self-pay | Admitting: Family Medicine

## 2022-12-23 DIAGNOSIS — E039 Hypothyroidism, unspecified: Secondary | ICD-10-CM

## 2023-01-10 DIAGNOSIS — K828 Other specified diseases of gallbladder: Secondary | ICD-10-CM

## 2023-01-10 DIAGNOSIS — K8020 Calculus of gallbladder without cholecystitis without obstruction: Secondary | ICD-10-CM

## 2023-01-10 HISTORY — DX: Other specified diseases of gallbladder: K82.8

## 2023-01-10 HISTORY — DX: Calculus of gallbladder without cholecystitis without obstruction: K80.20

## 2023-01-12 NOTE — Progress Notes (Signed)
 [facility] [address] [place], [place] [address]  Pulmonary Sleep Medicine   Office Visit Note  Patient Name: Nicole Young DOB: 02/20/1940 MRN [id number]    Chief Complaint: Obstructive Sleep Apnea visit  Brief History:  Nicole Young is seen today for a follow up visit for CPAP@ 5 cmH2O. The patient has a 3 month history of sleep apnea. Patient is using PAP nightly.  The patient feels rested after sleeping with PAP.  The patient reports benefiting from PAP use. Reported sleepiness is  improved and the Epworth Sleepiness Score is 8 out of 24. The patient will occasionally take naps. The patient complains of the following: pt is complaining of skin itch where the seal sits on her face. Discussed used of barrier.  The compliance download shows 97% compliance with an average use time of 7 hours 15 minutes. The AHI is 0.6.  The patient does complain of limb movements disrupting sleep. The patient continues to require PAP therapy in order to eliminate sleep apnea. She does admit to changing her pressure from 5 to 7 and feels better.   ROS  General: (-) fever, (-) chills, (-) night sweat Nose and Sinuses: (-) nasal stuffiness or itchiness, (-) postnasal drip, (-) nosebleeds, (-) sinus trouble. Mouth and Throat: (-) sore throat, (-) hoarseness. Neck: (-) swollen glands, (-) enlarged thyroid , (-) neck pain. Respiratory: - cough, - shortness of breath, - wheezing. Neurologic: - numbness, - tingling. Psychiatric: +anxiety, + depression   Current Medication: Outpatient Encounter Medications as of 01/15/2023  Medication Sig   pantoprazole  (PROTONIX ) 20 MG tablet Take by mouth.   Accu-Chek FastClix Lancets MISC CHECK SUGAR ONCE DAILY. DX: E11.9   ACCU-CHEK SMARTVIEW test strip USE TO CHECK BLOOD SUGAR ONCE A DAY   Blood Glucose Monitoring Suppl (ACCU-CHEK GUIDE ME) w/Device KIT USE DAILY AS DIRECTED TO MONITOR BLOOD SUGAR   cholecalciferol (VITAMIN D3) 25 MCG (1000 UT) tablet Take  600 Units by mouth daily.   Cysteamine Bitartrate (PROCYSBI) 300 MG PACK Use daily as directed to monitor blood sugar   diazepam  (VALIUM ) 5 MG tablet Take by mouth.   estradiol  (ESTRACE ) 0.1 MG/GM vaginal cream 1 gram intravaginally at bedtime daily for 2-3 weeks, then decrease to 3 times per week   famotidine  (PEPCID ) 20 MG tablet Take 20 mg by mouth daily.   fluorouracil  (EFUDEX ) 5 % cream Apply to affected wart at bedtime   hydroquinone  4 % cream Apply topically 2 (two) times daily.   levothyroxine  (SYNTHROID ) 50 MCG tablet Take 1 tablet (50 mcg total) by mouth daily before breakfast.   losartan  (COZAAR ) 25 MG tablet Take 1 tablet by mouth daily.   Magnesium 250 MG TABS Take 1 tablet by mouth daily.   metFORMIN  (GLUCOPHAGE -XR) 500 MG 24 hr tablet TAKE 1 TABLET TWICE A DAY (Patient taking differently: 500 mg 2 (two) times daily before a meal.)   methocarbamol (ROBAXIN) 500 MG tablet as needed.   mometasone  (ELOCON ) 0.1 % cream Apply 1 application topically See admin instructions. Use 1 - 2 times daily until clear, then use as a spot treatment when needed   Multiple Vitamin (MULTIVITAMIN) capsule Take 1 capsule by mouth daily.   Multiple Vitamins-Minerals (PRESERVISION AREDS PO) Take by mouth 2 (two) times daily.   rosuvastatin  (CRESTOR ) 20 MG tablet TAKE 1 TABLET DAILY   sertraline  (ZOLOFT ) 100 MG tablet TAKE 1 TABLET DAILY   [DISCONTINUED] pantoprazole  (PROTONIX ) 40 MG tablet Take 1 tablet (40 mg total) by mouth daily.  No facility-administered encounter medications on file as of 01/15/2023.    Surgical History: Past Surgical History:  Procedure Laterality Date   AUGMENTATION MAMMAPLASTY  1975   AUGMENTATION MAMMAPLASTY  2017   2nd augmentation and lift   BIOPSY  07/11/2022   Procedure: BIOPSY;  Surgeon: Jinny Carmine, MD;  Location: [facility] ENDOSCOPY;  Service: Endoscopy;;   BREAST SURGERY     impants replaced with lift   CARPAL TUNNEL RELEASE Right 08/26/2014   Procedure: CARPAL TUNNEL  RELEASE;  Surgeon: Kayla Pinal, MD;  Location: [facility] ORS;  Service: Orthopedics;  Laterality: Right;   COLONOSCOPY  2005   Dr Dessa   COLONOSCOPY N/A 06/24/2014   Procedure: COLONOSCOPY;  Surgeon: Reyes LELON Dessa, MD;  Location: [facility] ENDOSCOPY;  Service: Endoscopy;  Laterality: N/A;   ESOPHAGOGASTRODUODENOSCOPY (EGD) WITH PROPOFOL  N/A 07/11/2022   Procedure: ESOPHAGOGASTRODUODENOSCOPY (EGD) WITH PROPOFOL ;  Surgeon: Jinny Carmine, MD;  Location: [facility] ENDOSCOPY;  Service: Endoscopy;  Laterality: N/A;   PARATHYROIDECTOMY  1992   PLACEMENT OF BREAST IMPLANTS  1975   TONSILECTOMY, ADENOIDECTOMY, BILATERAL MYRINGOTOMY AND TUBES      Medical History: Past Medical History:  Diagnosis Date   AMD (acid maltase deficiency) (HCC)    Complication of anesthesia    slow to wake up in past   Depression    controlled   Diabetes mellitus without complication (HCC)    controlled   Hyperlipidemia    Hypertension    Hypothyroidism    Neuropathy    Osteoarthritis    knee, ankles, shoulders, back, neck, hands/fingers, wrists;    Rectocele    Thyroid  disease    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HCC)     Family History: Non contributory to the present illness  Social History: Social History   Socioeconomic History   Marital status: Widowed    Spouse name: Not on file   Number of children: 3   Years of education: Not on file   Highest education level: Bachelor's degree (e.g., BA, AB, BS)  Occupational History   Occupation: retired  Tobacco Use   Smoking status: Never   Smokeless tobacco: Never  Vaping Use   Vaping status: Never Used  Substance and Sexual Activity   Alcohol  use: Yes    Alcohol /week: 1.0 standard drink of alcohol     Types: 1 Glasses of wine per week   Drug use: No   Sexual activity: Not on file  Other Topics Concern   Not on file  Social History Narrative   Not on file   Social Drivers of Health   Financial Resource Strain: Low Risk  (07/19/2022)   Received from  [facility]   Overall Financial Resource Strain (CARDIA)    Difficulty of Paying Living Expenses: Not hard at all  Food Insecurity: No Food Insecurity (07/19/2022)   Received from [facility]   Hunger Vital Sign    Worried About Running Out of Food in the Last Year: Never true    Ran Out of Food in the Last Year: Never true  Transportation Needs: No Transportation Needs (07/19/2022)   Received from [facility] - Transportation    In the past 12 months, has lack of transportation kept you from medical appointments or from getting medications?: No    Lack of Transportation (Non-Medical): No  Physical Activity: Insufficiently Active (07/19/2022)   Received from [facility]   Exercise Vital Sign    Days of Exercise per Week:  3 days    Minutes of Exercise per Session: 30 min  Stress: No Stress Concern Present (02/22/2021)   [employer] of Occupational Health - Occupational Stress Questionnaire    Feeling of Stress : Not at all  Social Connections: Socially Isolated (02/22/2021)   Social Connection and Isolation Panel [NHANES]    Frequency of Communication with Friends and Family: More than three times a week    Frequency of Social Gatherings with Friends and Family: More than three times a week    Attends Religious Services: Never    Database Administrator or Organizations: No    Attends Banker Meetings: Never    Marital Status: Widowed  Intimate Partner Violence: Not At Risk (02/22/2021)   Humiliation, Afraid, Rape, and Kick questionnaire    Fear of Current or Ex-Partner: No    Emotionally Abused: No    Physically Abused: No    Sexually Abused: No    Vital Signs: Blood pressure 126/68, pulse 76, resp. rate 16, height 5' (1.524 m), weight 148 lb (67.1 kg), SpO2 98%. Body mass index is 28.9 kg/m.    Examination: General Appearance: The patient is well-developed, well-nourished, and in  no distress. Neck Circumference: 36 cm Skin: Gross inspection of skin unremarkable. Head: normocephalic, no gross deformities. Eyes: no gross deformities noted. ENT: ears appear grossly normal Neurologic: Alert and oriented. No involuntary movements.  STOP BANG RISK ASSESSMENT S (snore) Have you been told that you snore?     NO   T (tired) Are you often tired, fatigued, or sleepy during the day?   NO  O (obstruction) Do you stop breathing, choke, or gasp during sleep? NO   P (pressure) Do you have or are you being treated for high blood pressure? YES   B (BMI) Is your body index greater than 35 kg/m? NO   A (age) Are you 31 years old or older? YES   N (neck) Do you have a neck circumference greater than 16 inches?   NO   G (gender) Are you a female? NO   TOTAL STOP/BANG "YES" ANSWERS 2       A STOP-Bang score of 2 or less is considered low risk, and a score of 5 or more is high risk for having either moderate or severe OSA. For people who score 3 or 4, doctors may need to perform further assessment to determine how likely they are to have OSA.         EPWORTH SLEEPINESS SCALE:  Scale:  (0)= no chance of dozing; (1)= slight chance of dozing; (2)= moderate chance of dozing; (3)= high chance of dozing  Chance  Situtation    Sitting and reading: 1    Watching TV: 2    Sitting Inactive in public: 1    As a passenger in car: 1      Lying down to rest: 1    Sitting and talking: 0    Sitting quielty after lunch: 2    In a car, stopped in traffic: 0   TOTAL SCORE:   8 out of 24    SLEEP STUDIES:  PSG (10/2022) AHI 7.3/hr, REM AHI 54.3/hr,  min SpO2 82% Titration (10/2022) CPAP@ 5 cmH2O   CPAP COMPLIANCE DATA:  Date Range: 11/28/2022-12/27/2022  Average Daily Use: 7 hours 15 minutes  Median Use: 7 hours 32 minutes  Compliance for > 4 Hours: 97%  AHI: 0.6 respiratory events per hour  Days Used: 29/30 days  Mask Leak: 2  95th Percentile Pressure:  5         LABS: Recent Results (from the past 2160 hours)  Urinalysis, Complete     Status: None   Collection Time: 10/30/22 10:38 AM  Result Value Ref Range   Specific Gravity, UA 1.010 1.005 - 1.030   pH, UA 6.5 5.0 - 7.5   Color, UA Yellow Yellow   Appearance Ur Clear Clear   Leukocytes,UA Negative Negative   Protein,UA Negative Negative/Trace   Glucose, UA Negative Negative   Ketones, UA Negative Negative   RBC, UA Negative Negative   Bilirubin, UA Negative Negative   Urobilinogen, Ur 0.2 0.2 - 1.0 mg/dL   Nitrite, UA Negative Negative   Microscopic Examination See below:   Microscopic Examination     Status: None   Collection Time: 10/30/22 10:38 AM   Urine  Result Value Ref Range   WBC, UA 0-5 0 - 5 /hpf   RBC, Urine 0-2 0 - 2 /hpf   Epithelial Cells (non renal) 0-10 0 - 10 /hpf   Bacteria, UA None seen None seen/Few  CULTURE, URINE COMPREHENSIVE     Status: None   Collection Time: 10/30/22 11:25 AM   Specimen: Urine   UR  Result Value Ref Range   Urine Culture, Comprehensive Final report    Organism ID, Bacteria Comment     Comment: Mixed urogenital flora 10,000-25,000 colony forming units per mL     Radiology: MR LUMBAR SPINE WO CONTRAST Result Date: 09/23/2022 CLINICAL DATA:  Chronic low back pain for 20 years EXAM: MRI LUMBAR SPINE WITHOUT CONTRAST TECHNIQUE: Multiplanar, multisequence MR imaging of the lumbar spine was performed. No intravenous contrast was administered. COMPARISON:  None Available. FINDINGS: Segmentation: The lowest lumbar type non-rib-bearing vertebra is labeled as L5. Alignment: Prominent levoconvex lumbar scoliosis with rotary component. 3 mm degenerative retrolisthesis at L3-4. Vertebrae: Type 1 degenerative endplate findings at L2-3 and L3-4. Disc desiccation at all levels from T12 L5. Conus medullaris and cauda equina: Conus extends to the L1-2 level. Conus and cauda equina appear normal. Paraspinal and other soft tissues:  Unremarkable Disc levels: T12-L1: No impingement.  Mild disc bulge. L1-2: Mild right foraminal stenosis and mild right subarticular lateral recess stenosis due to facet arthropathy and disc bulge. L2-3: Moderate right foraminal stenosis and mild right subarticular lateral recess stenosis due to intervertebral spurring, disc bulge, and facet arthropathy. L3-4: Moderate to prominent left and mild right foraminal stenosis and moderate central narrowing of the thecal sac along with moderate left subarticular lateral recess stenosis due to intervertebral and facet spurring. L4-5: Mild to moderate left foraminal stenosis due to facet and intervertebral spurring along with disc bulge. L5-S1: Mild left and borderline right foraminal stenosis due to facet spurring. IMPRESSION: 1. Lumbar spondylosis and degenerative disc disease causing moderate to prominent impingement at L3-4; moderate impingement at L2-3; mild to moderate impingement at L4-5; and mild impingement L1-2 and L5-S1. 2. Prominent levoconvex lumbar scoliosis with rotary component. Electronically Signed   By: Ryan Salvage M.D.   On: 09/23/2022 14:10    No results found.  No results found.    Assessment and Plan: Patient Active Problem List   Diagnosis Date Noted   OSA (obstructive sleep apnea) 01/15/2023   Iron deficiency 01/15/2023   Snoring 10/02/2022   Gastroesophageal reflux disease 07/11/2022   Acid indigestion 07/11/2022   Anterior knee pain 02/10/2022   Bursitis of hip 02/10/2022   Lipoma of foot 02/10/2022  Acute bacterial sinusitis 11/18/2021   Impingement syndrome of left shoulder region 02/25/2021   Premature ventricular contractions 08/09/2020   Heart palpitations 07/06/2020   Pure hypercholesterolemia 07/06/2020   Vaginal atrophy 08/16/2018   Rectocele 08/16/2018   Osteoarthritis of knee 10/10/2017   Scoliosis of lumbar spine 07/06/2016   Degeneration of lumbar intervertebral disc 07/06/2016   Cervical arthritis  07/06/2016   Arthralgia of multiple joints 11/12/2014   Allergic rhinitis 11/12/2014   Diabetes mellitus (HCC) 11/12/2014   Essential (primary) hypertension 11/12/2014   Personal history of other drug therapy 11/12/2014   HLD (hyperlipidemia) 11/12/2014   Adult hypothyroidism 11/12/2014   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 OSA (obstructive sleep apnea) (Primary)  The patient does tolerate PAP and reports  benefit from PAP use. The patient was reminded how to clean equipment and advised to replace supplies routinely. The patient was also counselled on weight loss. The compliance is exellent. The AHI is 0.6.   OSA on cpap- controlled. Continue with excellent compliance with pap. CPAP continues to be medically necessary to treat this patient's OSA. F/u 53m  2. Restless leg Will check iron levels in light of symptoms  3. Iron deficiency Will check levels - Fe+TIBC+Fer     General Counseling: I have discussed the findings of the evaluation and examination with [facility].  I have also discussed any further diagnostic evaluation thatmay be needed or ordered today. Ethan verbalizes understanding of the findings of todays visit. We also reviewed her medications today and discussed drug interactions and side effects including but not limited excessive drowsiness and altered mental states. We also discussed that there is always a risk not just to her but also people around her. she has been encouraged to call the office with any questions or concerns that should arise related to todays visit.  Orders Placed This Encounter  Procedures   Fe+TIBC+Fer        I have personally obtained a history, examined the patient, evaluated laboratory and imaging results, formulated the  assessment and plan and placed orders. This patient was seen today by Lauraine Lay, PA-C in collaboration with Dr. Elfreda Bathe.   Elfreda DELENA Bathe, MD [facility] Diplomate ABMS Pulmonary Critical Care Medicine and Sleep Medicine

## 2023-01-15 ENCOUNTER — Ambulatory Visit (INDEPENDENT_AMBULATORY_CARE_PROVIDER_SITE_OTHER): Payer: Medicare Other | Admitting: Internal Medicine

## 2023-01-15 VITALS — BP 126/68 | HR 76 | Resp 16 | Ht 60.0 in | Wt 148.0 lb

## 2023-01-15 DIAGNOSIS — G2581 Restless legs syndrome: Secondary | ICD-10-CM | POA: Diagnosis not present

## 2023-01-15 DIAGNOSIS — G4733 Obstructive sleep apnea (adult) (pediatric): Secondary | ICD-10-CM

## 2023-01-15 DIAGNOSIS — E611 Iron deficiency: Secondary | ICD-10-CM | POA: Diagnosis not present

## 2023-01-15 HISTORY — DX: Iron deficiency: E61.1

## 2023-01-15 NOTE — Patient Instructions (Signed)

## 2023-01-17 LAB — IRON,TIBC AND FERRITIN PANEL
Ferritin: 78 ng/mL (ref 15–150)
Iron Saturation: 28 % (ref 15–55)
Iron: 96 ug/dL (ref 27–139)
Total Iron Binding Capacity: 339 ug/dL (ref 250–450)
UIBC: 243 ug/dL (ref 118–369)

## 2023-01-22 ENCOUNTER — Other Ambulatory Visit: Payer: Self-pay | Admitting: Family Medicine

## 2023-01-22 DIAGNOSIS — Z1231 Encounter for screening mammogram for malignant neoplasm of breast: Secondary | ICD-10-CM

## 2023-01-23 ENCOUNTER — Encounter: Payer: Self-pay | Admitting: Podiatry

## 2023-01-23 ENCOUNTER — Ambulatory Visit (INDEPENDENT_AMBULATORY_CARE_PROVIDER_SITE_OTHER): Payer: Medicare Other | Admitting: Podiatry

## 2023-01-23 VITALS — Ht 60.0 in | Wt 148.0 lb

## 2023-01-23 DIAGNOSIS — M79674 Pain in right toe(s): Secondary | ICD-10-CM | POA: Diagnosis not present

## 2023-01-23 DIAGNOSIS — B351 Tinea unguium: Secondary | ICD-10-CM | POA: Diagnosis not present

## 2023-01-23 DIAGNOSIS — E119 Type 2 diabetes mellitus without complications: Secondary | ICD-10-CM | POA: Diagnosis not present

## 2023-01-23 DIAGNOSIS — M79675 Pain in left toe(s): Secondary | ICD-10-CM

## 2023-01-23 NOTE — Progress Notes (Signed)
   Chief Complaint  Patient presents with   Diabetes    Patient is here for [facility] ALLERGIC TO NEOSPORIN    SUBJECTIVE Patient with a history of diabetes mellitus presents to office today complaining of elongated, thickened nails that cause pain while ambulating in shoes.  Patient is unable to trim their own nails. Patient is here for further evaluation and treatment.  Past Medical History:  Diagnosis Date   AMD (acid maltase deficiency) (HCC)    Complication of anesthesia    slow to wake up in past   Depression    controlled   Diabetes mellitus without complication (HCC)    controlled   Hyperlipidemia    Hypertension    Hypothyroidism    Neuropathy    Osteoarthritis    knee, ankles, shoulders, back, neck, hands/fingers, wrists;    Rectocele    Thyroid  disease    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HCC)     Allergies  Allergen Reactions   Neomycin-Polymyxin-Gramicidin Other (See Comments)    Other Reaction: Other reaction   Clarithromycin  Other (See Comments)    Dizziness   Neosporin [Neomycin-Bacitracin Zn-Polymyx] Swelling   Sulfa Antibiotics Other (See Comments)   Thimerosal (Thiomersal) Swelling   Zostavax  [Zoster Vaccine Live] Rash    Do Not Administer Zostavax to Pt!     OBJECTIVE General Patient is awake, alert, and oriented x 3 and in no acute distress. Derm Skin is dry and supple bilateral. Negative open lesions or macerations. Remaining integument unremarkable. Nails are tender, long, thickened and dystrophic with subungual debris, consistent with onychomycosis, 1-5 bilateral. No signs of infection noted. Vasc  DP and PT pedal pulses palpable bilaterally. Temperature gradient within normal limits.  Neuro Epicritic and protective threshold sensation diminished bilaterally.  Musculoskeletal Exam No symptomatic pedal deformities noted bilateral. Muscular strength within normal limits.  ASSESSMENT 1. Diabetes Mellitus w/ uncomplicated, controlled 2.  Pain due to  onychomycosis of toenails bilateral  PLAN OF CARE 1. Patient evaluated today.  Comprehensive diabetic foot exam performed today 2. Instructed to maintain good pedal hygiene and foot care. Stressed importance of controlling blood sugar.  3. Mechanical debridement of nails 1-5 bilaterally performed using a nail nipper. Filed with dremel without incident.  4. Return to clinic in 3 mos.     Thresa EMERSON Sar, DPM [facility]  Dr. Thresa EMERSON Sar, DPM    [address] [place], [place] [address]                Office [phone]  Fax [phone]

## 2023-01-25 ENCOUNTER — Ambulatory Visit
Admission: RE | Admit: 2023-01-25 | Discharge: 2023-01-25 | Disposition: A | Discharge status: Home / Self Care | Payer: Medicare Other | Source: Ambulatory Visit | Attending: Family Medicine | Admitting: Family Medicine

## 2023-01-25 DIAGNOSIS — Z1231 Encounter for screening mammogram for malignant neoplasm of breast: Secondary | ICD-10-CM | POA: Diagnosis present

## 2023-02-08 ENCOUNTER — Telehealth: Payer: Self-pay | Admitting: Gastroenterology

## 2023-02-08 NOTE — Telephone Encounter (Signed)
Patient called in to schedule her appointment for Abdominal pain, vomiting and she is schedule with Dr.Anna.

## 2023-02-12 ENCOUNTER — Encounter: Payer: Self-pay | Admitting: Internal Medicine

## 2023-02-12 ENCOUNTER — Ambulatory Visit (INDEPENDENT_AMBULATORY_CARE_PROVIDER_SITE_OTHER): Payer: Medicare Other | Admitting: Internal Medicine

## 2023-02-12 VITALS — BP 120/64 | HR 102 | Ht 60.0 in | Wt 143.0 lb

## 2023-02-12 DIAGNOSIS — G2581 Restless legs syndrome: Secondary | ICD-10-CM

## 2023-02-12 DIAGNOSIS — E1159 Type 2 diabetes mellitus with other circulatory complications: Secondary | ICD-10-CM

## 2023-02-12 DIAGNOSIS — M2550 Pain in unspecified joint: Secondary | ICD-10-CM

## 2023-02-12 DIAGNOSIS — R1010 Upper abdominal pain, unspecified: Secondary | ICD-10-CM

## 2023-02-12 DIAGNOSIS — K219 Gastro-esophageal reflux disease without esophagitis: Secondary | ICD-10-CM

## 2023-02-12 DIAGNOSIS — I152 Hypertension secondary to endocrine disorders: Secondary | ICD-10-CM

## 2023-02-12 DIAGNOSIS — E039 Hypothyroidism, unspecified: Secondary | ICD-10-CM

## 2023-02-12 DIAGNOSIS — E119 Type 2 diabetes mellitus without complications: Secondary | ICD-10-CM

## 2023-02-12 DIAGNOSIS — G4733 Obstructive sleep apnea (adult) (pediatric): Secondary | ICD-10-CM

## 2023-02-12 DIAGNOSIS — E1169 Type 2 diabetes mellitus with other specified complication: Secondary | ICD-10-CM | POA: Insufficient documentation

## 2023-02-12 DIAGNOSIS — Z1211 Encounter for screening for malignant neoplasm of colon: Secondary | ICD-10-CM

## 2023-02-12 DIAGNOSIS — E782 Mixed hyperlipidemia: Secondary | ICD-10-CM

## 2023-02-12 LAB — POCT CBG (FASTING - GLUCOSE)-MANUAL ENTRY: Glucose Fasting, POC: 159 mg/dL — AB (ref 70–99)

## 2023-02-12 NOTE — Progress Notes (Signed)
New Patient Office Visit  Subjective   Patient ID: Nicole Young, female    DOB: [birth year]  Age: 83 y.o. MRN: [id number]  CC:  Chief Complaint  Patient presents with   Establish Care    NPE    HPI Nicole Young presents to establish care Previous Primary Care provider/office:   she does have additional concerns to discuss today.   Patient comes in to establish PMD.  She has several medical problems but they are all being well-controlled by her specialists.  She has recently been started on CPAP therapy for her obstructive sleep apnea and she is tolerating it well, and also feels very good since she has been using it regularly.  However her most recent complaint of upper abdominal pain, low appetite and recent weight loss.  She already has an appointment to see her gastroenterologist.  She does have a history of GERD and hiatal hernia, however she decided to stop her PPI and H2 blockers.  Does not have any diarrhea or constipation, gets regular bowel movements which are not black.  Has a personal history of colon polyps, last colonoscopy was negative.  Will get a Cologuard. Also will check lipase and schedule CT abdomen.    Outpatient Encounter Medications as of 02/12/2023  Medication Sig   Accu-Chek FastClix Lancets MISC CHECK SUGAR ONCE DAILY. DX: E11.9   ACCU-CHEK SMARTVIEW test strip USE TO CHECK BLOOD SUGAR ONCE A DAY   cholecalciferol (VITAMIN D3) 25 MCG (1000 UT) tablet Take 600 Units by mouth daily.   diazepam (VALIUM) 5 MG tablet Take by mouth.   estradiol (ESTRACE) 0.1 MG/GM vaginal cream 1 gram intravaginally at bedtime daily for 2-3 weeks, then decrease to 3 times per week   levothyroxine (SYNTHROID) 50 MCG tablet Take 1 tablet (50 mcg total) by mouth daily before breakfast.   losartan (COZAAR) 25 MG tablet Take 1 tablet by mouth daily.   Magnesium 250 MG TABS Take 1 tablet by mouth daily.   metFORMIN (GLUCOPHAGE-XR) 500 MG 24 hr tablet TAKE 1 TABLET TWICE A DAY  (Patient taking differently: 500 mg 2 (two) times daily before a meal.)   rosuvastatin (CRESTOR) 20 MG tablet TAKE 1 TABLET DAILY   sertraline (ZOLOFT) 100 MG tablet TAKE 1 TABLET DAILY (Patient taking differently: 50 mg. TAKE 1 TABLET DAILY)   Blood Glucose Monitoring Suppl (ACCU-CHEK GUIDE ME) w/Device KIT USE DAILY AS DIRECTED TO MONITOR BLOOD SUGAR (Patient not taking: Reported on 02/12/2023)   Cysteamine Bitartrate (PROCYSBI) 300 MG PACK Use daily as directed to monitor blood sugar (Patient not taking: Reported on 02/12/2023)   famotidine (PEPCID) 20 MG tablet Take 20 mg by mouth daily. (Patient not taking: Reported on 02/12/2023)   fluorouracil (EFUDEX) 5 % cream Apply to affected wart at bedtime (Patient not taking: Reported on 02/12/2023)   hydroquinone 4 % cream Apply topically 2 (two) times daily. (Patient not taking: Reported on 02/12/2023)   methocarbamol (ROBAXIN) 500 MG tablet as needed. (Patient not taking: Reported on 02/12/2023)   mometasone (ELOCON) 0.1 % cream Apply 1 application topically See admin instructions. Use 1 - 2 times daily until clear, then use as a spot treatment when needed (Patient not taking: Reported on 02/12/2023)   Multiple Vitamin (MULTIVITAMIN) capsule Take 1 capsule by mouth daily. (Patient not taking: Reported on 02/12/2023)   Multiple Vitamins-Minerals (PRESERVISION AREDS PO) Take by mouth 2 (two) times daily. (Patient not taking: Reported on 02/12/2023)   pantoprazole (PROTONIX) 20 MG tablet  Take by mouth. (Patient not taking: Reported on 02/12/2023)   No facility-administered encounter medications on file as of 02/12/2023.    Past Medical History:  Diagnosis Date   AMD (acid maltase deficiency) (HCC)    Complication of anesthesia    slow to wake up in past   Depression    controlled   Diabetes mellitus without complication (HCC)    controlled   Hyperlipidemia    Hypertension    Hypothyroidism    Neuropathy    Osteoarthritis    knee, ankles, shoulders, back, neck,  hands/fingers, wrists;    Rectocele    Thyroid disease    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[facility])     Past Surgical History:  Procedure Laterality Date   AUGMENTATION MAMMAPLASTY  1975   AUGMENTATION MAMMAPLASTY  2017   2nd augmentation and lift   BIOPSY  07/11/2022   Procedure: BIOPSY;  Surgeon: Nicole Minium, MD;  Location: [facility] ENDOSCOPY;  Service: Endoscopy;;   BREAST SURGERY     impants replaced with lift   CARPAL TUNNEL RELEASE Right 08/26/2014   Procedure: CARPAL TUNNEL RELEASE;  Surgeon: Nicole Saint, MD;  Location: [facility] ORS;  Service: Orthopedics;  Laterality: Right;   COLONOSCOPY  2005   Dr Nicole Young   COLONOSCOPY N/A 06/24/2014   Procedure: COLONOSCOPY;  Surgeon: Nicole Mayotte, MD;  Location: [facility] ENDOSCOPY;  Service: Endoscopy;  Laterality: N/A;   ESOPHAGOGASTRODUODENOSCOPY (EGD) WITH PROPOFOL N/A 07/11/2022   Procedure: ESOPHAGOGASTRODUODENOSCOPY (EGD) WITH PROPOFOL;  Surgeon: Nicole Minium, MD;  Location: [facility] ENDOSCOPY;  Service: Endoscopy;  Laterality: N/A;   PARATHYROIDECTOMY  1992   PLACEMENT OF BREAST IMPLANTS  1975   TONSILECTOMY, ADENOIDECTOMY, BILATERAL MYRINGOTOMY AND TUBES      Family History  Problem Relation Age of Onset   Cancer Father        prostate   Prostate cancer Father    Hypertension Mother    Heart attack Mother    Heart attack Maternal Grandfather    Diabetes Paternal Uncle    Congestive Heart Failure Maternal Grandmother    CVA Paternal Grandmother    Prostate cancer Paternal Grandfather    Breast cancer Maternal Aunt     Social History   Socioeconomic History   Marital status: Widowed    Spouse name: Not on file   Number of children: 3   Years of education: Not on file   Highest education level: Bachelor's degree (e.g., BA, AB, BS)  Occupational History   Occupation: retired  Tobacco Use   Smoking status: Never   Smokeless tobacco: Never  Vaping Use   Vaping status: Never Used  Substance and Sexual Activity   Alcohol use: Yes     Alcohol/week: 1.0 standard drink of alcohol    Types: 1 Glasses of wine per week   Drug use: No   Sexual activity: Not on file  Other Topics Concern   Not on file  Social History Narrative   Not on file   Social Drivers of Health   Financial Resource Strain: Low Risk  (07/19/2022)   Received from [facility]   Overall Financial Resource Strain (CARDIA)    Difficulty of Paying Living Expenses: Not hard at all  Food Insecurity: No Food Insecurity (07/19/2022)   Received from [facility]   Hunger Vital Sign    Worried About Running Out of Food in the Last Year: Never true    Ran Out of Food in the Last Year: Never true  Transportation Needs: No Transportation Needs (07/19/2022)   Received from [facility] - Transportation    In the past 12 months, has lack of transportation kept you from medical appointments or from getting medications?: No    Lack of Transportation (Non-Medical): No  Physical Activity: Insufficiently Active (07/19/2022)   Received from [facility]   Exercise Vital Sign    Days of Exercise per Week: 3 days    Minutes of Exercise per Session: 30 min  Stress: No Stress Concern Present (02/22/2021)   [employer] of Occupational Health - Occupational Stress Questionnaire    Feeling of Stress : Not at all  Social Connections: Socially Isolated (02/22/2021)   Social Connection and Isolation Panel [NHANES]    Frequency of Communication with Friends and Family: More than three times a week    Frequency of Social Gatherings with Friends and Family: More than three times a week    Attends Religious Services: Never    Database administrator or Organizations: No    Attends Banker Meetings: Never    Marital Status: Widowed  Intimate Partner Violence: Not At Risk (02/22/2021)   Humiliation, Afraid, Rape, and Kick questionnaire    Fear of Current or Ex-Partner: No     Emotionally Abused: No    Physically Abused: No    Sexually Abused: No    Review of Systems  Constitutional:  Positive for weight loss. Negative for chills, fever and malaise/fatigue.  HENT:  Positive for hearing loss. Negative for congestion, sore throat and tinnitus.   Eyes: Negative.   Respiratory: Negative.  Negative for cough and shortness of breath.   Cardiovascular: Negative.  Negative for chest pain, palpitations and leg swelling.  Gastrointestinal:  Positive for abdominal pain and heartburn. Negative for constipation, diarrhea, nausea and vomiting.  Genitourinary: Negative.  Negative for dysuria and flank pain.  Musculoskeletal: Negative.  Negative for joint pain and myalgias.  Skin: Negative.   Neurological: Negative.  Negative for dizziness and headaches.  Endo/Heme/Allergies: Negative.   Psychiatric/Behavioral: Negative.  Negative for depression and suicidal ideas. The patient is not nervous/anxious.         Objective   BP 120/64   Pulse (!) 102   Ht 5' (1.524 m)   Wt 143 lb (64.9 kg)   SpO2 95%   BMI 27.93 kg/m   Physical Exam Vitals and nursing note reviewed.  Constitutional:      Appearance: Normal appearance.  HENT:     Head: Normocephalic and atraumatic.     Nose: Nose normal.     Mouth/Throat:     Mouth: Mucous membranes are moist.     Pharynx: Oropharynx is clear.  Eyes:     Conjunctiva/sclera: Conjunctivae normal.     Pupils: Pupils are equal, round, and reactive to light.  Cardiovascular:     Rate and Rhythm: Normal rate and regular rhythm.     Pulses: Normal pulses.     Heart sounds: Normal heart sounds. No murmur heard. Pulmonary:     Effort: Pulmonary effort is normal.     Breath sounds: Normal breath sounds. No wheezing.  Abdominal:     General: Bowel sounds are normal.     Palpations: Abdomen is soft. There is no mass.     Tenderness: There is abdominal tenderness. There is no right CVA tenderness, left CVA tenderness or rebound.      Hernia: No hernia is present.  Musculoskeletal:  General: Normal range of motion.     Cervical back: Normal range of motion.     Right lower leg: No edema.     Left lower leg: No edema.  Skin:    General: Skin is warm and dry.  Neurological:     General: No focal deficit present.     Mental Status: She is alert and oriented to person, place, and time.  Psychiatric:        Mood and Affect: Mood normal.        Behavior: Behavior normal.        Assessment & Plan:  Patient advised to continue her current medications.  She will maintain adequate hydration and simple meal. Problem List Items Addressed This Visit     Arthralgia of multiple joints   Hypertension associated with diabetes (HCC) - Primary   Adult hypothyroidism   Relevant Orders   TSH   Restless leg   Type 2 diabetes mellitus (HCC)   Relevant Orders   POCT CBG (Fasting - Glucose) (Completed)   CMP14+EGFR   Gastroesophageal reflux disease   Relevant Orders   CBC with Diff   OSA on CPAP   Combined hyperlipidemia associated with type 2 diabetes mellitus (HCC)   Other Visit Diagnoses       Pain of upper abdomen       Relevant Orders   Lipase   CT ABDOMEN PELVIS W CONTRAST     Colon cancer screening       Relevant Orders   Cologuard       Return in about 10 days (around 02/22/2023).   Total time spent: 30 minutes  Margaretann Loveless, MD  02/12/2023   This document may have been prepared by [facility] Voice Recognition software and as such may include unintentional dictation errors.

## 2023-02-13 LAB — CMP14+EGFR
ALT: 17 [IU]/L (ref 0–32)
AST: 19 [IU]/L (ref 0–40)
Albumin: 4.5 g/dL (ref 3.7–4.7)
Alkaline Phosphatase: 42 [IU]/L — ABNORMAL LOW (ref 44–121)
BUN/Creatinine Ratio: 29 — ABNORMAL HIGH (ref 12–28)
BUN: 20 mg/dL (ref 8–27)
Bilirubin Total: 0.4 mg/dL (ref 0.0–1.2)
CO2: 22 mmol/L (ref 20–29)
Calcium: 10 mg/dL (ref 8.7–10.3)
Chloride: 98 mmol/L (ref 96–106)
Creatinine, Ser: 0.69 mg/dL (ref 0.57–1.00)
Globulin, Total: 2 g/dL (ref 1.5–4.5)
Glucose: 118 mg/dL — ABNORMAL HIGH (ref 70–99)
Potassium: 4 mmol/L (ref 3.5–5.2)
Sodium: 136 mmol/L (ref 134–144)
Total Protein: 6.5 g/dL (ref 6.0–8.5)
eGFR: 86 mL/min/{1.73_m2} (ref >59)

## 2023-02-13 LAB — CBC WITH DIFFERENTIAL/PLATELET
Basophils Absolute: 0.1 10*3/uL (ref 0.0–0.2)
Basos: 1 %
EOS (ABSOLUTE): 0.1 10*3/uL (ref 0.0–0.4)
Eos: 1 %
Hematocrit: 38.6 % (ref 34.0–46.6)
Hemoglobin: 12.9 g/dL (ref 11.1–15.9)
Immature Grans (Abs): 0.1 10*3/uL (ref 0.0–0.1)
Immature Granulocytes: 1 %
Lymphocytes Absolute: 2.6 10*3/uL (ref 0.7–3.1)
Lymphs: 29 %
MCH: 30.1 pg (ref 26.6–33.0)
MCHC: 33.4 g/dL (ref 31.5–35.7)
MCV: 90 fL (ref 79–97)
Monocytes Absolute: 0.9 10*3/uL (ref 0.1–0.9)
Monocytes: 10 %
Neutrophils Absolute: 5.4 10*3/uL (ref 1.4–7.0)
Neutrophils: 58 %
Platelets: 255 10*3/uL (ref 150–450)
RBC: 4.29 x10E6/uL (ref 3.77–5.28)
RDW: 13.1 % (ref 11.7–15.4)
WBC: 9.1 10*3/uL (ref 3.4–10.8)

## 2023-02-13 LAB — LIPASE: Lipase: 71 U/L (ref 14–85)

## 2023-02-13 LAB — TSH: TSH: 1.78 u[IU]/mL (ref 0.450–4.500)

## 2023-02-20 ENCOUNTER — Ambulatory Visit (INDEPENDENT_AMBULATORY_CARE_PROVIDER_SITE_OTHER): Payer: Medicare Other | Admitting: Gastroenterology

## 2023-02-20 VITALS — BP 124/72 | HR 86 | Temp 97.8°F | Ht 61.0 in | Wt 144.4 lb

## 2023-02-20 DIAGNOSIS — Z8719 Personal history of other diseases of the digestive system: Secondary | ICD-10-CM | POA: Diagnosis not present

## 2023-02-20 DIAGNOSIS — Z09 Encounter for follow-up examination after completed treatment for conditions other than malignant neoplasm: Secondary | ICD-10-CM | POA: Diagnosis not present

## 2023-02-20 DIAGNOSIS — Z87898 Personal history of other specified conditions: Secondary | ICD-10-CM

## 2023-02-20 NOTE — Progress Notes (Signed)
Nicole Mood MD, MRCP(U.K) [address]  [address]  [place], [place] [address]  Main: [phone]  Fax: [phone]   Primary Care Physician: Nicole Loveless, MD  Primary Gastroenterologist:  Dr. Wyline Young   Chief Complaint  Patient presents with   Abdominal Pain   Vomiting    HPI: Nicole Young is a 83 y.o. female initially referred and seen in May 2024 with Celso Amy for reflux.  History of acid reflux over 20 years seen ENT specialist in the past has a history of globus sensation.  Occasional issues with dysphagia when she eats dry chicken.  Had been on 40 mg of Protonix in the morning and 20 mg of famotidine at night.  She subsequently underwent a upper endoscopy in July 24 with Dr. Servando Snare that showed a small hiatal hernia erythema the gastric body antrum duodenum appeared normal.  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 pylori was noted was treated with quad triple tetracycline and bismuth based therapy.  09/27/2022 H. pylori breath test was negative.  01/16/2023 iron studies normal, HbA1c 7.2 hemoglobin 12.9 creatinine 0.69.  TSH normal She has had a CT scan of the abdomen ordered for abdominal pain.   Here to see me for the same.  She says that she has had over the past few months 3 episodes of epigastric pain usually occurring right after she eats lasting about 3 to 4 hours and on 2 occasions the pain stopped suddenly.  It was usually after a fatty meal no gallbladder problems in the past.  Since then she has cut off fat in her diet as well as fiber.  She does suffer from constipation in the past.  The pain did not change in relation to bowel habits.  Denies any NSAID use.  She has stopped using PPI with no reflux symptoms takes famotidine as needed.  Denies any excessive gas or bloating at this point of time.  Presently has no abdominal pain. Current Outpatient Medications  Medication Sig Dispense Refill   Accu-Chek FastClix Lancets MISC CHECK SUGAR  ONCE DAILY. DX: E11.9 102 each 12   ACCU-CHEK SMARTVIEW test strip USE TO CHECK BLOOD SUGAR ONCE A DAY 100 strip 2   Blood Glucose Monitoring Suppl (ACCU-CHEK GUIDE ME) w/Device KIT      cholecalciferol (VITAMIN D3) 25 MCG (1000 UT) tablet Take 600 Units by mouth daily.     Cysteamine Bitartrate (PROCYSBI) 300 MG PACK      diazepam (VALIUM) 5 MG tablet Take by mouth.     estradiol (ESTRACE) 0.1 MG/GM vaginal cream 1 gram intravaginally at bedtime daily for 2-3 weeks, then decrease to 3 times per week 42.5 g 12   famotidine (PEPCID) 20 MG tablet Take 20 mg by mouth daily.     fluorouracil (EFUDEX) 5 % cream Apply to affected wart at bedtime 40 g 0   hydroquinone 4 % cream Apply topically 2 (two) times daily. 28.35 g 2   levothyroxine (SYNTHROID) 50 MCG tablet Take 1 tablet (50 mcg total) by mouth daily before breakfast. 90 tablet 0   losartan (COZAAR) 25 MG tablet Take 1 tablet by mouth daily.     Magnesium 250 MG TABS Take 1 tablet by mouth daily.     metFORMIN (GLUCOPHAGE-XR) 500 MG 24 hr tablet TAKE 1 TABLET TWICE A DAY (Patient taking differently: 500 mg 2 (two) times daily before a meal.) 180 tablet 3   methocarbamol (ROBAXIN) 500 MG tablet as  needed.     mometasone (ELOCON) 0.1 % cream Apply 1 application topically See admin instructions. Use 1 - 2 times daily until clear, then use as a spot treatment when needed 45 g 1   Multiple Vitamin (MULTIVITAMIN) capsule Take 1 capsule by mouth daily.     Multiple Vitamins-Minerals (PRESERVISION AREDS PO) Take by mouth 2 (two) times daily.     pantoprazole (PROTONIX) 20 MG tablet Take by mouth.     rosuvastatin (CRESTOR) 20 MG tablet TAKE 1 TABLET DAILY 90 tablet 3   sertraline (ZOLOFT) 100 MG tablet TAKE 1 TABLET DAILY (Patient taking differently: 50 mg. TAKE 1 TABLET DAILY) 90 tablet 3   No current facility-administered medications for this visit.    Allergies as of 02/20/2023 - Review Complete 02/20/2023  Allergen Reaction Noted    Neomycin-polymyxin-gramicidin Other (See Comments) 11/17/2014   Clarithromycin Other (See Comments) 07/27/2022   Neosporin [neomycin-bacitracin zn-polymyx] Swelling 08/22/2013   Sulfa antibiotics Other (See Comments) 12/11/2018   Thimerosal (thiomersal) Swelling 04/16/2014   Zostavax  [zoster vaccine live] Rash 11/12/2014    ROS:  General: Negative for anorexia, weight loss, fever, chills, fatigue, weakness. ENT: Negative for hoarseness, difficulty swallowing , nasal congestion. CV: Negative for chest pain, angina, palpitations, dyspnea on exertion, peripheral edema.  Respiratory: Negative for dyspnea at rest, dyspnea on exertion, cough, sputum, wheezing.  GI: See history of present illness. GU:  Negative for dysuria, hematuria, urinary incontinence, urinary frequency, nocturnal urination.  Endo: Negative for unusual weight change.    Physical Examination:   BP 124/72   Pulse 86   Temp 97.8 F (36.6 C) (Oral)   Ht 5\' 1"  (1.549 m)   Wt 144 lb 6.4 oz (65.5 kg)   BMI 27.28 kg/m   General: Well-nourished, well-developed in no acute distress.  Eyes: No icterus. Conjunctivae pink. Abdomen: Bowel sounds are normal, nontender, nondistended, no hepatosplenomegaly or masses, no abdominal bruits or hernia , no rebound or guarding.   Extremities: No lower extremity edema. No clubbing or deformities. Neuro: Alert and oriented x 3.  Grossly intact. Skin: Warm and dry, no jaundice.   Psych: Alert and cooperative, normal Young and affect.   Imaging Studies: MM 3D SCREEN BREAST W/IMPLANT BILATERAL Result Date: 01/26/2023 CLINICAL DATA:  Screening. EXAM: DIGITAL SCREENING BILATERAL MAMMOGRAM WITH IMPLANTS, CAD AND TOMOSYNTHESIS TECHNIQUE: Bilateral screening digital craniocaudal and mediolateral oblique mammograms were obtained. Bilateral screening digital breast tomosynthesis was performed. The images were evaluated with computer-aided detection. Standard and/or implant displaced views were  performed. COMPARISON:  Previous exam(s). ACR Breast Density Category b: There are scattered areas of fibroglandular density. FINDINGS: The patient has retropectoral implants. There are no findings suspicious for malignancy. IMPRESSION: No mammographic evidence of malignancy. A result letter of this screening mammogram will be mailed directly to the patient. RECOMMENDATION: Screening mammogram in one year. (Code:SM-B-01Y) BI-RADS CATEGORY  1: Negative. Electronically Signed   By: Ted Mcalpine M.D.   On: 01/26/2023 16:27    Assessment and Plan:   ELISHEVA FALLAS is a 83 y.o. y/o female here to see me for epigastric pain occurring on 3 occasions after a fatty meal.  The pain stopped all of a sudden.  This history and nature of the pain is concerning for biliary colic.  Does not fit a pattern of a peptic ulcer or gastroparesis.  Will obtain a HIDA scan right upper quadrant ultrasound and recheck for H. pylori persistence to ensure it was not a false negative test previously.  She had a CT scan of the abdomen scheduled will reevaluate her symptoms after we have all these results back.    Dr Nicole Mood  MD,MRCP [facility]) Follow up in 6 to 10 weeks

## 2023-02-20 NOTE — Patient Instructions (Addendum)
Please do not eat or drink after midnight the night before at [facility] on 02/27/2022 at 8:30 AM. If you need too reschedule your images, please call [phone].

## 2023-02-22 ENCOUNTER — Encounter: Payer: Self-pay | Admitting: Internal Medicine

## 2023-02-22 ENCOUNTER — Ambulatory Visit: Payer: Medicare Other | Admitting: Internal Medicine

## 2023-02-22 VITALS — BP 126/60 | HR 78 | Ht 60.0 in | Wt 143.2 lb

## 2023-02-22 DIAGNOSIS — E1159 Type 2 diabetes mellitus with other circulatory complications: Secondary | ICD-10-CM | POA: Diagnosis not present

## 2023-02-22 DIAGNOSIS — E782 Mixed hyperlipidemia: Secondary | ICD-10-CM

## 2023-02-22 DIAGNOSIS — E119 Type 2 diabetes mellitus without complications: Secondary | ICD-10-CM

## 2023-02-22 DIAGNOSIS — K219 Gastro-esophageal reflux disease without esophagitis: Secondary | ICD-10-CM

## 2023-02-22 DIAGNOSIS — E1169 Type 2 diabetes mellitus with other specified complication: Secondary | ICD-10-CM

## 2023-02-22 DIAGNOSIS — E039 Hypothyroidism, unspecified: Secondary | ICD-10-CM | POA: Diagnosis not present

## 2023-02-22 DIAGNOSIS — G4733 Obstructive sleep apnea (adult) (pediatric): Secondary | ICD-10-CM

## 2023-02-22 DIAGNOSIS — I152 Hypertension secondary to endocrine disorders: Secondary | ICD-10-CM

## 2023-02-22 LAB — H. PYLORI BREATH TEST: H pylori Breath Test: NEGATIVE

## 2023-02-22 LAB — POCT CBG (FASTING - GLUCOSE)-MANUAL ENTRY: Glucose Fasting, POC: 125 mg/dL — AB (ref 70–99)

## 2023-02-22 NOTE — Progress Notes (Signed)
Established Patient Office Visit  Subjective:  Patient ID: Nicole Young, female    DOB: 29-Jan-1940  Age: 83 y.o. MRN: [id number]  Chief Complaint  Patient presents with   Follow-up    10 day follow up    Patient comes in for her follow-up today.  She reports that her abdominal pain has actually gotten better, but she was seen by her gastroenterologist and an abdominal ultrasound with a HIDA scan has been scheduled.  She denies any nausea or vomiting, no constipation and no diarrhea. She will continue all her medications as prescribed. Will get her annual wellness visit along with Pap at next follow-up.    No other concerns at this time.   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[facility])     Past Surgical History:  Procedure Laterality Date   AUGMENTATION MAMMAPLASTY  1975   AUGMENTATION MAMMAPLASTY  2017   2nd augmentation and lift   BIOPSY  07/11/2022   Procedure: BIOPSY;  Surgeon: Midge Minium, MD;  Location: [facility] ENDOSCOPY;  Service: Endoscopy;;   BREAST SURGERY     impants replaced with lift   CARPAL TUNNEL RELEASE Right 08/26/2014   Procedure: CARPAL TUNNEL RELEASE;  Surgeon: Deeann Saint, MD;  Location: [facility] ORS;  Service: Orthopedics;  Laterality: Right;   COLONOSCOPY  2005   Dr Lemar Livings   COLONOSCOPY N/A 06/24/2014   Procedure: COLONOSCOPY;  Surgeon: Earline Mayotte, MD;  Location: [facility] ENDOSCOPY;  Service: Endoscopy;  Laterality: N/A;   ESOPHAGOGASTRODUODENOSCOPY (EGD) WITH PROPOFOL N/A 07/11/2022   Procedure: ESOPHAGOGASTRODUODENOSCOPY (EGD) WITH PROPOFOL;  Surgeon:  Midge Minium, MD;  Location: [facility] ENDOSCOPY;  Service: Endoscopy;  Laterality: N/A;   PARATHYROIDECTOMY  1992   PLACEMENT OF BREAST IMPLANTS  1975   TONSILECTOMY, ADENOIDECTOMY, BILATERAL MYRINGOTOMY AND TUBES      Social History   Socioeconomic History   Marital status: Widowed    Spouse name: Not on file   Number of children: 3   Years of education: Not on file   Highest education level: Bachelor's degree (e.g., BA, AB, BS)  Occupational History   Occupation: retired  Tobacco Use   Smoking status: Never   Smokeless tobacco: Never  Vaping Use   Vaping status: Never Used  Substance and Sexual Activity   Alcohol use: Yes    Alcohol/week: 1.0 standard drink of alcohol    Types: 1 Glasses of wine per week   Drug use: No   Sexual activity: Not on file  Other Topics Concern   Not on file  Social History Narrative   Not on file   Social Drivers of Health   Financial Resource Strain: Low Risk  (07/19/2022)   Received from [facility]   Overall Financial Resource Strain (CARDIA)    Difficulty of Paying Living Expenses: Not hard at all  Food Insecurity: No Food Insecurity (07/19/2022)   Received from [facility]   Hunger Vital Sign    Worried About Running Out of Food  in the Last Year: Never true    Ran Out of Food in the Last Year: Never true  Transportation Needs: No Transportation Needs (07/19/2022)   Received from [facility] - Transportation    In the past 12 months, has lack of transportation kept you from medical appointments or from getting medications?: No    Lack of Transportation (Non-Medical): No  Physical Activity: Insufficiently Active (07/19/2022)   Received from [facility]   Exercise Vital Sign    Days of Exercise per Week: 3 days    Minutes of Exercise per Session: 30 min  Stress: No Stress Concern Present (02/22/2021)   [employer] of Occupational Health - Occupational  Stress Questionnaire    Feeling of Stress : Not at all  Social Connections: Socially Isolated (02/22/2021)   Social Connection and Isolation Panel [NHANES]    Frequency of Communication with Friends and Family: More than three times a week    Frequency of Social Gatherings with Friends and Family: More than three times a week    Attends Religious Services: Never    Database administrator or Organizations: No    Attends Banker Meetings: Never    Marital Status: Widowed  Intimate Partner Violence: Not At Risk (02/22/2021)   Humiliation, Afraid, Rape, and Kick questionnaire    Fear of Current or Ex-Partner: No    Emotionally Abused: No    Physically Abused: No    Sexually Abused: No    Family History  Problem Relation Age of Onset   Cancer Father        prostate   Prostate cancer Father    Hypertension Mother    Heart attack Mother    Heart attack Maternal Grandfather    Diabetes Paternal Uncle    Congestive Heart Failure Maternal Grandmother    CVA Paternal Grandmother    Prostate cancer Paternal Grandfather    Breast cancer Maternal Aunt     Allergies  Allergen Reactions   Neomycin-Polymyxin-Gramicidin Other (See Comments)    Other Reaction: Other reaction   Clarithromycin Other (See Comments)    Dizziness   Neosporin [Neomycin-Bacitracin Zn-Polymyx] Swelling   Sulfa Antibiotics Other (See Comments)   Thimerosal (Thiomersal) Swelling   Zostavax  [Zoster Vaccine Live] Rash    Do Not Administer Zostavax to Pt!    Outpatient Medications Prior to Visit  Medication Sig   Accu-Chek FastClix Lancets MISC CHECK SUGAR ONCE DAILY. DX: E11.9   ACCU-CHEK SMARTVIEW test strip USE TO CHECK BLOOD SUGAR ONCE A DAY   Blood Glucose Monitoring Suppl (ACCU-CHEK GUIDE ME) w/Device KIT    cholecalciferol (VITAMIN D3) 25 MCG (1000 UT) tablet Take 600 Units by mouth daily.   Cysteamine Bitartrate (PROCYSBI) 300 MG PACK    diazepam (VALIUM) 5 MG tablet Take by mouth.    estradiol (ESTRACE) 0.1 MG/GM vaginal cream 1 gram intravaginally at bedtime daily for 2-3 weeks, then decrease to 3 times per week   famotidine (PEPCID) 20 MG tablet Take 20 mg by mouth daily.   fluorouracil (EFUDEX) 5 % cream Apply to affected wart at bedtime   hydroquinone 4 % cream Apply topically 2 (two) times daily.   levothyroxine (SYNTHROID) 50 MCG tablet Take 1 tablet (50 mcg total) by mouth daily before breakfast.   losartan (COZAAR) 25 MG tablet Take 1 tablet by mouth daily.   Magnesium 250 MG TABS Take 1 tablet by mouth daily.   metFORMIN (GLUCOPHAGE-XR) 500  MG 24 hr tablet TAKE 1 TABLET TWICE A DAY (Patient taking differently: 500 mg 2 (two) times daily before a meal.)   methocarbamol (ROBAXIN) 500 MG tablet as needed.   mometasone (ELOCON) 0.1 % cream Apply 1 application topically See admin instructions. Use 1 - 2 times daily until clear, then use as a spot treatment when needed   Multiple Vitamin (MULTIVITAMIN) capsule Take 1 capsule by mouth daily.   Multiple Vitamins-Minerals (PRESERVISION AREDS PO) Take by mouth 2 (two) times daily.   pantoprazole (PROTONIX) 20 MG tablet Take by mouth.   rosuvastatin (CRESTOR) 20 MG tablet TAKE 1 TABLET DAILY   sertraline (ZOLOFT) 100 MG tablet TAKE 1 TABLET DAILY (Patient taking differently: 50 mg. TAKE 1 TABLET DAILY)   No facility-administered medications prior to visit.    Review of Systems  Constitutional: Negative.  Negative for chills, diaphoresis, fever, malaise/fatigue and weight loss.  HENT: Negative.  Negative for congestion, sinus pain and sore throat.   Eyes: Negative.   Respiratory: Negative.  Negative for cough, shortness of breath and stridor.   Cardiovascular: Negative.  Negative for chest pain, palpitations and leg swelling.  Gastrointestinal: Negative.  Negative for abdominal pain, constipation, diarrhea, heartburn, nausea and vomiting.  Genitourinary: Negative.  Negative for dysuria and flank pain.  Musculoskeletal:  Negative.  Negative for joint pain and myalgias.  Skin: Negative.   Neurological: Negative.  Negative for dizziness and headaches.  Endo/Heme/Allergies: Negative.   Psychiatric/Behavioral: Negative.  Negative for depression and suicidal ideas. The patient is not nervous/anxious.        Objective:   BP 126/60   Pulse 78   Ht 5' (1.524 m)   Wt 143 lb 3.2 oz (65 kg)   SpO2 96%   BMI 27.97 kg/m   Vitals:   02/22/23 1157  BP: 126/60  Pulse: 78  Height: 5' (1.524 m)  Weight: 143 lb 3.2 oz (65 kg)  SpO2: 96%  BMI (Calculated): 27.97    Physical Exam Vitals and nursing note reviewed.  Constitutional:      Appearance: Normal appearance.  HENT:     Head: Normocephalic and atraumatic.     Nose: Nose normal.     Mouth/Throat:     Mouth: Mucous membranes are moist.     Pharynx: Oropharynx is clear.  Eyes:     Conjunctiva/sclera: Conjunctivae normal.     Pupils: Pupils are equal, round, and reactive to light.  Cardiovascular:     Rate and Rhythm: Normal rate and regular rhythm.     Pulses: Normal pulses.     Heart sounds: Normal heart sounds. No murmur heard. Pulmonary:     Effort: Pulmonary effort is normal.     Breath sounds: Normal breath sounds. No wheezing.  Abdominal:     General: Bowel sounds are normal.     Palpations: Abdomen is soft.     Tenderness: There is no abdominal tenderness. There is no right CVA tenderness or left CVA tenderness.  Musculoskeletal:        General: Normal range of motion.     Cervical back: Normal range of motion.     Right lower leg: No edema.     Left lower leg: No edema.  Skin:    General: Skin is warm and dry.  Neurological:     General: No focal deficit present.     Mental Status: She is alert and oriented to person, place, and time.  Psychiatric:  Mood and Affect: Mood normal.        Behavior: Behavior normal.      Results for orders placed or performed in visit on 02/22/23  POCT CBG (Fasting - Glucose)  Result  Value Ref Range   Glucose Fasting, POC 125 (A) 70 - 99 mg/dL    Recent Results (from the past 2160 hours)  Fe+TIBC+Fer     Status: None   Collection Time: 01/16/23  1:26 PM  Result Value Ref Range   Total Iron Binding Capacity 339 250 - 450 ug/dL   UIBC 742 595 - 638 ug/dL   Iron 96 27 - 756 ug/dL   Iron Saturation 28 15 - 55 %   Ferritin 78 15 - 150 ng/mL  POCT CBG (Fasting - Glucose)     Status: Abnormal   Collection Time: 02/12/23  1:48 PM  Result Value Ref Range   Glucose Fasting, POC 159 (A) 70 - 99 mg/dL  CBC with Diff     Status: None   Collection Time: 02/12/23  2:32 PM  Result Value Ref Range   WBC 9.1 3.4 - 10.8 x10E3/uL   RBC 4.29 3.77 - 5.28 x10E6/uL   Hemoglobin 12.9 11.1 - 15.9 g/dL   Hematocrit 43.3 29.5 - 46.6 %   MCV 90 79 - 97 fL   MCH 30.1 26.6 - 33.0 pg   MCHC 33.4 31.5 - 35.7 g/dL   RDW 18.8 41.6 - 60.6 %   Platelets 255 150 - 450 x10E3/uL   Neutrophils 58 Not Estab. %   Lymphs 29 Not Estab. %   Monocytes 10 Not Estab. %   Eos 1 Not Estab. %   Basos 1 Not Estab. %   Neutrophils Absolute 5.4 1.4 - 7.0 x10E3/uL   Lymphocytes Absolute 2.6 0.7 - 3.1 x10E3/uL   Monocytes Absolute 0.9 0.1 - 0.9 x10E3/uL   EOS (ABSOLUTE) 0.1 0.0 - 0.4 x10E3/uL   Basophils Absolute 0.1 0.0 - 0.2 x10E3/uL   Immature Granulocytes 1 Not Estab. %   Immature Grans (Abs) 0.1 0.0 - 0.1 x10E3/uL  CMP14+EGFR     Status: Abnormal   Collection Time: 02/12/23  2:32 PM  Result Value Ref Range   Glucose 118 (H) 70 - 99 mg/dL   BUN 20 8 - 27 mg/dL   Creatinine, Ser 3.01 0.57 - 1.00 mg/dL   eGFR 86 >60 FU/XNA/3.55   BUN/Creatinine Ratio 29 (H) 12 - 28   Sodium 136 134 - 144 mmol/L   Potassium 4.0 3.5 - 5.2 mmol/L   Chloride 98 96 - 106 mmol/L   CO2 22 20 - 29 mmol/L   Calcium 10.0 8.7 - 10.3 mg/dL   Total Protein 6.5 6.0 - 8.5 g/dL   Albumin 4.5 3.7 - 4.7 g/dL   Globulin, Total 2.0 1.5 - 4.5 g/dL   Bilirubin Total 0.4 0.0 - 1.2 mg/dL   Alkaline Phosphatase 42 (L) 44 - 121 IU/L    AST 19 0 - 40 IU/L   ALT 17 0 - 32 IU/L  Lipase     Status: None   Collection Time: 02/12/23  2:32 PM  Result Value Ref Range   Lipase 71 14 - 85 U/L  TSH     Status: None   Collection Time: 02/12/23  2:32 PM  Result Value Ref Range   TSH 1.780 0.450 - 4.500 uIU/mL  H. pylori breath test     Status: None   Collection Time: 02/20/23  3:44 PM  Result  Value Ref Range   H pylori Breath Test Negative Negative  POCT CBG (Fasting - Glucose)     Status: Abnormal   Collection Time: 02/22/23 12:01 PM  Result Value Ref Range   Glucose Fasting, POC 125 (A) 70 - 99 mg/dL      Assessment & Plan:  Patient will continue all her medications.  She will continue to get her abdominal ultrasound scheduled. Problem List Items Addressed This Visit     Diabetes mellitus (HCC)   Relevant Orders   POCT CBG (Fasting - Glucose) (Completed)   Hypertension associated with diabetes (HCC) - Primary   Adult hypothyroidism   Gastroesophageal reflux disease   OSA on CPAP   Combined hyperlipidemia associated with type 2 diabetes mellitus (HCC)    Follow up as scheduled.  Total time spent: 25 minutes  Margaretann Loveless, MD  02/22/2023   This document may have been prepared by [facility] Voice Recognition software and as such may include unintentional dictation errors.

## 2023-02-24 LAB — COLOGUARD: COLOGUARD: POSITIVE — AB

## 2023-02-26 ENCOUNTER — Telehealth: Payer: Self-pay | Admitting: Internal Medicine

## 2023-02-26 ENCOUNTER — Telehealth: Payer: Self-pay | Admitting: *Deleted

## 2023-02-26 ENCOUNTER — Other Ambulatory Visit: Payer: Self-pay | Admitting: Internal Medicine

## 2023-02-26 DIAGNOSIS — R195 Other fecal abnormalities: Secondary | ICD-10-CM

## 2023-02-26 NOTE — Telephone Encounter (Signed)
This patient was last seen in the office on 02/20/2023 for History of abdominal pain, she will be getting some imaging. She is 83 years old.  However, she called the office today because she had a positive Cologuard test from PCP. Patient wanted to know it she can go ahead and schedule.

## 2023-02-26 NOTE — Telephone Encounter (Signed)
Spoken to patient and inform patient of the message as follows  Wyline Mood, MD   Inform that we do not do a colonoscopy usually over the age of 41 which is when we stop performing test for colon cancer screening such as Cologuard.  Any test that is done after that would require a office visit to discuss the pros and cons of getti ng a colonoscopy and cannot be scheduled without having this discussion due to the increased risk of complications associated with the procedure as 1 gets older

## 2023-02-26 NOTE — Telephone Encounter (Signed)
Patient left VM that her Cologuard came back positive and she would like to have a colonoscopy ASAP. Please place referral to GI for colonoscopy. I will call patient once referral is placed.

## 2023-02-26 NOTE — Telephone Encounter (Signed)
Patient insisted on scheduling an appointment. I am not sure where this patient can be schedule at.  PCP placed this as an urgent referral.

## 2023-02-27 ENCOUNTER — Telehealth: Payer: Self-pay | Admitting: Gastroenterology

## 2023-02-27 NOTE — Telephone Encounter (Signed)
The patient called in to schedule an office visit for her urgent colonoscopy referral. She is schedule with Dr. Tobi Bastos for 03/05/23 at 1:15PM.

## 2023-02-27 NOTE — Progress Notes (Signed)
 Patient notified

## 2023-02-28 ENCOUNTER — Ambulatory Visit
Admission: RE | Admit: 2023-02-28 | Discharge: 2023-02-28 | Disposition: A | Discharge status: Home / Self Care | Payer: Medicare Other | Source: Ambulatory Visit | Attending: Gastroenterology | Admitting: Gastroenterology

## 2023-02-28 ENCOUNTER — Ambulatory Visit
Admission: RE | Admit: 2023-02-28 | Discharge: 2023-02-28 | Discharge status: Home / Self Care | Payer: Medicare Other | Source: Ambulatory Visit | Attending: Gastroenterology

## 2023-02-28 DIAGNOSIS — Z87898 Personal history of other specified conditions: Secondary | ICD-10-CM | POA: Diagnosis present

## 2023-02-28 MED ORDER — TECHNETIUM TC 99M MEBROFENIN IV KIT
5.1600 | PACK | Freq: Once | INTRAVENOUS | Status: AC | PRN
  Administered 2023-02-28: 5.16 via INTRAVENOUS

## 2023-02-28 NOTE — Telephone Encounter (Signed)
Dr. Tobi Bastos, I was attached on to this message but not sure why she is getting an appointment again as you had recently seen her on 02/20/2023 and you had stated for her to have a HIDA Scan and an Ultrasound (done today 02/28/2023) and then follow with you in 6-8 weeks. Am I missing something? She was scheduled to see you again on 03/05/2023. Please advise.

## 2023-02-28 NOTE — Telephone Encounter (Signed)
 okay

## 2023-03-01 NOTE — Telephone Encounter (Signed)
Dr. Tobi Bastos, patient stated that Dr. Welton Flakes had ordered that CT Scan before seeing you. Therefore, she stated that he had cancelled it since you ad ordered a HIDA Scan and ultrasound. So, she stated that she will be here on Monday to discuss about her positive cologuard results and the images that you had ordered.

## 2023-03-02 ENCOUNTER — Telehealth: Payer: Self-pay

## 2023-03-02 DIAGNOSIS — Z87898 Personal history of other specified conditions: Secondary | ICD-10-CM

## 2023-03-02 DIAGNOSIS — K8020 Calculus of gallbladder without cholecystitis without obstruction: Secondary | ICD-10-CM

## 2023-03-02 NOTE — Telephone Encounter (Signed)
-----   Message from Wyline Mood sent at 03/01/2023 10:01 AM EST ----- Inform HIDA scan is abnormal - Gall bladder no squeezing adequately - please refer to Dr Everlene Farrier or Dr Aleen Campi to discuss cholecystectomy

## 2023-03-02 NOTE — Telephone Encounter (Signed)
Called patient to let her know that Dr. Tobi Bastos recommended her to be seen by a general surgeon since her HIDA Scan showed that her gallbladder was not squeezing adequately. Referral was rent.

## 2023-03-02 NOTE — Telephone Encounter (Signed)
Called patient to let her know that due to her HIDA Scan result stating that she had cholecystitis, that Dr. Tobi Bastos recommended to be seen by a general surgeon and to follow up with Dr. Tobi Bastos afterwards. However, patient stated that she would come to her appointment on Monday with Dr. Tobi Bastos since her cologuard was positive. I mentioned it to Dr. Tobi Bastos and her appointment still remained.

## 2023-03-05 ENCOUNTER — Ambulatory Visit (INDEPENDENT_AMBULATORY_CARE_PROVIDER_SITE_OTHER): Payer: Medicare Other | Admitting: Gastroenterology

## 2023-03-05 VITALS — BP 121/69 | HR 92 | Temp 98.1°F | Wt 145.8 lb

## 2023-03-05 DIAGNOSIS — Z83719 Family history of colon polyps, unspecified: Secondary | ICD-10-CM

## 2023-03-05 DIAGNOSIS — R195 Other fecal abnormalities: Secondary | ICD-10-CM | POA: Diagnosis not present

## 2023-03-05 NOTE — Progress Notes (Signed)
 Wyline Mood MD, MRCP(U.K) [address]  [address]  [place], [place] [address]  Main: [phone]  Fax: [phone]   Primary Care Physician: Margaretann Loveless, MD  Primary Gastroenterologist:  Dr. Wyline Mood   Chief Complaint  Patient presents with   follow up    Talk about her positive cologuard results    HPI: Nicole Young is a 83 y.o. femaleinitially referred and seen in May 2024 with Celso Amy for reflux.  History of acid reflux over 20 years seen ENT specialist in the past has a history of globus sensation.  Occasional issues with dysphagia when she eats dry chicken.  Had been on 40 mg of Protonix in the morning and 20 mg of famotidine at night.  She subsequently underwent a upper endoscopy in July 24 with Dr. Servando Snare that showed a small hiatal hernia erythema the gastric body antrum duodenum appeared normal.  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 pylori was noted was treated with quad triple tetracycline and bismuth based therapy.  09/27/2022 H. pylori breath test was negative.   01/16/2023 iron studies normal, HbA1c 7.2 hemoglobin 12.9 creatinine 0.69.  TSH normal She has had a CT scan of the abdomen ordered for abdominal pain.   At her last visit she says that she has had over the past few months 3 episodes of epigastric pain usually occurring right after she eats lasting about 3 to 4 hours and on 2 occasions the pain stopped suddenly.  It was usually after a fatty meal no gallbladder problems in the past.  Since then she has cut off fat in her diet as well as fiber.  She does suffer from constipation in the past.  The pain did not change in relation to bowel habits.  Denies any NSAID use.  She has stopped using PPI with no reflux symptoms takes famotidine as needed.  Denies any excessive gas or bloating at this point of time.  Presently has no abdominal pain.   Seen Dr Welton Flakes 02/22/2023: She says that she requested the test to be done since she has a  family history of colon polyps.  Denies any GI symptoms or self no blood in the stool the test was positive 2/19/202%: HIDA scan shows low EF 9 % USG Abdomen : Chololithiasis.  Last colonoscopy in June 2016 by Dr. Lemar Livings.  5 mm polyp in the rectum that was resected and hyperplastic.    Current Outpatient Medications  Medication Sig Dispense Refill   Accu-Chek FastClix Lancets MISC CHECK SUGAR ONCE DAILY. DX: E11.9 102 each 12   ACCU-CHEK SMARTVIEW test strip USE TO CHECK BLOOD SUGAR ONCE A DAY 100 strip 2   Blood Glucose Monitoring Suppl (ACCU-CHEK GUIDE ME) w/Device KIT      cholecalciferol (VITAMIN D3) 25 MCG (1000 UT) tablet Take 600 Units by mouth daily.     Cysteamine Bitartrate (PROCYSBI) 300 MG PACK      diazepam (VALIUM) 5 MG tablet Take by mouth.     estradiol (ESTRACE) 0.1 MG/GM vaginal cream 1 gram intravaginally at bedtime daily for 2-3 weeks, then decrease to 3 times per week 42.5 g 12   famotidine (PEPCID) 20 MG tablet Take 20 mg by mouth daily.     fluorouracil (EFUDEX) 5 % cream Apply to affected wart at bedtime 40 g 0   hydroquinone 4 % cream Apply topically 2 (two) times daily. 28.35 g 2   levothyroxine (SYNTHROID) 50 MCG tablet Take 1  tablet (50 mcg total) by mouth daily before breakfast. 90 tablet 0   losartan (COZAAR) 25 MG tablet Take 1 tablet by mouth daily.     Magnesium 250 MG TABS Take 1 tablet by mouth daily.     metFORMIN (GLUCOPHAGE-XR) 500 MG 24 hr tablet TAKE 1 TABLET TWICE A DAY (Patient taking differently: 500 mg 2 (two) times daily before a meal.) 180 tablet 3   methocarbamol (ROBAXIN) 500 MG tablet as needed.     mometasone (ELOCON) 0.1 % cream Apply 1 application topically See admin instructions. Use 1 - 2 times daily until clear, then use as a spot treatment when needed 45 g 1   Multiple Vitamin (MULTIVITAMIN) capsule Take 1 capsule by mouth daily.     Multiple Vitamins-Minerals (PRESERVISION AREDS PO) Take by mouth 2 (two) times daily.      pantoprazole (PROTONIX) 20 MG tablet Take by mouth.     rosuvastatin (CRESTOR) 20 MG tablet TAKE 1 TABLET DAILY 90 tablet 3   sertraline (ZOLOFT) 100 MG tablet TAKE 1 TABLET DAILY (Patient taking differently: 50 mg. TAKE 1 TABLET DAILY) 90 tablet 3   No current facility-administered medications for this visit.       ROS:  General: Negative for anorexia, weight loss, fever, chills, fatigue, weakness. ENT: Negative for hoarseness, difficulty swallowing , nasal congestion. CV: Negative for chest pain, angina, palpitations, dyspnea on exertion, peripheral edema.  Respiratory: Negative for dyspnea at rest, dyspnea on exertion, cough, sputum, wheezing.  GI: See history of present illness. GU:  Negative for dysuria, hematuria, urinary incontinence, urinary frequency, nocturnal urination.  Endo: Negative for unusual weight change.    Physical Examination:   BP 121/69   Pulse 92   Temp 98.1 F (36.7 C) (Oral)   Wt 145 lb 12.8 oz (66.1 kg)   BMI 28.47 kg/m   General: Well-nourished, well-developed in no acute distress.  Eyes: No icterus. Conjunctivae pink. Mouth: Oropharyngeal mucosa moist and pink , no lesions erythema or exudate. Skin: Warm and dry, no jaundice.   Psych: Alert and cooperative, normal mood and affect.   Imaging Studies: NM Hepato W/EjeCT Fract Result Date: 02/28/2023 CLINICAL DATA:  right upper quadrant abdominal pain. EXAM: NUCLEAR MEDICINE HEPATOBILIARY IMAGING WITH GALLBLADDER EF TECHNIQUE: Sequential images of the abdomen were obtained out to 60 minutes following intravenous administration of radiopharmaceutical. After oral ingestion of Ensure, gallbladder ejection fraction was determined. At 60 min, normal ejection fraction is greater than 33%. RADIOPHARMACEUTICALS:  5.2 mCi Tc-28m  Choletec IV COMPARISON:  Ultrasound February 28, 2023 FINDINGS: Prompt uptake and biliary excretion of activity by the liver is seen. Gallbladder activity is visualized, consistent with  patency of cystic duct. Biliary activity passes into small bowel, consistent with patent common bile duct. Calculated gallbladder ejection fraction is 9%. 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   On: 02/28/2023 15:33   US ABDOMEN LIMITED RUQ (LIVER/GB) Result Date: 02/28/2023 CLINICAL DATA:  Right upper quadrant pain EXAM: ULTRASOUND ABDOMEN LIMITED RIGHT UPPER QUADRANT COMPARISON:  CT abdomen pelvis 08/26/2018 FINDINGS: Gallbladder: Cholelithiasis. No gallbladder wall thickening or pericholecystic fluid. Negative sonographic Murphy's sign. Common bile duct: Diameter: 2.4 mm Liver: No focal lesion identified. Within normal limits in parenchymal echogenicity. Portal vein is patent on color Doppler imaging with normal direction of blood flow towards the liver. Other:  None. IMPRESSION: Cholelithiasis without secondary signs of acute cholecystitis. Electronically Signed   By: Annia Belt M.D.   On: 02/28/2023 11:35    Assessment and Plan:   Nicole Young is a 83 y.o. y/o female here to see me for epigastric pain occurring on 3 occasions after a fatty meal.  HIDA scan shows gallbladder ejection fraction of 8% and it fit the description of her pain being postprandial.  Again here to see me today for a positive Cologuard which was done a few weeks back which she says she requested herself due to family history of colon polyps. Last colonoscopy was in 2016 and she had no adenomatous polyps.  She would not have been due for any form of colon cancer screening till 2026. Explained we usually do not recommend any colon cancer screening above age of 61 , and are very highly selective only after a discussion on pros and cons after age of 21 due to the increased risk vs benefit ratio.  She has an appointment with  Dr. Aleen Campi for her gallbladder.  I advised her to think about her positive Cologuard test and the neck step would be a colonoscopy if she wishes to go through she can call my office when she makes up her mind to have it scheduled if she decides to go ahead    Dr Wyline Mood  MD,MRCP [facility]) Follow up in 2 months

## 2023-03-16 ENCOUNTER — Telehealth: Payer: Self-pay

## 2023-03-16 ENCOUNTER — Telehealth: Payer: Self-pay | Admitting: Gastroenterology

## 2023-03-16 ENCOUNTER — Encounter: Payer: Self-pay | Admitting: Surgery

## 2023-03-16 ENCOUNTER — Ambulatory Visit (INDEPENDENT_AMBULATORY_CARE_PROVIDER_SITE_OTHER): Payer: Medicare Other | Admitting: Surgery

## 2023-03-16 VITALS — BP 136/70 | HR 86 | Ht 61.0 in | Wt 143.2 lb

## 2023-03-16 DIAGNOSIS — K8020 Calculus of gallbladder without cholecystitis without obstruction: Secondary | ICD-10-CM

## 2023-03-16 DIAGNOSIS — K828 Other specified diseases of gallbladder: Secondary | ICD-10-CM | POA: Diagnosis not present

## 2023-03-16 NOTE — Telephone Encounter (Signed)
 Pt requesting call back has question

## 2023-03-16 NOTE — Telephone Encounter (Signed)
Faxed medical clearance to Dr. Lamonte Sakai at [phone].

## 2023-03-16 NOTE — Patient Instructions (Signed)
 Please contact our office when you have your colonoscopy.  You have requested to have your gallbladder removed. This will be done at [facility] with Dr. Aleen Campi.  You will most likely be out of work 1-2 weeks for this surgery.  If you have FMLA or disability paperwork that needs filled out you may drop this off at our office or this can be faxed to [phone].  You will return after your post-op appointment with a lifting restriction for approximately 4 more weeks.  You will be able to eat anything you would like to following surgery. But, start by eating a bland diet and advance this as tolerated. The Gallbladder diet is below, please go as closely by this diet as possible prior to surgery to avoid any further attacks.  Please see the (blue)pre-care form that you have been given today. Our surgery scheduler will call you to verify surgery date and to go over information.   If you have any questions, please call our office.  Laparoscopic Cholecystectomy Laparoscopic cholecystectomy is surgery to remove the gallbladder. The gallbladder is located in the upper right part of the abdomen, behind the liver. It is a storage sac for bile, which is produced in the liver. Bile aids in the digestion and absorption of fats. Cholecystectomy is often done for inflammation of the gallbladder (cholecystitis). This condition is usually caused by a buildup of gallstones (cholelithiasis) in the gallbladder. Gallstones can block the flow of bile, and that can result in inflammation and pain. In severe cases, emergency surgery may be required. If emergency surgery is not required, you will have time to prepare for the procedure. Laparoscopic surgery is an alternative to open surgery. Laparoscopic surgery has a shorter recovery time. Your common bile duct may also need to be examined during the procedure. If stones are found in the common bile duct, they may be removed. LET [facility] CARE PROVIDER KNOW  ABOUT: Any allergies you have. All medicines you are taking, including vitamins, herbs, eye drops, creams, and over-the-counter medicines. Previous problems you or members of your family have had with the use of anesthetics. Any blood disorders you have. Previous surgeries you have had.  Any medical conditions you have. RISKS AND COMPLICATIONS Generally, this is a safe procedure. However, problems may occur, including: Infection. Bleeding. Allergic reactions to medicines. Damage to other structures or organs. A stone remaining in the common bile duct. A bile leak from the cyst duct that is clipped when your gallbladder is removed. The need to convert to open surgery, which requires a larger incision in the abdomen. This may be necessary if your surgeon thinks that it is not safe to continue with a laparoscopic procedure. BEFORE THE PROCEDURE Ask your health care provider about: Changing or stopping your regular medicines. This is especially important if you are taking diabetes medicines or blood thinners. Taking medicines such as aspirin and ibuprofen. These medicines can thin your blood. Do not take these medicines before your procedure if your health care provider instructs you not to. Follow instructions from your health care provider about eating or drinking restrictions. Let your health care provider know if you develop a cold or an infection before surgery. Plan to have someone take you home after the procedure. Ask your health care provider how your surgical site will be marked or identified. You may be given antibiotic medicine to help prevent infection. PROCEDURE To reduce your risk of infection: Your health care team will wash or sanitize their  hands. Your skin will be washed with soap. An IV tube may be inserted into one of your veins. You will be given a medicine to make you fall asleep (general anesthetic). A breathing tube will be placed in your mouth. The surgeon will  make several small cuts (incisions) in your abdomen. A thin, lighted tube (laparoscope) that has a tiny camera on the end will be inserted through one of the small incisions. The camera on the laparoscope will send a picture to a TV screen (monitor) in the operating room. This will give the surgeon a good view inside your abdomen. A gas will be pumped into your abdomen. This will expand your abdomen to give the surgeon more room to perform the surgery. Other tools that are needed for the procedure will be inserted through the other incisions. The gallbladder will be removed through one of the incisions. After your gallbladder has been removed, the incisions will be closed with stitches (sutures), staples, or skin glue. Your incisions may be covered with a bandage (dressing). The procedure may vary among health care providers and hospitals. AFTER THE PROCEDURE Your blood pressure, heart rate, breathing rate, and blood oxygen level will be monitored often until the medicines you were given have worn off. You will be given medicines as needed to control your pain.   This information is not intended to replace advice given to you by your health care provider. Make sure you discuss any questions you have with your health care provider.   Document Released: 12/26/2004 Document Revised: 09/16/2014 Document Reviewed: 08/07/2012 Elsevier Interactive Patient Education 2016 [employer]   Low-Fat Diet for Gallbladder Conditions A low-fat diet can be helpful if you have pancreatitis or a gallbladder condition. With these conditions, your pancreas and gallbladder have trouble digesting fats. A healthy eating plan with less fat will help rest your pancreas and gallbladder and reduce your symptoms. WHAT DO I NEED TO KNOW ABOUT THIS DIET? Eat a low-fat diet. Reduce your fat intake to less than 20-30% of your total daily calories. This is less than 50-60 g of fat per day. Remember that you need some fat in  your diet. Ask your dietician what your daily goal should be. Choose nonfat and low-fat healthy foods. Look for the words "nonfat," "low fat," or "fat free." As a guide, look on the label and choose foods with less than 3 g of fat per serving. Eat only one serving. Avoid alcohol. Do not smoke. If you need help quitting, talk with your health care provider. Eat small frequent meals instead of three large heavy meals. WHAT FOODS CAN I EAT? Grains Include healthy grains and starches such as potatoes, wheat bread, fiber-rich cereal, and brown rice. Choose whole grain options whenever possible. In adults, whole grains should account for 45-65% of your daily calories.  Fruits and Vegetables Eat plenty of fruits and vegetables. Fresh fruits and vegetables add fiber to your diet. Meats and Other Protein Sources Eat lean meat such as chicken and pork. Trim any fat off of meat before cooking it. Eggs, fish, and beans are other sources of protein. In adults, these foods should account for 10-35% of your daily calories. Dairy Choose low-fat milk and dairy options. Dairy includes fat and protein, as well as calcium.  Fats and Oils Limit high-fat foods such as fried foods, sweets, baked goods, sugary drinks.  Other Creamy sauces and condiments, such as mayonnaise, can add extra fat. Think about whether or not you need to  use them, or use smaller amounts or low fat options. WHAT FOODS ARE NOT RECOMMENDED? High fat foods, such as: [employer]. Ice cream. [place] toast. Sweet rolls. Pizza. Cheese bread. Foods covered with batter, butter, creamy sauces, or cheese. Fried foods. Sugary drinks and desserts. Foods that cause gas or bloating   This information is not intended to replace advice given to you by your health care provider. Make sure you discuss any questions you have with your health care provider.   Document Released: 12/31/2012 Document Reviewed: 12/31/2012 Elsevier Interactive Patient  Education [employer]

## 2023-03-18 ENCOUNTER — Encounter: Payer: Self-pay | Admitting: Surgery

## 2023-03-18 NOTE — Progress Notes (Signed)
 03/16/2023  Reason for Visit:  Cholelithiasis and biliary dyskinesia  Requesting Provider:  Wyline Mood, MD  History of Present Illness: Nicole Young is a 83 y.o. female presenting for evaluation of cholelithiasis and biliary dyskinesia.  She reports that she's had a few episodes of epigastric and RUQ pain that radiates to her back.  She has had nausea/emesis in the past.  She had an ultrasound and HIDA scan on 02/28/23.  This showed cholelithiasis as well as biliary dyskinesia with EF of 9%.  She has made some dietary changes with a low fat diet and has not had issues recently, but she's worried about an episode that could result in emergency surgery.  Currently she only reports having some soreness but denies any worsening pain.  Of note, she also had a positive Cologuard test and has seen Dr. Tobi Bastos about possible colonoscopy.  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[facility])      Past Surgical History: Past Surgical History:  Procedure Laterality Date   AUGMENTATION MAMMAPLASTY  1975   AUGMENTATION MAMMAPLASTY  2017   2nd augmentation and lift   BIOPSY  07/11/2022   Procedure: BIOPSY;  Surgeon: Midge Minium, MD;  Location: [facility] ENDOSCOPY;  Service: Endoscopy;;   BREAST SURGERY     impants replaced with lift   CARPAL TUNNEL RELEASE Right 08/26/2014   Procedure: CARPAL TUNNEL RELEASE;  Surgeon: Deeann Saint, MD;  Location: [facility] ORS;  Service: Orthopedics;  Laterality: Right;   COLONOSCOPY  2005   Dr Lemar Livings   COLONOSCOPY N/A 06/24/2014   Procedure: COLONOSCOPY;   Surgeon: Earline Mayotte, MD;  Location: [facility] ENDOSCOPY;  Service: Endoscopy;  Laterality: N/A;   ESOPHAGOGASTRODUODENOSCOPY (EGD) WITH PROPOFOL N/A 07/11/2022   Procedure: ESOPHAGOGASTRODUODENOSCOPY (EGD) WITH PROPOFOL;  Surgeon: Midge Minium, MD;  Location: [facility] ENDOSCOPY;  Service: Endoscopy;  Laterality: N/A;   PARATHYROIDECTOMY  1992   PLACEMENT OF BREAST IMPLANTS  1975   TONSILECTOMY, ADENOIDECTOMY, BILATERAL MYRINGOTOMY AND TUBES      Home Medications: Prior to Admission medications   Medication Sig Start Date End Date Taking? Authorizing Provider  Accu-Chek FastClix Lancets MISC CHECK SUGAR ONCE DAILY. DX: E11.9 09/20/18  Yes Bosie Clos, MD  ACCU-CHEK SMARTVIEW test strip USE TO CHECK BLOOD SUGAR ONCE A DAY 11/26/20  Yes Bosie Clos, MD  Blood Glucose Monitoring Suppl (ACCU-CHEK GUIDE ME) w/Device KIT  05/27/22  Yes [provider]  cholecalciferol (VITAMIN D3) 25 MCG (1000 UT) tablet Take 600 Units by mouth daily.   Yes [provider]  Cysteamine Bitartrate (PROCYSBI) 300 MG PACK  05/25/22  Yes [provider]  diazepam (VALIUM) 5 MG tablet Take by mouth. 07/19/22  Yes [provider]  estradiol (ESTRACE) 0.1 MG/GM vaginal cream 1 gram intravaginally at bedtime daily for 2-3 weeks, then decrease to 3 times per week 10/30/22  Yes MacDiarmid, Lorin Picket, MD  famotidine (PEPCID) 20 MG tablet Take 20 mg by mouth daily. 05/04/22  Yes [provider]  fluorouracil (EFUDEX) 5 % cream Apply to affected wart at bedtime 03/30/22  Yes Moye, [place], MD  hydroquinone 4 % cream Apply topically 2 (two) times daily. 12/08/20  Yes Willeen Niece, MD  levothyroxine (SYNTHROID) 50 MCG tablet Take 1 tablet (50 mcg total) by mouth daily before breakfast. 12/05/21  Yes Jacky Kindle, FNP  losartan (COZAAR) 25 MG tablet Take 1 tablet by mouth daily. 05/10/22 05/10/23 Yes [provider]  Magnesium 250 MG TABS Take 1 tablet by mouth daily.   Yes  [provider]  metFORMIN (GLUCOPHAGE-XR) 500 MG 24 hr tablet TAKE 1 TABLET TWICE A DAY Patient taking differently: 500 mg 2 (two) times daily before a meal. 05/26/21  Yes Bosie Clos, MD  methocarbamol (ROBAXIN) 500 MG tablet as needed.   Yes [provider]  mometasone (ELOCON) 0.1 % cream Apply 1 application topically See admin instructions. Use 1 - 2 times daily until clear, then use as a spot treatment when needed 02/15/21  Yes Willeen Niece, MD  Multiple Vitamin (MULTIVITAMIN) capsule Take 1 capsule by mouth daily.   Yes [provider]  Multiple Vitamins-Minerals (PRESERVISION AREDS PO) Take by mouth 2 (two) times daily.   Yes [provider]  pantoprazole (PROTONIX) 20 MG tablet Take by mouth. 11/22/22  Yes [provider]  rosuvastatin (CRESTOR) 20 MG tablet TAKE 1 TABLET DAILY 03/30/21  Yes Bosie Clos, MD  sertraline (ZOLOFT) 100 MG tablet TAKE 1 TABLET DAILY Patient taking differently: 50 mg. TAKE 1 TABLET DAILY 04/04/21  Yes Bosie Clos, MD    Allergies: Allergies  Allergen Reactions   Neomycin-Polymyxin-Gramicidin Other (See Comments)    Other Reaction: Other reaction   Clarithromycin Other (See Comments)    Dizziness   Neosporin [Neomycin-Bacitracin Zn-Polymyx] Swelling   Sulfa Antibiotics Other (See Comments)   Thimerosal (Thiomersal) Swelling   Zostavax  [Zoster Vaccine Live] Rash    Do Not Administer Zostavax to Pt!    Social History:  reports that she has never smoked. She has never used smokeless tobacco. She reports current alcohol use of about 1.0 standard drink of alcohol per week. She reports that she does not use drugs.   Family History: Family History  Problem Relation Age of Onset   Cancer Father        prostate   Prostate cancer Father    Hypertension Mother    Heart attack Mother    Heart attack Maternal Grandfather    Diabetes Paternal Uncle    Congestive Heart Failure Maternal  Grandmother    CVA Paternal Grandmother    Prostate cancer Paternal Grandfather    Breast cancer Maternal Aunt     Review of Systems: Review of Systems  Constitutional:  Negative for chills and fever.  HENT:  Negative for hearing loss.   Respiratory:  Negative for shortness of breath.   Cardiovascular:  Negative for chest pain.  Gastrointestinal:  Positive for abdominal pain, nausea and vomiting.  Genitourinary:  Negative for dysuria.  Musculoskeletal:  Positive for back pain.  Skin:  Negative for rash.  Neurological:  Negative for dizziness.  Psychiatric/Behavioral:  Negative for depression.     Physical Exam BP 136/70   Pulse 86   Ht 5\' 1"  (1.549 m)   Wt 143 lb 3.2 oz (65 kg)   SpO2 97%   BMI 27.06 kg/m  CONSTITUTIONAL: No acute distress, well nourished. HEENT:  Normocephalic, atraumatic, extraocular motion intact. NECK: Trachea is midline, and there is no  jugular venous distension.  RESPIRATORY:  Lungs are clear, and breath sounds are equal bilaterally. Normal respiratory effort without pathologic use of accessory muscles. CARDIOVASCULAR: Heart is regular without murmurs, gallops, or rubs. GI: The abdomen is soft, non-distended, currently with mild soreness in epigastric/RUQ areas.  Negative Murphy's sign.  MUSCULOSKELETAL:  Normal muscle strength and tone in all four extremities.  No peripheral edema or cyanosis. SKIN: Skin turgor is normal. There are no pathologic skin lesions.  NEUROLOGIC:  Motor and sensation is grossly normal.  Cranial nerves are grossly intact. PSYCH:  Alert and oriented to person, place and time. Affect is normal.  Laboratory Analysis: Labs from 02/12/23: Na 136, K 4, Cl 98, CO2 22, BUN 20, Cr 0.69.  Total bili 0.4, AST 19, ALT 17, Alk Phos 42, albumin 4.5.  WBC 9.1, Hgb 12.9, Hct 38.6, Plt 255.  Imaging: Ultrasound RUQ on 02/28/23: IMPRESSION: Cholelithiasis without secondary signs of acute cholecystitis.  HIDA scan on  02/28/23: IMPRESSION: Reduced gallbladder ejection fraction as can be seen with chronic cholecystitis/biliary dyskinesia  Assessment and Plan: This is a 83 y.o. female with cholelithiasis and biliary dyskinesia.  --Discussed with the patient the findings on her imaging studies showing both cholelithiasis and biliary dyskinesia.  Discussed with her how these can results in episodes of biliary colic.  Although she has been doing somewhat better with a low fat diet, she is interested in surgery to prevent any episodes of cholecystitis that would require a more emergency/urgent type of surgery.  At the same time, she is wondering about getting a colonoscopy given the positive Cologuard.  She will contact Dr. Johnney Killian office to inquire about potential dates.  Discussed with her that we could do her surgery before, or after her colonoscopy, but these could not be combined for the same date.   --Discussed with her then the plan for a robotic assisted cholecystectomy.  Reviewed the surgery at length with her including the planned incisions, the risks of bleeding, infection, injury to surrounding structures, the use of ICG to better evaluate the biliary anatomy, that this would be an outpatient surgery, post-operative activity restrictions, pain control, and she's willing to proceed. --Once she knows better about the timing for colonoscopy, she will contact us so we can schedule her surgery.  All of her questions have been answered.  In the meantime, will also send for medical clearance.  I spent 55 minutes dedicated to the care of this patient on the date of this encounter to include pre-visit review of records, face-to-face time with the patient discussing diagnosis and management, and any post-visit coordination of care.   Howie Ill, MD [facility]

## 2023-03-19 ENCOUNTER — Telehealth: Payer: Self-pay | Admitting: Surgery

## 2023-03-19 ENCOUNTER — Other Ambulatory Visit: Payer: Self-pay

## 2023-03-19 DIAGNOSIS — R195 Other fecal abnormalities: Secondary | ICD-10-CM

## 2023-03-19 MED ORDER — NA SULFATE-K SULFATE-MG SULF 17.5-3.13-1.6 GM/177ML PO SOLN: 354.0000 mL | Freq: Once | ORAL | 0 refills | Status: AC

## 2023-03-19 NOTE — Telephone Encounter (Signed)
 Called patient back and she stated that she had seen Dr. Aleen Campi for gallbladder removal and that he told her that he would prefer to have her colonoscopy done first and then remove her gallbladder in case anything else needed to be done. Nicole Young wanted to do the first available. So, it will be on 04/11/2023. However, patient wanted to make sure with Dr. Tobi Bastos if he was ok to proceed with her colonoscopy. Please advise.

## 2023-03-19 NOTE — Progress Notes (Signed)
 Suprep

## 2023-03-19 NOTE — Addendum Note (Signed)
 Addended by: Adela Ports on: 03/19/2023 03:19 PM   Modules accepted: Orders

## 2023-03-19 NOTE — Telephone Encounter (Signed)
 Patient awaiting to have colonoscopy done first before having surgery with Dr. Aleen Campi for robotic cholecystectomy.  She will call and let us know when colonoscopy is scheduled.  Patient last saw Dr. Aleen Campi for office visit 03/16/23.

## 2023-03-19 NOTE — Progress Notes (Unsigned)
 Medical Clearance has been received from Dr Welton Flakes. The patient is cleared at low risk for surgery. Note, patient has a history of slow recovery from anesthesia in the past.

## 2023-03-20 ENCOUNTER — Telehealth: Payer: Self-pay | Admitting: Surgery

## 2023-03-20 NOTE — Telephone Encounter (Signed)
 Patient has been advised of Pre-Admission date/time, and Surgery date at [facility].  Surgery Date: 04/19/23 Preadmission Testing Date: 04/09/23 (phone 8a-1p)  Patient has been made aware to call [phone], between 1-3:00pm the day before surgery, to find out what time to arrive for surgery.

## 2023-03-26 ENCOUNTER — Telehealth: Payer: Self-pay | Admitting: Internal Medicine

## 2023-03-26 NOTE — Telephone Encounter (Signed)
 Patient left VM that she wants a return call for an Rx that she needs but did not state which Rx it is.

## 2023-03-26 NOTE — Telephone Encounter (Signed)
 Patient is aware and she had already scheduled her colonoscopy for 04/11/2023. Patient had no further questions.

## 2023-04-03 NOTE — Telephone Encounter (Signed)
 Patient called and she states it wasn't for an rx but it was for her CPAP but she got it taken care of

## 2023-04-05 ENCOUNTER — Encounter: Payer: Medicare Other | Admitting: Dermatology

## 2023-04-09 ENCOUNTER — Encounter: Payer: Self-pay | Admitting: Urgent Care

## 2023-04-09 ENCOUNTER — Other Ambulatory Visit: Payer: Self-pay

## 2023-04-09 ENCOUNTER — Encounter
Admission: RE | Admit: 2023-04-09 | Discharge: 2023-04-09 | Disposition: A | Discharge status: Home / Self Care | Source: Ambulatory Visit | Attending: Surgery | Admitting: Surgery

## 2023-04-09 VITALS — Ht 60.0 in | Wt 143.0 lb

## 2023-04-09 DIAGNOSIS — E119 Type 2 diabetes mellitus without complications: Secondary | ICD-10-CM

## 2023-04-09 DIAGNOSIS — Z01812 Encounter for preprocedural laboratory examination: Secondary | ICD-10-CM

## 2023-04-09 DIAGNOSIS — Z01810 Encounter for preprocedural cardiovascular examination: Secondary | ICD-10-CM

## 2023-04-09 DIAGNOSIS — E1159 Type 2 diabetes mellitus with other circulatory complications: Secondary | ICD-10-CM

## 2023-04-09 HISTORY — DX: Allergic rhinitis, unspecified: J30.9

## 2023-04-09 HISTORY — DX: Sleep apnea, unspecified: G47.30

## 2023-04-09 HISTORY — DX: Gastro-esophageal reflux disease without esophagitis: K21.9

## 2023-04-09 NOTE — Patient Instructions (Addendum)
 Your procedure is scheduled on:  Thursday April 10  Report to the Registration Desk on the 1st floor of the [facility] To find out your arrival time, please call [phone] between 1PM - 3PM on:  Wednesday April 9  If your arrival time is 6:00 am, do not arrive before that time as the [facility] entrance doors do not open until 6:00 am.  REMEMBER: Instructions that are not followed completely may result in serious medical risk, up to and including death; or upon the discretion of your surgeon and anesthesiologist your surgery may need to be rescheduled.  Do not eat food after midnight the night before surgery.  No gum chewing or hard candies.  You may however, drink WATER up to 2 hours before you are scheduled to arrive for your surgery. Do not drink anything within 2 hours of your scheduled arrival time.  One week prior to surgery: Thursday April 3  Stop Anti-inflammatories (NSAIDS) such as Advil, Aleve, Ibuprofen, Motrin, Naproxen, Naprosyn and Aspirin based products such as Excedrin, Goody's Powder, BC Powder. Stop ANY OVER THE COUNTER supplements until after surgery. ascorbic acid (VITAMIN C)  Cholecalciferol (VITAMIN D)  Magnesium  Multiple Vitamins-Minerals  You may however, continue to take Tylenol if needed for pain up until the day of surgery.  **Follow guidelines for insulin and diabetes medications.** metFORMIN (GLUCOPHAGE-XR) hold 2 days prior to surgery last dose Monday April 7   Continue taking all of your other prescription medications up until the day of surgery.  ON THE DAY OF SURGERY ONLY TAKE THESE MEDICATIONS WITH SIPS OF WATER:  famotidine (PEPCID)  levothyroxine (SYNTHROID)     No Alcohol for 24 hours before or after surgery.  No Smoking including e-cigarettes for 24 hours before surgery.  No chewable tobacco products for at least 6 hours before surgery.  No nicotine patches on the day of surgery.  Do not use any "recreational" drugs for at least  a week (preferably 2 weeks) before your surgery.  Please be advised that the combination of cocaine and anesthesia may have negative outcomes, up to and including death. If you test positive for cocaine, your surgery will be cancelled.  On the morning of surgery brush your teeth with toothpaste and water, you may rinse your mouth with mouthwash if you wish. Do not swallow any toothpaste or mouthwash.  Use CHG Soap as directed on instruction sheet.  Do not wear jewelry, make-up, hairpins, clips or nail polish.  For welded (permanent) jewelry: bracelets, anklets, waist bands, etc.  Please have this removed prior to surgery.  If it is not removed, there is a chance that hospital personnel will need to cut it off on the day of surgery.  Do not wear lotions, powders, or perfumes.   Do not shave body hair from the neck down 48 hours before surgery.  Contact lenses, hearing aids and dentures may not be worn into surgery.  Do not bring valuables to the hospital. [facility] is not responsible for any missing/lost belongings or valuables.   Notify your doctor if there is any change in your medical condition (cold, fever, infection).  Wear comfortable clothing (specific to your surgery type) to the hospital.  After surgery, you can help prevent lung complications by doing breathing exercises.  Take deep breaths and cough every 1-2 hours.   When coughing or sneezing, hold a pillow firmly against your incision with both hands. This is called "splinting." Doing this helps protect your incision. It  also decreases belly discomfort.  If you are being discharged the day of surgery, you will not be allowed to drive home. You will need a responsible individual to drive you home and stay with you for 24 hours after surgery.   If you are taking public transportation, you will need to have a responsible individual with you.  Please call the Pre-admissions Testing Dept. at [phone] if you have any  questions about these instructions.  Surgery Visitation Policy:  Patients having surgery or a procedure may have two visitors.  Children under the age of 50 must have an adult with them who is not the patient.       Preparing for Surgery with CHLORHEXIDINE GLUCONATE (CHG) Soap  Chlorhexidine Gluconate (CHG) Soap  o An antiseptic cleaner that kills germs and bonds with the skin to continue killing germs even after washing  o Used for showering the night before surgery and morning of surgery  Before surgery, you can play an important role by reducing the number of germs on your skin.  CHG (Chlorhexidine gluconate) soap is an antiseptic cleanser which kills germs and bonds with the skin to continue killing germs even after washing.  Please do not use if you have an allergy to CHG or antibacterial soaps. If your skin becomes reddened/irritated stop using the CHG.  1. Shower the NIGHT BEFORE SURGERY and the MORNING OF SURGERY with CHG soap.  2. If you choose to wash your hair, wash your hair first as usual with your normal shampoo.  3. After shampooing, rinse your hair and body thoroughly to remove the shampoo.  4. Use CHG as you would any other liquid soap. You can apply CHG directly to the skin and wash gently with a scrungie or a clean washcloth.  5. Apply the CHG soap to your body only from the neck down. Do not use on open wounds or open sores. Avoid contact with your eyes, ears, mouth, and genitals (private parts). Wash face and genitals (private parts) with your normal soap.  6. Wash thoroughly, paying special attention to the area where your surgery will be performed.  7. Thoroughly rinse your body with warm water.  8. Do not shower/wash with your normal soap after using and rinsing off the CHG soap.  9. Pat yourself dry with a clean towel.  10. Wear clean pajamas to bed the night before surgery.  12. Place clean sheets on your bed the night of your first shower and do  not sleep with pets.  13. Shower again with the CHG soap on the day of surgery prior to arriving at the hospital.  14. Do not apply any deodorants/lotions/powders.  15. Please wear clean clothes to the hospital.

## 2023-04-10 ENCOUNTER — Encounter: Payer: Self-pay | Admitting: Gastroenterology

## 2023-04-10 ENCOUNTER — Telehealth: Payer: Self-pay | Admitting: Gastroenterology

## 2023-04-10 NOTE — Telephone Encounter (Signed)
 Pt has colonoscopy question

## 2023-04-11 ENCOUNTER — Encounter (INDEPENDENT_AMBULATORY_CARE_PROVIDER_SITE_OTHER): Payer: Self-pay

## 2023-04-11 ENCOUNTER — Ambulatory Visit: Payer: Self-pay | Admitting: Urgent Care

## 2023-04-11 ENCOUNTER — Ambulatory Visit
Admission: RE | Admit: 2023-04-11 | Discharge: 2023-04-11 | Disposition: A | Discharge status: Home / Self Care | Source: Ambulatory Visit | Attending: Gastroenterology | Admitting: Gastroenterology

## 2023-04-11 ENCOUNTER — Other Ambulatory Visit: Payer: Self-pay

## 2023-04-11 ENCOUNTER — Encounter
Admission: RE | Disposition: A | Discharge status: Home / Self Care | Payer: Self-pay | Source: Ambulatory Visit | Attending: Gastroenterology

## 2023-04-11 DIAGNOSIS — D123 Benign neoplasm of transverse colon: Secondary | ICD-10-CM | POA: Diagnosis not present

## 2023-04-11 DIAGNOSIS — K5730 Diverticulosis of large intestine without perforation or abscess without bleeding: Secondary | ICD-10-CM | POA: Insufficient documentation

## 2023-04-11 DIAGNOSIS — R195 Other fecal abnormalities: Secondary | ICD-10-CM | POA: Insufficient documentation

## 2023-04-11 DIAGNOSIS — Z7984 Long term (current) use of oral hypoglycemic drugs: Secondary | ICD-10-CM | POA: Insufficient documentation

## 2023-04-11 DIAGNOSIS — K635 Polyp of colon: Secondary | ICD-10-CM | POA: Diagnosis not present

## 2023-04-11 DIAGNOSIS — G4730 Sleep apnea, unspecified: Secondary | ICD-10-CM | POA: Insufficient documentation

## 2023-04-11 DIAGNOSIS — I10 Essential (primary) hypertension: Secondary | ICD-10-CM | POA: Diagnosis not present

## 2023-04-11 DIAGNOSIS — K219 Gastro-esophageal reflux disease without esophagitis: Secondary | ICD-10-CM | POA: Insufficient documentation

## 2023-04-11 DIAGNOSIS — E119 Type 2 diabetes mellitus without complications: Secondary | ICD-10-CM | POA: Insufficient documentation

## 2023-04-11 DIAGNOSIS — D126 Benign neoplasm of colon, unspecified: Secondary | ICD-10-CM

## 2023-04-11 DIAGNOSIS — Z1211 Encounter for screening for malignant neoplasm of colon: Secondary | ICD-10-CM | POA: Insufficient documentation

## 2023-04-11 HISTORY — PX: POLYPECTOMY: SHX5525

## 2023-04-11 HISTORY — PX: COLONOSCOPY: SHX5424

## 2023-04-11 LAB — GLUCOSE, CAPILLARY: Glucose-Capillary: 166 mg/dL — ABNORMAL HIGH (ref 70–99)

## 2023-04-11 SURGERY — COLONOSCOPY
Anesthesia: General

## 2023-04-11 MED ORDER — PROPOFOL 500 MG/50ML IV EMUL
INTRAVENOUS | Status: DC | PRN
  Administered 2023-04-11: 70 ug/kg/min via INTRAVENOUS

## 2023-04-11 MED ORDER — PROPOFOL 10 MG/ML IV BOLUS
INTRAVENOUS | Status: AC
  Filled 2023-04-11: qty 40

## 2023-04-11 MED ORDER — SODIUM CHLORIDE 0.9 % IV SOLN
INTRAVENOUS | Status: DC
Start: 2023-04-11 — End: 2023-04-11

## 2023-04-11 MED ORDER — PROPOFOL 10 MG/ML IV BOLUS
INTRAVENOUS | Status: DC | PRN
  Administered 2023-04-11: 30 mg via INTRAVENOUS
  Administered 2023-04-11: 50 mg via INTRAVENOUS

## 2023-04-11 NOTE — Op Note (Signed)
 [facility]astroenterology Patient Name: Nicole Young Procedure Date: 04/11/2023 8:08 AM MRN: [id number] Account #: [id number] Date of Birth: 1940/07/11 Admit Type: Outpatient Age: 83 Room: [facility] ENDO ROOM 3 Gender: Female Note Status: Finalized Instrument Name: Prentice Docker [id number] Procedure:             Colonoscopy Indications:           Screening for colorectal malignant neoplasm due to                         positive Cologuard test Providers:             Wyline Mood MD, MD Referring MD:          No Local Md, MD (Referring MD) Medicines:             Monitored Anesthesia Care Complications:         No immediate complications. Procedure:             Pre-Anesthesia Assessment:                        - Prior to the procedure, a History and Physical was                         performed, and patient medications, allergies and                         sensitivities were reviewed. The patient's tolerance                         of previous anesthesia was reviewed.                        - The risks and benefits of the procedure and the                         sedation options and risks were discussed with the                         patient. All questions were answered and informed                         consent was obtained.                        - ASA Grade Assessment: II - A patient with mild                         systemic disease.                        After obtaining informed consent, the colonoscope was                         passed under direct vision. Throughout the procedure,                         the patient's blood pressure, pulse, and oxygen                         saturations were  monitored continuously. The                         Colonoscope was introduced through the anus and                         advanced to the the cecum, identified by the                         appendiceal orifice. The colonoscopy was performed                          with ease. The patient tolerated the procedure well.                         The quality of the bowel preparation was good. The                         ileocecal valve, appendiceal orifice, and rectum were                         photographed. Findings:      The perianal and digital rectal examinations were normal.      A 5 mm polyp was found in the transverse colon. The polyp was sessile.       The polyp was removed with a cold snare. Resection and retrieval were       complete.      Multiple medium-mouthed diverticula were found in the sigmoid colon.      The exam was otherwise without abnormality on direct and retroflexion       views. Impression:            - One 5 mm polyp in the transverse colon, removed with                         a cold snare. Resected and retrieved.                        - Diverticulosis in the sigmoid colon.                        - The examination was otherwise normal on direct and                         retroflexion views. Recommendation:        - Discharge patient to home (with escort).                        - Resume previous diet.                        - Continue present medications.                        - Await pathology results.                        - Repeat colonoscopy is not recommended due to current  age (50 years or older) for surveillance. Procedure Code(s):     --- Professional ---                        [phone], Colonoscopy, flexible; with removal of                         tumor(s), polyp(s), or other lesion(s) by snare                         technique Diagnosis Code(s):     --- Professional ---                        Z12.11, Encounter for screening for malignant neoplasm                         of colon                        R19.5, Other fecal abnormalities                        D12.3, Benign neoplasm of transverse colon (hepatic                         flexure or splenic flexure)                         K57.30, Diverticulosis of large intestine without                         perforation or abscess without bleeding CPT copyright 2022 American Medical Association. All rights reserved. The codes documented in this report are preliminary and upon coder review may  be revised to meet current compliance requirements. Wyline Mood, MD Wyline Mood MD, MD 04/11/2023 8:56:36 AM This report has been signed electronically. Number of Addenda: 0 Note Initiated On: 04/11/2023 8:08 AM Scope Withdrawal Time: 0 hours 12 minutes 13 seconds  Total Procedure Duration: 0 hours 17 minutes 5 seconds  Estimated Blood Loss:  Estimated blood loss: none.      [facility]

## 2023-04-11 NOTE — Anesthesia Postprocedure Evaluation (Signed)
 Anesthesia Post Note  Patient: Nicole Young  Procedure(s) Performed: COLONOSCOPY POLYPECTOMY  Patient location during evaluation: PACU Anesthesia Type: General Level of consciousness: awake and alert Pain management: pain level controlled Vital Signs Assessment: post-procedure vital signs reviewed and stable Respiratory status: spontaneous breathing, nonlabored ventilation, respiratory function stable and patient connected to nasal cannula oxygen Cardiovascular status: blood pressure returned to baseline and stable Postop Assessment: no apparent nausea or vomiting Anesthetic complications: no   No notable events documented.   Last Vitals:  Vitals:   04/11/23 0911 04/11/23 0921  BP: 138/60 (!) 140/67  Pulse: 72 64  Resp: 16 15  Temp:    SpO2: 98% 100%    Last Pain:  Vitals:   04/11/23 0921  TempSrc:   PainSc: 0-No pain                 Corinda Gubler

## 2023-04-11 NOTE — H&P (Signed)
 Wyline Mood, MD [address], [place], [place], [address] [address], [address], [place], [place], [address] Phone: [phone]  Fax: [phone]  Primary Care Physician:  Margaretann Loveless, MD   Pre-Procedure History & Physical: HPI:  Nicole Young is a 83 y.o. female is here for an colonoscopy.   Past Medical History:  Diagnosis Date   Acute bacterial sinusitis 11/18/2021   Allergic rhinitis    AMD (acid maltase deficiency) (HCC)    Anterior knee pain 02/10/2022   Arthralgia of multiple joints 11/12/2014   Biliary dyskinesia 2025   Bursitis of hip 02/10/2022   Carpal tunnel syndrome 06/30/2014   Cervical arthritis 07/06/2016   Cholelithiasis 2025   Complication of anesthesia    slow to wake up in past   Depression    controlled   Diabetes mellitus without complication (HCC)    controlled   GERD (gastroesophageal reflux disease)    Heart palpitations 07/06/2020   Hyperlipidemia    Hypertension    Hypothyroidism    Impingement syndrome of left shoulder 02/25/2021   Iron deficiency 01/15/2023   Lipoma of foot 02/10/2022   Neuropathy    Osteoarthritis    knee, ankles, shoulders, back, neck, hands/fingers, wrists;    Osteoarthritis of knee 10/10/2017   Premature ventricular contractions 08/09/2020   Pure hypercholesterolemia 07/06/2020   Rectocele    Sleep apnea    Thyroid disease    Vaginal atrophy 08/16/2018   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[facility])     Past Surgical History:  Procedure Laterality Date   AUGMENTATION MAMMAPLASTY  1975   AUGMENTATION MAMMAPLASTY  2017   2nd augmentation and lift   BIOPSY  07/11/2022   Procedure: BIOPSY;  Surgeon: Midge Minium, MD;  Location: [facility] ENDOSCOPY;  Service: Endoscopy;;   BREAST SURGERY     impants replaced with lift   CARPAL TUNNEL RELEASE Right 08/26/2014   Procedure: CARPAL TUNNEL RELEASE;  Surgeon: Deeann Saint, MD;  Location: [facility] ORS;  Service: Orthopedics;  Laterality: Right;   COLONOSCOPY   2005   Dr Lemar Livings   COLONOSCOPY N/A 06/24/2014   Procedure: COLONOSCOPY;  Surgeon: Earline Mayotte, MD;  Location: [facility] ENDOSCOPY;  Service: Endoscopy;  Laterality: N/A;   ESOPHAGOGASTRODUODENOSCOPY (EGD) WITH PROPOFOL N/A 07/11/2022   Procedure: ESOPHAGOGASTRODUODENOSCOPY (EGD) WITH PROPOFOL;  Surgeon: Midge Minium, MD;  Location: [facility] ENDOSCOPY;  Service: Endoscopy;  Laterality: N/A;   PARATHYROIDECTOMY  1992   PLACEMENT OF BREAST IMPLANTS  1975   TONSILECTOMY, ADENOIDECTOMY, BILATERAL MYRINGOTOMY AND TUBES      Prior to Admission medications   Medication Sig Start Date End Date Taking? Authorizing Provider  Accu-Chek FastClix Lancets MISC CHECK SUGAR ONCE DAILY. DX: E11.9 09/20/18  Yes Bosie Clos, MD  ACCU-CHEK SMARTVIEW test strip USE TO CHECK BLOOD SUGAR ONCE A DAY 11/26/20  Yes Bosie Clos, MD  ascorbic acid (VITAMIN C) 500 MG tablet Take 500 mg by mouth daily.   Yes [provider]  Blood Glucose Monitoring Suppl (ACCU-CHEK GUIDE ME) w/Device KIT  05/27/22  Yes [provider]  Cholecalciferol (VITAMIN D) 50 MCG (2000 UT) tablet Take 2,000 Units by mouth daily.   Yes [provider]  estradiol (ESTRACE) 0.1 MG/GM vaginal cream 1 gram intravaginally at bedtime daily for 2-3 weeks, then decrease to 3 times per week Patient taking differently: Place 1 Applicatorful vaginally 3 (three) times a week. 10/30/22  Yes MacDiarmid, Lorin Picket, MD  famotidine (PEPCID) 20 MG tablet Take  20 mg by mouth daily as needed for heartburn. 05/04/22  Yes [provider]  hydroquinone 4 % cream Apply topically 2 (two) times daily. 12/08/20  Yes Willeen Niece, MD  levothyroxine (SYNTHROID) 50 MCG tablet Take 1 tablet (50 mcg total) by mouth daily before breakfast. 12/05/21  Yes Jacky Kindle, FNP  losartan (COZAAR) 25 MG tablet Take 1 tablet by mouth at bedtime. 05/10/22 05/10/23 Yes [provider]  Magnesium 200 MG TABS Take 200 mg by mouth 4 (four) times a  week.   Yes [provider]  metFORMIN (GLUCOPHAGE-XR) 500 MG 24 hr tablet TAKE 1 TABLET TWICE A DAY Patient taking differently: Take 2,000 mg by mouth daily with supper. 05/26/21  Yes Bosie Clos, MD  methocarbamol (ROBAXIN) 500 MG tablet Take 500 mg by mouth as needed for muscle spasms.   Yes [provider]  mometasone (ELOCON) 0.1 % cream Apply 1 application topically See admin instructions. Use 1 - 2 times daily until clear, then use as a spot treatment when needed Patient taking differently: Apply 1 Application topically daily as needed (eczema in ears). 02/15/21  Yes Willeen Niece, MD  Multiple Vitamins-Minerals (PRESERVISION AREDS PO) Take 1 tablet by mouth daily.   Yes [provider]  rosuvastatin (CRESTOR) 20 MG tablet TAKE 1 TABLET DAILY 03/30/21  Yes Bosie Clos, MD  sertraline (ZOLOFT) 100 MG tablet TAKE 1 TABLET DAILY Patient taking differently: Take 50 mg by mouth daily. 04/04/21  Yes Bosie Clos, MD  diazepam (VALIUM) 5 MG tablet Take 2.5 mg by mouth daily as needed for anxiety. 07/19/22   [provider]    Allergies as of 03/19/2023 - Review Complete 03/18/2023  Allergen Reaction Noted   Neomycin-polymyxin-gramicidin Other (See Comments) 11/17/2014   Clarithromycin Other (See Comments) 07/27/2022   Neosporin [neomycin-bacitracin zn-polymyx] Swelling 08/22/2013   Sulfa antibiotics Other (See Comments) 12/11/2018   Thimerosal (thiomersal) Swelling 04/16/2014   Zostavax  [zoster vaccine live] Rash 11/12/2014    Family History  Problem Relation Age of Onset   Cancer Father        prostate   Prostate cancer Father    Hypertension Mother    Heart attack Mother    Heart attack Maternal Grandfather    Diabetes Paternal Uncle    Congestive Heart Failure Maternal Grandmother    CVA Paternal Grandmother    Prostate cancer Paternal Grandfather    Breast cancer Maternal Aunt     Social History   Socioeconomic History    Marital status: Widowed    Spouse name: Not on file   Number of children: 3   Years of education: Not on file   Highest education level: Bachelor's degree (e.g., BA, AB, BS)  Occupational History   Occupation: retired  Tobacco Use   Smoking status: Never   Smokeless tobacco: Never  Vaping Use   Vaping status: Never Used  Substance and Sexual Activity   Alcohol use: Yes    Alcohol/week: 1.0 standard drink of alcohol    Types: 1 Glasses of wine per week   Drug use: No   Sexual activity: Not on file  Other Topics Concern   Not on file  Social History Narrative   Not on file   Social Drivers of Health   Financial Resource Strain: Low Risk  (07/19/2022)   Received from [facility]   Overall Financial Resource Strain (CARDIA)    Difficulty of Paying Living Expenses: Not hard at all  Food Insecurity: No Food Insecurity (07/19/2022)   Received from [facility]   Hunger Vital Sign    Worried About Running Out of Food in the Last Year: Never true    Ran Out of Food in the Last Year: Never true  Transportation Needs: No Transportation Needs (07/19/2022)   Received from [facility] - Transportation    In the past 12 months, has lack of transportation kept you from medical appointments or from getting medications?: No    Lack of Transportation (Non-Medical): No  Physical Activity: Insufficiently Active (07/19/2022)   Received from [facility]   Exercise Vital Sign    Days of Exercise per Week: 3 days    Minutes of Exercise per Session: 30 min  Stress: No Stress Concern Present (02/22/2021)   [employer] of Occupational Health - Occupational Stress Questionnaire    Feeling of Stress : Not at all  Social Connections: Socially Isolated (02/22/2021)   Social Connection and Isolation Panel [NHANES]    Frequency of Communication with Friends and Family: More than three times a week    Frequency of Social  Gatherings with Friends and Family: More than three times a week    Attends Religious Services: Never    Database administrator or Organizations: No    Attends Banker Meetings: Never    Marital Status: Widowed  Intimate Partner Violence: Not At Risk (02/22/2021)   Humiliation, Afraid, Rape, and Kick questionnaire    Fear of Current or Ex-Partner: No    Emotionally Abused: No    Physically Abused: No    Sexually Abused: No    Review of Systems: See HPI, otherwise negative ROS  Physical Exam: BP (!) 154/71   Pulse 78   Temp (!) 96.3 F (35.7 C) (Temporal)   Resp 16   Ht 5' (1.524 m)   Wt 64 kg   SpO2 97%   BMI 27.54 kg/m  General:   Alert,  pleasant and cooperative in NAD Head:  Normocephalic and atraumatic. Neck:  Supple; no masses or thyromegaly. Lungs:  Clear throughout to auscultation, normal respiratory effort.    Heart:  +S1, +S2, Regular rate and rhythm, No edema. Abdomen:  Soft, nontender and nondistended. Normal bowel sounds, without guarding, and without rebound.   Neurologic:  Alert and  oriented x4;  grossly normal neurologically.  Impression/Plan: Nicole Young is here for an colonoscopy to be performed for Screening colonoscopy positive cologuard Risks, benefits, limitations, and alternatives regarding  colonoscopy have been reviewed with the patient.  Questions have been answered.  All parties agreeable.   Wyline Mood, MD  04/11/2023, 8:26 AM

## 2023-04-11 NOTE — Anesthesia Preprocedure Evaluation (Signed)
 Anesthesia Evaluation  Patient identified by MRN, date of birth, ID band Patient awake    Reviewed: Allergy & Precautions, H&P , NPO status , Patient's Chart, lab work & pertinent test results  History of Anesthesia Complications Negative for: history of anesthetic complications  Airway Mallampati: II  TM Distance: <3 FB Neck ROM: full    Dental no notable dental hx. (+) Teeth Intact   Pulmonary sleep apnea , neg COPD, Patient abstained from smoking.Not current smoker   Pulmonary exam normal breath sounds clear to auscultation       Cardiovascular Exercise Tolerance: Good METShypertension, (-) CAD and (-) Past MI Normal cardiovascular exam(-) dysrhythmias  Rhythm:Regular Rate:Normal - Systolic murmurs    Neuro/Psych  PSYCHIATRIC DISORDERS  Depression    negative neurological ROS     GI/Hepatic Neg liver ROS,GERD  Medicated,,  Endo/Other  diabetes, Well Controlled, Oral Hypoglycemic AgentsHypothyroidism    Renal/GU negative Renal ROS  negative genitourinary   Musculoskeletal  (+) Arthritis ,    Abdominal Normal abdominal exam  (+)   Peds  Hematology negative hematology ROS (+)   Anesthesia Other Findings Past Medical History: No date: AMD (acid maltase deficiency) (HCC) No date: Complication of anesthesia     Comment:  slow to wake up in past No date: Depression     Comment:  controlled No date: Diabetes mellitus without complication (HCC)     Comment:  controlled No date: Hyperlipidemia No date: Hypertension No date: Hypothyroidism No date: Neuropathy No date: Osteoarthritis     Comment:  knee, ankles, shoulders, back, neck, hands/fingers,               wrists;  No date: Rectocele No date: Thyroid disease No date: 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[facility])  Past Surgical History: 1975: AUGMENTATION MAMMAPLASTY 2017: AUGMENTATION MAMMAPLASTY     Comment:  2nd augmentation and lift No date: BREAST SURGERY      Comment:  impants replaced with lift 08/26/2014: CARPAL TUNNEL RELEASE; Right     Comment:  Procedure: CARPAL TUNNEL RELEASE;  Surgeon: Deeann Saint, MD;  Location: [facility] ORS;  Service: Orthopedics;                Laterality: Right; 2005: COLONOSCOPY     Comment:  Dr Lemar Livings 06/24/2014: COLONOSCOPY; N/A     Comment:  Procedure: COLONOSCOPY;  Surgeon: Earline Mayotte, MD;              Location: [facility] ENDOSCOPY;  Service: Endoscopy;                Laterality: N/A; 1992: PARATHYROIDECTOMY 1975: PLACEMENT OF BREAST IMPLANTS No date: TONSILECTOMY, ADENOIDECTOMY, BILATERAL MYRINGOTOMY AND TUBES  BMI    Body Mass Index: 27.15 kg/m      Reproductive/Obstetrics negative OB ROS                             Anesthesia Physical Anesthesia Plan  ASA: 2  Anesthesia Plan: General   Post-op Pain Management: Minimal or no pain anticipated   Induction: Intravenous  PONV Risk Score and Plan: 3 and Propofol infusion and TIVA  Airway Management Planned: Natural Airway  Additional Equipment: None  Intra-op Plan:   Post-operative Plan:   Informed Consent: I have reviewed the patients History and Physical, chart, labs and discussed the procedure including the risks, benefits and alternatives for the proposed  anesthesia with the patient or authorized representative who has indicated his/her understanding and acceptance.     Dental Advisory Given  Plan Discussed with: CRNA and Surgeon  Anesthesia Plan Comments: (Discussed risks of anesthesia with patient, including possibility of difficulty with spontaneous ventilation under anesthesia necessitating airway intervention, PONV, and rare risks such as cardiac or respiratory or neurological events, and allergic reactions. Discussed the role of CRNA in patient's perioperative care. Patient understands.)        Anesthesia Quick Evaluation

## 2023-04-11 NOTE — Transfer of Care (Signed)
 Immediate Anesthesia Transfer of Care Note  Patient: Nicole Young  Procedure(s) Performed: COLONOSCOPY POLYPECTOMY  Patient Location: PACU  Anesthesia Type:MAC  Level of Consciousness: awake  Airway & Oxygen Therapy: Patient Spontanous Breathing  Post-op Assessment: Report given to RN and Post -op Vital signs reviewed and stable  Post vital signs: Reviewed and stable  Last Vitals:  Vitals Value Taken Time  BP    Temp 36.1 C 04/11/23 0901  Pulse    Resp    SpO2      Last Pain:  Vitals:   04/11/23 0901  TempSrc: Temporal  PainSc:          Complications: No notable events documented.

## 2023-04-12 ENCOUNTER — Encounter: Payer: Self-pay | Admitting: Gastroenterology

## 2023-04-12 LAB — SURGICAL PATHOLOGY

## 2023-04-13 ENCOUNTER — Encounter
Admission: RE | Admit: 2023-04-13 | Discharge: 2023-04-13 | Disposition: A | Discharge status: Home / Self Care | Source: Ambulatory Visit | Attending: Surgery | Admitting: Surgery

## 2023-04-13 DIAGNOSIS — Z01810 Encounter for preprocedural cardiovascular examination: Secondary | ICD-10-CM | POA: Insufficient documentation

## 2023-04-13 DIAGNOSIS — E1159 Type 2 diabetes mellitus with other circulatory complications: Secondary | ICD-10-CM | POA: Diagnosis not present

## 2023-04-13 DIAGNOSIS — I152 Hypertension secondary to endocrine disorders: Secondary | ICD-10-CM | POA: Diagnosis not present

## 2023-04-16 ENCOUNTER — Telehealth: Payer: Self-pay | Admitting: Surgery

## 2023-04-16 ENCOUNTER — Ambulatory Visit (INDEPENDENT_AMBULATORY_CARE_PROVIDER_SITE_OTHER): Admitting: Podiatry

## 2023-04-16 DIAGNOSIS — Z91198 Patient's noncompliance with other medical treatment and regimen for other reason: Secondary | ICD-10-CM

## 2023-04-16 NOTE — Progress Notes (Signed)
 1. Failure to attend appointment with reason given    Patient sick. Will call back to reschedule.

## 2023-04-16 NOTE — Telephone Encounter (Signed)
 Updated information regarding rescheduled surgery.   Patient has been advised of Pre-Admission date/time, and Surgery date at [facility].  Surgery Date: 04/26/23 Preadmission Testing Date: Preadmissions to call patient for appt.    Patient informed of the scheduling process and given information regarding surgery.   Patient has been made aware to call [phone], between 1-3:00pm the day before surgery, to find out what time to arrive for surgery.

## 2023-04-23 ENCOUNTER — Ambulatory Visit: Payer: Medicare Other | Admitting: Podiatry

## 2023-04-23 ENCOUNTER — Emergency Department

## 2023-04-23 ENCOUNTER — Other Ambulatory Visit: Payer: Self-pay

## 2023-04-23 DIAGNOSIS — Z7984 Long term (current) use of oral hypoglycemic drugs: Secondary | ICD-10-CM | POA: Insufficient documentation

## 2023-04-23 DIAGNOSIS — K8012 Calculus of gallbladder with acute and chronic cholecystitis without obstruction: Secondary | ICD-10-CM | POA: Diagnosis not present

## 2023-04-23 DIAGNOSIS — Z79899 Other long term (current) drug therapy: Secondary | ICD-10-CM | POA: Diagnosis not present

## 2023-04-23 DIAGNOSIS — E039 Hypothyroidism, unspecified: Secondary | ICD-10-CM | POA: Insufficient documentation

## 2023-04-23 DIAGNOSIS — E119 Type 2 diabetes mellitus without complications: Secondary | ICD-10-CM | POA: Diagnosis not present

## 2023-04-23 DIAGNOSIS — I10 Essential (primary) hypertension: Secondary | ICD-10-CM | POA: Insufficient documentation

## 2023-04-23 DIAGNOSIS — R1011 Right upper quadrant pain: Secondary | ICD-10-CM | POA: Diagnosis present

## 2023-04-23 DIAGNOSIS — E785 Hyperlipidemia, unspecified: Secondary | ICD-10-CM | POA: Insufficient documentation

## 2023-04-23 LAB — COMPREHENSIVE METABOLIC PANEL WITH GFR
ALT: 15 U/L (ref 0–44)
AST: 19 U/L (ref 15–41)
Albumin: 4.3 g/dL (ref 3.5–5.0)
Alkaline Phosphatase: 33 U/L — ABNORMAL LOW (ref 38–126)
Anion gap: 11 (ref 5–15)
BUN: 17 mg/dL (ref 8–23)
CO2: 24 mmol/L (ref 22–32)
Calcium: 9.4 mg/dL (ref 8.9–10.3)
Chloride: 98 mmol/L (ref 98–111)
Creatinine, Ser: 0.67 mg/dL (ref 0.44–1.00)
GFR, Estimated: >60 mL/min (ref >60)
Glucose, Bld: 153 mg/dL — ABNORMAL HIGH (ref 70–99)
Potassium: 3.5 mmol/L (ref 3.5–5.1)
Sodium: 133 mmol/L — ABNORMAL LOW (ref 135–145)
Total Bilirubin: 0.6 mg/dL (ref 0.0–1.2)
Total Protein: 6.9 g/dL (ref 6.5–8.1)

## 2023-04-23 LAB — TROPONIN I (HIGH SENSITIVITY): Troponin I (High Sensitivity): 5 ng/L (ref <18)

## 2023-04-23 LAB — CBC WITH DIFFERENTIAL/PLATELET
Abs Immature Granulocytes: 0.11 10*3/uL — ABNORMAL HIGH (ref 0.00–0.07)
Basophils Absolute: 0.1 10*3/uL (ref 0.0–0.1)
Basophils Relative: 1 %
Eosinophils Absolute: 0.1 10*3/uL (ref 0.0–0.5)
Eosinophils Relative: 1 %
HCT: 35.5 % — ABNORMAL LOW (ref 36.0–46.0)
Hemoglobin: 11.8 g/dL — ABNORMAL LOW (ref 12.0–15.0)
Immature Granulocytes: 1 %
Lymphocytes Relative: 24 %
Lymphs Abs: 3.3 10*3/uL (ref 0.7–4.0)
MCH: 30.1 pg (ref 26.0–34.0)
MCHC: 33.2 g/dL (ref 30.0–36.0)
MCV: 90.6 fL (ref 80.0–100.0)
Monocytes Absolute: 0.9 10*3/uL (ref 0.1–1.0)
Monocytes Relative: 7 %
Neutro Abs: 9 10*3/uL — ABNORMAL HIGH (ref 1.7–7.7)
Neutrophils Relative %: 66 %
Platelets: 227 10*3/uL (ref 150–400)
RBC: 3.92 MIL/uL (ref 3.87–5.11)
RDW: 13.2 % (ref 11.5–15.5)
WBC: 13.5 10*3/uL — ABNORMAL HIGH (ref 4.0–10.5)
nRBC: 0 % (ref 0.0–0.2)

## 2023-04-23 LAB — LIPASE, BLOOD: Lipase: 38 U/L (ref 11–51)

## 2023-04-23 NOTE — ED Triage Notes (Signed)
 Pt to triage via w/c with no distress noted; reports since 7pm having mid CP radiating into upper abd accomp by nausea

## 2023-04-24 ENCOUNTER — Encounter
Admission: EM | Disposition: A | Discharge status: Home / Self Care | Payer: Self-pay | Source: Home / Self Care | Attending: Emergency Medicine

## 2023-04-24 ENCOUNTER — Observation Stay: Admitting: Anesthesiology

## 2023-04-24 ENCOUNTER — Ambulatory Visit: Admitting: Internal Medicine

## 2023-04-24 ENCOUNTER — Encounter: Payer: Self-pay | Admitting: General Surgery

## 2023-04-24 ENCOUNTER — Telehealth: Payer: Self-pay | Admitting: Surgery

## 2023-04-24 ENCOUNTER — Other Ambulatory Visit: Payer: Self-pay

## 2023-04-24 ENCOUNTER — Emergency Department

## 2023-04-24 ENCOUNTER — Observation Stay
Admission: EM | Admit: 2023-04-24 | Discharge: 2023-04-25 | Disposition: A | Discharge status: Home / Self Care | Attending: Emergency Medicine | Admitting: Emergency Medicine

## 2023-04-24 DIAGNOSIS — K8012 Calculus of gallbladder with acute and chronic cholecystitis without obstruction: Secondary | ICD-10-CM | POA: Diagnosis not present

## 2023-04-24 DIAGNOSIS — K810 Acute cholecystitis: Secondary | ICD-10-CM | POA: Diagnosis present

## 2023-04-24 DIAGNOSIS — K819 Cholecystitis, unspecified: Secondary | ICD-10-CM

## 2023-04-24 DIAGNOSIS — K8050 Calculus of bile duct without cholangitis or cholecystitis without obstruction: Principal | ICD-10-CM

## 2023-04-24 HISTORY — PX: UMBILICAL HERNIA REPAIR: SHX196

## 2023-04-24 LAB — URINALYSIS, ROUTINE W REFLEX MICROSCOPIC
Bilirubin Urine: NEGATIVE
Glucose, UA: NEGATIVE mg/dL
Hgb urine dipstick: NEGATIVE
Ketones, ur: 5 mg/dL — AB
Leukocytes,Ua: NEGATIVE
Nitrite: NEGATIVE
Protein, ur: NEGATIVE mg/dL
Specific Gravity, Urine: 1.016 (ref 1.005–1.030)
pH: 5 (ref 5.0–8.0)

## 2023-04-24 LAB — CBG MONITORING, ED
Glucose-Capillary: 164 mg/dL — ABNORMAL HIGH (ref 70–99)
Glucose-Capillary: 107 mg/dL — ABNORMAL HIGH (ref 70–99)
Glucose-Capillary: 100 mg/dL — ABNORMAL HIGH (ref 70–99)
Glucose-Capillary: 107 mg/dL — ABNORMAL HIGH (ref 70–99)
Glucose-Capillary: 153 mg/dL — ABNORMAL HIGH (ref 70–99)

## 2023-04-24 LAB — GLUCOSE, CAPILLARY
Glucose-Capillary: 229 mg/dL — ABNORMAL HIGH (ref 70–99)
Glucose-Capillary: 225 mg/dL — ABNORMAL HIGH (ref 70–99)

## 2023-04-24 SURGERY — CHOLECYSTECTOMY, ROBOT-ASSISTED, LAPAROSCOPIC
Anesthesia: General | Site: Abdomen

## 2023-04-24 MED ORDER — PIPERACILLIN-TAZOBACTAM 3.375 G IVPB
INTRAVENOUS | Status: AC
  Filled 2023-04-24: qty 50

## 2023-04-24 MED ORDER — FENTANYL CITRATE (PF) 100 MCG/2ML IJ SOLN
INTRAMUSCULAR | Status: AC
  Filled 2023-04-24: qty 2

## 2023-04-24 MED ORDER — ONDANSETRON HCL 4 MG/2ML IJ SOLN
4.0000 mg | Freq: Once | INTRAMUSCULAR | Status: AC
  Administered 2023-04-24: 4 mg via INTRAVENOUS
  Filled 2023-04-24: qty 2

## 2023-04-24 MED ORDER — BUPIVACAINE-EPINEPHRINE 0.25% -1:200000 IJ SOLN
INTRAMUSCULAR | Status: DC | PRN
  Administered 2023-04-24: 30 mL

## 2023-04-24 MED ORDER — SODIUM CHLORIDE 0.9 % IV SOLN
INTRAVENOUS | Status: AC
  Administered 2023-04-24: 75 mL/h via INTRAVENOUS
  Administered 2023-04-24: 1000 mL via INTRAVENOUS

## 2023-04-24 MED ORDER — HYDROCODONE-ACETAMINOPHEN 5-325 MG PO TABS
1.0000 | ORAL_TABLET | ORAL | Status: DC | PRN
  Administered 2023-04-24 – 2023-04-25 (×3): 1 via ORAL
  Filled 2023-04-24 (×3): qty 1

## 2023-04-24 MED ORDER — DEXAMETHASONE SODIUM PHOSPHATE 10 MG/ML IJ SOLN
INTRAMUSCULAR | Status: DC | PRN
  Administered 2023-04-24: 10 mg via INTRAVENOUS

## 2023-04-24 MED ORDER — FENTANYL CITRATE (PF) 100 MCG/2ML IJ SOLN
25.0000 ug | INTRAMUSCULAR | Status: DC | PRN
  Administered 2023-04-24 (×2): 25 ug via INTRAVENOUS
  Administered 2023-04-24: 50 ug via INTRAVENOUS

## 2023-04-24 MED ORDER — PIPERACILLIN-TAZOBACTAM 3.375 G IVPB
3.3750 g | Freq: Three times a day (TID) | INTRAVENOUS | Status: DC
  Administered 2023-04-24 – 2023-04-25 (×4): 3.375 g via INTRAVENOUS
  Filled 2023-04-24 (×3): qty 50

## 2023-04-24 MED ORDER — LACTATED RINGERS IV SOLN: INTRAVENOUS | Status: DC | PRN

## 2023-04-24 MED ORDER — SUGAMMADEX SODIUM 200 MG/2ML IV SOLN
INTRAVENOUS | Status: DC | PRN
  Administered 2023-04-24: 150 mg via INTRAVENOUS

## 2023-04-24 MED ORDER — INSULIN ASPART 100 UNIT/ML IJ SOLN
0.0000 [IU] | INTRAMUSCULAR | Status: DC
  Administered 2023-04-24 (×2): 5 [IU] via SUBCUTANEOUS
  Administered 2023-04-24 – 2023-04-25 (×2): 3 [IU] via SUBCUTANEOUS
  Filled 2023-04-24 (×4): qty 1

## 2023-04-24 MED ORDER — PROPOFOL 10 MG/ML IV BOLUS
INTRAVENOUS | Status: DC | PRN
  Administered 2023-04-24: 40 mg via INTRAVENOUS
  Administered 2023-04-24: 150 ug/kg/min via INTRAVENOUS

## 2023-04-24 MED ORDER — ACETAMINOPHEN 325 MG PO TABS
650.0000 mg | ORAL_TABLET | Freq: Four times a day (QID) | ORAL | Status: DC | PRN
  Administered 2023-04-24: 650 mg via ORAL

## 2023-04-24 MED ORDER — LIDOCAINE HCL (CARDIAC) PF 100 MG/5ML IV SOSY
PREFILLED_SYRINGE | INTRAVENOUS | Status: DC | PRN
  Administered 2023-04-24: 60 mg via INTRAVENOUS

## 2023-04-24 MED ORDER — ROCURONIUM BROMIDE 100 MG/10ML IV SOLN
INTRAVENOUS | Status: DC | PRN
  Administered 2023-04-24: 60 mg via INTRAVENOUS

## 2023-04-24 MED ORDER — SERTRALINE HCL 50 MG PO TABS
50.0000 mg | ORAL_TABLET | Freq: Every day | ORAL | Status: DC
  Filled 2023-04-24: qty 1

## 2023-04-24 MED ORDER — GLYCOPYRROLATE 0.2 MG/ML IJ SOLN
INTRAMUSCULAR | Status: DC | PRN
  Administered 2023-04-24: .2 mg via INTRAVENOUS

## 2023-04-24 MED ORDER — SODIUM CHLORIDE 0.9 % IV SOLN
1.0000 g | Freq: Once | INTRAVENOUS | Status: AC
  Administered 2023-04-24: 1 g via INTRAVENOUS
  Filled 2023-04-24: qty 10

## 2023-04-24 MED ORDER — LOSARTAN POTASSIUM 50 MG PO TABS
25.0000 mg | ORAL_TABLET | Freq: Every day | ORAL | Status: DC
  Administered 2023-04-24: 25 mg via ORAL
  Filled 2023-04-24: qty 1

## 2023-04-24 MED ORDER — MORPHINE SULFATE (PF) 4 MG/ML IV SOLN: 4.0000 mg | INTRAVENOUS | Status: DC | PRN

## 2023-04-24 MED ORDER — FAMOTIDINE 20 MG PO TABS: 20.0000 mg | ORAL_TABLET | Freq: Every day | ORAL | Status: DC | PRN

## 2023-04-24 MED ORDER — EPHEDRINE SULFATE-NACL 50-0.9 MG/10ML-% IV SOSY
PREFILLED_SYRINGE | INTRAVENOUS | Status: DC | PRN
  Administered 2023-04-24: 10 mg via INTRAVENOUS

## 2023-04-24 MED ORDER — ACETAMINOPHEN 325 MG PO TABS
ORAL_TABLET | ORAL | Status: AC
  Filled 2023-04-24: qty 2

## 2023-04-24 MED ORDER — FENTANYL CITRATE (PF) 100 MCG/2ML IJ SOLN
INTRAMUSCULAR | Status: DC | PRN
  Administered 2023-04-24 (×2): 100 ug via INTRAVENOUS

## 2023-04-24 MED ORDER — IBUPROFEN 400 MG PO TABS
400.0000 mg | ORAL_TABLET | Freq: Once | ORAL | Status: AC
  Administered 2023-04-24: 400 mg via ORAL
  Filled 2023-04-24: qty 1

## 2023-04-24 MED ORDER — 0.9 % SODIUM CHLORIDE (POUR BTL) OPTIME
TOPICAL | Status: DC | PRN
  Administered 2023-04-24: 500 mL

## 2023-04-24 MED ORDER — PROPOFOL 1000 MG/100ML IV EMUL
INTRAVENOUS | Status: AC
  Filled 2023-04-24: qty 100

## 2023-04-24 MED ORDER — LEVOTHYROXINE SODIUM 25 MCG PO TABS
50.0000 ug | ORAL_TABLET | Freq: Every day | ORAL | Status: DC
  Administered 2023-04-24 – 2023-04-25 (×2): 50 ug via ORAL
  Filled 2023-04-24: qty 1
  Filled 2023-04-24: qty 2

## 2023-04-24 MED ORDER — METRONIDAZOLE 500 MG/100ML IV SOLN
500.0000 mg | Freq: Once | INTRAVENOUS | Status: DC
  Filled 2023-04-24: qty 100

## 2023-04-24 MED ORDER — OXYCODONE HCL 5 MG PO TABS
5.0000 mg | ORAL_TABLET | Freq: Once | ORAL | Status: DC
  Filled 2023-04-24: qty 1

## 2023-04-24 MED ORDER — ONDANSETRON 4 MG PO TBDP: 4.0000 mg | ORAL_TABLET | Freq: Four times a day (QID) | ORAL | Status: DC | PRN

## 2023-04-24 MED ORDER — ACETAMINOPHEN 500 MG PO TABS
1000.0000 mg | ORAL_TABLET | Freq: Once | ORAL | Status: AC
  Administered 2023-04-24: 1000 mg via ORAL
  Filled 2023-04-24: qty 2

## 2023-04-24 MED ORDER — BUPIVACAINE-EPINEPHRINE (PF) 0.25% -1:200000 IJ SOLN
INTRAMUSCULAR | Status: AC
  Filled 2023-04-24: qty 30

## 2023-04-24 MED ORDER — ACETAMINOPHEN 650 MG RE SUPP: 650.0000 mg | Freq: Four times a day (QID) | RECTAL | Status: DC | PRN

## 2023-04-24 MED ORDER — DROPERIDOL 2.5 MG/ML IJ SOLN: 0.6250 mg | Freq: Once | INTRAMUSCULAR | Status: DC | PRN

## 2023-04-24 MED ORDER — METOPROLOL TARTRATE 5 MG/5ML IV SOLN
INTRAVENOUS | Status: DC | PRN
  Administered 2023-04-24: 2 mg via INTRAVENOUS
  Administered 2023-04-24: 3 mg via INTRAVENOUS

## 2023-04-24 MED ORDER — CHLORHEXIDINE GLUCONATE 0.12 % MT SOLN
OROMUCOSAL | Status: AC
  Filled 2023-04-24: qty 15

## 2023-04-24 MED ORDER — HYDRALAZINE HCL 20 MG/ML IJ SOLN
INTRAMUSCULAR | Status: DC | PRN
  Administered 2023-04-24: 10 mg via INTRAVENOUS

## 2023-04-24 MED ORDER — ONDANSETRON HCL 4 MG/2ML IJ SOLN: 4.0000 mg | Freq: Four times a day (QID) | INTRAMUSCULAR | Status: DC | PRN

## 2023-04-24 MED ORDER — ENOXAPARIN SODIUM 40 MG/0.4ML IJ SOSY
40.0000 mg | PREFILLED_SYRINGE | INTRAMUSCULAR | Status: DC
  Administered 2023-04-25: 40 mg via SUBCUTANEOUS
  Filled 2023-04-24: qty 0.4

## 2023-04-24 MED ORDER — INDOCYANINE GREEN 25 MG IV SOLR
1.2500 mg | Freq: Once | INTRAVENOUS | Status: AC
  Administered 2023-04-24: 1.25 mg via INTRAVENOUS

## 2023-04-24 SURGICAL SUPPLY — 48 items
BAG PRESSURE INF REUSE 1000 (BAG) IMPLANT
CANNULA REDUCER 12-8 DVNC XI (CANNULA) ×2 IMPLANT
CATH REDDICK CHOLANGI 4FR 50CM (CATHETERS) IMPLANT
CAUTERY HOOK MNPLR 1.6 DVNC XI (INSTRUMENTS) ×2 IMPLANT
CLIP LIGATING HEM O LOK PURPLE (MISCELLANEOUS) IMPLANT
CLIP LIGATING HEMO O LOK GREEN (MISCELLANEOUS) ×2 IMPLANT
DERMABOND ADVANCED .7 DNX12 (GAUZE/BANDAGES/DRESSINGS) ×2 IMPLANT
DRAPE ARM DVNC X/XI (DISPOSABLE) ×8 IMPLANT
DRAPE C-ARM XRAY 36X54 (DRAPES) IMPLANT
DRAPE COLUMN DVNC XI (DISPOSABLE) ×2 IMPLANT
ELECT REM PT RETURN 9FT ADLT (ELECTROSURGICAL) ×2 IMPLANT
ELECTRODE REM PT RTRN 9FT ADLT (ELECTROSURGICAL) ×2 IMPLANT
FORCEPS BPLR 8 MD DVNC XI (FORCEP) ×2 IMPLANT
FORCEPS BPLR FENES DVNC XI (FORCEP) ×2 IMPLANT
FORCEPS PROGRASP DVNC XI (FORCEP) ×2 IMPLANT
GLOVE BIO SURGEON STRL SZ 6.5 (GLOVE) ×4 IMPLANT
GLOVE BIOGEL PI IND STRL 6.5 (GLOVE) ×4 IMPLANT
GOWN STRL REUS W/ TWL LRG LVL3 (GOWN DISPOSABLE) ×6 IMPLANT
GRASPER SUT TROCAR 14GX15 (MISCELLANEOUS) ×2 IMPLANT
IRRIGATOR SUCT 8 DISP DVNC XI (IRRIGATION / IRRIGATOR) IMPLANT
IV CATH ANGIO 12GX3 LT BLUE (NEEDLE) IMPLANT
IV NS 1000ML BAXH (IV SOLUTION) IMPLANT
KIT PINK PAD W/HEAD ARE REST (MISCELLANEOUS) ×2 IMPLANT
KIT PINK PAD W/HEAD ARM REST (MISCELLANEOUS) ×2 IMPLANT
LABEL OR SOLS (LABEL) ×2 IMPLANT
MANIFOLD NEPTUNE II (INSTRUMENTS) ×2 IMPLANT
NDL HYPO 22X1.5 SAFETY MO (MISCELLANEOUS) ×2 IMPLANT
NDL INSUFFLATION 14GA 120MM (NEEDLE) ×2 IMPLANT
NEEDLE HYPO 22X1.5 SAFETY MO (MISCELLANEOUS) ×2 IMPLANT
NEEDLE INSUFFLATION 14GA 120MM (NEEDLE) ×2 IMPLANT
NS IRRIG 500ML POUR BTL (IV SOLUTION) ×2 IMPLANT
OBTURATOR OPTICAL STND 8 DVNC (TROCAR) ×2 IMPLANT
OBTURATOR OPTICALSTD 8 DVNC (TROCAR) ×2 IMPLANT
PACK LAP CHOLECYSTECTOMY (MISCELLANEOUS) ×2 IMPLANT
SEAL UNIV 5-12 XI (MISCELLANEOUS) ×8 IMPLANT
SET TUBE SMOKE EVAC HIGH FLOW (TUBING) ×2 IMPLANT
SOL ELECTROSURG ANTI STICK (MISCELLANEOUS) ×2 IMPLANT
SOLUTION ELECTROSURG ANTI STCK (MISCELLANEOUS) ×2 IMPLANT
SPIKE FLUID TRANSFER (MISCELLANEOUS) ×4 IMPLANT
SPONGE T-LAP 4X18 ~~LOC~~+RFID (SPONGE) IMPLANT
SUT MNCRL 4-0 27 PS-2 XMFL (SUTURE) ×2 IMPLANT
SUT VIC AB 2-0 SH 27XBRD (SUTURE) IMPLANT
SUT VICRYL 0 UR6 27IN ABS (SUTURE) ×2 IMPLANT
SUTURE MNCRL 4-0 27XMF (SUTURE) ×2 IMPLANT
SYS BAG RETRIEVAL 10MM (BASKET) ×2 IMPLANT
SYSTEM BAG RETRIEVAL 10MM (BASKET) ×2 IMPLANT
TRAP FLUID SMOKE EVACUATOR (MISCELLANEOUS) ×2 IMPLANT
WATER STERILE IRR 500ML POUR (IV SOLUTION) ×2 IMPLANT

## 2023-04-24 NOTE — ED Notes (Signed)
 Pt taken back to room by this EDT via  W/C. Pt unsteady needing 1 person assist when transferring to bed from wheelchair. Pt hooked up to cardiac monitor. Bed in lowest position with side rails up. Pt provided with blanket and call light within reach. No further needs at this time.

## 2023-04-24 NOTE — ED Notes (Signed)
 Patient c/o dizziness when standing up to use the bathroom. Patient requested BGL check.

## 2023-04-24 NOTE — Anesthesia Procedure Notes (Signed)
 Procedure Name: Intubation Date/Time: 04/24/2023 4:25 PM  Performed by: Jean Michaelis., CRNAPre-anesthesia Checklist: Patient identified, Patient being monitored, Timeout performed, Emergency Drugs available and Suction available Patient Re-evaluated:Patient Re-evaluated prior to induction Oxygen Delivery Method: Circle system utilized Preoxygenation: Pre-oxygenation with 100% oxygen Induction Type: IV induction Ventilation: Mask ventilation without difficulty Laryngoscope Size: 3 and McGrath Grade View: Grade I Tube type: Oral Tube size: 6.5 mm Number of attempts: 1 Airway Equipment and Method: Stylet Placement Confirmation: ETT inserted through vocal cords under direct vision, positive ETCO2 and breath sounds checked- equal and bilateral Secured at: 20 cm Tube secured with: Tape Dental Injury: Teeth and Oropharynx as per pre-operative assessment

## 2023-04-24 NOTE — Anesthesia Preprocedure Evaluation (Addendum)
 Anesthesia Evaluation  Patient identified by MRN, date of birth, ID band Patient awake    Reviewed: Allergy & Precautions, H&P , NPO status , Patient's Chart, lab work & pertinent test results  History of Anesthesia Complications (+) PONV and history of anesthetic complications  Airway Mallampati: II  TM Distance: <3 FB Neck ROM: full    Dental no notable dental hx. (+) Teeth Intact   Pulmonary neg shortness of breath, sleep apnea , neg COPD, Recent URI , Residual Cough, Patient abstained from smoking.Not current smoker   Pulmonary exam normal breath sounds clear to auscultation       Cardiovascular Exercise Tolerance: Good METShypertension, (-) angina (-) CAD and (-) Past MI Normal cardiovascular exam(-) dysrhythmias  Rhythm:Regular Rate:Normal - Systolic murmurs    Neuro/Psych  PSYCHIATRIC DISORDERS  Depression    negative neurological ROS     GI/Hepatic Neg liver ROS,GERD  Medicated,,  Endo/Other  diabetes, Well Controlled, Oral Hypoglycemic AgentsHypothyroidism    Renal/GU negative Renal ROS  negative genitourinary   Musculoskeletal  (+) Arthritis ,    Abdominal Normal abdominal exam  (+)   Peds  Hematology negative hematology ROS (+)   Anesthesia Other Findings Past Medical History: No date: AMD (acid maltase deficiency) (HCC) No date: Complication of anesthesia     Comment:  slow to wake up in past No date: Depression     Comment:  controlled No date: Diabetes mellitus without complication (HCC)     Comment:  controlled No date: Hyperlipidemia No date: Hypertension No date: Hypothyroidism No date: Neuropathy No date: Osteoarthritis     Comment:  knee, ankles, shoulders, back, neck, hands/fingers,               wrists;  No date: Rectocele No date: Thyroid disease No date: 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[facility])  Past Surgical History: 1975: AUGMENTATION MAMMAPLASTY 2017: AUGMENTATION MAMMAPLASTY      Comment:  2nd augmentation and lift No date: BREAST SURGERY     Comment:  impants replaced with lift 08/26/2014: CARPAL TUNNEL RELEASE; Right     Comment:  Procedure: CARPAL TUNNEL RELEASE;  Surgeon: Marlynn Singer, MD;  Location: [facility] ORS;  Service: Orthopedics;                Laterality: Right; 2005: COLONOSCOPY     Comment:  Dr [name] 06/24/2014: COLONOSCOPY; N/A     Comment:  Procedure: COLONOSCOPY;  Surgeon: Marshall Skeeter, MD;              Location: [facility] ENDOSCOPY;  Service: Endoscopy;                Laterality: N/A; 1992: PARATHYROIDECTOMY 1975: PLACEMENT OF BREAST IMPLANTS No date: TONSILECTOMY, ADENOIDECTOMY, BILATERAL MYRINGOTOMY AND TUBES  BMI    Body Mass Index: 27.15 kg/m      Reproductive/Obstetrics negative OB ROS                             Anesthesia Physical Anesthesia Plan  ASA: 2  Anesthesia Plan: General   Post-op Pain Management: Minimal or no pain anticipated   Induction: Intravenous  PONV Risk Score and Plan: 3 and Propofol infusion and TIVA  Airway Management Planned: Oral ETT  Additional Equipment: None  Intra-op Plan:   Post-operative Plan: Extubation in OR  Informed Consent: I have reviewed the patients History and Physical, chart,  labs and discussed the procedure including the risks, benefits and alternatives for the proposed anesthesia with the patient or authorized representative who has indicated his/her understanding and acceptance.     Dental Advisory Given  Plan Discussed with: CRNA and Surgeon  Anesthesia Plan Comments: (Discussed risks of anesthesia with patient, including possibility of difficulty with spontaneous ventilation under anesthesia necessitating airway intervention, PONV, and rare risks such as cardiac or respiratory or neurological events, and allergic reactions. Discussed the role of CRNA in patient's perioperative care. Patient understands.)        Anesthesia Quick  Evaluation

## 2023-04-24 NOTE — ED Notes (Signed)
 Pt advised that her pain has subsided to a 0/10 at this time.

## 2023-04-24 NOTE — Telephone Encounter (Signed)
 Per Dr. Mauri Sous will cancel surgery for 05/10/23.  Patient ended up in the ER with cholecystitis.   Dr. Cintron will be taking her to the OR today.

## 2023-04-24 NOTE — H&P (Signed)
 SURGICAL CONSULTATION NOTE   HISTORY OF PRESENT ILLNESS (HPI):  83 y.o. female presented to [facility] ED for evaluation of abdominal pain and chest pain that started last night. Patient reports she has been having right upper quadrant pain that radiates to the epigastric, chest and lower abdomen.  Pain last night was severe and constant.  Patient can identify any aggravating factor last night.  Alleviating factor was pain medication in the ED.  At the ED she was found with stable vital signs.  There was right upper quadrant tenderness on palpation.  Labs shows mild leukocytosis of 13.5 with hemoglobin of 11.8.  The CMP shows normal potassium, normal AST/ALT, normal total bilirubin of 0.6.  She had an ultrasound of the abdomen that shows gallbladder wall thickening with cholelithiasis and pericholecystic edema.  There was positive Murphy sign.  I personally evaluated the images.  Surgery is consulted by Dr. Modesto Charon in this context for evaluation and management of acute cholecystitis.  PAST MEDICAL HISTORY (PMH):  Past Medical History:  Diagnosis Date   Acute bacterial sinusitis 11/18/2021   Allergic rhinitis    AMD (acid maltase deficiency) (HCC)    Anterior knee pain 02/10/2022   Arthralgia of multiple joints 11/12/2014   Biliary dyskinesia 2025   Bursitis of hip 02/10/2022   Carpal tunnel syndrome 06/30/2014   Cervical arthritis 07/06/2016   Cholelithiasis 2025   Complication of anesthesia    slow to wake up in past   Depression    controlled   Diabetes mellitus without complication (HCC)    controlled   GERD (gastroesophageal reflux disease)    Heart palpitations 07/06/2020   Hyperlipidemia    Hypertension    Hypothyroidism    Impingement syndrome of left shoulder 02/25/2021   Iron deficiency 01/15/2023   Lipoma of foot 02/10/2022   Neuropathy    Osteoarthritis    knee, ankles, shoulders, back, neck, hands/fingers, wrists;    Osteoarthritis of knee 10/10/2017   Premature  ventricular contractions 08/09/2020   Pure hypercholesterolemia 07/06/2020   Rectocele    Sleep apnea    Thyroid disease    Vaginal atrophy 08/16/2018   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HCC)      PAST SURGICAL HISTORY (PSH):  Past Surgical History:  Procedure Laterality Date   AUGMENTATION MAMMAPLASTY  1975   AUGMENTATION MAMMAPLASTY  2017   2nd augmentation and lift   BIOPSY  07/11/2022   Procedure: BIOPSY;  Surgeon: Midge Minium, MD;  Location: [facility] ENDOSCOPY;  Service: Endoscopy;;   BREAST SURGERY     impants replaced with lift   CARPAL TUNNEL RELEASE Right 08/26/2014   Procedure: CARPAL TUNNEL RELEASE;  Surgeon: Deeann Saint, MD;  Location: [facility] ORS;  Service: Orthopedics;  Laterality: Right;   COLONOSCOPY  2005   Dr Lemar Livings   COLONOSCOPY N/A 06/24/2014   Procedure: COLONOSCOPY;  Surgeon: Earline Mayotte, MD;  Location: [facility] ENDOSCOPY;  Service: Endoscopy;  Laterality: N/A;   COLONOSCOPY N/A 04/11/2023   Procedure: COLONOSCOPY;  Surgeon: Wyline Mood, MD;  Location: [facility] ENDOSCOPY;  Service: Gastroenterology;  Laterality: N/A;  please schedule in am, she is diabetic   ESOPHAGOGASTRODUODENOSCOPY (EGD) WITH PROPOFOL N/A 07/11/2022   Procedure: ESOPHAGOGASTRODUODENOSCOPY (EGD) WITH PROPOFOL;  Surgeon: Midge Minium, MD;  Location: [facility] ENDOSCOPY;  Service: Endoscopy;  Laterality: N/A;   PARATHYROIDECTOMY  1992   PLACEMENT OF BREAST IMPLANTS  1975   POLYPECTOMY  04/11/2023   Procedure: POLYPECTOMY;  Surgeon: Wyline Mood, MD;  Location[facility]COPY;  Service: Gastroenterology;;  TONSILECTOMY, ADENOIDECTOMY, BILATERAL MYRINGOTOMY AND TUBES       MEDICATIONS:  Prior to Admission medications   Medication Sig Start Date End Date Taking? Authorizing Provider  Accu-Chek FastClix Lancets MISC CHECK SUGAR ONCE DAILY. DX: E11.9 09/20/18   Bosie Clos, MD  ACCU-CHEK SMARTVIEW test strip USE TO CHECK BLOOD SUGAR ONCE A DAY 11/26/20   Bosie Clos, MD  ascorbic acid (VITAMIN C) 500  MG tablet Take 500 mg by mouth daily.    [provider]  Blood Glucose Monitoring Suppl (ACCU-CHEK GUIDE ME) w/Device KIT  05/27/22   [provider]  Cholecalciferol (VITAMIN D) 50 MCG (2000 UT) tablet Take 2,000 Units by mouth daily.    [provider]  diazepam (VALIUM) 5 MG tablet Take 2.5 mg by mouth daily as needed for anxiety. 07/19/22   [provider]  estradiol (ESTRACE) 0.1 MG/GM vaginal cream 1 gram intravaginally at bedtime daily for 2-3 weeks, then decrease to 3 times per week Patient taking differently: Place 1 Applicatorful vaginally 3 (three) times a week. 10/30/22   Alfredo Martinez, MD  famotidine (PEPCID) 20 MG tablet Take 20 mg by mouth daily as needed for heartburn. 05/04/22   [provider]  hydroquinone 4 % cream Apply topically 2 (two) times daily. 12/08/20   Willeen Niece, MD  levothyroxine (SYNTHROID) 50 MCG tablet Take 1 tablet (50 mcg total) by mouth daily before breakfast. 12/05/21   Jacky Kindle, FNP  losartan (COZAAR) 25 MG tablet Take 1 tablet by mouth at bedtime. 05/10/22 05/10/23  [provider]  Magnesium 200 MG TABS Take 200 mg by mouth 4 (four) times a week.    [provider]  metFORMIN (GLUCOPHAGE-XR) 500 MG 24 hr tablet TAKE 1 TABLET TWICE A DAY Patient taking differently: Take 2,000 mg by mouth daily with supper. 05/26/21   Bosie Clos, MD  methocarbamol (ROBAXIN) 500 MG tablet Take 500 mg by mouth as needed for muscle spasms.    [provider]  mometasone (ELOCON) 0.1 % cream Apply 1 application topically See admin instructions. Use 1 - 2 times daily until clear, then use as a spot treatment when needed Patient taking differently: Apply 1 Application topically daily as needed (eczema in ears). 02/15/21   Willeen Niece, MD  Multiple Vitamins-Minerals (PRESERVISION AREDS PO) Take 1 tablet by mouth daily.    [provider]  rosuvastatin (CRESTOR) 20 MG tablet TAKE 1 TABLET  DAILY 03/30/21   Bosie Clos, MD  sertraline (ZOLOFT) 100 MG tablet TAKE 1 TABLET DAILY Patient taking differently: Take 50 mg by mouth daily. 04/04/21   Bosie Clos, MD     ALLERGIES:  Allergies  Allergen Reactions   Clarithromycin Other (See Comments)    Dizziness   Neosporin [Neomycin-Bacitracin Zn-Polymyx] Swelling   Sulfa Antibiotics Other (See Comments)    Hallucinations as a child   Thimerosal (Thiomersal) Swelling    redness   Zostavax  [Zoster Vaccine Live] Rash    Do Not Administer Zostavax to Pt!     SOCIAL HISTORY:  Social History   Socioeconomic History   Marital status: Widowed    Spouse name: Not on file   Number of children: 3   Years of education: Not on file   Highest education level: Bachelor's degree (e.g., BA, AB, BS)  Occupational History   Occupation: retired  Tobacco Use   Smoking status: Never   Smokeless tobacco: Never  Vaping Use   Vaping status:  Never Used  Substance and Sexual Activity   Alcohol use: Yes    Alcohol/week: 1.0 standard drink of alcohol    Types: 1 Glasses of wine per week   Drug use: No   Sexual activity: Not on file  Other Topics Concern   Not on file  Social History Narrative   Not on file   Social Drivers of Health   Financial Resource Strain: Low Risk  (07/19/2022)   Received from [facility]   Overall Financial Resource Strain (CARDIA)    Difficulty of Paying Living Expenses: Not hard at all  Food Insecurity: No Food Insecurity (07/19/2022)   Received from [facility]   Hunger Vital Sign    Worried About Running Out of Food in the Last Year: Never true    Ran Out of Food in the Last Year: Never true  Transportation Needs: No Transportation Needs (07/19/2022)   Received from [facility] - Transportation    In the past 12 months, has lack of transportation kept you from medical appointments or from getting medications?: No    Lack of  Transportation (Non-Medical): No  Physical Activity: Insufficiently Active (07/19/2022)   Received from [facility]   Exercise Vital Sign    Days of Exercise per Week: 3 days    Minutes of Exercise per Session: 30 min  Stress: No Stress Concern Present (02/22/2021)   [employer] of Occupational Health - Occupational Stress Questionnaire    Feeling of Stress : Not at all  Social Connections: Socially Isolated (02/22/2021)   Social Connection and Isolation Panel [NHANES]    Frequency of Communication with Friends and Family: More than three times a week    Frequency of Social Gatherings with Friends and Family: More than three times a week    Attends Religious Services: Never    Database administrator or Organizations: No    Attends Banker Meetings: Never    Marital Status: Widowed  Intimate Partner Violence: Not At Risk (02/22/2021)   Humiliation, Afraid, Rape, and Kick questionnaire    Fear of Current or Ex-Partner: No    Emotionally Abused: No    Physically Abused: No    Sexually Abused: No     FAMILY HISTORY:  Family History  Problem Relation Age of Onset   Cancer Father        prostate   Prostate cancer Father    Hypertension Mother    Heart attack Mother    Heart attack Maternal Grandfather    Diabetes Paternal Uncle    Congestive Heart Failure Maternal Grandmother    CVA Paternal Grandmother    Prostate cancer Paternal Grandfather    Breast cancer Maternal Aunt      REVIEW OF SYSTEMS:  Constitutional: denies weight loss, fever, chills, or sweats  Eyes: denies any other vision changes, history of eye injury  ENT: denies sore throat, hearing problems  Respiratory: denies shortness of breath, wheezing  Cardiovascular: denies chest pain, palpitations  Gastrointestinal: positive abdominal pain, nausea Genitourinary: denies burning with urination or urinary frequency Musculoskeletal: denies any other joint pains or cramps  Skin:  denies any other rashes or skin discolorations  Neurological: denies any other headache, dizziness, weakness  Psychiatric: denies any other depression, anxiety   All other review of systems were negative   VITAL SIGNS:  Temp:  [97.8 F (36.6 C)-98.5 F (36.9 C)] 97.8 F (36.6 C) (04/15 0447) Pulse  Rate:  [64-73] 73 (04/15 0130) Resp:  [16-17] 16 (04/15 0130) BP: (136-173)/(65-73) 136/73 (04/15 0130) SpO2:  [97 %] 97 % (04/15 0130) Weight:  [64 kg] 64 kg (04/14 2212)     Height: 5' (152.4 cm) Weight: 64 kg BMI (Calculated): 27.54   INTAKE/OUTPUT:  This shift: Total I/O In: 100 [IV Piggyback:100] Out: -   Last 2 shifts: @IOLAST2SHIFTS @   PHYSICAL EXAM:  Constitutional:  -- Normal body habitus  -- Awake, alert, and oriented x3  Eyes:  -- Pupils equally round and reactive to light  -- No scleral icterus  Ear, nose, and throat:  -- No jugular venous distension  Pulmonary:  -- No crackles  -- Equal breath sounds bilaterally -- Breathing non-labored at rest Cardiovascular:  -- S1, S2 present  -- No pericardial rubs Gastrointestinal:  -- Abdomen soft, tender to palpation in the right upper quadrant, non-distended, no guarding or rebound tenderness -- No abdominal masses appreciated, pulsatile or otherwise  Musculoskeletal and Integumentary:  -- Wounds: None appreciated -- Extremities: B/L UE and LE FROM, hands and feet warm, no edema  Neurologic:  -- Motor function: intact and symmetric -- Sensation: intact and symmetric   Labs:     Latest Ref Rng & Units 04/23/2023   10:21 PM 02/12/2023    2:32 PM 09/29/2021    7:28 AM  CBC  WBC 4.0 - 10.5 K/uL 13.5  9.1  9.1   Hemoglobin 12.0 - 15.0 g/dL 40.9  81.1  91.4   Hematocrit 36.0 - 46.0 % 35.5  38.6  40.4   Platelets 150 - 400 K/uL 227  255  218       Latest Ref Rng & Units 04/23/2023   10:21 PM 02/12/2023    2:32 PM 09/29/2021    7:28 AM  CMP  Glucose 70 - 99 mg/dL 782  956  213   BUN 8 - 23 mg/dL 17  20  16     Creatinine 0.44 - 1.00 mg/dL 0.86  5.78  4.69   Sodium 135 - 145 mmol/L 133  136  140   Potassium 3.5 - 5.1 mmol/L 3.5  4.0  4.3   Chloride 98 - 111 mmol/L 98  98  101   CO2 22 - 32 mmol/L 24  22  22    Calcium 8.9 - 10.3 mg/dL 9.4  62.9  9.7   Total Protein 6.5 - 8.1 g/dL 6.9  6.5  6.9   Total Bilirubin 0.0 - 1.2 mg/dL 0.6  0.4  0.4   Alkaline Phos 38 - 126 U/L 33  42  45   AST 15 - 41 U/L 19  19  28    ALT 0 - 44 U/L 15  17  19      Imaging studies:  EXAM: ULTRASOUND ABDOMEN LIMITED RIGHT UPPER QUADRANT   COMPARISON:  02/28/2023   FINDINGS: Gallbladder:   The gallbladder is distended. There is layering sludge and small stones identified within the gallbladder lumen. The gallbladder wall is thickened, new since prior examination, measuring up to 4 mm in thickness. No pericholecystic fluid is identified. The sonographic Eulah Pont sign is reportedly negative.   Common bile duct:   Diameter: 5 mm in proximal diameter   Liver:   No focal lesion identified. Within normal limits in parenchymal echogenicity. Portal vein is patent on color Doppler imaging with normal direction of blood flow towards the liver.   Other: None.   IMPRESSION: 1. Cholelithiasis and gallbladder sludge with new gallbladder wall  thickening. These findings may reflect early changes of acute cholecystitis correlation with liver enzymes is recommended. If indicated, hepatobiliary scintigraphy may be helpful to assess patency of the cystic duct.     Electronically Signed   By: Worthy Heads M.D.   On: 04/24/2023 03:04  Assessment/Plan:  83 y.o. female with acute cholecystitis, complicated by pertinent comorbidities including hypertension, diabetes, hyperlipidemia, hypothyroidism.  Patient with history, physical exam and images consistent with acute cholecystitis. Patient oriented about diagnosis and surgical management as treatment.   Discussed the risk of surgery including post-op infxn, seroma,  biloma, chronic pain, poor-delayed wound healing, retained gallstone, conversion to open procedure, post-op SBO or ileus, and need for additional procedures to address said risks.  The risks of general anesthetic including MI, CVA, sudden death or even reaction to anesthetic medications also discussed. Alternatives include continued observation.  Benefits include possible symptom relief, prevention of complications including acute cholecystitis, pancreatitis.  Lucila Rye, MD

## 2023-04-24 NOTE — Telephone Encounter (Signed)
 Updated information regarding rescheduled surgery at patient's request.    Patient has been advised of Pre-Admission date/time, and Surgery date at [facility].  Surgery Date: 05/10/23 Preadmission Testing Date: Already done, Preadmissions for possible chart review.   Patient informed of the scheduling process and surgery information is given to her.    Patient has been made aware to call [phone], between 1-3:00pm the day before surgery, to find out what time to arrive for surgery.

## 2023-04-24 NOTE — Op Note (Signed)
 Preoperative diagnosis: Acute cholecystitis  Postoperative diagnosis: Same  Procedure: Robotic Assisted Laparoscopic Cholecystectomy.   Anesthesia: GETA   Surgeon: Dr. Hazle Quant  Wound Classification: Clean Contaminated  Indications: Patient is a 83 y.o. female developed right upper quadrant pain, nausea, leukocytosis and on workup was found to have cholelithiasis with a normal common duct with cholecystitis. Robotic Assisted Laparoscopic cholecystectomy was elected.  Findings: Pericholecystic edema Critical view of safety achieved Cystic duct and artery identified, ligated and divided Adequate hemostasis  Description of procedure: The patient was placed on the operating table in the supine position. General anesthesia was induced. A time-out was completed verifying correct patient, procedure, site, positioning, and implant(s) and/or special equipment prior to beginning this procedure. An orogastric tube was placed. The abdomen was prepped and draped in the usual sterile fashion.  An incision was made in a natural skin line below the umbilicus.  The fascia was elevated and the Veress needle inserted. Proper position was confirmed by aspiration and saline meniscus test.  The abdomen was insufflated with carbon dioxide to a pressure of 15 mmHg. The patient tolerated insufflation well. A 8-mm trocar was then inserted in optiview fashion.  The laparoscope was inserted and the abdomen inspected. No injuries from initial trocar placement were noted. Additional trocars were then inserted in the following locations: an 8-mm trocar in the left lateral abdomen, and another two 8-mm trocars to the right side of the abdomen 5 cm appart. The umbilical trocar was changed to a 12 mm trocar all under direct visualization. The abdomen was inspected and no abnormalities were found. The table was placed in the reverse Trendelenburg position with the right side up. The robotic arms were docked and target  anatomy identified. Instrument inserted under direct visualization.  Filmy adhesions between the gallbladder and omentum, duodenum and transverse colon were lysed with electrocautery. The dome of the gallbladder was grasped with a prograsp and retracted over the dome of the liver. The infundibulum was also grasped with an atraumatic grasper and retracted toward the right lower quadrant. This maneuver exposed Calot's triangle. The peritoneum overlying the gallbladder infundibulum was then incised and the cystic duct and cystic artery identified and circumferentially dissected. Critical view of safety reviewed before ligating any structure. Firefly images taken to visualize biliary ducts. The cystic duct and cystic artery were then doubly clipped and divided close to the gallbladder.  The gallbladder was then dissected from its peritoneal attachments by electrocautery. Hemostasis was checked and the gallbladder and contained stones were removed using an endoscopic retrieval bag. The gallbladder was passed off the table as a specimen. There was no evidence of bleeding from the gallbladder fossa or cystic artery or leakage of the bile from the cystic duct stump. Secondary trocars were removed under direct vision. No bleeding was noted. The robotic arms were undoked. The scope was withdrawn and the umbilical trocar removed. The abdomen was allowed to collapse. The fascia of the 12mm trocar sites was closed with figure-of-eight 0 vicryl sutures. The skin was closed with subcuticular sutures of 4-0 monocryl and topical skin adhesive. The orogastric tube was removed.  The patient tolerated the procedure well and was taken to the postanesthesia care unit in stable condition.   Specimen: Gallbladder  Complications: None  EBL: 5 mL

## 2023-04-24 NOTE — ED Provider Notes (Addendum)
 [facility] Provider Note    Event Date/Time   First MD Initiated Contact with Patient 04/24/23 0107     (approximate)   History   Chest Pain   HPI  Nicole Young is a 83 y.o. female   Past medical history of known gallstones with a cholecystectomy scheduled for outpatient in 2 weeks time, here with postprandial right upper quadrant pain that was quite severe associated with mild nausea.  It started tonight around 7 PM after dinner.  It felt like her biliary colic she has been experiencing the last few months.  The pain has mostly subsided now only 3 out of 10 with mild nausea.  She has had no GI bleeding, no GU symptoms.  The pain felt like it radiated from her right upper quadrant across the upper abdomen into her chest into her back tonight.  She has no shortness of breath.  She has a resolving cough.  No fevers or chills.  Independent Historian contributed to assessment above: Grandchildren at bedside corroborate information given above  External Medical Documents Reviewed: GI appointment in February and an ultrasound at that time that showed gallbladder stones wo infx      Physical Exam   Triage Vital Signs: ED Triage Vitals  Encounter Vitals Group     BP 04/23/23 2219 (!) 173/65     Systolic BP Percentile --      Diastolic BP Percentile --      Pulse Rate 04/23/23 2219 64     Resp 04/23/23 2219 17     Temp 04/23/23 2219 98.5 F (36.9 C)     Temp Source 04/23/23 2219 Oral     SpO2 04/23/23 2219 97 %     Weight 04/23/23 2212 141 lb (64 kg)     Height 04/23/23 2212 5' (1.524 m)     Head Circumference --      Peak Flow --      Pain Score 04/23/23 2212 4     Pain Loc --      Pain Education --      Exclude from Growth Chart --     Most recent vital signs: Vitals:   04/23/23 2219 04/24/23 0130  BP: (!) 173/65 136/73  Pulse: 64 73  Resp: 17 16  Temp: 98.5 F (36.9 C)   SpO2: 97% 97%    General: Awake, no distress.   CV:  Good peripheral perfusion.  Resp:  Normal effort.  Abd:  No distention.  Other:  Right upper quadrant tenderness with positive Murphy sign, the rest of the quadrants are nontender.  She appears awake alert pleasant slightly hypertensive otherwise vital signs are normal.  Lung sounds clear.   ED Results / Procedures / Treatments   Labs (all labs ordered are listed, but only abnormal results are displayed) Labs Reviewed  COMPREHENSIVE METABOLIC PANEL WITH GFR - Abnormal; Notable for the following components:      Result Value   Sodium 133 (*)    Glucose, Bld 153 (*)    Alkaline Phosphatase 33 (*)    All other components within normal limits  CBC WITH DIFFERENTIAL/PLATELET - Abnormal; Notable for the following components:   WBC 13.5 (*)    Hemoglobin 11.8 (*)    HCT 35.5 (*)    Neutro Abs 9.0 (*)    Abs Immature Granulocytes 0.11 (*)    All other components within normal limits  URINALYSIS, ROUTINE W REFLEX MICROSCOPIC - Abnormal; Notable for the  following components:   Color, Urine YELLOW (*)    APPearance HAZY (*)    Ketones, ur 5 (*)    All other components within normal limits  LIPASE, BLOOD  TROPONIN I (HIGH SENSITIVITY)  TROPONIN I (HIGH SENSITIVITY)     I ordered and reviewed the above labs they are notable for white blood cell count is elevated 13.5 otherwise LFTs lipase normal.  EKG  ED ECG REPORT I, Buell Carmin, the attending physician, personally viewed and interpreted this ECG.   Date: 04/24/2023  EKG Time: 2224  Rate: 62  Rhythm: sinus  Axis: nl  Intervals:short pr  ST&T Change: no stemi    RADIOLOGY I independently reviewed and interpreted chest x-ray and I see no obvious cavity or pneumothorax I also reviewed radiologist's formal read.   PROCEDURES:  Critical Care performed: No  Procedures   MEDICATIONS ORDERED IN ED: Medications  oxyCODONE (Oxy IR/ROXICODONE) immediate release tablet 5 mg (0 mg Oral Hold 04/24/23 0136)  cefTRIAXone  (ROCEPHIN) 1 g in sodium chloride 0.9 % 100 mL IVPB (has no administration in time range)  metroNIDAZOLE (FLAGYL) IVPB 500 mg (has no administration in time range)  acetaminophen (TYLENOL) tablet 1,000 mg (1,000 mg Oral Given 04/24/23 0133)  ibuprofen (ADVIL) tablet 400 mg (400 mg Oral Given 04/24/23 0133)    IMPRESSION / MDM / ASSESSMENT AND PLAN / ED COURSE  I reviewed the triage vital signs and the nursing notes.                                Patient's presentation is most consistent with acute presentation with potential threat to life or bodily function.  Differential diagnosis includes, but is not limited to, biliary colic, gallstones, cholecystitis, pancreatitis, gastritis/ulcer, ACS considered but less likely   The patient is on the cardiac monitor to evaluate for evidence of arrhythmia and/or significant heart rate changes.  MDM:    She has known gallstones and episodes of biliary colic with a similar episode tonight with more severity that has now mostly resolved.  She has tenderness and positive [name] sign will check right upper quadrant ultrasound for any ultrasonographic evidence of cholecystitis.  Will treat with oral pain medications antiemetics today.  She has some associated chest pain I think is radiation of pain from gallbladder rather than ACS, this is especially lower on clinical suspicion given normal-appearing EKG without ischemic changes and normal troponin.  Disposition pending the results of her ultrasound and response to treatment as above.  If signs of infection or poorly controlled pain we will consult with surgery to see if she can get the gallbladder out during this hospital stay. If no infection noted on ultrasound and pain is well-controlled plan will be for discharge follow-up with surgery for outpatient management as scheduled.     Gallstones and wall thickening on ultrasound.  Remains tender.  With clinical exam findings, ultrasound findings, known  gallstones, white blood cell count elevation I think she is having cholecystitis so I gave her antibiotics.  I consulted with Dr. Charmel Cooter of general surgery for admission.  She is hemodynamically stable and comfortable at this time.     FINAL CLINICAL IMPRESSION(S) / ED DIAGNOSES   Final diagnoses:  Biliary colic  Cholecystitis     Rx / DC Orders   ED Discharge Orders     None        Note:  This document was prepared  using Conservation officer, historic buildings and may include unintentional dictation errors.    Buell Carmin, MD 04/24/23 Valentino Gasser    Buell Carmin, MD 04/24/23 [phone]

## 2023-04-24 NOTE — Transfer of Care (Signed)
 Immediate Anesthesia Transfer of Care Note  Patient: Aadya Kindler [id number]  Procedure(s) Performed: CHOLECYSTECTOMY, ROBOT-ASSISTED, LAPAROSCOPIC with UMBILICAL HERNIA REPAIR (Abdomen) REPAIR, HERNIA, UMBILICAL, ADULT (Abdomen)  Patient Location: PACU  Anesthesia Type:General  Level of Consciousness: awake, drowsy, and patient cooperative  Airway & Oxygen Therapy: Patient Spontanous Breathing and Patient connected to face mask oxygen  Post-op Assessment: Report given to RN and Post -op Vital signs reviewed and stable  Post vital signs: Reviewed and stable  Last Vitals:  Vitals Value Taken Time  BP    Temp    Pulse    Resp    SpO2      Last Pain:  Vitals:   04/24/23 1329  TempSrc:   PainSc: 3          Complications: No notable events documented.

## 2023-04-25 ENCOUNTER — Encounter: Payer: Self-pay | Admitting: General Surgery

## 2023-04-25 DIAGNOSIS — K8012 Calculus of gallbladder with acute and chronic cholecystitis without obstruction: Secondary | ICD-10-CM | POA: Diagnosis not present

## 2023-04-25 LAB — GLUCOSE, CAPILLARY
Glucose-Capillary: 197 mg/dL — ABNORMAL HIGH (ref 70–99)
Glucose-Capillary: 113 mg/dL — ABNORMAL HIGH (ref 70–99)

## 2023-04-25 MED ORDER — HYDROCODONE-ACETAMINOPHEN 5-325 MG PO TABS: 1.0000 | ORAL_TABLET | Freq: Four times a day (QID) | ORAL | 0 refills | Status: AC | PRN

## 2023-04-25 NOTE — Progress Notes (Signed)
 DISCHARGE NOTE:  Pt given discharge instructions and verbalized understanding. Pt wheeled to car by staff, granddaughter providing transporation home.

## 2023-04-25 NOTE — Discharge Summary (Signed)
 Patient ID: GAYLIA KASSEL MRN: [id number] DOB/AGE: 01-22-1940 83 y.o.  Admit date: 04/24/2023 Discharge date: 04/25/2023   Discharge Diagnoses:  Principal Problem:   Acute cholecystitis   Procedures: Robotic assisted laparoscopic cholecystectomy  Hospital Course: Patient admitted from the ED with acute cholecystitis.  She underwent cholecystectomy yesterday.  She has been recovering well.  The pain is controlled.  Patient tolerating diet.  Incision healing well.  Physical Exam Vitals reviewed.  Constitutional:      Appearance: She is well-developed.  HENT:     Head: Normocephalic.  Cardiovascular:     Rate and Rhythm: Normal rate and regular rhythm.     Heart sounds: Normal heart sounds.  Abdominal:     Palpations: Abdomen is soft.  Musculoskeletal:        General: Normal range of motion.     Cervical back: Normal range of motion.  Skin:    General: Skin is warm.     Capillary Refill: Capillary refill takes less than 2 seconds.  Neurological:     General: No focal deficit present.     Mental Status: She is alert.      Consults: None   Disposition: Discharge disposition: 01-Home or Self Care       Discharge Instructions     Diet - low sodium heart healthy   Complete by: As directed    Increase activity slowly   Complete by: As directed       Allergies as of 04/25/2023       Reactions   Clarithromycin Other (See Comments)   Dizziness   Neosporin [neomycin-bacitracin Zn-polymyx] Swelling   Sulfa Antibiotics Other (See Comments)   Hallucinations as a child   Thimerosal (thiomersal) Swelling   redness   Zostavax  [zoster Vaccine Live] Rash   Do Not Administer Zostavax to Pt!        Medication List     TAKE these medications    Accu-Chek FastClix Lancets Misc CHECK SUGAR ONCE DAILY. DX: E11.9   Accu-Chek Guide Me w/Device Kit   Accu-Chek SmartView test strip Generic drug: glucose blood USE TO CHECK BLOOD SUGAR ONCE A DAY   ascorbic  acid 500 MG tablet Commonly known as: VITAMIN C Take 500 mg by mouth daily.   diazepam 5 MG tablet Commonly known as: VALIUM Take 2.5 mg by mouth daily as needed for anxiety.   famotidine 20 MG tablet Commonly known as: PEPCID Take 20 mg by mouth daily as needed for heartburn or indigestion.   HYDROcodone-acetaminophen 5-325 MG tablet Commonly known as: NORCO/VICODIN Take 1 tablet by mouth every 6 (six) hours as needed for up to 3 days.   levothyroxine 50 MCG tablet Commonly known as: SYNTHROID Take 1 tablet (50 mcg total) by mouth daily before breakfast.   losartan 25 MG tablet Commonly known as: COZAAR Take 1 tablet by mouth at bedtime.   Magnesium 200 MG Tabs Take 200 mg by mouth 4 (four) times a week.   metFORMIN 500 MG 24 hr tablet Commonly known as: GLUCOPHAGE-XR TAKE 1 TABLET TWICE A DAY What changed:  how much to take when to take this   methocarbamol 500 MG tablet Commonly known as: ROBAXIN Take 500 mg by mouth as needed for muscle spasms.   PRESERVISION AREDS PO Take 1 tablet by mouth daily.   rosuvastatin 20 MG tablet Commonly known as: CRESTOR TAKE 1 TABLET DAILY   sertraline 100 MG tablet Commonly known as: ZOLOFT TAKE 1 TABLET DAILY What changed:  how much to take   Vitamin D 50 MCG (2000 UT) tablet Take 2,000 Units by mouth daily.        Follow-up Information     Eldred Grego, MD Follow up in 2 week(s).   Specialty: General Surgery Contact information: [address] [place] [address] [phone]

## 2023-04-25 NOTE — Discharge Instructions (Signed)

## 2023-04-25 NOTE — Care Management Obs Status (Deleted)
 MEDICARE OBSERVATION STATUS NOTIFICATION   Patient Details  Name: Nicole Young MRN: [id number] Date of Birth: 05-09-1940   Medicare Observation Status Notification Given:       Anise Kerns 04/25/2023, 11:07 AM

## 2023-04-25 NOTE — Care Management Obs Status (Signed)
 MEDICARE OBSERVATION STATUS NOTIFICATION   Patient Details  Name: Nicole Young MRN: [id number] Date of Birth: [birth year]   Medicare Observation Status Notification Given:  Yes    Anise Kerns 04/25/2023, 11:08 AM

## 2023-04-25 NOTE — Care Management Obs Status (Deleted)
 MEDICARE OBSERVATION STATUS NOTIFICATION   Patient Details  Name: Nicole Young MRN: [id number] Date of Birth: November 03, 1940   Medicare Observation Status Notification Given:       Anise Kerns 04/25/2023, 11:04 AM

## 2023-04-25 NOTE — Plan of Care (Signed)
  Problem: Clinical Measurements: Goal: Postoperative complications will be avoided or minimized Outcome: Progressing   

## 2023-04-25 NOTE — Plan of Care (Signed)
  Problem: Nutritional: Goal: Maintenance of adequate nutrition will improve Outcome: Progressing   Problem: Skin Integrity: Goal: Risk for impaired skin integrity will decrease Outcome: Progressing   Problem: Skin Integrity: Goal: Demonstration of wound healing without infection will improve Outcome: Progressing

## 2023-04-26 ENCOUNTER — Ambulatory Visit: Payer: Medicare Other | Admitting: Gastroenterology

## 2023-04-26 LAB — SURGICAL PATHOLOGY

## 2023-05-01 NOTE — Anesthesia Postprocedure Evaluation (Signed)
 Anesthesia Post Note  Patient: Nicole Young [id number]  Procedure(s) Performed: CHOLECYSTECTOMY, ROBOT-ASSISTED, LAPAROSCOPIC with UMBILICAL HERNIA REPAIR (Abdomen) REPAIR, HERNIA, UMBILICAL, ADULT (Abdomen)  Patient location during evaluation: PACU Anesthesia Type: General Level of consciousness: awake and alert Pain management: pain level controlled Vital Signs Assessment: post-procedure vital signs reviewed and stable Respiratory status: spontaneous breathing, nonlabored ventilation, respiratory function stable and patient connected to nasal cannula oxygen Cardiovascular status: blood pressure returned to baseline and stable Postop Assessment: no apparent nausea or vomiting Anesthetic complications: no   No notable events documented.   Last Vitals:  Vitals:   04/25/23 0747 04/25/23 1056  BP: (!) 114/53 (!) 120/58  Pulse: 79 82  Resp: 16 17  Temp: 36.8 C 36.9 C  SpO2: 95% 95%    Last Pain:  Vitals:   04/25/23 1155  TempSrc:   PainSc: 1                  Nicole Young

## 2023-05-02 ENCOUNTER — Ambulatory Visit: Admitting: Surgery

## 2023-05-10 ENCOUNTER — Ambulatory Visit: Admit: 2023-05-10 | Admitting: Surgery

## 2023-05-10 SURGERY — CHOLECYSTECTOMY, ROBOT-ASSISTED, LAPAROSCOPIC
Anesthesia: General

## 2023-05-21 ENCOUNTER — Ambulatory Visit: Admitting: Gastroenterology

## 2023-05-21 NOTE — Progress Notes (Deleted)
 Nicole Salaam MD, MRCP(U.K) [address]  [address]  [place], [place] [address]  Main: [phone]  Fax: [phone]   Primary Care Physician: Nicole Hove, MD  Primary Gastroenterologist:  Dr. Luke Young   No chief complaint on file.   HPI: Nicole Young is a 83 y.o. female   Summary of history :  She was initially referred and seen in May 2024 with Nicole Young for reflux.History of acid reflux over 20 years seen ENT specialist in the past has a history of globus sensation.  Occasional issues with dysphagia when she eats dry chicken.  Had been on 40 mg of Protonix  in the morning and 20 mg of famotidine  at night.  She subsequently underwent a upper endoscopy in July 24 with Dr. Ole Young that showed a small hiatal hernia erythema the gastric body antrum duodenum appeared normal.  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 pylori was noted was treated with quad triple tetracycline  and bismuth  based therapy.  09/27/2022 H. pylori breath test was negative.   01/16/2023 iron studies normal, HbA1c 7.2 hemoglobin 12.9 creatinine 0.69.  TSH normal.  She had a CT scan of the abdomen ordered for abdominal pain.  she has had over the past few months 3 episodes of epigastric pain usually occurring right after she eats lasting about 3 to 4 hours and on 2 occasions the pain stopped suddenly.  It was usually after a fatty meal no gallbladder problems in the past.  Since then she has cut off fat in her diet as well as fiber.  She does suffer from constipation in the past.  The pain did not change in relation to bowel habits.  Denies any NSAID use.  She has stopped using PPI with no reflux symptoms takes famotidine  as needed.  Denies any excessive gas or bloating at this point of time.  Presently has no abdominal pain.     Seen Dr Nicole Young 02/22/2023: She says that she requested the test to be done since she has a family history of colon polyps.  Denies any GI symptoms or self no blood  in the stool the test was positive 2/19/202%: HIDA scan shows low EF 9 % USG Abdomen : Chololithiasis.  Last colonoscopy in June 2016 by Dr. Marquita Young.  5 mm polyp in the rectum that was resected and hyperplastic.      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 DX: E11.9 102 each 12   ACCU-CHEK SMARTVIEW test strip USE TO CHECK BLOOD SUGAR ONCE A DAY 100 strip 2   ascorbic acid (VITAMIN C) 500 MG tablet Take 500 mg by mouth daily.     Blood Glucose Monitoring Suppl (ACCU-CHEK GUIDE ME) w/Device KIT      Cholecalciferol (VITAMIN D) 50 MCG (2000 UT) tablet Take 2,000 Units by mouth daily.     diazepam  (VALIUM ) 5 MG tablet Take 2.5 mg by mouth daily as needed for anxiety.     famotidine  (PEPCID ) 20 MG tablet Take 20 mg by mouth daily as needed for heartburn or indigestion.     levothyroxine  (SYNTHROID ) 50 MCG tablet Take 1 tablet (50 mcg total) by mouth daily before breakfast. 90 tablet 0  losartan  (COZAAR ) 25 MG tablet Take 1 tablet by mouth at bedtime.     Magnesium 200 MG TABS Take 200 mg by mouth 4 (four) times a week.     metFORMIN  (GLUCOPHAGE -XR) 500 MG 24 hr tablet TAKE 1 TABLET TWICE A DAY (Patient taking differently: Take 2,000 mg by mouth daily with supper.) 180 tablet 3   methocarbamol (ROBAXIN) 500 MG tablet Take 500 mg by mouth as needed for muscle spasms.     Multiple Vitamins-Minerals (PRESERVISION AREDS PO) Take 1 tablet by mouth daily.     rosuvastatin  (CRESTOR ) 20 MG tablet TAKE 1 TABLET DAILY 90 tablet 3   sertraline  (ZOLOFT ) 100 MG tablet TAKE 1 TABLET DAILY (Patient taking differently: Take 50 mg by mouth daily.) 90 tablet 3   No current  facility-administered medications for this visit.      ROS:  General: Negative for anorexia, weight loss, fever, chills, fatigue, weakness. ENT: Negative for hoarseness, difficulty swallowing , nasal congestion. CV: Negative for chest pain, angina, palpitations, dyspnea on exertion, peripheral edema.  Respiratory: Negative for dyspnea at rest, dyspnea on exertion, cough, sputum, wheezing.  GI: See history of present illness. GU:  Negative for dysuria, hematuria, urinary incontinence, urinary frequency, nocturnal urination.  Endo: Negative for unusual weight change.    Physical Examination:   There were no vitals taken for this visit.  General: Well-nourished, well-developed in no acute distress.  Eyes: No icterus. Conjunctivae pink. Mouth: Oropharyngeal mucosa moist and pink , no lesions erythema or exudate. Lungs: Clear to auscultation bilaterally. Non-labored. Heart: Regular rate and rhythm, no murmurs rubs or gallops.  Abdomen: Bowel sounds are normal, nontender, nondistended, no hepatosplenomegaly or masses, no abdominal bruits or hernia , no rebound or guarding.   Extremities: No lower extremity edema. No clubbing or deformities. Neuro: Alert and oriented x 3.  Grossly intact. Skin: Warm and dry, no jaundice.   Psych: Alert and cooperative, normal mood and affect.   Imaging Studies: US  ABDOMEN LIMITED RUQ (LIVER/GB) Result Date: 04/24/2023 CLINICAL DATA:  Upper abdominal pain EXAM: ULTRASOUND ABDOMEN LIMITED RIGHT UPPER QUADRANT COMPARISON:  02/28/2023 FINDINGS: Gallbladder: The gallbladder is distended. There is layering sludge and small stones identified within the gallbladder lumen. The gallbladder wall is thickened, new since prior examination, measuring up to 4 mm in thickness. No pericholecystic fluid is identified. The sonographic Nicole Young sign is reportedly negative. Common bile duct: Diameter: 5 mm in proximal diameter Liver: No focal lesion identified. Within normal  limits in parenchymal echogenicity. Portal vein is patent on color Doppler imaging with normal direction of blood flow towards the liver. Other: None. IMPRESSION: 1. Cholelithiasis and gallbladder sludge with new gallbladder wall thickening. These findings may reflect early changes of acute cholecystitis correlation with liver enzymes is recommended. If indicated, hepatobiliary scintigraphy may be helpful to assess patency of the cystic duct. Electronically Signed   By: Worthy Heads M.D.   On: 04/24/2023 03:04   DG Chest 2 View Result Date: 04/23/2023 CLINICAL DATA:  Chest pain EXAM: CHEST - 2 VIEW COMPARISON:  09/05/2021 FINDINGS: The heart size and mediastinal contours are within normal limits. Both lungs are clear. The visualized skeletal structures are unremarkable. IMPRESSION: No active cardiopulmonary disease. Electronically Signed   By: Violeta Grey M.D.   On: 04/23/2023 23:00    Assessment and Plan:   Nicole Young is a 83 y.o. y/o female was previously seen by myself for epigastric pain.  At that point of time I felt that her pain  was not biliary in nature and she had an ejection fraction of 8% of her gallbladder.  She also had a positive Cologuard test.  Subsequent colonoscopy showed no abnormalities.  She had been seen by Dr. 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  Dr Nicole Salaam  MD,MRCP [facility]) Follow up in ***

## 2023-05-29 ENCOUNTER — Encounter: Payer: Self-pay | Admitting: Internal Medicine

## 2023-05-29 ENCOUNTER — Ambulatory Visit (INDEPENDENT_AMBULATORY_CARE_PROVIDER_SITE_OTHER): Payer: Medicare Other | Admitting: Internal Medicine

## 2023-05-29 ENCOUNTER — Ambulatory Visit: Payer: Self-pay | Admitting: Internal Medicine

## 2023-05-29 VITALS — BP 120/62 | HR 81 | Ht 61.0 in | Wt 142.0 lb

## 2023-05-29 DIAGNOSIS — Z1151 Encounter for screening for human papillomavirus (HPV): Secondary | ICD-10-CM

## 2023-05-29 DIAGNOSIS — G2581 Restless legs syndrome: Secondary | ICD-10-CM

## 2023-05-29 DIAGNOSIS — E119 Type 2 diabetes mellitus without complications: Secondary | ICD-10-CM

## 2023-05-29 DIAGNOSIS — Z1389 Encounter for screening for other disorder: Secondary | ICD-10-CM

## 2023-05-29 DIAGNOSIS — M2550 Pain in unspecified joint: Secondary | ICD-10-CM | POA: Diagnosis not present

## 2023-05-29 DIAGNOSIS — Z0000 Encounter for general adult medical examination without abnormal findings: Secondary | ICD-10-CM

## 2023-05-29 DIAGNOSIS — E1159 Type 2 diabetes mellitus with other circulatory complications: Secondary | ICD-10-CM

## 2023-05-29 DIAGNOSIS — Z0001 Encounter for general adult medical examination with abnormal findings: Secondary | ICD-10-CM

## 2023-05-29 DIAGNOSIS — I152 Hypertension secondary to endocrine disorders: Secondary | ICD-10-CM

## 2023-05-29 DIAGNOSIS — G4733 Obstructive sleep apnea (adult) (pediatric): Secondary | ICD-10-CM

## 2023-05-29 DIAGNOSIS — N760 Acute vaginitis: Secondary | ICD-10-CM | POA: Diagnosis not present

## 2023-05-29 LAB — POCT URINALYSIS DIPSTICK
Bilirubin, UA: NEGATIVE
Blood, UA: NEGATIVE
Glucose, UA: NEGATIVE
Ketones, UA: NEGATIVE
Leukocytes, UA: NEGATIVE
Nitrite, UA: NEGATIVE
Protein, UA: NEGATIVE
Spec Grav, UA: 1.015 (ref 1.010–1.025)
Urobilinogen, UA: 0.2 U/dL
pH, UA: 6.5 (ref 5.0–8.0)

## 2023-05-29 LAB — POCT UA - MICROALBUMIN
Albumin/Creatinine Ratio, Urine, POC: <30
Creatinine, POC: 50 mg/dL
Microalbumin Ur, POC: 10 mg/L

## 2023-05-29 MED ORDER — LOSARTAN POTASSIUM 25 MG PO TABS: 25.0000 mg | ORAL_TABLET | Freq: Every day | ORAL | 3 refills | Status: DC

## 2023-05-29 NOTE — Progress Notes (Signed)
 Established Patient Office Visit  Subjective:  Patient ID: Nicole Young, female    DOB: 01/14/1940  Age: 83 y.o. MRN: [id number]  Chief Complaint  Patient presents with   Annual Exam    AWV    Patient comes in for her AWV.  She is generally feeling better now, status post lap cholecystectomy and colonoscopy this year.  She has already had her mammogram.  But she requests a Pap smear as well.  Although she does not have any vaginal itching or burning or discharge but she does have symptoms suggestive of atrophic vaginitis. Her PHQ-9/GAD score is 4/0.   Her CFS score is 0. She is currently under care of endocrinologist for her thyroid  and diabetes, and her medications are being adjusted by them.  However she is advised to take her metformin  as 2 tablets in the morning and 2 tablets at night.  Her most recent hemoglobin A1c was 7.5.  She was not able to take Farxiga or Jardiance, but Ozempic has been recommended.  Currently she is not taking it since her cholecystectomy was done recently.    No other concerns at this time.   Past Medical History:  Diagnosis Date   Acute bacterial sinusitis 11/18/2021   Allergic rhinitis    AMD (acid maltase deficiency) (HCC)    Anterior knee pain 02/10/2022   Arthralgia of multiple joints 11/12/2014   Biliary dyskinesia 2025   Bursitis of hip 02/10/2022   Carpal tunnel syndrome 06/30/2014   Cervical arthritis 07/06/2016   Cholelithiasis 2025   Complication of anesthesia    slow to wake up in past   Depression    controlled   Diabetes mellitus without complication (HCC)    controlled   GERD (gastroesophageal reflux disease)    Heart palpitations 07/06/2020   Hyperlipidemia    Hypertension    Hypothyroidism    Impingement syndrome of left shoulder 02/25/2021   Iron deficiency 01/15/2023   Lipoma of foot 02/10/2022   Neuropathy    Osteoarthritis    knee, ankles, shoulders, back, neck, hands/fingers, wrists;    Osteoarthritis of knee  10/10/2017   Premature ventricular contractions 08/09/2020   Pure hypercholesterolemia 07/06/2020   Rectocele    Sleep apnea    Thyroid  disease    Vaginal atrophy 08/16/2018   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[facility])     Past Surgical History:  Procedure Laterality Date   AUGMENTATION MAMMAPLASTY  1975   AUGMENTATION MAMMAPLASTY  2017   2nd augmentation and lift   BIOPSY  07/11/2022   Procedure: BIOPSY;  Surgeon: Marnee Sink, MD;  Location: [facility] ENDOSCOPY;  Service: Endoscopy;;   BREAST SURGERY     impants replaced with lift   CARPAL TUNNEL RELEASE Right 08/26/2014   Procedure: CARPAL TUNNEL RELEASE;  Surgeon: Marlynn Singer, MD;  Location: [facility] ORS;  Service: Orthopedics;  Laterality: Right;   COLONOSCOPY  2005   Dr [name]   COLONOSCOPY N/A 06/24/2014   Procedure: COLONOSCOPY;  Surgeon: Marshall Skeeter, MD;  Location: [facility] ENDOSCOPY;  Service: Endoscopy;  Laterality: N/A;   COLONOSCOPY N/A 04/11/2023   Procedure: COLONOSCOPY;  Surgeon: [name], MD;  Location: [facility] ENDOSCOPY;  Service: Gastroenterology;  Laterality: N/A;  please schedule in am, she is diabetic   ESOPHAGOGASTRODUODENOSCOPY (EGD) WITH PROPOFOL  N/A 07/11/2022   Procedure: ESOPHAGOGASTRODUODENOSCOPY (EGD) WITH PROPOFOL ;  Surgeon: Marnee Sink, MD;  Location: [facility] ENDOSCOPY;  Service: Endoscopy;  Laterality: N/A;   PARATHYROIDECTOMY  1992   PLACEMENT OF BREAST IMPLANTS  1975   POLYPECTOMY  04/11/2023   Procedure: POLYPECTOMY;  Surgeon: [name], MD;  Location: [facility] ENDOSCOPY;  Service: Gastroenterology;;   Rae Bugler, ADENOIDECTOMY, BILATERAL MYRINGOTOMY AND TUBES     UMBILICAL HERNIA REPAIR  04/24/2023   Procedure: REPAIR, HERNIA, UMBILICAL, ADULT;  Surgeon: Eldred Grego, MD;  Location: [facility] ORS;  Service: General;;    Social History   Socioeconomic History   Marital status: Widowed    Spouse name: Not on file   Number of children: 3   Years of education: Not on file   Highest education level:  Bachelor's degree (e.g., BA, AB, BS)  Occupational History   Occupation: retired  Tobacco Use   Smoking status: Never   Smokeless tobacco: Never  Vaping Use   Vaping status: Never Used  Substance and Sexual Activity   Alcohol  use: Yes    Alcohol /week: 1.0 standard drink of alcohol     Types: 1 Glasses of wine per week   Drug use: No   Sexual activity: Not on file  Other Topics Concern   Not on file  Social History Narrative   Not on file   Social Drivers of Health   Financial Resource Strain: Low Risk  (07/19/2022)   Received from [facility]   Overall Financial Resource Strain (CARDIA)    Difficulty of Paying Living Expenses: Not hard at all  Food Insecurity: No Food Insecurity (04/24/2023)   Hunger Vital Sign    Worried About Running Out of Food in the Last Year: Never true    Ran Out of Food in the Last Year: Never true  Transportation Needs: No Transportation Needs (04/24/2023)   PRAPARE - Administrator, Civil Service (Medical): No    Lack of Transportation (Non-Medical): No  Physical Activity: Insufficiently Active (05/29/2023)   Exercise Vital Sign    Days of Exercise per Week: 3 days    Minutes of Exercise per Session: 30 min  Stress: No Stress Concern Present (02/22/2021)   [employer] of Occupational Health - Occupational Stress Questionnaire    Feeling of Stress : Not at all  Social Connections: Socially Isolated (04/24/2023)   Social Connection and Isolation Panel [NHANES]    Frequency of Communication with Friends and Family: More than three times a week    Frequency of Social Gatherings with Friends and Family: More than three times a week    Attends Religious Services: Never    Database administrator or Organizations: No    Attends Banker Meetings: Never    Marital Status: Widowed  Intimate Partner Violence: Not At Risk (04/24/2023)   Humiliation, Afraid, Rape, and Kick questionnaire    Fear of Current or  Ex-Partner: No    Emotionally Abused: No    Physically Abused: No    Sexually Abused: No    Family History  Problem Relation Age of Onset   Cancer Father        prostate   Prostate cancer Father    Hypertension Mother    Heart attack Mother    Heart attack Maternal Grandfather    Diabetes Paternal Uncle    Congestive Heart Failure Maternal Grandmother    CVA Paternal Grandmother    Prostate cancer Paternal Grandfather    Breast cancer Maternal Aunt     Allergies  Allergen Reactions   Clarithromycin  Other (See Comments)    Dizziness   Neosporin [Neomycin-Bacitracin Zn-Polymyx] Swelling   Sulfa Antibiotics Other (See Comments)  Hallucinations as a child   Thimerosal (Thiomersal) Swelling    redness   Zostavax  [Zoster Vaccine Live] Rash    Do Not Administer Zostavax to Pt!    Outpatient Medications Prior to Visit  Medication Sig   Accu-Chek FastClix Lancets MISC CHECK SUGAR ONCE DAILY. DX: E11.9   ACCU-CHEK SMARTVIEW test strip USE TO CHECK BLOOD SUGAR ONCE A DAY   ascorbic acid (VITAMIN C) 500 MG tablet Take 500 mg by mouth daily.   Blood Glucose Monitoring Suppl (ACCU-CHEK GUIDE ME) w/Device KIT    Cholecalciferol (VITAMIN D) 50 MCG (2000 UT) tablet Take 2,000 Units by mouth daily.   diazepam  (VALIUM ) 5 MG tablet Take 2.5 mg by mouth daily as needed for anxiety.   famotidine  (PEPCID ) 20 MG tablet Take 20 mg by mouth daily as needed for heartburn or indigestion.   levothyroxine  (SYNTHROID ) 50 MCG tablet Take 1 tablet (50 mcg total) by mouth daily before breakfast.   Magnesium 200 MG TABS Take 200 mg by mouth 4 (four) times a week.   metFORMIN  (GLUCOPHAGE -XR) 500 MG 24 hr tablet TAKE 1 TABLET TWICE A DAY (Patient taking differently: Take 2,000 mg by mouth daily with supper.)   methocarbamol (ROBAXIN) 500 MG tablet Take 500 mg by mouth as needed for muscle spasms.   Multiple Vitamins-Minerals (PRESERVISION AREDS PO) Take 1 tablet by mouth daily.   rosuvastatin  (CRESTOR )  20 MG tablet TAKE 1 TABLET DAILY   sertraline  (ZOLOFT ) 100 MG tablet TAKE 1 TABLET DAILY (Patient taking differently: Take 50 mg by mouth daily.)   [DISCONTINUED] losartan  (COZAAR ) 25 MG tablet Take 1 tablet by mouth at bedtime.   No facility-administered medications prior to visit.    Review of Systems  Constitutional: Negative.  Negative for chills, diaphoresis, fever, malaise/fatigue and weight loss.  HENT: Negative.  Negative for sore throat.   Eyes: Negative.   Respiratory: Negative.  Negative for cough and shortness of breath.   Cardiovascular: Negative.  Negative for chest pain, palpitations and leg swelling.  Gastrointestinal: Negative.  Negative for abdominal pain, blood in stool, constipation, diarrhea, heartburn, melena, nausea and vomiting.  Genitourinary: Negative.  Negative for dysuria and flank pain.  Musculoskeletal: Negative.  Negative for joint pain and myalgias.  Skin: Negative.   Neurological: Negative.  Negative for dizziness, tingling and headaches.  Endo/Heme/Allergies: Negative.   Psychiatric/Behavioral: Negative.  Negative for depression and suicidal ideas. The patient is not nervous/anxious.        Objective:   BP 120/62   Pulse 81   Ht 5\' 1"  (1.549 m)   Wt 142 lb (64.4 kg)   SpO2 95%   BMI 26.83 kg/m   Vitals:   05/29/23 1019  BP: 120/62  Pulse: 81  Height: 5\' 1"  (1.549 m)  Weight: 142 lb (64.4 kg)  SpO2: 95%  BMI (Calculated): 26.84    Physical Exam Vitals and nursing note reviewed. Exam conducted with a chaperone present.  Constitutional:      Appearance: Normal appearance.  HENT:     Head: Normocephalic and atraumatic.     Nose: Nose normal.     Mouth/Throat:     Mouth: Mucous membranes are moist.     Pharynx: Oropharynx is clear.  Eyes:     Conjunctiva/sclera: Conjunctivae normal.     Pupils: Pupils are equal, round, and reactive to light.  Cardiovascular:     Rate and Rhythm: Normal rate and regular rhythm.     Pulses: Normal  pulses.  Heart sounds: Normal heart sounds. No murmur heard. Pulmonary:     Effort: Pulmonary effort is normal.     Breath sounds: Normal breath sounds. No wheezing.  Chest:  Breasts:    Right: Normal. No swelling, bleeding, inverted nipple, mass, nipple discharge, skin change or tenderness.     Left: Normal. No swelling, bleeding, inverted nipple, mass, nipple discharge, skin change or tenderness.  Abdominal:     General: Bowel sounds are normal.     Palpations: Abdomen is soft.     Tenderness: There is no abdominal tenderness. There is no right CVA tenderness or left CVA tenderness.     Hernia: There is no hernia in the left inguinal area or right inguinal area.  Genitourinary:    General: Normal vulva.     Pubic Area: No rash or pubic lice.      Labia:        Right: No rash, tenderness, lesion or injury.        Left: No rash, tenderness, lesion or injury.      Urethra: No prolapse.     Vagina: Normal. No signs of injury and foreign body. No vaginal discharge, erythema, tenderness, bleeding, lesions or prolapsed vaginal walls.     Cervix: Normal.     Uterus: Normal.      Adnexa: Right adnexa normal and left adnexa normal.       Right: No mass, tenderness or fullness.         Left: No mass, tenderness or fullness.    Musculoskeletal:        General: Normal range of motion.     Cervical back: Normal range of motion.     Right lower leg: No edema.     Left lower leg: No edema.  Lymphadenopathy:     Upper Body:     Right upper body: No supraclavicular, axillary or pectoral adenopathy.     Left upper body: No supraclavicular, axillary or pectoral adenopathy.     Lower Body: No right inguinal adenopathy. No left inguinal adenopathy.  Skin:    General: Skin is warm and dry.  Neurological:     General: No focal deficit present.     Mental Status: She is alert and oriented to person, place, and time.  Psychiatric:        Mood and Affect: Mood normal.        Behavior: Behavior  normal.      Results for orders placed or performed in visit on 05/29/23  POCT Urinalysis Dipstick (81002)  Result Value Ref Range   Color, UA     Clarity, UA     Glucose, UA Negative Negative   Bilirubin, UA Negative    Ketones, UA Negative    Spec Grav, UA 1.015 1.010 - 1.025   Blood, UA Negative    pH, UA 6.5 5.0 - 8.0   Protein, UA Negative Negative   Urobilinogen, UA 0.2 0.2 or 1.0 E.U./dL   Nitrite, UA Negative    Leukocytes, UA Negative Negative   Appearance     Odor    POCT UA - Microalbumin  Result Value Ref Range   Microalbumin Ur, POC 10 mg/L   Creatinine, POC 50 mg/dL   Albumin/Creatinine Ratio, Urine, POC <30     Recent Results (from the past 2160 hours)  Surgical pathology     Status: None   Collection Time: 04/11/23 12:00 AM  Result Value Ref Range   SURGICAL PATHOLOGY  SURGICAL PATHOLOGY [facility] [address], [address] [place], [place] [address] Telephone [phone] or [phone] Fax [phone]  REPORT OF SURGICAL PATHOLOGY   Accession #: [id number] Patient Name: DEAISHA, WELBORN Visit # : [id number]  MRN: [id number] Physician: [name] DOB/Age 08-Nov-1940 (Age: 80) Gender: F Collected Date: 04/11/2023 Received Date: 04/11/2023  FINAL DIAGNOSIS       1. Transverse Colon Polyp, cold snare :       - SESSILE SERRATED POLYP.      - NEGATIVE FOR DYSPLASIA AND MALIGNANCY.       DATE SIGNED OUT: 04/12/2023 ELECTRONIC SIGNATURE : Brunetta Capes Md, Alexandria Ida , Pathologist, Electronic Signature  MICROSCOPIC DESCRIPTION  CASE COMMENTS STAINS USED IN DIAGNOSIS: H&E    CLINICAL HISTORY  SPECIMEN(S) OBTAINED 1. Transverse Colon Polyp, Cold Snare  SPECIMEN COMMENTS: SPECIMEN CLINICAL INFORMATION: 1. Colon polyp    Gross Description 1. "Cold snare polyp transverse colon", received in formalin is a  single 0.3 x 0.3 x 0.1 cm white-gray tissue fragment. The specimen is submitted in toto in 1 block (1A).       AMG 04/11/2023        Report signed out from the following location(s) [place] [address], [place] [address] CLIA #: [id number]  [facility] [address] [place], [place] [address] CLIA #: [id number]   Glucose, capillary     Status: Abnormal   Collection Time: 04/11/23  8:10 AM  Result Value Ref Range   Glucose-Capillary 166 (H) 70 - 99 mg/dL    Comment: Glucose reference range applies only to samples taken after fasting for at least 8 hours.  Comprehensive metabolic panel     Status: Abnormal   Collection Time: 04/23/23 10:21 PM  Result Value Ref Range   Sodium 133 (L) 135 - 145 mmol/L   Potassium 3.5 3.5 - 5.1 mmol/L   Chloride 98 98 - 111 mmol/L   CO2 24 22 - 32 mmol/L   Glucose, Bld 153 (H) 70 - 99 mg/dL    Comment: Glucose reference range applies only to samples taken after fasting for at least 8 hours.   BUN 17 8 - 23 mg/dL   Creatinine, Ser 3.81 0.44 - 1.00 mg/dL   Calcium  9.4 8.9 - 10.3 mg/dL   Total Protein 6.9 6.5 - 8.1 g/dL   Albumin 4.3 3.5 - 5.0 g/dL   AST 19 15 - 41 U/L   ALT 15 0 - 44 U/L   Alkaline Phosphatase 33 (L) 38 - 126 U/L   Total Bilirubin 0.6 0.0 - 1.2 mg/dL   GFR, Estimated >82 >99 mL/min    Comment: (NOTE) Calculated using the CKD-EPI Creatinine Equation (2021)    Anion gap 11 5 - 15    Comment: Performed at [facility], [address] [place], [place] [address]  Troponin I (High Sensitivity)     Status: None   Collection Time: 04/23/23 10:21 PM  Result Value Ref Range   Troponin I (High Sensitivity) 5 <18 ng/L    Comment: (NOTE) Elevated high sensitivity troponin I (hsTnI) values and significant  changes across serial measurements may suggest ACS but many other  chronic and acute conditions are known to elevate hsTnI results.  Refer to the "Links" section for chest pain algorithms and additional  guidance. Performed at [facility], [address]  [place], [place] [address]   Lipase, blood     Status: None  Collection Time: 04/23/23 10:21 PM  Result Value Ref Range   Lipase 38 11 - 51 U/L    Comment: Performed at [facility], [address] [place], [place] [address]  CBC with Differential     Status: Abnormal   Collection Time: 04/23/23 10:21 PM  Result Value Ref Range   WBC 13.5 (H) 4.0 - 10.5 K/uL   RBC 3.92 3.87 - 5.11 MIL/uL   Hemoglobin 11.8 (L) 12.0 - 15.0 g/dL   HCT 60.4 (L) 54.0 - 98.1 %   MCV 90.6 80.0 - 100.0 fL   MCH 30.1 26.0 - 34.0 pg   MCHC 33.2 30.0 - 36.0 g/dL   RDW 19.1 47.8 - 29.5 %   Platelets 227 150 - 400 K/uL   nRBC 0.0 0.0 - 0.2 %   Neutrophils Relative % 66 %   Neutro Abs 9.0 (H) 1.7 - 7.7 K/uL   Lymphocytes Relative 24 %   Lymphs Abs 3.3 0.7 - 4.0 K/uL   Monocytes Relative 7 %   Monocytes Absolute 0.9 0.1 - 1.0 K/uL   Eosinophils Relative 1 %   Eosinophils Absolute 0.1 0.0 - 0.5 K/uL   Basophils Relative 1 %   Basophils Absolute 0.1 0.0 - 0.1 K/uL   Immature Granulocytes 1 %   Abs Immature Granulocytes 0.11 (H) 0.00 - 0.07 K/uL    Comment: Performed at [facility], [address] [place], [place] [address]  Surgical pathology     Status: None   Collection Time: 04/24/23 12:00 AM  Result Value Ref Range   SURGICAL PATHOLOGY      SURGICAL PATHOLOGY [facility] [address], [address] [place], [place] [address] Telephone [phone] or [phone] Fax [phone]  REPORT OF SURGICAL PATHOLOGY   Accession #: [id number] Patient Name: EARLEAN, FIDALGO Visit # : [id number]  MRN: [id number] Physician: Eldred Grego DOB/Age Nov 23, 1940 (Age: 75) Gender: F Collected Date: 04/24/2023 Received Date: 04/25/2023  FINAL DIAGNOSIS       1. Gallbladder,  :       CHRONIC CHOLECYSTITIS.      CHOLELITHIASIS.       DATE SIGNED OUT: 04/26/2023 ELECTRONIC SIGNATURE : Earleen Glazier, John, Pathologist, Electronic Signature  MICROSCOPIC  DESCRIPTION  CASE COMMENTS STAINS USED IN DIAGNOSIS: H&E    CLINICAL HISTORY  SPECIMEN(S) OBTAINED 1. Gallbladder,  SPECIMEN COMMENTS: SPECIMEN CLINICAL INFORMATION: 1. Cholecystitis    Gross Description 1. Size/?Intact: 9.4 x 4.3 cm; intact      Serosal surface: Gray-tan, smooth, and glistening with a moderate amount of       adipose tissue and a mildly roughened hepatic bed.      Mucosa/Wall: Green-yellow, velvety mucosa; 0.1-0.4 cm thick wall with mild edema      between the layers. Discrete lesions are not identified.      Contents: Moderate amount of tenacious, green bile admixed with a 5.0 x 3.5 x      0.3 cm aggregate of mucinous, yellow-green, opaque material. Two black,      multifaceted, friable stones are present, each up to 0.5 cm in greatest      dimension.      Cystic duct: 0.4 cm diameter, patent; received with a single white, plastic      clamp; inked black.      Block Summary: Representative sections including the cystic duct margin (en      face) are submitted in 1 block (1A).      AMG  04/25/2023        Report signed out from the following location(s) [place] [address], [place] [address] CLIA #: [id number]  [facility] [address] [place], [place] [address] CLIA #: [id number]   Urinalysis, Routine w reflex microscopic -Urine, Clean Catch     Status: Abnormal   Collection Time: 04/24/23  1:40 AM  Result Value Ref Range   Color, Urine YELLOW (A) YELLOW   APPearance HAZY (A) CLEAR   Specific Gravity, Urine 1.016 1.005 - 1.030   pH 5.0 5.0 - 8.0   Glucose, UA NEGATIVE NEGATIVE mg/dL   Hgb urine dipstick NEGATIVE NEGATIVE   Bilirubin Urine NEGATIVE NEGATIVE   Ketones, ur 5 (A) NEGATIVE mg/dL   Protein, ur NEGATIVE NEGATIVE mg/dL   Nitrite NEGATIVE NEGATIVE   Leukocytes,Ua NEGATIVE NEGATIVE    Comment: Performed at [facility], [address] [place], [place] [address]  CBG  monitoring, ED     Status: Abnormal   Collection Time: 04/24/23  4:07 AM  Result Value Ref Range   Glucose-Capillary 164 (H) 70 - 99 mg/dL    Comment: Glucose reference range applies only to samples taken after fasting for at least 8 hours.  CBG monitoring, ED     Status: Abnormal   Collection Time: 04/24/23  7:14 AM  Result Value Ref Range   Glucose-Capillary 107 (H) 70 - 99 mg/dL    Comment: Glucose reference range applies only to samples taken after fasting for at least 8 hours.  CBG monitoring, ED     Status: Abnormal   Collection Time: 04/24/23  8:51 AM  Result Value Ref Range   Glucose-Capillary 100 (H) 70 - 99 mg/dL    Comment: Glucose reference range applies only to samples taken after fasting for at least 8 hours.  CBG monitoring, ED     Status: Abnormal   Collection Time: 04/24/23 12:55 PM  Result Value Ref Range   Glucose-Capillary 107 (H) 70 - 99 mg/dL    Comment: Glucose reference range applies only to samples taken after fasting for at least 8 hours.  CBG monitoring, ED     Status: Abnormal   Collection Time: 04/24/23  5:40 PM  Result Value Ref Range   Glucose-Capillary 153 (H) 70 - 99 mg/dL    Comment: Glucose reference range applies only to samples taken after fasting for at least 8 hours.  Glucose, capillary     Status: Abnormal   Collection Time: 04/24/23  8:00 PM  Result Value Ref Range   Glucose-Capillary 229 (H) 70 - 99 mg/dL    Comment: Glucose reference range applies only to samples taken after fasting for at least 8 hours.   Comment 1 Notify RN    Comment 2 Document in Chart   Glucose, capillary     Status: Abnormal   Collection Time: 04/24/23 10:54 PM  Result Value Ref Range   Glucose-Capillary 225 (H) 70 - 99 mg/dL    Comment: Glucose reference range applies only to samples taken after fasting for at least 8 hours.  Glucose, capillary     Status: Abnormal   Collection Time: 04/25/23  3:19 AM  Result Value Ref Range   Glucose-Capillary 197 (H) 70 - 99  mg/dL    Comment: Glucose reference range applies only to samples taken after fasting for at least 8 hours.   Comment 1 Notify RN    Comment 2 Document in Chart  Glucose, capillary     Status: Abnormal   Collection Time: 04/25/23  7:53 AM  Result Value Ref Range   Glucose-Capillary 113 (H) 70 - 99 mg/dL    Comment: Glucose reference range applies only to samples taken after fasting for at least 8 hours.  POCT Urinalysis Dipstick (14782)     Status: Normal   Collection Time: 05/29/23 10:56 AM  Result Value Ref Range   Color, UA     Clarity, UA     Glucose, UA Negative Negative   Bilirubin, UA Negative    Ketones, UA Negative    Spec Grav, UA 1.015 1.010 - 1.025   Blood, UA Negative    pH, UA 6.5 5.0 - 8.0   Protein, UA Negative Negative   Urobilinogen, UA 0.2 0.2 or 1.0 E.U./dL   Nitrite, UA Negative    Leukocytes, UA Negative Negative   Appearance     Odor    POCT UA - Microalbumin     Status: Normal   Collection Time: 05/29/23 11:56 AM  Result Value Ref Range   Microalbumin Ur, POC 10 mg/L   Creatinine, POC 50 mg/dL   Albumin/Creatinine Ratio, Urine, POC <30       Assessment & Plan:  Patient will continue her current medications.  Monitor blood sugar at home.  Will discuss further adjustment with her endocrinologist. Problem List Items Addressed This Visit     Arthralgia of multiple joints   Diabetes mellitus (HCC)   Relevant Medications   losartan  (COZAAR ) 25 MG tablet   Other Relevant Orders   POCT UA - Microalbumin (Completed)   Hypertension associated with diabetes (HCC)   Relevant Medications   losartan  (COZAAR ) 25 MG tablet   Restless leg   OSA on CPAP   Other Visit Diagnoses       Medicare annual wellness visit, subsequent    -  Primary     Screening for blood or protein in urine       Relevant Orders   POCT Urinalysis Dipstick (95621) (Completed)     Screening for HPV (human papillomavirus)       Relevant Orders   IGP, Aptima HPV, rfx 16/18,45      Acute vaginitis       Relevant Orders   IGP, Aptima HPV, rfx 16/18,45       Return in about 3 months (around 08/29/2023).   Total time spent: 30 minutes  [name], MD  05/29/2023   This document may have been prepared by [facility] Voice Recognition software and as such may include unintentional dictation errors.

## 2023-06-01 LAB — IGP, APTIMA HPV, RFX 16/18,45
HPV Aptima: NEGATIVE
PAP Smear Comment: 0

## 2023-06-01 LAB — SPECIMEN STATUS REPORT

## 2023-07-02 ENCOUNTER — Ambulatory Visit: Admitting: Surgery

## 2023-07-15 NOTE — Progress Notes (Signed)
 [facility] [address] [place], [place] [address]  Pulmonary Sleep Medicine   Office Visit Note  Patient Name: Nicole Young DOB: December 10, 1940 MRN [id number]    Chief Complaint: Obstructive Sleep Apnea visit  Brief History:  October is seen today for a follow up visit for CPAP@ 7 cmH2O. The patient has a 9 month history of sleep apnea. Patient is using PAP nightly.  The patient feels rested after sleeping with PAP.  The patient reports benefiting from PAP use. Reported sleepiness is  improved and the Epworth Sleepiness Score is 7 out of 24. The patient will occasionally take naps. The patient complains of the following: none.  The compliance download shows  82% compliance with an average use time of 6 hours 53 minutes. The AHI is 0.8.  The patient does complain of limb movements disrupting sleep. The patient continues to require PAP therapy in order to eliminate sleep apnea.   ROS  General: (-) fever, (-) chills, (-) night sweat Nose and Sinuses: (-) nasal stuffiness or itchiness, (-) postnasal drip, (-) nosebleeds, (-) sinus trouble. Mouth and Throat: (-) sore throat, (-) hoarseness. Neck: (-) swollen glands, (-) enlarged thyroid , (-) neck pain. Respiratory: - cough, - shortness of breath, - wheezing. Neurologic: - numbness, - tingling. Psychiatric: - anxiety, - depression   Current Medication: Outpatient Encounter Medications as of 07/16/2023  Medication Sig   Accu-Chek FastClix Lancets MISC CHECK SUGAR ONCE DAILY. DX: E11.9   ACCU-CHEK SMARTVIEW test strip USE TO CHECK BLOOD SUGAR ONCE A DAY   ascorbic acid (VITAMIN C) 500 MG tablet Take 500 mg by mouth daily.   Blood Glucose Monitoring Suppl (ACCU-CHEK GUIDE ME) w/Device KIT    Cholecalciferol (VITAMIN D) 50 MCG (2000 UT) tablet Take 2,000 Units by mouth daily.   diazepam  (VALIUM ) 5 MG tablet Take 2.5 mg by mouth daily as needed for anxiety.   famotidine  (PEPCID ) 20 MG tablet Take 20 mg by mouth daily as  needed for heartburn or indigestion.   levothyroxine  (SYNTHROID ) 50 MCG tablet Take 1 tablet (50 mcg total) by mouth daily before breakfast.   losartan  (COZAAR ) 25 MG tablet Take 1 tablet (25 mg total) by mouth at bedtime.   Magnesium 200 MG TABS Take 200 mg by mouth 4 (four) times a week.   metFORMIN  (GLUCOPHAGE -XR) 500 MG 24 hr tablet TAKE 1 TABLET TWICE A DAY (Patient taking differently: Take 2,000 mg by mouth daily with supper.)   methocarbamol (ROBAXIN) 500 MG tablet Take 500 mg by mouth as needed for muscle spasms.   Multiple Vitamins-Minerals (PRESERVISION AREDS PO) Take 1 tablet by mouth daily.   rosuvastatin  (CRESTOR ) 20 MG tablet TAKE 1 TABLET DAILY   sertraline  (ZOLOFT ) 100 MG tablet TAKE 1 TABLET DAILY (Patient taking differently: Take 50 mg by mouth daily.)   No facility-administered encounter medications on file as of 07/16/2023.    Surgical History: Past Surgical History:  Procedure Laterality Date   AUGMENTATION MAMMAPLASTY  1975   AUGMENTATION MAMMAPLASTY  2017   2nd augmentation and lift   BIOPSY  07/11/2022   Procedure: BIOPSY;  Surgeon: Jinny Carmine, MD;  Location: [facility] ENDOSCOPY;  Service: Endoscopy;;   BREAST SURGERY     impants replaced with lift   CARPAL TUNNEL RELEASE Right 08/26/2014   Procedure: CARPAL TUNNEL RELEASE;  Surgeon: Kayla Pinal, MD;  Location: [facility] ORS;  Service: Orthopedics;  Laterality: Right;   COLONOSCOPY  2005   Dr Dessa   COLONOSCOPY N/A 06/24/2014  Procedure: COLONOSCOPY;  Surgeon: Reyes LELON Cota, MD;  Location: [facility] ENDOSCOPY;  Service: Endoscopy;  Laterality: N/A;   COLONOSCOPY N/A 04/11/2023   Procedure: COLONOSCOPY;  Surgeon: Therisa Bi, MD;  Location: [facility] ENDOSCOPY;  Service: Gastroenterology;  Laterality: N/A;  please schedule in am, she is diabetic   ESOPHAGOGASTRODUODENOSCOPY (EGD) WITH PROPOFOL  N/A 07/11/2022   Procedure: ESOPHAGOGASTRODUODENOSCOPY (EGD) WITH PROPOFOL ;  Surgeon: Jinny Carmine, MD;  Location: [facility] ENDOSCOPY;   Service: Endoscopy;  Laterality: N/A;   PARATHYROIDECTOMY  1992   PLACEMENT OF BREAST IMPLANTS  1975   POLYPECTOMY  04/11/2023   Procedure: POLYPECTOMY;  Surgeon: Therisa Bi, MD;  Location: [facility] ENDOSCOPY;  Service: Gastroenterology;;   MARINE, ADENOIDECTOMY, BILATERAL MYRINGOTOMY AND TUBES     UMBILICAL HERNIA REPAIR  04/24/2023   Procedure: REPAIR, HERNIA, UMBILICAL, ADULT;  Surgeon: Rodolph Romano, MD;  Location: [facility] ORS;  Service: General;;    Medical History: Past Medical History:  Diagnosis Date   Acute bacterial sinusitis 11/18/2021   Allergic rhinitis    AMD (acid maltase deficiency) (HCC)    Anterior knee pain 02/10/2022   Arthralgia of multiple joints 11/12/2014   Biliary dyskinesia 2025   Bursitis of hip 02/10/2022   Carpal tunnel syndrome 06/30/2014   Cervical arthritis 07/06/2016   Cholelithiasis 2025   Complication of anesthesia    slow to wake up in past   Depression    controlled   Diabetes mellitus without complication (HCC)    controlled   GERD (gastroesophageal reflux disease)    Heart palpitations 07/06/2020   Hyperlipidemia    Hypertension    Hypothyroidism    Impingement syndrome of left shoulder 02/25/2021   Iron deficiency 01/15/2023   Lipoma of foot 02/10/2022   Neuropathy    Osteoarthritis    knee, ankles, shoulders, back, neck, hands/fingers, wrists;    Osteoarthritis of knee 10/10/2017   Premature ventricular contractions 08/09/2020   Pure hypercholesterolemia 07/06/2020   Rectocele    Sleep apnea    Thyroid  disease    Vaginal atrophy 08/16/2018   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HCC)     Family History: Non contributory to the present illness  Social History: Social History   Socioeconomic History   Marital status: Widowed    Spouse name: Not on file   Number of children: 3   Years of education: Not on file   Highest education level: Bachelor's degree (e.g., BA, AB, BS)  Occupational History   Occupation: retired   Tobacco Use   Smoking status: Never   Smokeless tobacco: Never  Vaping Use   Vaping status: Never Used  Substance and Sexual Activity   Alcohol  use: Yes    Alcohol /week: 1.0 standard drink of alcohol     Types: 1 Glasses of wine per week   Drug use: No   Sexual activity: Not on file  Other Topics Concern   Not on file  Social History Narrative   Not on file   Social Drivers of Health   Financial Resource Strain: Low Risk  (07/19/2022)   Received from [facility]   Overall Financial Resource Strain (CARDIA)    Difficulty of Paying Living Expenses: Not hard at all  Food Insecurity: No Food Insecurity (04/24/2023)   Hunger Vital Sign    Worried About Running Out of Food in the Last Year: Never true    Ran Out of Food in the Last Year: Never true  Transportation Needs: No Transportation Needs (04/24/2023)   PRAPARE - Transportation  Lack of Transportation (Medical): No    Lack of Transportation (Non-Medical): No  Physical Activity: Insufficiently Active (05/29/2023)   Exercise Vital Sign    Days of Exercise per Week: 3 days    Minutes of Exercise per Session: 30 min  Stress: No Stress Concern Present (02/22/2021)   [employer] of Occupational Health - Occupational Stress Questionnaire    Feeling of Stress : Not at all  Social Connections: Socially Isolated (04/24/2023)   Social Connection and Isolation Panel    Frequency of Communication with Friends and Family: More than three times a week    Frequency of Social Gatherings with Friends and Family: More than three times a week    Attends Religious Services: Never    Database administrator or Organizations: No    Attends Banker Meetings: Never    Marital Status: Widowed  Intimate Partner Violence: Not At Risk (04/24/2023)   Humiliation, Afraid, Rape, and Kick questionnaire    Fear of Current or Ex-Partner: No    Emotionally Abused: No    Physically Abused: No    Sexually Abused: No     Vital Signs: There were no vitals taken for this visit. There is no height or weight on file to calculate BMI.    Examination: General Appearance: The patient is well-developed, well-nourished, and in no distress. Neck Circumference: 36 cm Skin: Gross inspection of skin unremarkable. Head: normocephalic, no gross deformities. Eyes: no gross deformities noted. ENT: ears appear grossly normal Neurologic: Alert and oriented. No involuntary movements.  STOP BANG RISK ASSESSMENT S (snore) Have you been told that you snore?     NO   T (tired) Are you often tired, fatigued, or sleepy during the day?   NO  O (obstruction) Do you stop breathing, choke, or gasp during sleep? NO   P (pressure) Do you have or are you being treated for high blood pressure? YES   B (BMI) Is your body index greater than 35 kg/m? NO   A (age) Are you 52 years old or older? YES   N (neck) Do you have a neck circumference greater than 16 inches?   NO   G (gender) Are you a female? NO   TOTAL STOP/BANG "YES" ANSWERS 2       A STOP-Bang score of 2 or less is considered low risk, and a score of 5 or more is high risk for having either moderate or severe OSA. For people who score 3 or 4, doctors may need to perform further assessment to determine how likely they are to have OSA.         EPWORTH SLEEPINESS SCALE:  Scale:  (0)= no chance of dozing; (1)= slight chance of dozing; (2)= moderate chance of dozing; (3)= high chance of dozing  Chance  Situtation    Sitting and reading: 1    Watching TV: 1    Sitting Inactive in public: 0    As a passenger in car: 1      Lying down to rest: 2    Sitting and talking: 0    Sitting quielty after lunch: 2    In a car, stopped in traffic: 0   TOTAL SCORE:   7 out of 24    SLEEP STUDIES:  PSG (10/2022) AHI 7.3/hr, REM AHI 54.3/hr,  min SpO2 82% Titration (10/2022) CPAP@ 5 cmH2O   CPAP COMPLIANCE DATA:  Date Range:  03/09/2023-07/11/2023  Average Daily Use: 6 hours 53 minutes  Median Use: 7 hours 1  minute  Compliance for > 4 Hours: 82%  AHI: 0.8 respiratory events per hour  Days Used: 107/125 days  Mask Leak: 8.1  95th Percentile Pressure: 7         LABS: Recent Results (from the past 2160 hours)  Comprehensive metabolic panel     Status: Abnormal   Collection Time: 04/23/23 10:21 PM  Result Value Ref Range   Sodium 133 (L) 135 - 145 mmol/L   Potassium 3.5 3.5 - 5.1 mmol/L   Chloride 98 98 - 111 mmol/L   CO2 24 22 - 32 mmol/L   Glucose, Bld 153 (H) 70 - 99 mg/dL    Comment: Glucose reference range applies only to samples taken after fasting for at least 8 hours.   BUN 17 8 - 23 mg/dL   Creatinine, Ser 9.32 0.44 - 1.00 mg/dL   Calcium  9.4 8.9 - 10.3 mg/dL   Total Protein 6.9 6.5 - 8.1 g/dL   Albumin 4.3 3.5 - 5.0 g/dL   AST 19 15 - 41 U/L   ALT 15 0 - 44 U/L   Alkaline Phosphatase 33 (L) 38 - 126 U/L   Total Bilirubin 0.6 0.0 - 1.2 mg/dL   GFR, Estimated >39 >39 mL/min    Comment: (NOTE) Calculated using the CKD-EPI Creatinine Equation (2021)    Anion gap 11 5 - 15    Comment: Performed at [facility], [address] [place], [place] [address]  Troponin I (High Sensitivity)     Status: None   Collection Time: 04/23/23 10:21 PM  Result Value Ref Range   Troponin I (High Sensitivity) 5 <18 ng/L    Comment: (NOTE) Elevated high sensitivity troponin I (hsTnI) values and significant  changes across serial measurements may suggest ACS but many other  chronic and acute conditions are known to elevate hsTnI results.  Refer to the Links section for chest pain algorithms and additional  guidance. Performed at [facility], [address] [place], [place] [address]   Lipase, blood     Status: None   Collection Time: 04/23/23 10:21 PM  Result Value Ref Range   Lipase 38 11 - 51 U/L    Comment: Performed at [facility], [address]  [place], [place] [address]  CBC with Differential     Status: Abnormal   Collection Time: 04/23/23 10:21 PM  Result Value Ref Range   WBC 13.5 (H) 4.0 - 10.5 K/uL   RBC 3.92 3.87 - 5.11 MIL/uL   Hemoglobin 11.8 (L) 12.0 - 15.0 g/dL   HCT 64.4 (L) 63.9 - 53.9 %   MCV 90.6 80.0 - 100.0 fL   MCH 30.1 26.0 - 34.0 pg   MCHC 33.2 30.0 - 36.0 g/dL   RDW 86.7 88.4 - 84.4 %   Platelets 227 150 - 400 K/uL   nRBC 0.0 0.0 - 0.2 %   Neutrophils Relative % 66 %   Neutro Abs 9.0 (H) 1.7 - 7.7 K/uL   Lymphocytes Relative 24 %   Lymphs Abs 3.3 0.7 - 4.0 K/uL   Monocytes Relative 7 %   Monocytes Absolute 0.9 0.1 - 1.0 K/uL   Eosinophils Relative 1 %   Eosinophils Absolute 0.1 0.0 - 0.5 K/uL   Basophils Relative 1 %   Basophils Absolute 0.1 0.0 - 0.1 K/uL   Immature Granulocytes 1 %   Abs Immature Granulocytes 0.11 (H) 0.00 - 0.07 K/uL    Comment:  Performed at [facility], [address] [place] [place], [place] [address]  Surgical pathology     Status: None   Collection Time: 04/24/23 12:00 AM  Result Value Ref Range   SURGICAL PATHOLOGY      SURGICAL PATHOLOGY [facility] [address], [address] [place], [place] [address] Telephone [phone] or [phone] Fax [phone]  REPORT OF SURGICAL PATHOLOGY   Accession #: [id number] Patient Name: JULIE-ANN, VANMAANEN Visit # : [id number]  MRN: [id number] Physician: Rodolph Romano DOB/Age [birth year] (Age: 72) Gender: F Collected Date: 04/24/2023 Received Date: 04/25/2023  FINAL DIAGNOSIS       1. Gallbladder,  :       CHRONIC CHOLECYSTITIS.      CHOLELITHIASIS.       DATE SIGNED OUT: 04/26/2023 ELECTRONIC SIGNATURE : Belvie Come, John, Pathologist, Electronic Signature  MICROSCOPIC DESCRIPTION  CASE COMMENTS STAINS USED IN DIAGNOSIS: H&E    CLINICAL HISTORY  SPECIMEN(S) OBTAINED 1. Gallbladder,  SPECIMEN COMMENTS: SPECIMEN CLINICAL INFORMATION: 1. Cholecystitis    Gross  Description 1. Size/?Intact: 9.4 x 4.3 cm; intact      Serosal surface: Gray-tan, smooth, and glistening with a moderate amount of       adipose tissue and a mildly roughened hepatic bed.      Mucosa/Wall: Green-yellow, velvety mucosa; 0.1-0.4 cm thick wall with mild edema      between the layers. Discrete lesions are not identified.      Contents: Moderate amount of tenacious, green bile admixed with a 5.0 x 3.5 x      0.3 cm aggregate of mucinous, yellow-green, opaque material. Two black,      multifaceted, friable stones are present, each up to 0.5 cm in greatest      dimension.      Cystic duct: 0.4 cm diameter, patent; received with a single white, plastic      clamp; inked black.      Block Summary: Representative sections including the cystic duct margin (en      face) are submitted in 1 block (1A).      AMG 04/25/2023        Report signed out from the following location(s) [place] [address] [place], [place] [address] CLIA #: [id number]  [facility] [address] [place], [place] [address] CLIA #: [id number]   Urinalysis, Routine w reflex microscopic -Urine, Clean Catch     Status: Abnormal   Collection Time: 04/24/23  1:40 AM  Result Value Ref Range   Color, Urine YELLOW (A) YELLOW   APPearance HAZY (A) CLEAR   Specific Gravity, Urine 1.016 1.005 - 1.030   pH 5.0 5.0 - 8.0   Glucose, UA NEGATIVE NEGATIVE mg/dL   Hgb urine dipstick NEGATIVE NEGATIVE   Bilirubin Urine NEGATIVE NEGATIVE   Ketones, ur 5 (A) NEGATIVE mg/dL   Protein, ur NEGATIVE NEGATIVE mg/dL   Nitrite NEGATIVE NEGATIVE   Leukocytes,Ua NEGATIVE NEGATIVE    Comment: Performed at [facility], [address] [place], [place] [address]  CBG monitoring, ED     Status: Abnormal   Collection Time: 04/24/23  4:07 AM  Result Value Ref Range   Glucose-Capillary 164 (H) 70 - 99 mg/dL    Comment: Glucose reference range applies only to samples taken  after fasting for at least 8 hours.  CBG monitoring, ED     Status: Abnormal   Collection Time: 04/24/23  7:14 AM  Result Value  Ref Range   Glucose-Capillary 107 (H) 70 - 99 mg/dL    Comment: Glucose reference range applies only to samples taken after fasting for at least 8 hours.  CBG monitoring, ED     Status: Abnormal   Collection Time: 04/24/23  8:51 AM  Result Value Ref Range   Glucose-Capillary 100 (H) 70 - 99 mg/dL    Comment: Glucose reference range applies only to samples taken after fasting for at least 8 hours.  CBG monitoring, ED     Status: Abnormal   Collection Time: 04/24/23 12:55 PM  Result Value Ref Range   Glucose-Capillary 107 (H) 70 - 99 mg/dL    Comment: Glucose reference range applies only to samples taken after fasting for at least 8 hours.  CBG monitoring, ED     Status: Abnormal   Collection Time: 04/24/23  5:40 PM  Result Value Ref Range   Glucose-Capillary 153 (H) 70 - 99 mg/dL    Comment: Glucose reference range applies only to samples taken after fasting for at least 8 hours.  Glucose, capillary     Status: Abnormal   Collection Time: 04/24/23  8:00 PM  Result Value Ref Range   Glucose-Capillary 229 (H) 70 - 99 mg/dL    Comment: Glucose reference range applies only to samples taken after fasting for at least 8 hours.   Comment 1 Notify RN    Comment 2 Document in Chart   Glucose, capillary     Status: Abnormal   Collection Time: 04/24/23 10:54 PM  Result Value Ref Range   Glucose-Capillary 225 (H) 70 - 99 mg/dL    Comment: Glucose reference range applies only to samples taken after fasting for at least 8 hours.  Glucose, capillary     Status: Abnormal   Collection Time: 04/25/23  3:19 AM  Result Value Ref Range   Glucose-Capillary 197 (H) 70 - 99 mg/dL    Comment: Glucose reference range applies only to samples taken after fasting for at least 8 hours.   Comment 1 Notify RN    Comment 2 Document in Chart   Glucose, capillary     Status: Abnormal    Collection Time: 04/25/23  7:53 AM  Result Value Ref Range   Glucose-Capillary 113 (H) 70 - 99 mg/dL    Comment: Glucose reference range applies only to samples taken after fasting for at least 8 hours.  POCT Urinalysis Dipstick (18997)     Status: Normal   Collection Time: 05/29/23 10:56 AM  Result Value Ref Range   Color, UA     Clarity, UA     Glucose, UA Negative Negative   Bilirubin, UA Negative    Ketones, UA Negative    Spec Grav, UA 1.015 1.010 - 1.025   Blood, UA Negative    pH, UA 6.5 5.0 - 8.0   Protein, UA Negative Negative   Urobilinogen, UA 0.2 0.2 or 1.0 E.U./dL   Nitrite, UA Negative    Leukocytes, UA Negative Negative   Appearance     Odor    POCT UA - Microalbumin     Status: Normal   Collection Time: 05/29/23 11:56 AM  Result Value Ref Range   Microalbumin Ur, POC 10 mg/L   Creatinine, POC 50 mg/dL   Albumin/Creatinine Ratio, Urine, POC <30   IGP, Aptima HPV, rfx 16/18,45     Status: None   Collection Time: 05/29/23 12:23 PM  Result Value Ref Range   DIAGNOSIS: Comment  Comment: NEGATIVE FOR INTRAEPITHELIAL LESION OR MALIGNANCY.   Specimen adequacy: Comment     Comment: Satisfactory for evaluation. Endocervical and/or squamous metaplastic cells (endocervical component) are present.    Clinician Provided ICD10 Comment     Comment: Z11.2 Z11.51 N76.0    Performed by: Comment     Comment: Hildegard Leaven, Cytologist (ASCP)   PAP Smear Comment .    Note: Comment     Comment: The Pap smear is a screening test designed to aid in the detection of premalignant and malignant conditions of the uterine cervix.  It is not a diagnostic procedure and should not be used as the sole means of detecting cervical cancer.  Both false-positive and false-negative reports do occur.    Test Methodology Comment     Comment: This liquid based ThinPrep(R) pap test was interpreted using the Hologic(R) Genius(TM) Cervical AI Algorithm whole slide imaging system.    HPV  Aptima Negative Negative    Comment: This nucleic acid amplification test detects fourteen high-risk HPV types (16,18,31,33,35,39,45,51,52,56,58,59,66,68) without differentiation.    HPV Genotype Reflex Comment     Comment: Criteria not met, HPV Genotype not performed.  Specimen status report     Status: None   Collection Time: 05/29/23 12:23 PM  Result Value Ref Range   specimen status report Comment     Comment: One Specimen Identifier One Specimen Identifier The specimen received included only one patient identifier on the primary collection container.  Our laboratory accrediting agency states All primary specimen containers must be labeled with 2 identifiers at the time of collection.     Radiology: US  ABDOMEN LIMITED RUQ (LIVER/GB) Result Date: 04/24/2023 CLINICAL DATA:  Upper abdominal pain EXAM: ULTRASOUND ABDOMEN LIMITED RIGHT UPPER QUADRANT COMPARISON:  02/28/2023 FINDINGS: Gallbladder: The gallbladder is distended. There is layering sludge and small stones identified within the gallbladder lumen. The gallbladder wall is thickened, new since prior examination, measuring up to 4 mm in thickness. No pericholecystic fluid is identified. The sonographic Beverley sign is reportedly negative. Common bile duct: Diameter: 5 mm in proximal diameter Liver: No focal lesion identified. Within normal limits in parenchymal echogenicity. Portal vein is patent on color Doppler imaging with normal direction of blood flow towards the liver. Other: None. IMPRESSION: 1. Cholelithiasis and gallbladder sludge with new gallbladder wall thickening. These findings may reflect early changes of acute cholecystitis correlation with liver enzymes is recommended. If indicated, hepatobiliary scintigraphy may be helpful to assess patency of the cystic duct. Electronically Signed   By: Dorethia Molt M.D.   On: 04/24/2023 03:04    No results found.  No results found.    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/12/2023   OSA on CPAP 01/15/2023   Iron deficiency 01/15/2023   Snoring 10/02/2022   Gastroesophageal reflux disease 07/11/2022   Acid indigestion 07/11/2022   Anterior knee pain 02/10/2022   Bursitis of hip 02/10/2022   Lipoma of foot 02/10/2022   Acute bacterial sinusitis 11/18/2021   Impingement syndrome of left shoulder region 02/25/2021   Premature ventricular contractions 08/09/2020   Heart palpitations 07/06/2020   Pure hypercholesterolemia 07/06/2020   Vaginal atrophy 08/16/2018   Rectocele 08/16/2018   Osteoarthritis of knee 10/10/2017   Scoliosis of lumbar spine 07/06/2016   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 OSA (obstructive sleep apnea) (Primary) The patient does tolerate PAP and reports  benefit from PAP use. The patient was reminded how to clean equipment and advised to replace supplies routinely. The patient was also counselled on weight loss. The compliance is good. The AHI is 0.8   OSA on cpap- controlled. Continue with excellent compliance with pap. CPAP continues to be medically necessary to treat this patient's OSA.  F/u one year.     2. CPAP use counseling CPAP Counseling: had a lengthy discussion with the patient regarding the importance of PAP therapy in management of the sleep apnea. Patient appears to understand the risk factor reduction and also understands the risks associated with untreated sleep apnea. Patient will try to make a good faith effort to remain compliant with therapy. Also instructed the patient on proper cleaning of the device including the water must be changed daily if possible and use of distilled water is preferred. Patient understands that the machine should be regularly cleaned with appropriate recommended cleaning solutions that do not damage the PAP machine for example given white vinegar and water rinses. Other methods such as ozone treatment may not be as good as these simple methods to achieve cleaning.      General Counseling: I have discussed the findings of the evaluation and examination with [facility].  I have also discussed any further diagnostic evaluation thatmay be needed or ordered today. Heatherly verbalizes understanding of the findings of todays visit. We also reviewed her medications today and discussed drug interactions and side effects including but not limited excessive drowsiness and altered mental states. We also discussed that there is always a risk not just to her but also people around her. she has been encouraged to call the office with any questions or concerns that should arise related to todays visit.  No orders of the defined types were placed in this encounter.       I have personally obtained a history, examined the patient, evaluated laboratory and imaging results, formulated the assessment and plan and placed orders. This patient was seen today by Lauraine Lay, PA-C in collaboration with Dr. Elfreda Bathe.   Elfreda DELENA Bathe, MD [facility] Diplomate ABMS Pulmonary Critical Care Medicine and Sleep Medicine

## 2023-07-16 ENCOUNTER — Ambulatory Visit (INDEPENDENT_AMBULATORY_CARE_PROVIDER_SITE_OTHER): Admitting: Internal Medicine

## 2023-07-16 VITALS — BP 122/69 | HR 69 | Resp 16 | Ht 60.0 in | Wt 139.0 lb

## 2023-07-16 DIAGNOSIS — Z7189 Other specified counseling: Secondary | ICD-10-CM | POA: Diagnosis not present

## 2023-07-16 DIAGNOSIS — G4733 Obstructive sleep apnea (adult) (pediatric): Secondary | ICD-10-CM

## 2023-07-16 NOTE — Patient Instructions (Signed)

## 2023-08-30 ENCOUNTER — Ambulatory Visit (INDEPENDENT_AMBULATORY_CARE_PROVIDER_SITE_OTHER): Admitting: Internal Medicine

## 2023-08-30 ENCOUNTER — Encounter: Payer: Self-pay | Admitting: Internal Medicine

## 2023-08-30 ENCOUNTER — Ambulatory Visit: Payer: Self-pay | Admitting: Internal Medicine

## 2023-08-30 VITALS — BP 108/64 | HR 83 | Ht 61.0 in | Wt 140.4 lb

## 2023-08-30 DIAGNOSIS — E039 Hypothyroidism, unspecified: Secondary | ICD-10-CM

## 2023-08-30 DIAGNOSIS — E1159 Type 2 diabetes mellitus with other circulatory complications: Secondary | ICD-10-CM

## 2023-08-30 DIAGNOSIS — E559 Vitamin D deficiency, unspecified: Secondary | ICD-10-CM | POA: Insufficient documentation

## 2023-08-30 DIAGNOSIS — Z13820 Encounter for screening for osteoporosis: Secondary | ICD-10-CM

## 2023-08-30 DIAGNOSIS — E119 Type 2 diabetes mellitus without complications: Secondary | ICD-10-CM

## 2023-08-30 DIAGNOSIS — Z780 Asymptomatic menopausal state: Secondary | ICD-10-CM

## 2023-08-30 DIAGNOSIS — N8189 Other female genital prolapse: Secondary | ICD-10-CM | POA: Insufficient documentation

## 2023-08-30 DIAGNOSIS — M5136 Other intervertebral disc degeneration, lumbar region with discogenic back pain only: Secondary | ICD-10-CM

## 2023-08-30 DIAGNOSIS — I152 Hypertension secondary to endocrine disorders: Secondary | ICD-10-CM

## 2023-08-30 DIAGNOSIS — M545 Low back pain, unspecified: Secondary | ICD-10-CM | POA: Insufficient documentation

## 2023-08-30 DIAGNOSIS — M8580 Other specified disorders of bone density and structure, unspecified site: Secondary | ICD-10-CM | POA: Insufficient documentation

## 2023-08-30 DIAGNOSIS — G8929 Other chronic pain: Secondary | ICD-10-CM

## 2023-08-30 LAB — POCT CBG (FASTING - GLUCOSE)-MANUAL ENTRY: Glucose Fasting, POC: 134 mg/dL — AB (ref 70–99)

## 2023-08-30 NOTE — Progress Notes (Signed)
 Established Patient Office Visit  Subjective:  Patient ID: Nicole Young, female    DOB: [birth year]  Age: 83 y.o. MRN: [id number]  Chief Complaint  Patient presents with   Follow-up    3 month follow up    Patient was seen today 3 month follow up. She reports her abdominal symptoms have resolved since having her gallbladder surgery. She was started on Ozempic by her Endocrinologist. She reports she started having hypotension and feeling faint and would check her blood pressure and it would be very low so she has stopped taking her losartan . She reports that her hypotension has resolved since she stopped taking the Losartan .   Patient aware she is due for her Dexa scan for screening for osteoporosis; she was osteopenia on her last scan in 2020. Patient is requesting physical therapy for her chronic lower back pain and for pelvic floor therapy due to intermittent fecal incontinence with loose stools. She denies any symptoms of headaches, chest pain, shortness of breath, nausea, vomiting, or diarrhea.     No other concerns at this time.   Past Medical History:  Diagnosis Date   Acute bacterial sinusitis 11/18/2021   Allergic rhinitis    AMD (acid maltase deficiency) (HCC)    Anterior knee pain 02/10/2022   Arthralgia of multiple joints 11/12/2014   Biliary dyskinesia 2025   Bursitis of hip 02/10/2022   Carpal tunnel syndrome 06/30/2014   Cervical arthritis 07/06/2016   Cholelithiasis 2025   Complication of anesthesia    slow to wake up in past   Depression    controlled   Diabetes mellitus without complication (HCC)    controlled   GERD (gastroesophageal reflux disease)    Heart palpitations 07/06/2020   Hyperlipidemia    Hypertension    Hypothyroidism    Impingement syndrome of left shoulder 02/25/2021   Iron deficiency 01/15/2023   Lipoma of foot 02/10/2022   Neuropathy    Osteoarthritis    knee, ankles, shoulders, back, neck, hands/fingers, wrists;     Osteoarthritis of knee 10/10/2017   Premature ventricular contractions 08/09/2020   Pure hypercholesterolemia 07/06/2020   Rectocele    Sleep apnea    Thyroid  disease    Vaginal atrophy 08/16/2018   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[facility])     Past Surgical History:  Procedure Laterality Date   AUGMENTATION MAMMAPLASTY  1975   AUGMENTATION MAMMAPLASTY  2017   2nd augmentation and lift   BIOPSY  07/11/2022   Procedure: BIOPSY;  Surgeon: Jinny Carmine, MD;  Location: [facility] ENDOSCOPY;  Service: Endoscopy;;   BREAST SURGERY     impants replaced with lift   CARPAL TUNNEL RELEASE Right 08/26/2014   Procedure: CARPAL TUNNEL RELEASE;  Surgeon: Kayla Pinal, MD;  Location: [facility] ORS;  Service: Orthopedics;  Laterality: Right;   COLONOSCOPY  2005   Dr Dessa   COLONOSCOPY N/A 06/24/2014   Procedure: COLONOSCOPY;  Surgeon: Reyes LELON Dessa, MD;  Location: [facility] ENDOSCOPY;  Service: Endoscopy;  Laterality: N/A;   COLONOSCOPY N/A 04/11/2023   Procedure: COLONOSCOPY;  Surgeon: Therisa Bi, MD;  Location: [facility] ENDOSCOPY;  Service: Gastroenterology;  Laterality: N/A;  please schedule in am, she is diabetic   ESOPHAGOGASTRODUODENOSCOPY (EGD) WITH PROPOFOL  N/A 07/11/2022   Procedure: ESOPHAGOGASTRODUODENOSCOPY (EGD) WITH PROPOFOL ;  Surgeon: Jinny Carmine, MD;  Location: [facility] ENDOSCOPY;  Service: Endoscopy;  Laterality: N/A;   PARATHYROIDECTOMY  1992   PLACEMENT OF BREAST IMPLANTS  1975   POLYPECTOMY  04/11/2023   Procedure: POLYPECTOMY;  Surgeon:  Therisa Bi, MD;  Location: [facility] ENDOSCOPY;  Service: Gastroenterology;;   MARINE, ADENOIDECTOMY, BILATERAL MYRINGOTOMY AND TUBES     UMBILICAL HERNIA REPAIR  04/24/2023   Procedure: REPAIR, HERNIA, UMBILICAL, ADULT;  Surgeon: Rodolph Romano, MD;  Location: [facility] ORS;  Service: General;;    Social History   Socioeconomic History   Marital status: Widowed    Spouse name: Not on file   Number of children: 3   Years of education: Not on file   Highest  education level: Bachelor's degree (e.g., BA, AB, BS)  Occupational History   Occupation: retired  Tobacco Use   Smoking status: Never   Smokeless tobacco: Never  Vaping Use   Vaping status: Never Used  Substance and Sexual Activity   Alcohol  use: Yes    Alcohol /week: 1.0 standard drink of alcohol     Types: 1 Glasses of wine per week   Drug use: No   Sexual activity: Not on file  Other Topics Concern   Not on file  Social History Narrative   Not on file   Social Drivers of Health   Financial Resource Strain: Low Risk  (07/19/2022)   Received from [facility]   Overall Financial Resource Strain (CARDIA)    Difficulty of Paying Living Expenses: Not hard at all  Food Insecurity: No Food Insecurity (04/24/2023)   Hunger Vital Sign    Worried About Running Out of Food in the Last Year: Never true    Ran Out of Food in the Last Year: Never true  Transportation Needs: No Transportation Needs (04/24/2023)   PRAPARE - Administrator, Civil Service (Medical): No    Lack of Transportation (Non-Medical): No  Physical Activity: Insufficiently Active (05/29/2023)   Exercise Vital Sign    Days of Exercise per Week: 3 days    Minutes of Exercise per Session: 30 min  Stress: No Stress Concern Present (02/22/2021)   [employer] of Occupational Health - Occupational Stress Questionnaire    Feeling of Stress : Not at all  Social Connections: Socially Isolated (04/24/2023)   Social Connection and Isolation Panel    Frequency of Communication with Friends and Family: More than three times a week    Frequency of Social Gatherings with Friends and Family: More than three times a week    Attends Religious Services: Never    Database administrator or Organizations: No    Attends Banker Meetings: Never    Marital Status: Widowed  Intimate Partner Violence: Not At Risk (04/24/2023)   Humiliation, Afraid, Rape, and Kick questionnaire    Fear of Current  or Ex-Partner: No    Emotionally Abused: No    Physically Abused: No    Sexually Abused: No    Family History  Problem Relation Age of Onset   Cancer Father        prostate   Prostate cancer Father    Hypertension Mother    Heart attack Mother    Heart attack Maternal Grandfather    Diabetes Paternal Uncle    Congestive Heart Failure Maternal Grandmother    CVA Paternal Grandmother    Prostate cancer Paternal Grandfather    Breast cancer Maternal Aunt     Allergies  Allergen Reactions   Clarithromycin  Other (See Comments)    Dizziness   Neosporin [Neomycin-Bacitracin Zn-Polymyx] Swelling   Sulfa Antibiotics Other (See Comments)    Hallucinations as a child   Thimerosal (Thiomersal) Swelling    redness  Zostavax  [Zoster Vaccine Live] Rash    Do Not Administer Zostavax to Pt!    Outpatient Medications Prior to Visit  Medication Sig   Accu-Chek FastClix Lancets MISC CHECK SUGAR ONCE DAILY. DX: E11.9   ACCU-CHEK SMARTVIEW test strip USE TO CHECK BLOOD SUGAR ONCE A DAY   ascorbic acid (VITAMIN C) 500 MG tablet Take 500 mg by mouth daily.   Blood Glucose Monitoring Suppl (ACCU-CHEK GUIDE ME) w/Device KIT    Cholecalciferol (VITAMIN D) 50 MCG (2000 UT) tablet Take 2,000 Units by mouth daily.   diazepam  (VALIUM ) 5 MG tablet Take 2.5 mg by mouth daily as needed for anxiety.   levothyroxine  (SYNTHROID ) 50 MCG tablet Take 1 tablet (50 mcg total) by mouth daily before breakfast.   Magnesium 200 MG TABS Take 200 mg by mouth 4 (four) times a week.   metFORMIN  (GLUCOPHAGE -XR) 500 MG 24 hr tablet TAKE 1 TABLET TWICE A DAY (Patient taking differently: Take 500 mg by mouth daily with supper.)   methocarbamol (ROBAXIN) 500 MG tablet Take 500 mg by mouth as needed for muscle spasms.   MIEBO 1.338 GM/ML SOLN Apply 1 drop to eye 4 (four) times daily.   Multiple Vitamins-Minerals (PRESERVISION AREDS PO) Take 1 tablet by mouth daily.   OZEMPIC, 0.25 OR 0.5 MG/DOSE, 2 MG/3ML SOPN  (Patient  taking differently: Inject 0.5 mg into the skin once a week.)   rosuvastatin  (CRESTOR ) 20 MG tablet TAKE 1 TABLET DAILY   sertraline  (ZOLOFT ) 100 MG tablet TAKE 1 TABLET DAILY (Patient taking differently: Take 50 mg by mouth daily.)   famotidine  (PEPCID ) 20 MG tablet Take 20 mg by mouth daily as needed for heartburn or indigestion. (Patient not taking: Reported on 08/30/2023)   losartan  (COZAAR ) 25 MG tablet Take 1 tablet (25 mg total) by mouth at bedtime. (Patient not taking: Reported on 08/30/2023)   No facility-administered medications prior to visit.    Review of Systems  Constitutional: Negative.   HENT: Negative.    Eyes: Negative.   Respiratory: Negative.  Negative for cough and shortness of breath.   Cardiovascular: Negative.  Negative for chest pain, palpitations and leg swelling.  Gastrointestinal:  Negative for abdominal pain, constipation, diarrhea, heartburn, nausea and vomiting.       Reports intermittent fecal incontinence when her stools are loose.  Genitourinary: Negative.  Negative for dysuria and flank pain.  Musculoskeletal:  Positive for back pain. Negative for joint pain and myalgias.  Skin: Negative.   Neurological: Negative.  Negative for dizziness and headaches.  Endo/Heme/Allergies: Negative.   Psychiatric/Behavioral: Negative.  Negative for depression and suicidal ideas. The patient is not nervous/anxious.        Objective:   BP 108/64   Pulse 83   Ht 5' 1 (1.549 m)   Wt 140 lb 6.4 oz (63.7 kg)   SpO2 96%   BMI 26.53 kg/m   Vitals:   08/30/23 0957  BP: 108/64  Pulse: 83  Height: 5' 1 (1.549 m)  Weight: 140 lb 6.4 oz (63.7 kg)  SpO2: 96%  BMI (Calculated): 26.54    Physical Exam Vitals and nursing note reviewed.  Constitutional:      Appearance: Normal appearance.  HENT:     Head: Normocephalic and atraumatic.     Nose: Nose normal.     Mouth/Throat:     Mouth: Mucous membranes are moist.     Pharynx: Oropharynx is clear.  Eyes:      Conjunctiva/sclera: Conjunctivae normal.  Pupils: Pupils are equal, round, and reactive to light.  Cardiovascular:     Rate and Rhythm: Normal rate and regular rhythm.     Pulses: Normal pulses.     Heart sounds: Normal heart sounds. No murmur heard. Pulmonary:     Effort: Pulmonary effort is normal.     Breath sounds: Normal breath sounds. No wheezing.  Abdominal:     General: Bowel sounds are normal.     Palpations: Abdomen is soft.     Tenderness: There is no abdominal tenderness. There is no right CVA tenderness or left CVA tenderness.  Musculoskeletal:        General: Normal range of motion.     Cervical back: Normal range of motion.     Lumbar back: Tenderness present. Scoliosis present.     Right lower leg: No edema.     Left lower leg: No edema.  Skin:    General: Skin is warm and dry.  Neurological:     General: No focal deficit present.     Mental Status: She is alert and oriented to person, place, and time.  Psychiatric:        Mood and Affect: Mood normal.        Behavior: Behavior normal.      Results for orders placed or performed in visit on 08/30/23  POCT CBG (Fasting - Glucose)  Result Value Ref Range   Glucose Fasting, POC 134 (A) 70 - 99 mg/dL    Recent Results (from the past 2160 hours)  POCT CBG (Fasting - Glucose)     Status: Abnormal   Collection Time: 08/30/23 10:03 AM  Result Value Ref Range   Glucose Fasting, POC 134 (A) 70 - 99 mg/dL      Assessment & Plan:  Continue taking medications as prescribed. Will order routine labs today. Encouraged patient to follow strict diabetic diet and exercise as tolerated. Will provide patient with referrals for physical therapy as requested.Will scheduled Dexa scan and follow up with results. Problem List Items Addressed This Visit     Diabetes mellitus (HCC) - Primary   Relevant Orders   POCT CBG (Fasting - Glucose) (Completed)   Hypertension associated with diabetes (HCC)   Relevant Orders   CBC  with Diff   CMP14+EGFR   Lipid Panel w/o Chol/HDL Ratio   Adult hypothyroidism   Relevant Orders   TSH+T4F+T3Free   Degeneration of lumbar intervertebral disc   Relevant Orders   Ambulatory referral to Physical Therapy   Vitamin D deficiency   Relevant Orders   Vitamin D (25 hydroxy)   Chronic low back pain without sciatica   Relevant Orders   Ambulatory referral to Physical Therapy   Pelvic floor 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   Total time spent: 25 minutes  FERNAND FREDY RAMAN, MD  08/30/2023   This document may have been prepared by [facility] Voice Recognition software and as such may include unintentional dictation errors.

## 2023-09-01 LAB — CMP14+EGFR
ALT: 35 IU/L — ABNORMAL HIGH (ref 0–32)
AST: 25 IU/L (ref 0–40)
Albumin: 4.6 g/dL (ref 3.7–4.7)
Alkaline Phosphatase: 47 IU/L (ref 44–121)
BUN/Creatinine Ratio: 28 (ref 12–28)
BUN: 20 mg/dL (ref 8–27)
Bilirubin Total: 0.3 mg/dL (ref 0.0–1.2)
CO2: 23 mmol/L (ref 20–29)
Calcium: 9.3 mg/dL (ref 8.7–10.3)
Chloride: 101 mmol/L (ref 96–106)
Creatinine, Ser: 0.72 mg/dL (ref 0.57–1.00)
Globulin, Total: 1.9 g/dL (ref 1.5–4.5)
Glucose: 100 mg/dL — ABNORMAL HIGH (ref 70–99)
Potassium: 4.6 mmol/L (ref 3.5–5.2)
Sodium: 138 mmol/L (ref 134–144)
Total Protein: 6.5 g/dL (ref 6.0–8.5)
eGFR: 82 mL/min/1.73 (ref >59)

## 2023-09-01 LAB — TSH+T4F+T3FREE
Free T4: 1.21 ng/dL (ref 0.82–1.77)
T3, Free: 2.4 pg/mL (ref 2.0–4.4)
TSH: 4.11 u[IU]/mL (ref 0.450–4.500)

## 2023-09-01 LAB — LIPID PANEL W/O CHOL/HDL RATIO
Cholesterol, Total: 112 mg/dL (ref 100–199)
HDL: 50 mg/dL (ref >39)
LDL Chol Calc (NIH): 44 mg/dL (ref 0–99)
Triglycerides: 94 mg/dL (ref 0–149)
VLDL Cholesterol Cal: 18 mg/dL (ref 5–40)

## 2023-09-01 LAB — CBC WITH DIFFERENTIAL/PLATELET
Basophils Absolute: 0.1 x10E3/uL (ref 0.0–0.2)
Basos: 1 %
EOS (ABSOLUTE): 0.2 x10E3/uL (ref 0.0–0.4)
Eos: 3 %
Hematocrit: 39.4 % (ref 34.0–46.6)
Hemoglobin: 12.7 g/dL (ref 11.1–15.9)
Immature Grans (Abs): 0 x10E3/uL (ref 0.0–0.1)
Immature Granulocytes: 0 %
Lymphocytes Absolute: 2.2 x10E3/uL (ref 0.7–3.1)
Lymphs: 26 %
MCH: 29.8 pg (ref 26.6–33.0)
MCHC: 32.2 g/dL (ref 31.5–35.7)
MCV: 93 fL (ref 79–97)
Monocytes Absolute: 0.8 x10E3/uL (ref 0.1–0.9)
Monocytes: 9 %
Neutrophils Absolute: 5.1 x10E3/uL (ref 1.4–7.0)
Neutrophils: 61 %
Platelets: 217 x10E3/uL (ref 150–450)
RBC: 4.26 x10E6/uL (ref 3.77–5.28)
RDW: 13.6 % (ref 11.7–15.4)
WBC: 8.4 x10E3/uL (ref 3.4–10.8)

## 2023-09-01 LAB — VITAMIN D 25 HYDROXY (VIT D DEFICIENCY, FRACTURES): Vit D, 25-Hydroxy: 59.3 ng/mL (ref 30.0–100.0)

## 2023-09-03 NOTE — Progress Notes (Signed)
 Patient notified

## 2023-09-07 ENCOUNTER — Other Ambulatory Visit: Payer: Self-pay

## 2023-09-07 DIAGNOSIS — E782 Mixed hyperlipidemia: Secondary | ICD-10-CM

## 2023-09-07 DIAGNOSIS — E039 Hypothyroidism, unspecified: Secondary | ICD-10-CM

## 2023-09-07 MED ORDER — ROSUVASTATIN CALCIUM 20 MG PO TABS: 20.0000 mg | ORAL_TABLET | Freq: Every day | ORAL | 3 refills | Status: AC

## 2023-09-07 MED ORDER — LEVOTHYROXINE SODIUM 50 MCG PO TABS: 50.0000 ug | ORAL_TABLET | Freq: Every day | ORAL | 3 refills | Status: AC

## 2023-09-13 ENCOUNTER — Telehealth: Payer: Self-pay

## 2023-09-13 NOTE — Telephone Encounter (Signed)
 Patient called about her rosuvastatin  rx needing to be called into [facility] [address],   This was already sent to them on 8/29, patient just needs to be called and told this.

## 2023-09-13 NOTE — Telephone Encounter (Signed)
 Patient informed she will call to get it ready for pick up

## 2023-09-17 ENCOUNTER — Telehealth: Payer: Self-pay | Admitting: Internal Medicine

## 2023-09-17 ENCOUNTER — Other Ambulatory Visit: Payer: Self-pay | Admitting: Internal Medicine

## 2023-09-17 DIAGNOSIS — F411 Generalized anxiety disorder: Secondary | ICD-10-CM

## 2023-09-17 MED ORDER — SERTRALINE HCL 25 MG PO TABS: 25.0000 mg | ORAL_TABLET | Freq: Every day | ORAL | 0 refills | Status: DC

## 2023-09-17 NOTE — Telephone Encounter (Signed)
 Patient left VM wanting instructions on how to wean off of her sertraline , states she does not want to take it anymore. Please advise.

## 2023-10-21 ENCOUNTER — Other Ambulatory Visit: Payer: Self-pay

## 2023-10-21 ENCOUNTER — Emergency Department
Admission: EM | Admit: 2023-10-21 | Discharge: 2023-10-21 | Disposition: A | Discharge status: Home / Self Care | Attending: Emergency Medicine | Admitting: Emergency Medicine

## 2023-10-21 DIAGNOSIS — R112 Nausea with vomiting, unspecified: Secondary | ICD-10-CM | POA: Diagnosis present

## 2023-10-21 DIAGNOSIS — E1140 Type 2 diabetes mellitus with diabetic neuropathy, unspecified: Secondary | ICD-10-CM | POA: Insufficient documentation

## 2023-10-21 DIAGNOSIS — I10 Essential (primary) hypertension: Secondary | ICD-10-CM | POA: Insufficient documentation

## 2023-10-21 DIAGNOSIS — E039 Hypothyroidism, unspecified: Secondary | ICD-10-CM | POA: Insufficient documentation

## 2023-10-21 DIAGNOSIS — E119 Type 2 diabetes mellitus without complications: Secondary | ICD-10-CM

## 2023-10-21 LAB — CBC WITH DIFFERENTIAL/PLATELET
Abs Immature Granulocytes: 0.02 K/uL (ref 0.00–0.07)
Basophils Absolute: 0 K/uL (ref 0.0–0.1)
Basophils Relative: 0 %
Eosinophils Absolute: 0 K/uL (ref 0.0–0.5)
Eosinophils Relative: 0 %
HCT: 39.6 % (ref 36.0–46.0)
Hemoglobin: 13 g/dL (ref 12.0–15.0)
Immature Granulocytes: 0 %
Lymphocytes Relative: 5 %
Lymphs Abs: 0.4 K/uL — ABNORMAL LOW (ref 0.7–4.0)
MCH: 29.5 pg (ref 26.0–34.0)
MCHC: 32.8 g/dL (ref 30.0–36.0)
MCV: 89.8 fL (ref 80.0–100.0)
Monocytes Absolute: 0.3 K/uL (ref 0.1–1.0)
Monocytes Relative: 4 %
Neutro Abs: 7.1 K/uL (ref 1.7–7.7)
Neutrophils Relative %: 91 %
Platelets: 167 K/uL (ref 150–400)
RBC: 4.41 MIL/uL (ref 3.87–5.11)
RDW: 14.5 % (ref 11.5–15.5)
WBC: 7.9 K/uL (ref 4.0–10.5)
nRBC: 0 % (ref 0.0–0.2)

## 2023-10-21 LAB — COMPREHENSIVE METABOLIC PANEL WITH GFR
ALT: 22 U/L (ref 0–44)
AST: 31 U/L (ref 15–41)
Albumin: 3.7 g/dL (ref 3.5–5.0)
Alkaline Phosphatase: 37 U/L — ABNORMAL LOW (ref 38–126)
Anion gap: 8 (ref 5–15)
BUN: 22 mg/dL (ref 8–23)
CO2: 21 mmol/L — ABNORMAL LOW (ref 22–32)
Calcium: 8.1 mg/dL — ABNORMAL LOW (ref 8.9–10.3)
Chloride: 106 mmol/L (ref 98–111)
Creatinine, Ser: 0.7 mg/dL (ref 0.44–1.00)
GFR, Estimated: >60 mL/min (ref >60)
Glucose, Bld: 126 mg/dL — ABNORMAL HIGH (ref 70–99)
Potassium: 4.1 mmol/L (ref 3.5–5.1)
Sodium: 135 mmol/L (ref 135–145)
Total Bilirubin: 0.8 mg/dL (ref 0.0–1.2)
Total Protein: 6.3 g/dL — ABNORMAL LOW (ref 6.5–8.1)

## 2023-10-21 LAB — LIPASE, BLOOD: Lipase: 32 U/L (ref 11–51)

## 2023-10-21 MED ORDER — SODIUM CHLORIDE 0.9 % IV BOLUS
1000.0000 mL | Freq: Once | INTRAVENOUS | Status: AC
  Administered 2023-10-21: 1000 mL via INTRAVENOUS

## 2023-10-21 MED ORDER — METOCLOPRAMIDE HCL 10 MG PO TABS: 10.0000 mg | ORAL_TABLET | Freq: Four times a day (QID) | ORAL | 0 refills | Status: DC | PRN

## 2023-10-21 MED ORDER — PANTOPRAZOLE SODIUM 40 MG IV SOLR
40.0000 mg | Freq: Once | INTRAVENOUS | Status: AC
Start: 2023-10-21 — End: 2023-10-21
  Administered 2023-10-21: 40 mg via INTRAVENOUS
  Filled 2023-10-21: qty 10

## 2023-10-21 MED ORDER — KETOROLAC TROMETHAMINE 15 MG/ML IJ SOLN
15.0000 mg | Freq: Once | INTRAMUSCULAR | Status: AC
  Administered 2023-10-21: 15 mg via INTRAVENOUS
  Filled 2023-10-21: qty 1

## 2023-10-21 MED ORDER — METOCLOPRAMIDE HCL 5 MG/ML IJ SOLN
10.0000 mg | INTRAMUSCULAR | Status: AC
  Administered 2023-10-21: 10 mg via INTRAVENOUS
  Filled 2023-10-21: qty 2

## 2023-10-21 NOTE — ED Provider Notes (Signed)
 [facility] Provider Note    Event Date/Time   First MD Initiated Contact with Patient 10/21/23 2033     (approximate)   History   Chief Complaint: Nausea   HPI  Nicole Young is a 83 y.o. female with a tree of hypertension, diabetes who complains of upper abdominal discomfort, nausea, poor oral intake for last 2 days since her most recent Ozempic shot.  Prior to that she was in her usual state of health.  Denies any fever, no chest pain or shortness of breath.  Has felt increasingly fatigued during this time.        Past Medical History:  Diagnosis Date   Acute bacterial sinusitis 11/18/2021   Allergic rhinitis    AMD (acid maltase deficiency) (HCC)    Anterior knee pain 02/10/2022   Arthralgia of multiple joints 11/12/2014   Biliary dyskinesia 2025   Bursitis of hip 02/10/2022   Carpal tunnel syndrome 06/30/2014   Cervical arthritis 07/06/2016   Cholelithiasis 2025   Complication of anesthesia    slow to wake up in past   Depression    controlled   Diabetes mellitus without complication (HCC)    controlled   GERD (gastroesophageal reflux disease)    Heart palpitations 07/06/2020   Hyperlipidemia    Hypertension    Hypothyroidism    Impingement syndrome of left shoulder 02/25/2021   Iron deficiency 01/15/2023   Lipoma of foot 02/10/2022   Neuropathy    Osteoarthritis    knee, ankles, shoulders, back, neck, hands/fingers, wrists;    Osteoarthritis of knee 10/10/2017   Premature ventricular contractions 08/09/2020   Pure hypercholesterolemia 07/06/2020   Rectocele    Sleep apnea    Thyroid  disease    Vaginal atrophy 08/16/2018   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[facility])     Current Outpatient Rx   Order #: [id number] Class: Normal   Order #: [id number] Class: Normal   Order #: [id number] Class: Normal   Order #: [id number] Class: Historical Med   Order #: [id number] Class: Historical Med   Order #: [id number] Class: Historical Med    Order #: [id number] Class: Historical Med   Order #: [id number] Class: Historical Med   Order #: [id number] Class: Normal   Order #: [id number] Class: Normal   Order #: [id number] Class: Historical Med   Order #: [id number] Class: Normal   Order #: [id number] Class: Historical Med   Order #: [id number] Class: Historical Med   Order #: [id number] Class: Historical Med   Order #: [id number] Class: Historical Med   Order #: [id number] Class: Normal   Order #: [id number] Class: Normal    Past Surgical History:  Procedure Laterality Date   AUGMENTATION MAMMAPLASTY  1975   AUGMENTATION MAMMAPLASTY  2017   2nd augmentation and lift   BIOPSY  07/11/2022   Procedure: BIOPSY;  Surgeon: Jinny Carmine, MD;  Location: [facility] ENDOSCOPY;  Service: Endoscopy;;   BREAST SURGERY     impants replaced with lift   CARPAL TUNNEL RELEASE Right 08/26/2014   Procedure: CARPAL TUNNEL RELEASE;  Surgeon: Kayla Pinal, MD;  Location: [facility] ORS;  Service: Orthopedics;  Laterality: Right;   COLONOSCOPY  2005   Dr Dessa   COLONOSCOPY N/A 06/24/2014   Procedure: COLONOSCOPY;  Surgeon: Reyes LELON Dessa, MD;  Location[facility] END[facility]COPY;  Service: Endoscopy;  Laterality: N/A;   COLONOSCOPY N/A 04/11/2023   Procedure: COLONOSCOPY;  Surgeon: Therisa Bi, MD;  Location: [facility] ENDOSCOPY;  Service: Gastroenterology;  Laterality: N/A;  please schedule in am, she is diabetic   ESOPHAGOGASTRODUODENOSCOPY (EGD) WITH PROPOFOL  N/A 07/11/2022  Procedure: ESOPHAGOGASTRODUODENOSCOPY (EGD) WITH PROPOFOL ;  Surgeon: Jinny Carmine, MD;  Location: [facility] ENDOSCOPY;  Service: Endoscopy;  Laterality: N/A;   PARATHYROIDECTOMY  1992   PLACEMENT OF BREAST IMPLANTS  1975   POLYPECTOMY  04/11/2023   Procedure: POLYPECTOMY;  Surgeon: Therisa Bi, MD;  Location: [facility] ENDOSCOPY;  Service: Gastroenterology;;   MARINE, ADENOIDECTOMY, BILATERAL MYRINGOTOMY AND TUBES     UMBILICAL HERNIA REPAIR  04/24/2023   Procedure: REPAIR, HERNIA, UMBILICAL, ADULT;  Surgeon: Rodolph Romano, MD;  Location: [facility] ORS;  Service: General;;    Physical Exam   Triage Vital Signs: ED Triage Vitals  Encounter Vitals Group     BP 10/21/23 2035 135/61     Girls Systolic BP Percentile --      Girls Diastolic BP Percentile --      Boys Systolic BP Percentile --      Boys Diastolic BP Percentile --      Pulse Rate 10/21/23 2035 99     Resp --      Temp 10/21/23 2035 99 F (37.2 C)     Temp Source 10/21/23 2035 Oral     SpO2 10/21/23 2035 96 %     Weight 10/21/23 2037 134 lb (60.8 kg)     Height 10/21/23 2037 5' (1.524 m)     Head Circumference --      Peak Flow --      Pain Score 10/21/23 2037 0     Pain Loc --      Pain Education --      Exclude from Growth Chart --     Most recent vital signs: Vitals:   10/21/23 2035  BP: 135/61  Pulse: 99  Temp: 99 F (37.2 C)  SpO2: 96%    General: Awake, no distress.  CV:  Good peripheral perfusion.  Regular rate rhythm Resp:  Normal effort.  Clear lungs Abd:  No distention.  Soft with left upper quadrant tenderness.  No tympany to percussion Other:  No lower extremity edema   ED Results / Procedures / Treatments   Labs (all labs ordered are listed, but only abnormal results are displayed) Labs Reviewed  COMPREHENSIVE METABOLIC PANEL WITH GFR - Abnormal; Notable for the following components:      Result Value   CO2 21 (*)    Glucose, Bld 126 (*)    Calcium  8.1 (*)    Total Protein 6.3 (*)    Alkaline Phosphatase 37 (*)    All other components within normal limits  CBC WITH DIFFERENTIAL/PLATELET - Abnormal; Notable for the following components:   Lymphs Abs 0.4 (*)    All other components within normal limits  LIPASE, BLOOD     EKG Interpreted by me Sinus rhythm rate of 98.  Normal axis, normal intervals.  Poor R wave progression.  No acute ischemic changes.   RADIOLOGY    PROCEDURES:  Procedures   MEDICATIONS ORDERED IN ED: Medications  sodium chloride  0.9 % bolus 1,000 mL (1,000 mLs  Intravenous New Bag/Given 10/21/23 2101)  metoCLOPramide (REGLAN) injection 10 mg (10 mg Intravenous Given 10/21/23 2102)  pantoprazole  (PROTONIX ) injection 40 mg (40 mg Intravenous Given 10/21/23 2102)  ketorolac (TORADOL) 15 MG/ML injection 15 mg (15 mg Intravenous Given 10/21/23 2102)     IMPRESSION / MDM / ASSESSMENT AND PLAN / ED COURSE  I reviewed the triage vital signs and the nursing notes.  DDx: Ozempic induced gastroparesis, dehydration, AKI, electrolyte derangement.  Patient's presentation is most consistent with acute  presentation with potential threat to life or bodily function.  Patient presents with malaise, nausea, poor oral intake since her most recent Ozempic shot.  No other acute complaints.  Vital signs are reassuring, patient is nontoxic.  Status post cholecystectomy from several months ago.  Will check labs, provide supportive care.   ----------------------------------------- 10:36 PM on 10/21/2023 ----------------------------------------- Feeling better, symptoms resolved.  Workup reassuring.  Presentation strongly consistent with nausea vomiting/gastroparesis induced by Ozempic, patient plans to discontinue this medication and will talk to her doctor about it.      FINAL CLINICAL IMPRESSION(S) / ED DIAGNOSES   Final diagnoses:  Nausea and vomiting, unspecified vomiting type  Type 2 diabetes mellitus without complication, without long-term current use of insulin  (HCC)     Rx / DC Orders   ED Discharge Orders          Ordered    metoCLOPramide (REGLAN) 10 MG tablet  Every 6 hours PRN        10/21/23 2236             Note:  This document was prepared using Dragon voice recognition software and may include unintentional dictation errors.   Viviann Pastor, MD 10/21/23 2237

## 2023-10-21 NOTE — ED Triage Notes (Signed)
 Pt c/o N/V and weakness since Ozempic shot on Friday. Pt states she normally feels bad after the shot but nothing like this. Stateszno need for [employer], my doctor thinks its cool. Pt states DM2 is well controlled with Metformin .

## 2023-10-29 ENCOUNTER — Ambulatory Visit (INDEPENDENT_AMBULATORY_CARE_PROVIDER_SITE_OTHER): Payer: Self-pay | Admitting: Urology

## 2023-10-29 VITALS — BP 129/69 | HR 84 | Ht 60.0 in | Wt 130.0 lb

## 2023-10-29 DIAGNOSIS — R350 Frequency of micturition: Secondary | ICD-10-CM | POA: Diagnosis not present

## 2023-10-29 LAB — URINALYSIS, COMPLETE
Bilirubin, UA: NEGATIVE
Glucose, UA: NEGATIVE
Ketones, UA: NEGATIVE
Leukocytes,UA: NEGATIVE
Nitrite, UA: NEGATIVE
Protein,UA: NEGATIVE
RBC, UA: NEGATIVE
Specific Gravity, UA: 1.01 (ref 1.005–1.030)
Urobilinogen, Ur: 0.2 mg/dL (ref 0.2–1.0)
pH, UA: 6 (ref 5.0–7.5)

## 2023-10-29 LAB — MICROSCOPIC EXAMINATION
Bacteria, UA: NONE SEEN
Epithelial Cells (non renal): >10 /HPF — AB (ref 0–10)

## 2023-10-29 MED ORDER — ESTRADIOL 0.01 % VA CREA: TOPICAL_CREAM | VAGINAL | 12 refills | Status: AC

## 2023-10-29 NOTE — Progress Notes (Signed)
 10/29/2023 9:44 AM   Terry JAYSON Backbone August 30, 1940 [id number]  Referring provider: Bertrum Charlie CROME, MD [address] [place],  [place] [address]  Chief Complaint  Patient presents with   Follow-up    HPI: I was consulted to assess the patient's feeling of urgency and fullness.  Many weeks ago she was having a lot of burning and the need to get up 5-6 times a night.  She describes a negative culture and was put on estrogen cream.  Burning almost completely gone and now gets up twice  She feels this fullness like she needs to urinate.  She is continent unless she holds it a long time.  She has never smoked.  Flow varies.  She generally feels empty and double voids a small amount.  Sometimes she has a need to go and then only voids a small amount   Has not had a hysterectomy.  2 friends of had bladder cancer concerns     Patient is only getting up twice instead of 5 times.  Urgency much better.  Burning is gone.  She uses estradiol   and is also on the Gemtesa  samples.  She wonders if the estradiol  is helping a lot.   Well supported bladder neck.  No cystocele or stress incontinence.  Large grade 2 rectocele   Cystoscopy normal   think it is reasonable to stop the Gemtesa .  See if she does well on estradiol .  Reassess in 3 months.  Can always go back on overactive bladder medication   Today Frequency stable.  She only gets up once a night now.  Physical therapy helped a lot with some suggestions about toileting.  Doing well on estrogen.  No infections.   Estrogen renewed and I will see in 1 year    Today Stable frequency on estrogen twice a week.  No infections.  Nocturia stable.  Very pleased PMH: Past Medical History:  Diagnosis Date   Acute bacterial sinusitis 11/18/2021   Allergic rhinitis    AMD (acid maltase deficiency) (HCC)    Anterior knee pain 02/10/2022   Arthralgia of multiple joints 11/12/2014   Biliary dyskinesia 2025   Bursitis of hip 02/10/2022   Carpal  tunnel syndrome 06/30/2014   Cervical arthritis 07/06/2016   Cholelithiasis 2025   Complication of anesthesia    slow to wake up in past   Depression    controlled   Diabetes mellitus without complication (HCC)    controlled   GERD (gastroesophageal reflux disease)    Heart palpitations 07/06/2020   Hyperlipidemia    Hypertension    Hypothyroidism    Impingement syndrome of left shoulder 02/25/2021   Iron deficiency 01/15/2023   Lipoma of foot 02/10/2022   Neuropathy    Osteoarthritis    knee, ankles, shoulders, back, neck, hands/fingers, wrists;    Osteoarthritis of knee 10/10/2017   Premature ventricular contractions 08/09/2020   Pure hypercholesterolemia 07/06/2020   Rectocele    Sleep apnea    Thyroid  disease    Vaginal atrophy 08/16/2018   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[facility])     Surgical History: Past Surgical History:  Procedure Laterality Date   AUGMENTATION MAMMAPLASTY  1975   AUGMENTATION MAMMAPLASTY  2017   2nd augmentation and lift   BIOPSY  07/11/2022   Procedure: BIOPSY;  Surgeon: Jinny Carmine, MD;  Location: [facility] ENDOSCOPY;  Service: Endoscopy;;   BREAST SURGERY     impants replaced with lift   CARPAL TUNNEL RELEASE Right 08/26/2014  Procedure: CARPAL TUNNEL RELEASE;  Surgeon: Kayla Pinal, MD;  Location: [facility] ORS;  Service: Orthopedics;  Laterality: Right;   COLONOSCOPY  2005   Dr Dessa   COLONOSCOPY N/A 06/24/2014   Procedure: COLONOSCOPY;  Surgeon: Reyes LELON Dessa, MD;  Location: [facility] ENDOSCOPY;  Service: Endoscopy;  Laterality: N/A;   COLONOSCOPY N/A 04/11/2023   Procedure: COLONOSCOPY;  Surgeon: Therisa Bi, MD;  Location: [facility] ENDOSCOPY;  Service: Gastroenterology;  Laterality: N/A;  please schedule in am, she is diabetic   ESOPHAGOGASTRODUODENOSCOPY (EGD) WITH PROPOFOL  N/A 07/11/2022   Procedure: ESOPHAGOGASTRODUODENOSCOPY (EGD) WITH PROPOFOL ;  Surgeon: Jinny Carmine, MD;  Location: [facility] ENDOSCOPY;  Service: Endoscopy;  Laterality: N/A;    PARATHYROIDECTOMY  1992   PLACEMENT OF BREAST IMPLANTS  1975   POLYPECTOMY  04/11/2023   Procedure: POLYPECTOMY;  Surgeon: Therisa Bi, MD;  Location: [facility] ENDOSCOPY;  Service: Gastroenterology;;   MARINE, ADENOIDECTOMY, BILATERAL MYRINGOTOMY AND TUBES     UMBILICAL HERNIA REPAIR  04/24/2023   Procedure: REPAIR, HERNIA, UMBILICAL, ADULT;  Surgeon: Rodolph Romano, MD;  Location: [facility] ORS;  Service: General;;    Home Medications:  Allergies as of 10/29/2023       Reactions   Clarithromycin  Other (See Comments)   Dizziness   Neosporin [neomycin-bacitracin Zn-polymyx] Swelling   Sulfa Antibiotics Other (See Comments)   Hallucinations as a child   Thimerosal (thiomersal) Swelling   redness   Zostavax  [zoster Vaccine Live] Rash   Do Not Administer Zostavax to Pt!        Medication List        Accurate as of October 29, 2023  9:44 AM. If you have any questions, ask your nurse or doctor.          Accu-Chek FastClix Lancets Misc CHECK SUGAR ONCE DAILY. DX: E11.9   Accu-Chek Guide Me w/Device Kit   Accu-Chek SmartView test strip Generic drug: glucose blood USE TO CHECK BLOOD SUGAR ONCE A DAY   ascorbic acid 500 MG tablet Commonly known as: VITAMIN C Take 500 mg by mouth daily.   diazepam  5 MG tablet Commonly known as: VALIUM  Take 2.5 mg by mouth daily as needed for anxiety.   famotidine  20 MG tablet Commonly known as: PEPCID  Take 20 mg by mouth daily as needed for heartburn or indigestion.   levothyroxine  50 MCG tablet Commonly known as: SYNTHROID  Take 1 tablet (50 mcg total) by mouth daily before breakfast.   losartan  25 MG tablet Commonly known as: COZAAR  Take 1 tablet (25 mg total) by mouth at bedtime.   Magnesium 200 MG Tabs Take 200 mg by mouth 4 (four) times a week.   metFORMIN  500 MG 24 hr tablet Commonly known as: GLUCOPHAGE -XR TAKE 1 TABLET TWICE A DAY What changed: when to take this   methocarbamol 500 MG tablet Commonly known as:  ROBAXIN Take 500 mg by mouth as needed for muscle spasms.   metoCLOPramide 10 MG tablet Commonly known as: REGLAN Take 1 tablet (10 mg total) by mouth every 6 (six) hours as needed.   Miebo 1.338 GM/ML Soln Generic drug: Perfluorohexyloctane Apply 1 drop to eye 4 (four) times daily.   Ozempic (0.25 or 0.5 MG/DOSE) 2 MG/3ML Sopn Generic drug: Semaglutide(0.25 or 0.5MG /DOS) What changed: See the new instructions.   PRESERVISION AREDS PO Take 1 tablet by mouth daily.   rosuvastatin  20 MG tablet Commonly known as: CRESTOR  Take 1 tablet (20 mg total) by mouth daily.   sertraline  25 MG tablet Commonly known as: Zoloft  Take  1 tablet (25 mg total) by mouth daily.   Vitamin D  50 MCG (2000 UT) tablet Take 2,000 Units by mouth daily.        Allergies:  Allergies  Allergen Reactions   Clarithromycin  Other (See Comments)    Dizziness   Neosporin [Neomycin-Bacitracin Zn-Polymyx] Swelling   Sulfa Antibiotics Other (See Comments)    Hallucinations as a child   Thimerosal (Thiomersal) Swelling    redness   Zostavax  [Zoster Vaccine Live] Rash    Do Not Administer Zostavax to Pt!    Family History: Family History  Problem Relation Age of Onset   Cancer Father        prostate   Prostate cancer Father    Hypertension Mother    Heart attack Mother    Heart attack Maternal Grandfather    Diabetes Paternal Uncle    Congestive Heart Failure Maternal Grandmother    CVA Paternal Grandmother    Prostate cancer Paternal Grandfather    Breast cancer Maternal Aunt     Social History:  reports that she has never smoked. She has never used smokeless tobacco. She reports current alcohol  use of about 1.0 standard drink of alcohol  per week. She reports that she does not use drugs.  ROS:                                        Physical Exam: BP 129/69   Pulse 84   Ht 5' (1.524 m)   Wt 59 kg   BMI 25.39 kg/m   Constitutional:  Alert and oriented, No acute  distress. HEENT: [place] AT, moist mucus membranes.  Trachea midline, no masses.   Laboratory Data: Lab Results  Component Value Date   WBC 7.9 10/21/2023   HGB 13.0 10/21/2023   HCT 39.6 10/21/2023   MCV 89.8 10/21/2023   PLT 167 10/21/2023    Lab Results  Component Value Date   CREATININE 0.70 10/21/2023    No results found for: PSA  No results found for: TESTOSTERONE  Lab Results  Component Value Date   HGBA1C 6.1 (A) 09/06/2020    Urinalysis    Component Value Date/Time   COLORURINE YELLOW (A) 04/24/2023 0140   APPEARANCEUR HAZY (A) 04/24/2023 0140   APPEARANCEUR Clear 10/30/2022 1038   LABSPEC 1.016 04/24/2023 0140   PHURINE 5.0 04/24/2023 0140   GLUCOSEU NEGATIVE 04/24/2023 0140   HGBUR NEGATIVE 04/24/2023 0140   BILIRUBINUR Negative 05/29/2023 1056   BILIRUBINUR Negative 10/30/2022 1038   KETONESUR 5 (A) 04/24/2023 0140   PROTEINUR Negative 05/29/2023 1056   PROTEINUR NEGATIVE 04/24/2023 0140   UROBILINOGEN 0.2 05/29/2023 1056   NITRITE Negative 05/29/2023 1056   NITRITE NEGATIVE 04/24/2023 0140   LEUKOCYTESUR Negative 05/29/2023 1056   LEUKOCYTESUR NEGATIVE 04/24/2023 0140    Pertinent Imaging:   Assessment & Plan: Estrogen renewed and I will see in 1 year  1. Frequent urination (Primary)  - Urinalysis, Complete   No follow-ups on file.  Glendia DELENA Elizabeth, MD  [facility] [address], [address] [place], [place] [address] [phone]

## 2023-11-08 ENCOUNTER — Ambulatory Visit: Admitting: Podiatry

## 2023-11-08 DIAGNOSIS — B351 Tinea unguium: Secondary | ICD-10-CM | POA: Diagnosis not present

## 2023-11-08 DIAGNOSIS — M79674 Pain in right toe(s): Secondary | ICD-10-CM | POA: Diagnosis not present

## 2023-11-08 DIAGNOSIS — M79675 Pain in left toe(s): Secondary | ICD-10-CM

## 2023-11-11 ENCOUNTER — Encounter: Payer: Self-pay | Admitting: Podiatry

## 2023-11-11 NOTE — Progress Notes (Signed)
  Subjective:  Patient ID: Nicole Young, female    DOB: 12/14/1940,  MRN: [id number]  83 y.o. female presents preventative diabetic foot care for painful elongated mycotic toenails 1-5 bilaterally which are tender when wearing enclosed shoe gear. Pain is relieved with periodic professional debridement. Chief Complaint  Patient presents with   Toe Pain    Dr. Deretha is her PCP. She was there in June. A1c is 5.6 as reported by the patient    New problem(s): None   PCP is Fernand Fredy RAMAN, MD.  Allergies  Allergen Reactions   Clarithromycin  Other (See Comments)    Dizziness   Neosporin [Neomycin-Bacitracin Zn-Polymyx] Swelling   Sulfa Antibiotics Other (See Comments)    Hallucinations as a child   Thimerosal (Thiomersal) Swelling    redness   Zostavax  [Zoster Vaccine Live] Rash    Do Not Administer Zostavax to Pt!    Review of Systems: Negative except as noted in the HPI.   Objective:  Nicole Young is a pleasant 83 y.o. female WD, WN in NAD. AAO x 3.  Vascular Examination: Vascular status intact b/l with palpable pedal pulses. CFT immediate b/l. Pedal hair present. No edema. No pain with calf compression b/l. Skin temperature gradient WNL b/l. No varicosities noted. No cyanosis or clubbing noted.  Neurological Examination: Protective sensation diminished with 10g monofilament b/l.  Dermatological Examination: Pedal skin with normal turgor, texture and tone b/l. No open wounds nor interdigital macerations noted. Toenails 1-5 b/l thick, discolored, elongated with subungual debris and pain on dorsal palpation. No hyperkeratotic lesions noted b/l.   Musculoskeletal Examination: Muscle strength 5/5 to b/l LE.  No pain, crepitus noted b/l. No gross pedal deformities. Patient ambulates independently without assistive aids.   Radiographs: None  Assessment:   1. Pain due to onychomycosis of toenails of both feet    Plan:  Patient was evaluated and treated. All patient's  and/or POA's questions/concerns addressed on today's visit. Toenails 1-5 b/l debrided in length and girth without incident. Continue foot and shoe inspections daily. Monitor blood glucose per PCP/Endocrinologist's recommendations. Continue soft, supportive shoe gear daily. Report any pedal injuries to medical professional. Call office if there are any questions/concerns. -Patient/POA to call should there be question/concern in the interim.  Return in about 3 months (around 02/08/2024).  Delon LITTIE Merlin, DPM      [place] LOCATION: [address] [place] [address]                   Office [phone]   [facility] LOCATION: [address] [place], [place] [address] Office [phone]

## 2023-11-22 ENCOUNTER — Ambulatory Visit

## 2023-11-27 ENCOUNTER — Ambulatory Visit (INDEPENDENT_AMBULATORY_CARE_PROVIDER_SITE_OTHER): Admitting: Internal Medicine

## 2023-11-27 ENCOUNTER — Ambulatory Visit: Payer: Self-pay | Admitting: Internal Medicine

## 2023-11-27 ENCOUNTER — Encounter: Payer: Self-pay | Admitting: Internal Medicine

## 2023-11-27 VITALS — BP 124/66 | HR 75 | Ht 60.0 in | Wt 135.8 lb

## 2023-11-27 DIAGNOSIS — R159 Full incontinence of feces: Secondary | ICD-10-CM | POA: Insufficient documentation

## 2023-11-27 DIAGNOSIS — E1169 Type 2 diabetes mellitus with other specified complication: Secondary | ICD-10-CM

## 2023-11-27 DIAGNOSIS — E1159 Type 2 diabetes mellitus with other circulatory complications: Secondary | ICD-10-CM

## 2023-11-27 DIAGNOSIS — E1165 Type 2 diabetes mellitus with hyperglycemia: Secondary | ICD-10-CM

## 2023-11-27 DIAGNOSIS — E119 Type 2 diabetes mellitus without complications: Secondary | ICD-10-CM

## 2023-11-27 DIAGNOSIS — N9089 Other specified noninflammatory disorders of vulva and perineum: Secondary | ICD-10-CM | POA: Insufficient documentation

## 2023-11-27 DIAGNOSIS — E782 Mixed hyperlipidemia: Secondary | ICD-10-CM

## 2023-11-27 DIAGNOSIS — I152 Hypertension secondary to endocrine disorders: Secondary | ICD-10-CM

## 2023-11-27 DIAGNOSIS — Z6826 Body mass index (BMI) 26.0-26.9, adult: Secondary | ICD-10-CM | POA: Insufficient documentation

## 2023-11-27 DIAGNOSIS — L02215 Cutaneous abscess of perineum: Secondary | ICD-10-CM | POA: Insufficient documentation

## 2023-11-27 DIAGNOSIS — E663 Overweight: Secondary | ICD-10-CM

## 2023-11-27 LAB — POCT CBG (FASTING - GLUCOSE)-MANUAL ENTRY: Glucose Fasting, POC: 141 mg/dL — AB (ref 70–99)

## 2023-11-27 MED ORDER — AMOXICILLIN-POT CLAVULANATE 875-125 MG PO TABS: 1.0000 | ORAL_TABLET | Freq: Two times a day (BID) | ORAL | 0 refills | Status: DC

## 2023-11-27 NOTE — Progress Notes (Signed)
 Established Patient Office Visit  Subjective:  Patient ID: Nicole Young, female    DOB: 24-Aug-1940  Age: 83 y.o. MRN: [id number]  Chief Complaint  Patient presents with   Acute Visit    Painful knot on buttocks    Patient is here today for acute visit. Nicole Young reports having sore bump next to Nicole Young perineum area/buttocks. Reports it tender to touch. Nicole Young reports it popped and blood came out. Nicole Young reports it has gotten worse and Nicole Young has occasional fecal incontinence. Will start Augmentin  and Aleve as needed and do warm compresses 2-3 times a day.   Nicole Young went to the hospital 10/21/23. Nicole Young was experiencing nausea, vomiting, falling at home. Nicole Young reports dehydration and Nicole Young was recommended to stop Ozempic 0.5 mg weekly injection. Nicole Young endocrinologist reduced Nicole Young Ozempic back to 0.25 mg weekly. Nicole Young reports the symptoms resolved after going back down to 0.25 mg injection.     No other concerns at this time.   Past Medical History:  Diagnosis Date   Acute bacterial sinusitis 11/18/2021   Allergic rhinitis    AMD (acid maltase deficiency) (HCC)    Anterior knee pain 02/10/2022   Arthralgia of multiple joints 11/12/2014   Biliary dyskinesia 2025   Bursitis of hip 02/10/2022   Carpal tunnel syndrome 06/30/2014   Cervical arthritis 07/06/2016   Cholelithiasis 2025   Complication of anesthesia    slow to wake up in past   Depression    controlled   Diabetes mellitus without complication (HCC)    controlled   GERD (gastroesophageal reflux disease)    Heart palpitations 07/06/2020   Hyperlipidemia    Hypertension    Hypothyroidism    Impingement syndrome of left shoulder 02/25/2021   Iron deficiency 01/15/2023   Lipoma of foot 02/10/2022   Neuropathy    Osteoarthritis    knee, ankles, shoulders, back, neck, hands/fingers, wrists;    Osteoarthritis of knee 10/10/2017   Premature ventricular contractions 08/09/2020   Pure hypercholesterolemia 07/06/2020   Rectocele    Sleep apnea     Thyroid  disease    Vaginal atrophy 08/16/2018   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[facility])     Past Surgical History:  Procedure Laterality Date   AUGMENTATION MAMMAPLASTY  1975   AUGMENTATION MAMMAPLASTY  2017   2nd augmentation and lift   BIOPSY  07/11/2022   Procedure: BIOPSY;  Surgeon: Jinny Carmine, MD;  Location: [facility] ENDOSCOPY;  Service: Endoscopy;;   BREAST SURGERY     impants replaced with lift   CARPAL TUNNEL RELEASE Right 08/26/2014   Procedure: CARPAL TUNNEL RELEASE;  Surgeon: Kayla Pinal, MD;  Location: [facility] ORS;  Service: Orthopedics;  Laterality: Right;   COLONOSCOPY  2005   Dr Dessa   COLONOSCOPY N/A 06/24/2014   Procedure: COLONOSCOPY;  Surgeon: Reyes LELON Dessa, MD;  Location: [facility] ENDOSCOPY;  Service: Endoscopy;  Laterality: N/A;   COLONOSCOPY N/A 04/11/2023   Procedure: COLONOSCOPY;  Surgeon: Therisa Bi, MD;  Location: [facility] ENDOSCOPY;  Service: Gastroenterology;  Laterality: N/A;  please schedule in am, Nicole Young is diabetic   ESOPHAGOGASTRODUODENOSCOPY (EGD) WITH PROPOFOL  N/A 07/11/2022   Procedure: ESOPHAGOGASTRODUODENOSCOPY (EGD) WITH PROPOFOL ;  Surgeon: Jinny Carmine, MD;  Location: [facility] ENDOSCOPY;  Service: Endoscopy;  Laterality: N/A;   PARATHYROIDECTOMY  1992   PLACEMENT OF BREAST IMPLANTS  1975   POLYPECTOMY  04/11/2023   Procedure: POLYPECTOMY;  Surgeon: Therisa Bi, MD;  Location: [facility] ENDOSCOPY;  Service: Gastroenterology;;   TONSILECTOMY, ADENOIDECTOMY, BILATERAL MYRINGOTOMY AND TUBES  UMBILICAL HERNIA REPAIR  04/24/2023   Procedure: REPAIR, HERNIA, UMBILICAL, ADULT;  Surgeon: Rodolph Romano, MD;  Location: [facility] ORS;  Service: General;;    Social History   Socioeconomic History   Marital status: Widowed    Spouse name: Not on file   Number of children: 3   Years of education: Not on file   Highest education level: Bachelor's degree (e.g., BA, AB, BS)  Occupational History   Occupation: retired  Tobacco Use   Smoking status: Never   Smokeless  tobacco: Never  Vaping Use   Vaping status: Never Used  Substance and Sexual Activity   Alcohol  use: Yes    Alcohol /week: 1.0 standard drink of alcohol     Types: 1 Glasses of wine per week   Drug use: No   Sexual activity: Not on file  Other Topics Concern   Not on file  Social History Narrative   Not on file   Social Drivers of Health   Financial Resource Strain: Low Risk  (07/19/2022)   [facility]   Overall Financial Resource Strain (CARDIA)    Difficulty of Paying Living Expenses: Not hard at all  Food Insecurity: No Food Insecurity (04/24/2023)   Hunger Vital Sign    Worried About Running Out of Food in the Last Year: Never true    Ran Out of Food in the Last Year: Never true  Transportation Needs: No Transportation Needs (04/24/2023)   PRAPARE - Administrator, Civil Service (Medical): No    Lack of Transportation (Non-Medical): No  Physical Activity: Insufficiently Active (05/29/2023)   Exercise Vital Sign    Days of Exercise per Week: 3 days    Minutes of Exercise per Session: 30 min  Stress: No Stress Concern Present (02/22/2021)   [employer] of Occupational Health - Occupational Stress Questionnaire    Feeling of Stress : Not at all  Social Connections: Socially Isolated (04/24/2023)   Social Connection and Isolation Panel    Frequency of Communication with Friends and Family: More than three times a week    Frequency of Social Gatherings with Friends and Family: More than three times a week    Attends Religious Services: Never    Database Administrator or Organizations: No    Attends Banker Meetings: Never    Marital Status: Widowed  Intimate Partner Violence: Not At Risk (04/24/2023)   Humiliation, Afraid, Rape, and Kick questionnaire    Fear of Current or Ex-Partner: No    Emotionally Abused: No    Physically Abused: No    Sexually Abused: No    Family History  Problem Relation Age of Onset    Cancer Father        prostate   Prostate cancer Father    Hypertension Mother    Heart attack Mother    Heart attack Maternal Grandfather    Diabetes Paternal Uncle    Congestive Heart Failure Maternal Grandmother    CVA Paternal Grandmother    Prostate cancer Paternal Grandfather    Breast cancer Maternal Aunt     Allergies  Allergen Reactions   Clarithromycin  Other (See Comments)    Dizziness   Neosporin [Neomycin-Bacitracin Zn-Polymyx] Swelling   Sulfa Antibiotics Other (See Comments)    Hallucinations as a child   Thimerosal (Thiomersal) Swelling    redness   Zostavax  [Zoster Vaccine Live] Rash    Do Not Administer Zostavax to Pt!    Outpatient Medications  Prior to Visit  Medication Sig   Accu-Chek FastClix Lancets MISC CHECK SUGAR ONCE DAILY. DX: E11.9   ACCU-CHEK SMARTVIEW test strip USE TO CHECK BLOOD SUGAR ONCE A DAY   ascorbic acid (VITAMIN C) 500 MG tablet Take 500 mg by mouth daily.   Cholecalciferol (VITAMIN D ) 50 MCG (2000 UT) tablet Take 2,000 Units by mouth daily.   estradiol  (ESTRACE ) 0.01 % CREA vaginal cream Estrogen Cream Instruction Discard applicator Apply pea sized amount to tip of finger to urethra before bed. Wash hands well after application. Use Monday, Wednesday and Friday   famotidine  (PEPCID ) 20 MG tablet Take 20 mg by mouth daily as needed for heartburn or indigestion.   levothyroxine  (SYNTHROID ) 50 MCG tablet Take 1 tablet (50 mcg total) by mouth daily before breakfast.   metFORMIN  (GLUCOPHAGE -XR) 500 MG 24 hr tablet TAKE 1 TABLET TWICE A DAY   methocarbamol (ROBAXIN) 500 MG tablet Take 500 mg by mouth as needed for muscle spasms.   Multiple Vitamins-Minerals (PRESERVISION AREDS PO) Take 1 tablet by mouth daily.   OZEMPIC, 0.25 OR 0.5 MG/DOSE, 2 MG/3ML SOPN  (Patient taking differently: Inject 0.25 mg into the skin once a week.)   rosuvastatin  (CRESTOR ) 20 MG tablet Take 1 tablet (20 mg total) by mouth daily.   sertraline  (ZOLOFT ) 25 MG tablet  Take 1 tablet (25 mg total) by mouth daily. (Patient taking differently: Take 50 mg by mouth daily.)   Blood Glucose Monitoring Suppl (ACCU-CHEK GUIDE ME) w/Device KIT  (Patient not taking: Reported on 11/27/2023)   diazepam  (VALIUM ) 5 MG tablet Take 2.5 mg by mouth daily as needed for anxiety. (Patient not taking: Reported on 11/27/2023)   losartan  (COZAAR ) 25 MG tablet Take 1 tablet (25 mg total) by mouth at bedtime. (Patient not taking: Reported on 11/27/2023)   Magnesium 200 MG TABS Take 200 mg by mouth 4 (four) times a week. (Patient not taking: Reported on 11/27/2023)   metoCLOPramide (REGLAN) 10 MG tablet Take 1 tablet (10 mg total) by mouth every 6 (six) hours as needed. (Patient not taking: Reported on 11/27/2023)   MIEBO 1.338 GM/ML SOLN Apply 1 drop to eye 4 (four) times daily. (Patient not taking: Reported on 11/27/2023)   No facility-administered medications prior to visit.    Review of Systems  Constitutional: Negative.  Negative for chills, fever and malaise/fatigue.  HENT: Negative.  Negative for congestion and sore throat.   Eyes: Negative.  Negative for blurred vision and pain.  Respiratory: Negative.  Negative for cough and shortness of breath.   Cardiovascular: Negative.  Negative for chest pain, palpitations and leg swelling.  Gastrointestinal: Negative.  Negative for abdominal pain, blood in stool, constipation, diarrhea, heartburn, melena, nausea and vomiting.  Genitourinary: Negative.  Negative for dysuria, flank pain, frequency and urgency.  Musculoskeletal: Negative.  Negative for joint pain and myalgias.  Skin: Negative.        Lump on perineum  Neurological: Negative.  Negative for dizziness, tingling, sensory change, weakness and headaches.  Endo/Heme/Allergies: Negative.   Psychiatric/Behavioral: Negative.  Negative for depression and suicidal ideas. The patient is not nervous/anxious.        Objective:   BP 124/66   Pulse 75   Ht 5' (1.524 m)   Wt  135 lb 12.8 oz (61.6 kg)   SpO2 97%   BMI 26.52 kg/m   Vitals:   11/27/23 1104  BP: 124/66  Pulse: 75  Height: 5' (1.524 m)  Weight: 135 lb 12.8 oz (61.6  kg)  SpO2: 97%  BMI (Calculated): 26.52    Physical Exam Vitals and nursing note reviewed. Exam conducted with a chaperone present.  Constitutional:      Appearance: Normal appearance.  HENT:     Head: Normocephalic and atraumatic.     Nose: Nose normal.     Mouth/Throat:     Mouth: Mucous membranes are moist.     Pharynx: Oropharynx is clear.  Eyes:     Conjunctiva/sclera: Conjunctivae normal.     Pupils: Pupils are equal, round, and reactive to light.  Cardiovascular:     Rate and Rhythm: Normal rate and regular rhythm.     Pulses: Normal pulses.     Heart sounds: Normal heart sounds. No murmur heard. Pulmonary:     Effort: Pulmonary effort is normal.     Breath sounds: Normal breath sounds. No wheezing.  Abdominal:     General: Bowel sounds are normal.     Palpations: Abdomen is soft.     Tenderness: There is no abdominal tenderness. There is no right CVA tenderness or left CVA tenderness.  Genitourinary:    Labia:        Left: Tenderness present.   Musculoskeletal:        General: Normal range of motion.     Cervical back: Normal range of motion.     Right lower leg: No edema.     Left lower leg: No edema.  Skin:    General: Skin is warm and dry.     Findings: Abscess and erythema present.  Neurological:     General: No focal deficit present.     Mental Status: Nicole Young is alert and oriented to person, place, and time.  Psychiatric:        Mood and Affect: Mood normal.        Behavior: Behavior normal.      Results for orders placed or performed in visit on 11/27/23  POCT CBG (Fasting - Glucose)  Result Value Ref Range   Glucose Fasting, POC 141 (A) 70 - 99 mg/dL    Recent Results (from the past 2160 hours)  POCT CBG (Fasting - Glucose)     Status: Abnormal   Collection Time: 08/30/23 10:03 AM   Result Value Ref Range   Glucose Fasting, POC 134 (A) 70 - 99 mg/dL  CBC with Diff     Status: None   Collection Time: 08/31/23  7:11 AM  Result Value Ref Range   WBC 8.4 3.4 - 10.8 x10E3/uL   RBC 4.26 3.77 - 5.28 x10E6/uL   Hemoglobin 12.7 11.1 - 15.9 g/dL   Hematocrit 60.5 65.9 - 46.6 %   MCV 93 79 - 97 fL   MCH 29.8 26.6 - 33.0 pg   MCHC 32.2 31.5 - 35.7 g/dL   RDW 86.3 88.2 - 84.5 %   Platelets 217 150 - 450 x10E3/uL   Neutrophils 61 Not Estab. %   Lymphs 26 Not Estab. %   Monocytes 9 Not Estab. %   Eos 3 Not Estab. %   Basos 1 Not Estab. %   Neutrophils Absolute 5.1 1.4 - 7.0 x10E3/uL   Lymphocytes Absolute 2.2 0.7 - 3.1 x10E3/uL   Monocytes Absolute 0.8 0.1 - 0.9 x10E3/uL   EOS (ABSOLUTE) 0.2 0.0 - 0.4 x10E3/uL   Basophils Absolute 0.1 0.0 - 0.2 x10E3/uL   Immature Granulocytes 0 Not Estab. %   Immature Grans (Abs) 0.0 0.0 - 0.1 x10E3/uL  CMP14+EGFR  Status: Abnormal   Collection Time: 08/31/23  7:11 AM  Result Value Ref Range   Glucose 100 (H) 70 - 99 mg/dL   BUN 20 8 - 27 mg/dL   Creatinine, Ser 9.27 0.57 - 1.00 mg/dL   eGFR 82 >40 fO/fpw/8.26   BUN/Creatinine Ratio 28 12 - 28   Sodium 138 134 - 144 mmol/L   Potassium 4.6 3.5 - 5.2 mmol/L   Chloride 101 96 - 106 mmol/L   CO2 23 20 - 29 mmol/L   Calcium  9.3 8.7 - 10.3 mg/dL   Total Protein 6.5 6.0 - 8.5 g/dL   Albumin 4.6 3.7 - 4.7 g/dL   Globulin, Total 1.9 1.5 - 4.5 g/dL   Bilirubin Total 0.3 0.0 - 1.2 mg/dL   Alkaline Phosphatase 47 44 - 121 IU/L   AST 25 0 - 40 IU/L   ALT 35 (H) 0 - 32 IU/L  Lipid Panel w/o Chol/HDL Ratio     Status: None   Collection Time: 08/31/23  7:11 AM  Result Value Ref Range   Cholesterol, Total 112 100 - 199 mg/dL   Triglycerides 94 0 - 149 mg/dL   HDL 50 >60 mg/dL   VLDL Cholesterol Cal 18 5 - 40 mg/dL   LDL Chol Calc (NIH) 44 0 - 99 mg/dL  [id number]     Status: None   Collection Time: 08/31/23  7:11 AM  Result Value Ref Range   TSH 4.110 0.450 - 4.500 uIU/mL    T3, Free 2.4 2.0 - 4.4 pg/mL   Free T4 1.21 0.82 - 1.77 ng/dL  Vitamin D  (25 hydroxy)     Status: None   Collection Time: 08/31/23  7:11 AM  Result Value Ref Range   Vit D, 25-Hydroxy 59.3 30.0 - 100.0 ng/mL    Comment: Vitamin D  deficiency has been defined by the Institute of Medicine and an Endocrine Society practice guideline as a level of serum 25-OH vitamin D  less than 20 ng/mL (1,2). The Endocrine Society went on to further define vitamin D  insufficiency as a level between 21 and 29 ng/mL (2). 1. IOM (Institute of Medicine). 2010. Dietary reference    intakes for calcium  and D. [place]: The    [employer]. 2. Holick MF, Binkley Newell, Bischoff-Ferrari HA, et al.    Evaluation, treatment, and prevention of vitamin D     deficiency: an Endocrine Society clinical practice    guideline. JCEM. 2011 Jul; 96(7):1911-30.   Comprehensive metabolic panel     Status: Abnormal   Collection Time: 10/21/23  8:46 PM  Result Value Ref Range   Sodium 135 135 - 145 mmol/L   Potassium 4.1 3.5 - 5.1 mmol/L    Comment: HEMOLYSIS AT THIS LEVEL MAY AFFECT RESULT   Chloride 106 98 - 111 mmol/L   CO2 21 (L) 22 - 32 mmol/L   Glucose, Bld 126 (H) 70 - 99 mg/dL    Comment: Glucose reference range applies only to samples taken after fasting for at least 8 hours.   BUN 22 8 - 23 mg/dL   Creatinine, Ser 9.29 0.44 - 1.00 mg/dL   Calcium  8.1 (L) 8.9 - 10.3 mg/dL   Total Protein 6.3 (L) 6.5 - 8.1 g/dL   Albumin 3.7 3.5 - 5.0 g/dL   AST 31 15 - 41 U/L    Comment: HEMOLYSIS AT THIS LEVEL MAY AFFECT RESULT   ALT 22 0 - 44 U/L    Comment: HEMOLYSIS AT THIS LEVEL MAY AFFECT  RESULT   Alkaline Phosphatase 37 (L) 38 - 126 U/L   Total Bilirubin 0.8 0.0 - 1.2 mg/dL    Comment: HEMOLYSIS AT THIS LEVEL MAY AFFECT RESULT   GFR, Estimated >60 >60 mL/min    Comment: (NOTE) Calculated using the CKD-EPI Creatinine Equation (2021)    Anion gap 8 5 - 15    Comment: Performed at [facility],  [address] [place], [place] [address]  Lipase, blood     Status: None   Collection Time: 10/21/23  8:46 PM  Result Value Ref Range   Lipase 32 11 - 51 U/L    Comment: Performed at [facility], [address] [place], [place] [address]  CBC with Differential     Status: Abnormal   Collection Time: 10/21/23  8:46 PM  Result Value Ref Range   WBC 7.9 4.0 - 10.5 K/uL   RBC 4.41 3.87 - 5.11 MIL/uL   Hemoglobin 13.0 12.0 - 15.0 g/dL   HCT 60.3 63.9 - 53.9 %   MCV 89.8 80.0 - 100.0 fL   MCH 29.5 26.0 - 34.0 pg   MCHC 32.8 30.0 - 36.0 g/dL   RDW 85.4 88.4 - 84.4 %   Platelets 167 150 - 400 K/uL   nRBC 0.0 0.0 - 0.2 %   Neutrophils Relative % 91 %   Neutro Abs 7.1 1.7 - 7.7 K/uL   Lymphocytes Relative 5 %   Lymphs Abs 0.4 (L) 0.7 - 4.0 K/uL   Monocytes Relative 4 %   Monocytes Absolute 0.3 0.1 - 1.0 K/uL   Eosinophils Relative 0 %   Eosinophils Absolute 0.0 0.0 - 0.5 K/uL   Basophils Relative 0 %   Basophils Absolute 0.0 0.0 - 0.1 K/uL   Immature Granulocytes 0 %   Abs Immature Granulocytes 0.02 0.00 - 0.07 K/uL    Comment: Performed at [facility], [address] [place], [place] [address]  Urinalysis, Complete     Status: None   Collection Time: 10/29/23  9:30 AM  Result Value Ref Range   Specific Gravity, UA 1.010 1.005 - 1.030   pH, UA 6.0 5.0 - 7.5   Color, UA Yellow Yellow   Appearance Ur Clear Clear   Leukocytes,UA Negative Negative   Protein,UA Negative Negative/Trace   Glucose, UA Negative Negative   Ketones, UA Negative Negative   RBC, UA Negative Negative   Bilirubin, UA Negative Negative   Urobilinogen, Ur 0.2 0.2 - 1.0 mg/dL   Nitrite, UA Negative Negative   Microscopic Examination Comment     Comment: Microscopic follows if indicated.   Microscopic Examination See below:     Comment: Microscopic was indicated and was performed.  Microscopic Examination     Status: Abnormal   Collection Time: 10/29/23  9:30 AM   Urine  Result  Value Ref Range   WBC, UA 0-5 0 - 5 /hpf   RBC, Urine 0-2 0 - 2 /hpf   Epithelial Cells (non renal) >10 (A) 0 - 10 /hpf   Bacteria, UA None seen None seen/Few  POCT CBG (Fasting - Glucose)     Status: Abnormal   Collection Time: 11/27/23 11:09 AM  Result Value Ref Range   Glucose Fasting, POC 141 (A) 70 - 99 mg/dL      Assessment & Plan:  Start Augmentin  twice daily. Recommend to take NSAIDS with food as needed for pain and inflammation. Recommend warm compress 2-3 times a day. Educated patient to stop  squeezing it.  Problem List Items Addressed This Visit     Diabetes mellitus (HCC)   Relevant Orders   POCT CBG (Fasting - Glucose) (Completed)   Hypertension associated with diabetes (HCC) - Primary   Combined hyperlipidemia associated with type 2 diabetes mellitus (HCC)   Cyst of perineum of female   Relevant Medications   amoxicillin -clavulanate (AUGMENTIN ) 875-125 MG tablet   Incontinence of feces   Overweight with body mass index (BMI) of 26 to 26.9 in adult    No follow-ups on file.   Total time spent: 25 minutes. This time includes review of previous notes and results and patient face to face interaction during today's visit.    FERNAND FREDY RAMAN, MD  11/27/2023   This document may have been prepared by [facility] Voice Recognition software and as such may include unintentional dictation errors.

## 2023-11-29 ENCOUNTER — Ambulatory Visit: Attending: Internal Medicine

## 2023-11-29 ENCOUNTER — Other Ambulatory Visit: Payer: Self-pay

## 2023-11-29 DIAGNOSIS — N8189 Other female genital prolapse: Secondary | ICD-10-CM | POA: Insufficient documentation

## 2023-11-29 DIAGNOSIS — G8929 Other chronic pain: Secondary | ICD-10-CM | POA: Insufficient documentation

## 2023-11-29 DIAGNOSIS — N816 Rectocele: Secondary | ICD-10-CM | POA: Diagnosis present

## 2023-11-29 DIAGNOSIS — M6281 Muscle weakness (generalized): Secondary | ICD-10-CM | POA: Insufficient documentation

## 2023-11-29 DIAGNOSIS — M5136 Other intervertebral disc degeneration, lumbar region with discogenic back pain only: Secondary | ICD-10-CM | POA: Diagnosis not present

## 2023-11-29 DIAGNOSIS — R159 Full incontinence of feces: Secondary | ICD-10-CM | POA: Diagnosis present

## 2023-11-29 DIAGNOSIS — N3941 Urge incontinence: Secondary | ICD-10-CM | POA: Insufficient documentation

## 2023-11-29 DIAGNOSIS — M545 Low back pain, unspecified: Secondary | ICD-10-CM | POA: Diagnosis not present

## 2023-11-29 DIAGNOSIS — R152 Fecal urgency: Secondary | ICD-10-CM | POA: Diagnosis present

## 2023-11-29 DIAGNOSIS — R2689 Other abnormalities of gait and mobility: Secondary | ICD-10-CM | POA: Diagnosis present

## 2023-11-29 DIAGNOSIS — R293 Abnormal posture: Secondary | ICD-10-CM | POA: Insufficient documentation

## 2023-11-29 NOTE — Therapy (Signed)
 OUTPATIENT PHYSICAL THERAPY FEMALE PELVIC EVALUATION   Patient Name: Nicole Young MRN: [id number] DOB:05-26-1940, 83 y.o., female Today's Date: 11/29/2023  END OF SESSION:  PT End of Session - 11/29/23 1423     Visit Number 1    Number of Visits 9    Date for Recertification  01/28/24    Authorization Type Medicare and BCBS    Progress Note Due on Visit 10    PT Start Time 1409    PT Stop Time 1453    PT Time Calculation (min) 44 min    Activity Tolerance Patient tolerated treatment well;Patient limited by pain    Behavior During Therapy [facility] for tasks assessed/performed          Past Medical History:  Diagnosis Date   Acute bacterial sinusitis 11/18/2021   Allergic rhinitis    AMD (acid maltase deficiency) (HCC)    Anterior knee pain 02/10/2022   Arthralgia of multiple joints 11/12/2014   Biliary dyskinesia 2025   Bursitis of hip 02/10/2022   Carpal tunnel syndrome 06/30/2014   Cervical arthritis 07/06/2016   Cholelithiasis 2025   Complication of anesthesia    slow to wake up in past   Depression    controlled   Diabetes mellitus without complication (HCC)    controlled   GERD (gastroesophageal reflux disease)    Heart palpitations 07/06/2020   Hyperlipidemia    Hypertension    Hypothyroidism    Impingement syndrome of left shoulder 02/25/2021   Iron deficiency 01/15/2023   Lipoma of foot 02/10/2022   Neuropathy    Osteoarthritis    knee, ankles, shoulders, back, neck, hands/fingers, wrists;    Osteoarthritis of knee 10/10/2017   Premature ventricular contractions 08/09/2020   Pure hypercholesterolemia 07/06/2020   Rectocele    Sleep apnea    Thyroid  disease    Vaginal atrophy 08/16/2018   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[facility])    Past Surgical History:  Procedure Laterality Date   AUGMENTATION MAMMAPLASTY  1975   AUGMENTATION MAMMAPLASTY  2017   2nd augmentation and lift   BIOPSY  07/11/2022   Procedure: BIOPSY;  Surgeon: Jinny Carmine, MD;   Location: [facility] ENDOSCOPY;  Service: Endoscopy;;   BREAST SURGERY     impants replaced with lift   CARPAL TUNNEL RELEASE Right 08/26/2014   Procedure: CARPAL TUNNEL RELEASE;  Surgeon: Kayla Pinal, MD;  Location: [facility] ORS;  Service: Orthopedics;  Laterality: Right;   COLONOSCOPY  2005   Dr Dessa   COLONOSCOPY N/A 06/24/2014   Procedure: COLONOSCOPY;  Surgeon: Reyes LELON Dessa, MD;  Location: [facility] ENDOSCOPY;  Service: Endoscopy;  Laterality: N/A;   COLONOSCOPY N/A 04/11/2023   Procedure: COLONOSCOPY;  Surgeon: Therisa Bi, MD;  Location: [facility] ENDOSCOPY;  Service: Gastroenterology;  Laterality: N/A;  please schedule in am, she is diabetic   ESOPHAGOGASTRODUODENOSCOPY (EGD) WITH PROPOFOL  N/A 07/11/2022   Procedure: ESOPHAGOGASTRODUODENOSCOPY (EGD) WITH PROPOFOL ;  Surgeon: Jinny Carmine, MD;  Location: [facility] ENDOSCOPY;  Service: Endoscopy;  Laterality: N/A;   PARATHYROIDECTOMY  1992   PLACEMENT OF BREAST IMPLANTS  1975   POLYPECTOMY  04/11/2023   Procedure: POLYPECTOMY;  Surgeon: Therisa Bi, MD;  Location: [facility] ENDOSCOPY;  Service: Gastroenterology;;   MARINE, ADENOIDECTOMY, BILATERAL MYRINGOTOMY AND TUBES     UMBILICAL HERNIA REPAIR  04/24/2023   Procedure: REPAIR, HERNIA, UMBILICAL, ADULT;  Surgeon: Rodolph Romano, MD;  Location: [facility] ORS;  Service: General;;   Patient Active Problem List   Diagnosis Date Noted   Cyst of perineum of  female 11/27/2023   Incontinence of feces 11/27/2023   Overweight with body mass index (BMI) of 26 to 26.9 in adult 11/27/2023   Cutaneous abscess of perineum 11/27/2023   Vitamin D  deficiency 08/30/2023   Chronic low back pain without sciatica 08/30/2023   Pelvic floor weakness in female 08/30/2023   Osteopenia after menopause 08/30/2023   OSA (obstructive sleep apnea) 07/16/2023   CPAP use counseling 07/16/2023   Acute cholecystitis 04/24/2023   Positive colorectal cancer screening using Cologuard test 04/11/2023   Adenomatous polyp of colon  04/11/2023   Combined hyperlipidemia associated with type 2 diabetes mellitus (HCC) 02/12/2023   OSA on CPAP 01/15/2023   Iron deficiency 01/15/2023   Snoring 10/02/2022   Gastroesophageal reflux disease 07/11/2022   Acid indigestion 07/11/2022   Anterior knee pain 02/10/2022   Bursitis of hip 02/10/2022   Lipoma of foot 02/10/2022   Acute bacterial sinusitis 11/18/2021   Impingement syndrome of left shoulder region 02/25/2021   Premature ventricular contractions 08/09/2020   Heart palpitations 07/06/2020   Pure hypercholesterolemia 07/06/2020   Vaginal atrophy 08/16/2018   Rectocele 08/16/2018   Osteoarthritis of knee 10/10/2017   Scoliosis of lumbar spine 07/06/2016   Degeneration of lumbar intervertebral disc 07/06/2016   Cervical arthritis 07/06/2016   Arthralgia of multiple joints 11/12/2014   Allergic rhinitis 11/12/2014   Diabetes mellitus (HCC) 11/12/2014   Hypertension associated with diabetes (HCC) 11/12/2014   Personal history of other drug therapy 11/12/2014   HLD (hyperlipidemia) 11/12/2014   Adult hypothyroidism 11/12/2014   Mild major depression 11/12/2014   Overweight 11/12/2014   Restless leg 11/12/2014   Arthritis of hand 06/30/2014   Carpal tunnel syndrome 06/30/2014   Encounter for screening colonoscopy 04/17/2014   Type 2 diabetes mellitus (HCC) 12/23/2013    PCP: Fredy Bathe, MD  REFERRING PROVIDER: Fredy Bathe  REFERRING DIAG: PF weakness, chronic LBP, DDD lx spine  THERAPY DIAG:  Muscle weakness (generalized) - Plan: PT plan of care cert/re-cert  Other abnormalities of gait and mobility - Plan: PT plan of care cert/re-cert  Abnormal posture - Plan: PT plan of care cert/re-cert  Rectocele - Plan: PT plan of care cert/re-cert  Urgency incontinence - Plan: PT plan of care cert/re-cert  Incontinence of feces with fecal urgency - Plan: PT plan of care cert/re-cert  Rationale for Evaluation and Treatment: Rehabilitation  ONSET DATE: 08/30/23  referral date  SUBJECTIVE:                                                                                                                                                                                           SUBJECTIVE  STATEMENT: She does chair yoga here twice a week. URINARY FUNCTION: she was getting up 4-5/night to void but then had PHPT two years ago and it helped. Now she gets up once/night. Pt voids every few hours. Pt denied pain with urination. Pt has urge incontinence, maybe 1-2/week.  BOWEL FUNCTION: every three days, on ozempic (causes diarrhea) but typically on the verge of constipation. Pt gets maybe 15-20g of fiber. Pt has hemorrhoids and occasionally painful. Pt has fecal leakage when stool is soft, unpredictable. Pt wears pads when this occurs, 2-3/day. Otherwise does not wear pads. Pt does have to use fingers to splint rectocele in via vaginal canal to have BM.  CORE STABILITY: hx of cholecystectomy 04/2023. She feels like she's strong but does have hx of LBP with hx of scoliosis. Back pain occurs with forward bending in 10 minutes pain incr. To 8/10, stops and then pain improves.  SEXUAL FUNCTION: no hx of issues with intercourse, OBGYN exam or tampon insertion. No issues with climaxing.   Fluid intake: 6-8 glasses of liquid (water, tea, coffee), no alcohol    FUNCTIONAL LIMITATIONS: pain during yoga in certain positions, gardening is difficulty   PERTINENT HISTORY:  Medications for current condition: estradiol  2-3/week Surgeries: none Other: HTN, diabetes, OSA with CPAP, rectocele, cystocele, GERD, adenomatous polyp of colon, hypothyroidism, cholecystectomy 05/04/23, HLD, OA B (R is worse but not bad now), scoliosis of lx spine, DDD of lx spine, osteopenia, bursitis of B hip but controlled, impingement syndrome of L shoulder, depression (PPD), B breast augmentation, umbilical hernia repair 05/04/23 Sexual abuse: No  PAIN:  Are you having pain? No NPRS scale:  0/10  PRECAUTIONS: None  RED FLAGS: None   WEIGHT BEARING RESTRICTIONS: No  FALLS:  Has patient fallen in last 6 months? No  OCCUPATION: retired  ACTIVITY LEVEL : gardening, read, paint, sew, move furniture, love building things   PLOF: Independent  PATIENT GOALS: to improve fecal leakage, improved bladder issues, improve back pain   BOWEL MOVEMENT: Pain with bowel movement: Yes Type of bowel movement:Frequency every three days and intermittent diarrhea 2/2 ozempic with fecal leakage Fully empty rectum: Yes:   Leakage: Yes: 2/2 ozempic and has rectocele                                                  Caused by:  Bowel urgency: yes Pads: Yes: 2-3 pads Fiber supplement/laxative No  URINATION: Pain with urination: No Fully empty bladder: Yes:                                           Post-void dribble: No Stream: Strong Urgency: Yes  Frequency:during the day every few hours                                                        Nocturia: Yes: once/night   Leakage: Urge to void and Walking to the bathroom Pads/briefs: No  INTERCOURSE:  Not sexually active.  PREGNANCY: Number of pregnancies: 4 Vaginal deliveries 3 Tearing Yes: big time but does not know degree and  high pressure forceps delivery and tearing with second Episiotomy Yes, lateral episiotomy C-section deliveries 0 Currently pregnant No  PROLAPSE: Pressure and Bulge rectocele and cystocele   OBJECTIVE:  Note: Objective measures were completed at Evaluation unless otherwise noted.     COGNITION: Overall cognitive status: Within functional limits for tasks assessed     SENSATION: Light touch: Appears intact  FUNCTIONAL TESTS:   Single leg stance:  Rt: incr. Postural sway  Lt: incr. Postural sway Sit-up test: Squat: Bed mobility:  GAIT: Assistive device utilized: None Comments: decr. Trunk rot and stride length  POSTURE: R convex tx spine curve, L lx convex curve R shoulder and R  hip elevated in stance, post. Pelvic tilt, incr. Kyposis upper tx spine, FHP   LUMBARAROM/PROM: limited R rot and ext. With pain afterwards.  A/PROM A/PROM  Eval (% available)  Flexion   Extension   Right lateral flexion   Left lateral flexion   Right rotation   Left rotation    (Blank rows = not tested)  LOWER EXTREMITY ROM:  Active ROM Right eval Left eval  Hip flexion    Hip extension    Hip abduction    Hip adduction    Hip internal rotation    Hip external rotation    Knee flexion    Knee extension    Ankle dorsiflexion    Ankle plantarflexion    Ankle inversion    Ankle eversion     (Blank rows = not tested)  LOWER EXTREMITY MMT:  MMT Right eval Left eval  Hip flexion    Hip extension    Hip abduction    Hip adduction    Hip internal rotation    Hip external rotation    Knee flexion    Knee extension    Ankle dorsiflexion    Ankle plantarflexion    Ankle inversion    Ankle eversion     (Blank rows = not tested) PALPATION:  General: no TTP over cx-lx spine in standing.  Pelvic Alignment: post. Pelvic tilt   PELVIC MMT:   MMT eval  Vaginal   Internal Anal Sphincter   External Anal Sphincter   Puborectalis   (Blank rows = not tested)        TONE: limited by time constraints   PROLAPSE: limited by time constraints But hx of rectocele and cystocele per pt  TODAY'S TREATMENT:                                                                                                                              DATE: 11/29/23  EVAL   SELF CARE: PATIENT EDUCATION:  Education details: PT educated pt on main functions of the pelvic floor, IAP, breath and PFM relationship. PT discussed POC, frequency and duration. PT provided the following education: TOILET POSTURE: Urination: feet flat, lean forward with forearms on legs to fully empty bladder. Bowel movement: place feet flat on Squatty Potty or  stool so knees are higher than hips, lean forward to  relax pelvic floor in order to avoid strain.  SHOES: wear supportive shoes, and sandals with straps.  POSTURE: try not to cross legs at knees or ankles. Try the figure four stretch instead.  WATER: start with water first thing in the morning.   PELVIC TILTS: try to stand in neutral, not tucking your tail and not arching back, but in the middle. Bladder diary:   ~Urinating every 2-3 hours ~Urine stream for at least 8 seconds. ~Urine color should be light, lemonade  ~If you have the urge, perform deep breathing for 5 reps and then 5-10 reps of pelvic floor contraction.  ~When do you feel the urge? ~When do you leak? Person educated: Patient Education method: Explanation, Demonstration, and Handouts Education comprehension: verbalized understanding, returned demonstration, and needs further education  HOME EXERCISE PROGRAM: Not yet established  ASSESSMENT:  CLINICAL IMPRESSION: Patient is a pleasant 83 y.o. female who was seen today for physical therapy evaluation and treatment for PF weakness, chronic LBP, and DDD lx spine.  Pt's PMH is significant for the following: HTN, diabetes, OSA with CPAP, rectocele, cystocele, GERD, adenomatous polyp of colon, hypothyroidism, cholecystectomy 05/04/23, HLD, OA B (R is worse but not bad now), scoliosis of lx spine, DDD of lx spine, osteopenia, bursitis of B hip but controlled, impingement syndrome of L shoulder, depression (PPD), B breast augmentation, umbilical hernia repair 05/04/23. The following impairments were noted upon exam: limited ROM, back and shoulder pain, postural dysfunction, decr. Strength likely 2/2 subjective reports and gait deviations, nocturia, urge UI. Pt would benefit from skilled PT to improve safety and decr. Pain during all ADLs.   OBJECTIVE IMPAIRMENTS: Abnormal gait, decreased balance, decreased endurance, decreased mobility, decreased ROM, decreased strength, hypomobility, increased fascial restrictions, postural  dysfunction, and pain.   ACTIVITY LIMITATIONS: carrying, lifting, bending, standing, sleeping, continence, toileting, and locomotion level  PARTICIPATION LIMITATIONS: meal prep, cleaning, laundry, and yard work  PERSONAL FACTORS: Age, Past/current experiences, Time since onset of injury/illness/exacerbation, and 3+ comorbidities: see above are also affecting patient's functional outcome.   REHAB POTENTIAL: Good  CLINICAL DECISION MAKING: Stable/uncomplicated  EVALUATION COMPLEXITY: Low   GOALS: Goals reviewed with patient? Yes  SHORT TERM GOALS: Target date: for all STGs: 12/27/23  Pt will be IND in HEP to improve pain, strength, coordination. Baseline: chair yoga twice a week Goal status: INITIAL  2.  Finish exam and write goals as indicated. Baseline: limited by time constraints Goal status: INITIAL  3.  Pt will demo proper toileting posture to fully empty bladder and reduce straining during bowel movement. Baseline: unable to demo Goal status: INITIAL  4.  Pt will demonstrated improved relaxation and contraction of PFM with coordination of breath to reduce urinary leakage to </=once/week. Baseline: intermittent with urge UI when holding urine too long Goal status: INITIAL    LONG TERM GOALS: Target date: for all LTGs: 01/24/24   Pt will improve hip and core strength to decr. Back pain to </=2/10 at worst when bending forward for >10 minutes. Baseline: 8/10 at worst Goal status: INITIAL  2.  Pt will demonstrated improved relaxation and contraction of PFM with coordination of breath to reduce fecal leakage to </=once/week. Baseline: occurs with soft stool Goal status: INITIAL   PLAN: finish exam (palpation, ROM, MMT, DR) Establish HEP.   PT FREQUENCY: 1x/week  PT DURATION: 8 weeks  PLANNED INTERVENTIONS: [id number]- PT Re-evaluation, 97110-Therapeutic exercises, 97530- Therapeutic activity, [id number]- Neuromuscular re-education, 97535-  Self Care, [id number]- Manual therapy,  [id number]- Gait training, [phone] (1-2 muscles), [id number] (3+ muscles)- Dry Needling, Patient/Family education, Balance training, Taping, Joint mobilization, Spinal mobilization, Scar mobilization, Cryotherapy, Moist heat, and Biofeedback    Dustee Bottenfield L, PT 11/29/2023, 4:45 PM  Delon Pinal, PT,DPT 11/29/23 4:45 PM Phone: [phone] Fax: [phone]

## 2023-11-29 NOTE — Patient Instructions (Signed)
 TOILET POSTURE: Urination: feet flat, lean forward with forearms on legs to fully empty bladder. Bowel movement: place feet flat on Squatty Potty or stool so knees are higher than hips, lean forward to relax pelvic floor in order to avoid strain.  SHOES: wear supportive shoes, and sandals with straps.  POSTURE: try not to cross legs at knees or ankles. Try the figure four stretch instead.  WATER: start with water first thing in the morning.   PELVIC TILTS: try to stand in neutral, not tucking your tail and not arching back, but in the middle.  Bladder diary:   ~Urinating every 2-3 hours ~Urine stream for at least 8 seconds. ~Urine color should be light, lemonade  ~If you have the urge, perform deep breathing for 5 reps and then 5-10 reps of pelvic floor contraction.  ~When do you feel the urge? ~When do you leak?

## 2023-12-03 ENCOUNTER — Telehealth: Payer: Self-pay | Admitting: Internal Medicine

## 2023-12-03 NOTE — Telephone Encounter (Signed)
 Patient left VM stating the boil she is being treated for is getting better but she wants to know if she needs more antibiotics? Please advise.

## 2023-12-04 ENCOUNTER — Other Ambulatory Visit: Payer: Self-pay

## 2023-12-04 ENCOUNTER — Ambulatory Visit

## 2023-12-04 DIAGNOSIS — M6281 Muscle weakness (generalized): Secondary | ICD-10-CM | POA: Diagnosis not present

## 2023-12-04 DIAGNOSIS — N3941 Urge incontinence: Secondary | ICD-10-CM

## 2023-12-04 DIAGNOSIS — R2689 Other abnormalities of gait and mobility: Secondary | ICD-10-CM

## 2023-12-04 DIAGNOSIS — N816 Rectocele: Secondary | ICD-10-CM

## 2023-12-04 DIAGNOSIS — R152 Fecal urgency: Secondary | ICD-10-CM

## 2023-12-04 DIAGNOSIS — R293 Abnormal posture: Secondary | ICD-10-CM

## 2023-12-04 NOTE — Therapy (Signed)
 OUTPATIENT PHYSICAL THERAPY FEMALE PELVIC TREATMENT    Patient Name: Nicole Young MRN: [id number] DOB:[birth year], 83 y.o., female Today's Date: 12/04/2023  END OF SESSION:  PT End of Session - 12/04/23 1408     Visit Number 2    Number of Visits 9    Date for Recertification  01/28/24    Authorization Type Medicare and BCBS    Progress Note Due on Visit 10    PT Start Time 1406   pt late   PT Stop Time 1445    PT Time Calculation (min) 39 min    Activity Tolerance Patient tolerated treatment well;Patient limited by pain    Behavior During Therapy [facility] for tasks assessed/performed          Past Medical History:  Diagnosis Date   Acute bacterial sinusitis 11/18/2021   Allergic rhinitis    AMD (acid maltase deficiency) (HCC)    Anterior knee pain 02/10/2022   Arthralgia of multiple joints 11/12/2014   Biliary dyskinesia 2025   Bursitis of hip 02/10/2022   Carpal tunnel syndrome 06/30/2014   Cervical arthritis 07/06/2016   Cholelithiasis 2025   Complication of anesthesia    slow to wake up in past   Depression    controlled   Diabetes mellitus without complication (HCC)    controlled   GERD (gastroesophageal reflux disease)    Heart palpitations 07/06/2020   Hyperlipidemia    Hypertension    Hypothyroidism    Impingement syndrome of left shoulder 02/25/2021   Iron deficiency 01/15/2023   Lipoma of foot 02/10/2022   Neuropathy    Osteoarthritis    knee, ankles, shoulders, back, neck, hands/fingers, wrists;    Osteoarthritis of knee 10/10/2017   Premature ventricular contractions 08/09/2020   Pure hypercholesterolemia 07/06/2020   Rectocele    Sleep apnea    Thyroid  disease    Vaginal atrophy 08/16/2018   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[facility])    Past Surgical History:  Procedure Laterality Date   AUGMENTATION MAMMAPLASTY  1975   AUGMENTATION MAMMAPLASTY  2017   2nd augmentation and lift   BIOPSY  07/11/2022   Procedure: BIOPSY;  Surgeon: Jinny Carmine,  MD;  Location: [facility] ENDOSCOPY;  Service: Endoscopy;;   BREAST SURGERY     impants replaced with lift   CARPAL TUNNEL RELEASE Right 08/26/2014   Procedure: CARPAL TUNNEL RELEASE;  Surgeon: Kayla Pinal, MD;  Location: [facility] ORS;  Service: Orthopedics;  Laterality: Right;   COLONOSCOPY  2005   Dr Dessa   COLONOSCOPY N/A 06/24/2014   Procedure: COLONOSCOPY;  Surgeon: Reyes LELON Dessa, MD;  Location: [facility] ENDOSCOPY;  Service: Endoscopy;  Laterality: N/A;   COLONOSCOPY N/A 04/11/2023   Procedure: COLONOSCOPY;  Surgeon: Therisa Bi, MD;  Location: [facility] ENDOSCOPY;  Service: Gastroenterology;  Laterality: N/A;  please schedule in am, she is diabetic   ESOPHAGOGASTRODUODENOSCOPY (EGD) WITH PROPOFOL  N/A 07/11/2022   Procedure: ESOPHAGOGASTRODUODENOSCOPY (EGD) WITH PROPOFOL ;  Surgeon: Jinny Carmine, MD;  Location: [facility] ENDOSCOPY;  Service: Endoscopy;  Laterality: N/A;   PARATHYROIDECTOMY  1992   PLACEMENT OF BREAST IMPLANTS  1975   POLYPECTOMY  04/11/2023   Procedure: POLYPECTOMY;  Surgeon: Therisa Bi, MD;  Location: [facility] ENDOSCOPY;  Service: Gastroenterology;;   MARINE, ADENOIDECTOMY, BILATERAL MYRINGOTOMY AND TUBES     UMBILICAL HERNIA REPAIR  04/24/2023   Procedure: REPAIR, HERNIA, UMBILICAL, ADULT;  Surgeon: Rodolph Romano, MD;  Location: [facility] ORS;  Service: General;;   Patient Active Problem List   Diagnosis Date Noted  Cyst of perineum of female 11/27/2023   Incontinence of feces 11/27/2023   Overweight with body mass index (BMI) of 26 to 26.9 in adult 11/27/2023   Cutaneous abscess of perineum 11/27/2023   Vitamin D  deficiency 08/30/2023   Chronic low back pain without sciatica 08/30/2023   Pelvic floor weakness in female 08/30/2023   Osteopenia after menopause 08/30/2023   OSA (obstructive sleep apnea) 07/16/2023   CPAP use counseling 07/16/2023   Acute cholecystitis 04/24/2023   Positive colorectal cancer screening using Cologuard test 04/11/2023   Adenomatous polyp of colon  04/11/2023   Combined hyperlipidemia associated with type 2 diabetes mellitus (HCC) 02/12/2023   OSA on CPAP 01/15/2023   Iron deficiency 01/15/2023   Snoring 10/02/2022   Gastroesophageal reflux disease 07/11/2022   Acid indigestion 07/11/2022   Anterior knee pain 02/10/2022   Bursitis of hip 02/10/2022   Lipoma of foot 02/10/2022   Acute bacterial sinusitis 11/18/2021   Impingement syndrome of left shoulder region 02/25/2021   Premature ventricular contractions 08/09/2020   Heart palpitations 07/06/2020   Pure hypercholesterolemia 07/06/2020   Vaginal atrophy 08/16/2018   Rectocele 08/16/2018   Osteoarthritis of knee 10/10/2017   Scoliosis of lumbar spine 07/06/2016   Degeneration of lumbar intervertebral disc 07/06/2016   Cervical arthritis 07/06/2016   Arthralgia of multiple joints 11/12/2014   Allergic rhinitis 11/12/2014   Diabetes mellitus (HCC) 11/12/2014   Hypertension associated with diabetes (HCC) 11/12/2014   Personal history of other drug therapy 11/12/2014   HLD (hyperlipidemia) 11/12/2014   Adult hypothyroidism 11/12/2014   Mild major depression 11/12/2014   Overweight 11/12/2014   Restless leg 11/12/2014   Arthritis of hand 06/30/2014   Carpal tunnel syndrome 06/30/2014   Encounter for screening colonoscopy 04/17/2014   Type 2 diabetes mellitus (HCC) 12/23/2013    PCP: Fredy Bathe, MD  REFERRING PROVIDER: Fredy Bathe  REFERRING DIAG: PF weakness, chronic LBP, DDD lx spine  THERAPY DIAG:  Muscle weakness (generalized)  Other abnormalities of gait and mobility  Abnormal posture  Rectocele  Urgency incontinence  Incontinence of feces with fecal urgency  Rationale for Evaluation and Treatment: Rehabilitation  ONSET DATE: 08/30/23 referral date  SUBJECTIVE:                                                                                                                                                                                            SUBJECTIVE STATEMENT: Pt reported she has been performing proper toileting posture and overall posture. Pt hadn't gone to the bathroom (BM) in a few days, she noticed rectocele was hard and she pushed it up and has been having  good BMs since that time three days ago.   EVAL: She does chair yoga here twice a week. URINARY FUNCTION: she was getting up 4-5/night to void but then had PHPT two years ago and it helped. Now she gets up once/night. Pt voids every few hours. Pt denied pain with urination. Pt has urge incontinence, maybe 1-2/week.  BOWEL FUNCTION: every three days, on ozempic (causes diarrhea) but typically on the verge of constipation. Pt gets maybe 15-20g of fiber. Pt has hemorrhoids and occasionally painful. Pt has fecal leakage when stool is soft, unpredictable. Pt wears pads when this occurs, 2-3/day. Otherwise does not wear pads. Pt does have to use fingers to splint rectocele in via vaginal canal to have BM.  CORE STABILITY: hx of cholecystectomy 04/2023. She feels like she's strong but does have hx of LBP with hx of scoliosis. Back pain occurs with forward bending in 10 minutes pain incr. To 8/10, stops and then pain improves.  SEXUAL FUNCTION: no hx of issues with intercourse, OBGYN exam or tampon insertion. No issues with climaxing.   Fluid intake: 6-8 glasses of liquid (water, tea, coffee), no alcohol    FUNCTIONAL LIMITATIONS: pain during yoga in certain positions, gardening is difficulty   PERTINENT HISTORY:  Medications for current condition: estradiol  2-3/week Surgeries: none Other: HTN, diabetes, OSA with CPAP, rectocele, cystocele, GERD, adenomatous polyp of colon, hypothyroidism, cholecystectomy 05/04/23, HLD, OA B (R is worse but not bad now), scoliosis of lx spine, DDD of lx spine, osteopenia, bursitis of B hip but controlled, impingement syndrome of L shoulder, depression (PPD), B breast augmentation, umbilical hernia repair 05/04/23 Sexual abuse: No  PAIN:  Are you  having pain? Yes 12/04/23 NPRS scale: 2/10 Abdominal pain-intermittent unsure of the cause (maybe gas pain) Worse: unsure Better: unsure  PRECAUTIONS: None  RED FLAGS: None   WEIGHT BEARING RESTRICTIONS: No  FALLS:  Has patient fallen in last 6 months? No  OCCUPATION: retired  ACTIVITY LEVEL : gardening, read, paint, sew, move furniture, love building things   PLOF: Independent  PATIENT GOALS: to improve fecal leakage, improved bladder issues, improve back pain   BOWEL MOVEMENT: Pain with bowel movement: Yes Type of bowel movement:Frequency every three days and intermittent diarrhea 2/2 ozempic with fecal leakage Fully empty rectum: Yes:   Leakage: Yes: 2/2 ozempic and has rectocele                                                  Caused by:  Bowel urgency: yes Pads: Yes: 2-3 pads Fiber supplement/laxative No  URINATION: Pain with urination: No Fully empty bladder: Yes:                                           Post-void dribble: No Stream: Strong Urgency: Yes  Frequency:during the day every few hours                                                        Nocturia: Yes: once/night   Leakage: Urge to void and Walking to the bathroom Pads/briefs: No  INTERCOURSE:  Not sexually active.  PREGNANCY: Number of pregnancies: 4 Vaginal deliveries 3 Tearing Yes: big time but does not know degree and high pressure forceps delivery and tearing with second Episiotomy Yes, lateral episiotomy C-section deliveries 0 Currently pregnant No  PROLAPSE: Pressure and Bulge rectocele and cystocele   OBJECTIVE:  Note: Objective measures were completed at Evaluation unless otherwise noted.     COGNITION: Overall cognitive status: Within functional limits for tasks assessed     SENSATION: Light touch: Appears intact  FUNCTIONAL TESTS:   Single leg stance:  Rt: incr. Postural sway  Lt: incr. Postural sway Sit-up test: Squat: Bed mobility:  GAIT: Assistive  device utilized: None Comments: decr. Trunk rot and stride length  POSTURE: R convex tx spine curve, L lx convex curve R shoulder and R hip elevated in stance, post. Pelvic tilt, incr. Kyposis upper tx spine, FHP   LUMBARAROM/PROM: limited R rot and ext. With pain afterwards.  A/PROM A/PROM  Eval (% available)  Flexion   Extension   Right lateral flexion   Left lateral flexion   Right rotation   Left rotation    (Blank rows = not tested)  LOWER EXTREMITY ROM: B hip IR limited by approx. 50%  Active ROM Right eval Left eval  Hip flexion    Hip extension    Hip abduction    Hip adduction    Hip internal rotation    Hip external rotation    Knee flexion    Knee extension    Ankle dorsiflexion    Ankle plantarflexion    Ankle inversion    Ankle eversion     (Blank rows = not tested)  LOWER EXTREMITY MMT:  MMT Right eval Left eval  Hip flexion 4+ 4+  Hip extension    Hip abduction 3 3  Hip adduction 3+ 3+  Hip internal rotation limited ROM 2 2  Hip external rotation 4 4  Knee flexion 4+ 4+  Knee extension 5 5  Ankle dorsiflexion 5 5  Ankle plantarflexion    Ankle inversion    Ankle eversion     (Blank rows = not tested) PALPATION:  General: no TTP over cx-lx spine in standing.  Pelvic Alignment: post. Pelvic tilt  11/25: TTP over L distal hamstring and L gastroc, no DR noted, hypomobility of tx spine with hx of scoliosis and ostepenia, limited B hip IR, hip weakness. Incr. Infrasternal rib angle and decr. Lat/post rib translation during diaphragmatic breathing.     PELVIC MMT:   MMT eval  Vaginal   Internal Anal Sphincter   External Anal Sphincter   Puborectalis   (Blank rows = not tested)        TONE: WNL with incr. Tension in L distal hamstring and L proximal gastroc.  PROLAPSE: hx of rectocele and cystocele per pt  TODAY'S TREATMENT:  DATE: 12/04/23   Physical function test: PT completed exam (palpation, MMT, DR, ROM). See above for details.   NMR:  Access Code: [email or web address] URL: [email or web address] Date: 12/04/2023 Prepared by: Delon Pinal  Exercises - Supine Angels  - 1 x daily - 7 x weekly - 1 sets - 10 reps - Sidelying Diaphragmatic Breathing  - 1 x daily - 7 x weekly - 1 sets - 5 reps - Sidelying Open Book  - 1 x daily - 7 x weekly - 1 sets - 10 reps Cues and demo for proper technique. S for safety. No pain reported.   SELF CARE: PATIENT EDUCATION:  Education details: PT educated pt on exam findings, IAP and down regulation to reduce PFM tension to reduce leakage, pain, and prolapse symptoms. Provided pt with HEP. Person educated: Patient Education method: Explanation, Demonstration, and Handouts Education comprehension: verbalized understanding, returned demonstration, and needs further education  HOME EXERCISE PROGRAM: [email or web address]  ASSESSMENT:  CLINICAL IMPRESSION: Skilled session focused on completing exam (difficulty coordinating breath with PFM contraction without glutes, decr. Lat/post Rib translation with inhale, TTP over L LE and hypomobility tx spine, limited hip IR ROM and B hip weakness.). Pt required cues for HEP and progressed to performing IND at end of session. The following impairments were noted upon exam: limited ROM, back and shoulder pain, postural dysfunction, decr. Strength, gait deviations, nocturia, urge UI. Pt would continue to benefit from skilled PT to improve safety and decr. Pain during all ADLs.   OBJECTIVE IMPAIRMENTS: Abnormal gait, decreased balance, decreased endurance, decreased mobility, decreased ROM, decreased strength, hypomobility, increased fascial restrictions, postural dysfunction, and pain.   ACTIVITY LIMITATIONS: carrying, lifting, bending, standing, sleeping, continence, toileting, and locomotion level  PARTICIPATION  LIMITATIONS: meal prep, cleaning, laundry, and yard work  PERSONAL FACTORS: Age, Past/current experiences, Time since onset of injury/illness/exacerbation, and 3+ comorbidities: see above are also affecting patient's functional outcome.   REHAB POTENTIAL: Good  CLINICAL DECISION MAKING: Stable/uncomplicated  EVALUATION COMPLEXITY: Low   GOALS: Goals reviewed with patient? Yes  SHORT TERM GOALS: Target date: for all STGs: 12/27/23  Pt will be IND in HEP to improve pain, strength, coordination. Baseline: chair yoga twice a week Goal status: INITIAL  2.  Finish exam and write goals as indicated. Baseline: limited by time constraints Goal status: MET  3.  Pt will demo proper toileting posture to fully empty bladder and reduce straining during bowel movement. Baseline: unable to demo Goal status: INITIAL  4.  Pt will demonstrated improved relaxation and contraction of PFM with coordination of breath to reduce urinary leakage to </=once/week. Baseline: intermittent with urge UI when holding urine too long Goal status: INITIAL    LONG TERM GOALS: Target date: for all LTGs: 01/24/24   Pt will improve hip and core strength to decr. Back pain to </=2/10 at worst when bending forward for >10 minutes. Baseline: 8/10 at worst Goal status: INITIAL  2.  Pt will demonstrated improved relaxation and contraction of PFM with coordination of breath to reduce fecal leakage to </=once/week. Baseline: occurs with soft stool Goal status: INITIAL   PLAN: review and progress HEP. Scar. Mob (ab)   PT FREQUENCY: 1x/week  PT DURATION: 8 weeks  PLANNED INTERVENTIONS: 97164- PT Re-evaluation, 97110-Therapeutic exercises, 97530- Therapeutic activity, 97112- Neuromuscular re-education, 97535- Self Care, [id number]- Manual therapy, [phone]- Gait training, [phone] (1-2 muscles), 20561 (3+ muscles)- Dry Needling, Patient/Family education, Balance training, Taping, Joint mobilization, Spinal mobilization, Scar  mobilization, Cryotherapy, Moist heat, and Biofeedback  Zilla Shartzer L, PT 12/04/2023, 2:09 PM  Delon Pinal, PT,DPT 12/04/23 2:09 PM Phone: [phone] Fax: [phone]

## 2023-12-05 ENCOUNTER — Other Ambulatory Visit: Payer: Self-pay

## 2023-12-05 ENCOUNTER — Encounter: Payer: Self-pay | Admitting: Emergency Medicine

## 2023-12-05 ENCOUNTER — Emergency Department
Admission: EM | Admit: 2023-12-05 | Discharge: 2023-12-05 | Disposition: A | Discharge status: Home / Self Care | Attending: Emergency Medicine | Admitting: Emergency Medicine

## 2023-12-05 ENCOUNTER — Emergency Department

## 2023-12-05 DIAGNOSIS — S0990XA Unspecified injury of head, initial encounter: Secondary | ICD-10-CM | POA: Diagnosis present

## 2023-12-05 DIAGNOSIS — I10 Essential (primary) hypertension: Secondary | ICD-10-CM | POA: Insufficient documentation

## 2023-12-05 DIAGNOSIS — R55 Syncope and collapse: Secondary | ICD-10-CM | POA: Insufficient documentation

## 2023-12-05 DIAGNOSIS — W06XXXA Fall from bed, initial encounter: Secondary | ICD-10-CM | POA: Diagnosis not present

## 2023-12-05 DIAGNOSIS — E119 Type 2 diabetes mellitus without complications: Secondary | ICD-10-CM | POA: Insufficient documentation

## 2023-12-05 DIAGNOSIS — S0003XA Contusion of scalp, initial encounter: Secondary | ICD-10-CM | POA: Diagnosis not present

## 2023-12-05 LAB — CBC WITH DIFFERENTIAL/PLATELET
Abs Immature Granulocytes: 0.07 K/uL (ref 0.00–0.07)
Basophils Absolute: 0 K/uL (ref 0.0–0.1)
Basophils Relative: 0 %
Eosinophils Absolute: 0 K/uL (ref 0.0–0.5)
Eosinophils Relative: 0 %
HCT: 38.8 % (ref 36.0–46.0)
Hemoglobin: 13 g/dL (ref 12.0–15.0)
Immature Granulocytes: 1 %
Lymphocytes Relative: 9 %
Lymphs Abs: 1.2 K/uL (ref 0.7–4.0)
MCH: 30.3 pg (ref 26.0–34.0)
MCHC: 33.5 g/dL (ref 30.0–36.0)
MCV: 90.4 fL (ref 80.0–100.0)
Monocytes Absolute: 0.8 K/uL (ref 0.1–1.0)
Monocytes Relative: 6 %
Neutro Abs: 11.6 K/uL — ABNORMAL HIGH (ref 1.7–7.7)
Neutrophils Relative %: 84 %
Platelets: 205 K/uL (ref 150–400)
RBC: 4.29 MIL/uL (ref 3.87–5.11)
RDW: 14.5 % (ref 11.5–15.5)
WBC: 13.7 K/uL — ABNORMAL HIGH (ref 4.0–10.5)
nRBC: 0 % (ref 0.0–0.2)

## 2023-12-05 LAB — COMPREHENSIVE METABOLIC PANEL WITH GFR
ALT: 30 U/L (ref 0–44)
AST: 36 U/L (ref 15–41)
Albumin: 4.3 g/dL (ref 3.5–5.0)
Alkaline Phosphatase: 43 U/L (ref 38–126)
Anion gap: 11 (ref 5–15)
BUN: 26 mg/dL — ABNORMAL HIGH (ref 8–23)
CO2: 23 mmol/L (ref 22–32)
Calcium: 9 mg/dL (ref 8.9–10.3)
Chloride: 104 mmol/L (ref 98–111)
Creatinine, Ser: 0.82 mg/dL (ref 0.44–1.00)
GFR, Estimated: >60 mL/min (ref >60)
Glucose, Bld: 119 mg/dL — ABNORMAL HIGH (ref 70–99)
Potassium: 4.2 mmol/L (ref 3.5–5.1)
Sodium: 137 mmol/L (ref 135–145)
Total Bilirubin: 0.4 mg/dL (ref 0.0–1.2)
Total Protein: 6.8 g/dL (ref 6.5–8.1)

## 2023-12-05 LAB — URINALYSIS, ROUTINE W REFLEX MICROSCOPIC
Bilirubin Urine: NEGATIVE
Glucose, UA: NEGATIVE mg/dL
Hgb urine dipstick: NEGATIVE
Ketones, ur: NEGATIVE mg/dL
Leukocytes,Ua: NEGATIVE
Nitrite: NEGATIVE
Protein, ur: NEGATIVE mg/dL
Specific Gravity, Urine: 1.013 (ref 1.005–1.030)
pH: 6 (ref 5.0–8.0)

## 2023-12-05 LAB — C DIFFICILE QUICK SCREEN W PCR REFLEX
C Diff antigen: POSITIVE — AB
C Diff toxin: NEGATIVE

## 2023-12-05 LAB — CLOSTRIDIUM DIFFICILE BY PCR, REFLEXED
Hypervirulent Strain: NEGATIVE
Toxigenic C. Difficile by PCR: POSITIVE — AB

## 2023-12-05 LAB — TROPONIN T, HIGH SENSITIVITY
Troponin T High Sensitivity: <15 ng/L (ref 0–19)
Troponin T High Sensitivity: <15 ng/L (ref 0–19)

## 2023-12-05 LAB — LIPASE, BLOOD: Lipase: 63 U/L — ABNORMAL HIGH (ref 11–51)

## 2023-12-05 MED ORDER — KETOROLAC TROMETHAMINE 30 MG/ML IJ SOLN
15.0000 mg | Freq: Once | INTRAMUSCULAR | Status: AC
  Administered 2023-12-05: 15 mg via INTRAVENOUS
  Filled 2023-12-05: qty 1

## 2023-12-05 MED ORDER — ONDANSETRON HCL 4 MG/2ML IJ SOLN
4.0000 mg | Freq: Once | INTRAMUSCULAR | Status: AC
  Administered 2023-12-05: 4 mg via INTRAVENOUS
  Filled 2023-12-05: qty 2

## 2023-12-05 MED ORDER — LACTATED RINGERS IV BOLUS
1000.0000 mL | Freq: Once | INTRAVENOUS | Status: AC
  Administered 2023-12-05: 1000 mL via INTRAVENOUS

## 2023-12-05 MED ORDER — ACETAMINOPHEN 500 MG PO TABS
1000.0000 mg | ORAL_TABLET | Freq: Once | ORAL | Status: AC
  Administered 2023-12-05: 1000 mg via ORAL
  Filled 2023-12-05: qty 2

## 2023-12-05 NOTE — ED Triage Notes (Addendum)
 Pt via POV from home. Pt c/o syncopal episode this AM around 0600. States that she has been nauseous intermittently throughout the night. Reports she was getting out of bed, then woke up on the floor. Reports some pain the R side of her head. Denies any blood thinners. Pt is A&Ox4 and NAD, ambulatory to triage.

## 2023-12-05 NOTE — ED Notes (Signed)
 Called CCMD to add pt to monitoring.

## 2023-12-05 NOTE — ED Notes (Signed)
 Pt ambulated to in room toilet without assistance of staff. Pt denies lightheadedness/dizziness at this time. Pt ambulated back to bed. Pt put back on VS monitoring and bed in lowest locked position, call bell in reach.

## 2023-12-05 NOTE — ED Notes (Addendum)
 Pt ambulated to in room toilet without assistance of staff. Pt denies lightheadedness/dizziness at this time. Pt ambulated back to bed. Pt put back on VS monitoring and bed in lowest locked position, call bell in reach.

## 2023-12-05 NOTE — ED Provider Notes (Signed)
 [facility] Provider Note    Event Date/Time   First MD Initiated Contact with Patient 12/05/23 0805     (approximate)   History   Loss of Consciousness   HPI  Nicole Young is a 83 y.o. female who presents to the ED for evaluation of Loss of Consciousness   Review of PCP visit from 8 days ago.  History of HTN, HLD, DM on GLP-1.  Started on Augmentin  for a perianal boil  Patient reports a syncopal episode that occurred earlier this morning while getting up out of bed at 6 AM.  Patient reports this happens every so often ever since I was a teenager.  She does report 1 large profuse episode of watery diarrhea this morning prior to this episode.  No emesis or abdominal pain or urinary changes.  Reports a knot in the back of her head but no other areas of pain or trauma   Physical Exam   Triage Vital Signs: ED Triage Vitals  Encounter Vitals Group     BP 12/05/23 0804 137/62     Girls Systolic BP Percentile --      Girls Diastolic BP Percentile --      Boys Systolic BP Percentile --      Boys Diastolic BP Percentile --      Pulse Rate 12/05/23 0804 73     Resp 12/05/23 0804 18     Temp 12/05/23 0804 97.8 F (36.6 C)     Temp Source 12/05/23 0804 Oral     SpO2 12/05/23 0804 98 %     Weight 12/05/23 0802 133 lb (60.3 kg)     Height 12/05/23 0802 5' (1.524 m)     Head Circumference --      Peak Flow --      Pain Score 12/05/23 0801 4     Pain Loc --      Pain Education --      Exclude from Growth Chart --     Most recent vital signs: Vitals:   12/05/23 0930 12/05/23 1030  BP: 131/60 136/61  Pulse: 72 74  Resp: (!) 23 15  Temp:    SpO2: 95% 94%    General: Awake, no distress.  Well-appearing and conversational. CV:  Good peripheral perfusion.  Resp:  Normal effort.  Abd:  No distention.  Soft and nontender MSK:  No deformity noted.  Palpation of all 4 extremities without evidence of deformity, tenderness or trauma. Right sided  occiput with a closed hematoma that is mildly tender Neuro:  No focal deficits appreciated. Cranial nerves II through XII intact 5/5 strength and sensation in all 4 extremities Other:  Chaperoned anorectal external exam with bedside RN without significant abscess, cellulitis to necessitate I&D.   ED Results / Procedures / Treatments   Labs (all labs ordered are listed, but only abnormal results are displayed) Labs Reviewed  C DIFFICILE QUICK SCREEN W PCR REFLEX   - Abnormal; Notable for the following components:      Result Value   C Diff antigen POSITIVE (*)    All other components within normal limits  CLOSTRIDIUM DIFFICILE BY PCR, REFLEXED - Abnormal; Notable for the following components:   Toxigenic C. Difficile by PCR POSITIVE (*)    All other components within normal limits  COMPREHENSIVE METABOLIC PANEL WITH GFR - Abnormal; Notable for the following components:   Glucose, Bld 119 (*)    BUN 26 (*)    All other  components within normal limits  CBC WITH DIFFERENTIAL/PLATELET - Abnormal; Notable for the following components:   WBC 13.7 (*)    Neutro Abs 11.6 (*)    All other components within normal limits  URINALYSIS, ROUTINE W REFLEX MICROSCOPIC - Abnormal; Notable for the following components:   Color, Urine STRAW (*)    APPearance CLEAR (*)    All other components within normal limits  LIPASE, BLOOD - Abnormal; Notable for the following components:   Lipase 63 (*)    All other components within normal limits  TROPONIN T, HIGH SENSITIVITY  TROPONIN T, HIGH SENSITIVITY    EKG Sinus rhythm with a rate of 75 bpm.  Normal axis and intervals, QTc 406, no for signs of acute ischemia.  RADIOLOGY CT head interpreted by me without evidence of acute intracranial pathology CT cervical spine interpreted by me without evidence of fracture or dislocation  Official radiology report(s): CT Cervical Spine Wo Contrast Result Date: 12/05/2023 EXAM: CT CERVICAL SPINE WITHOUT  CONTRAST 12/05/2023 09:10:33 AM TECHNIQUE: CT of the cervical spine was performed without the administration of intravenous contrast. Multiplanar reformatted images are provided for review. Automated exposure control, iterative reconstruction, and/or weight based adjustment of the mA/kV was utilized to reduce the radiation dose to as low as reasonably achievable. COMPARISON: None available. CLINICAL HISTORY: fall fall FINDINGS: CERVICAL SPINE: BONES AND ALIGNMENT: There is mild reversal of the normal cervical lordosis. There is slight degenerative anterolisthesis at C3-C4. There is slight degenerative anterolisthesis at C7-T1. No acute fracture or traumatic malalignment. DEGENERATIVE CHANGES: At C3-C4, there is slight degenerative anterolisthesis with moderate bilateral facet arthrosis. At C4-C5, there is moderate-to-severe right-sided spinal canal stenosis secondary to uncovertebral joint hypertrophy and facet arthrosis, and there is disc space narrowing and diffuse posterior endplate ridging and facet arthropathy, resulting in mild central spinal canal stenosis and moderate-to-severe bilateral neural foraminal stenosis. At C5-C6, there is broad-based bulging disc osteophyte complex causing moderate central spinal canal stenosis and moderate bilateral neural foraminal stenosis. At C6-C7, there is diffuse endplate ridging causing mild-to-moderate central spinal canal stenosis and mild-to-moderate bilateral neural foraminal stenosis. At C7-T1, there is slight degenerative anterolisthesis. The spinal canal and neural foramina are patent. SOFT TISSUES: No prevertebral soft tissue swelling. IMPRESSION: 1. No acute abnormality of the cervical spine related to the fall. 2. 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 3.  Moderate central spinal canal stenosis and moderate bilateral neural foraminal stenosis at C5-6 due to broad-based bulging disc osteophyte complex. 4. Mild-to-moderate central spinal canal stenosis and mild-to-moderate bilateral neural foraminal stenosis at C6-7 due to diffuse endplate ridging. Electronically signed by: Evalene Coho MD 12/05/2023 09:20 AM EST RP Workstation: [id number]   CT HEAD WO CONTRAST ( ) Result Date: 12/05/2023 EXAM: CT HEAD WITHOUT CONTRAST 12/05/2023 09:10:33 AM TECHNIQUE: CT of the head was performed without the administration of intravenous contrast. Automated exposure control, iterative reconstruction, and/or weight based adjustment of the mA/kV was utilized to reduce the radiation dose to as low as reasonably achievable. COMPARISON: CT of the head dated 06/30/2010. CLINICAL HISTORY: fall, right posterior hematoma. eval fx, ich FINDINGS: BRAIN AND VENTRICLES: No acute hemorrhage. No evidence of acute infarct. No hydrocephalus. No extra-axial collection. No mass effect or midline shift. There is age commensurate cerebral volume loss present and mild periventricular white matter disease. ORBITS: No acute abnormality. SINUSES: No acute abnormality. SOFT  TISSUES AND SKULL: There is a right parietal scalp hematoma. The calvaria is intact. No skull fracture. IMPRESSION: 1. No acute intracranial abnormality. 2. Right parietal scalp hematoma. Electronically signed by: Evalene Coho MD 12/05/2023 09:16 AM EST RP Workstation: [id number]    PROCEDURES and INTERVENTIONS:  Procedures  Medications  lactated ringers  bolus 1,000 mL (1,000 mLs Intravenous New Bag/Given 12/05/23 0841)  ondansetron  (ZOFRAN ) injection 4 mg (4 mg Intravenous Given 12/05/23 0838)  ketorolac  (TORADOL ) 30 MG/ML injection 15 mg (15 mg Intravenous Given 12/05/23 1013)  acetaminophen  (TYLENOL ) tablet 1,000 mg (1,000 mg Oral Given 12/05/23 1013)     IMPRESSION / MDM / ASSESSMENT AND PLAN / ED COURSE  I  reviewed the triage vital signs and the nursing notes.  Differential diagnosis includes, but is not limited to, dehydration, AKI, vasovagal episode, cardiac dysrhythmia, seizure, C. difficile, medication side effect  {Patient presents with symptoms of an acute illness or injury that is potentially life-threatening.  Patient presents to the ED after syncopal episode that sounds vasovagal in nature.  Will provide IV fluids, antiemetics as we work her up.  Reassuring vital signs and exam.  Small closed hematoma otherwise normal exam.  Will check basic labs, urine, CT imaging of the head and neck and reassess.  Advised the patient if she does have additional diarrhea we can screen for C. difficile while she is here and she is agreeable.  Able to do stool sample and negative for active C. difficile as no toxin production, only antigen positive.  She is quite well-appearing after fluid resuscitation and I believe suitable for outpatient management.  Discussed ED return precautions  Clinical Course as of 12/05/23 1115  Wed Dec 05, 2023  1104 Reassessed and feeling much better. [DS]    Clinical Course User Index [DS] Claudene Rover, MD     FINAL CLINICAL IMPRESSION(S) / ED DIAGNOSES   Final diagnoses:  Syncope and collapse  Vasovagal episode     Rx / DC Orders   ED Discharge Orders     None        Note:  This document was prepared using Dragon voice recognition software and may include unintentional dictation errors.   Claudene Rover, MD 12/05/23 1115

## 2023-12-11 ENCOUNTER — Other Ambulatory Visit: Payer: Self-pay

## 2023-12-11 ENCOUNTER — Ambulatory Visit: Attending: Internal Medicine

## 2023-12-11 DIAGNOSIS — R152 Fecal urgency: Secondary | ICD-10-CM | POA: Diagnosis present

## 2023-12-11 DIAGNOSIS — M6281 Muscle weakness (generalized): Secondary | ICD-10-CM | POA: Diagnosis present

## 2023-12-11 DIAGNOSIS — R2689 Other abnormalities of gait and mobility: Secondary | ICD-10-CM | POA: Insufficient documentation

## 2023-12-11 DIAGNOSIS — N3941 Urge incontinence: Secondary | ICD-10-CM | POA: Diagnosis present

## 2023-12-11 DIAGNOSIS — N816 Rectocele: Secondary | ICD-10-CM | POA: Insufficient documentation

## 2023-12-11 DIAGNOSIS — R159 Full incontinence of feces: Secondary | ICD-10-CM | POA: Insufficient documentation

## 2023-12-11 DIAGNOSIS — R293 Abnormal posture: Secondary | ICD-10-CM | POA: Insufficient documentation

## 2023-12-11 NOTE — Therapy (Signed)
 OUTPATIENT PHYSICAL THERAPY FEMALE PELVIC TREATMENT    Patient Name: Nicole Young MRN: [id number] DOB:10/03/1940, 83 y.o., female Today's Date: 12/11/2023  END OF SESSION:  PT End of Session - 12/11/23 1536     Visit Number 3    Number of Visits 9    Date for Recertification  01/28/24    Authorization Type Medicare and BCBS    Progress Note Due on Visit 10    PT Start Time 1533    PT Stop Time 1613    PT Time Calculation (min) 40 min    Activity Tolerance Patient tolerated treatment well;Patient limited by pain    Behavior During Therapy [facility] for tasks assessed/performed          Past Medical History:  Diagnosis Date   Acute bacterial sinusitis 11/18/2021   Allergic rhinitis    AMD (acid maltase deficiency) (HCC)    Anterior knee pain 02/10/2022   Arthralgia of multiple joints 11/12/2014   Biliary dyskinesia 2025   Bursitis of hip 02/10/2022   Carpal tunnel syndrome 06/30/2014   Cervical arthritis 07/06/2016   Cholelithiasis 2025   Complication of anesthesia    slow to wake up in past   Depression    controlled   Diabetes mellitus without complication (HCC)    controlled   GERD (gastroesophageal reflux disease)    Heart palpitations 07/06/2020   Hyperlipidemia    Hypertension    Hypothyroidism    Impingement syndrome of left shoulder 02/25/2021   Iron deficiency 01/15/2023   Lipoma of foot 02/10/2022   Neuropathy    Osteoarthritis    knee, ankles, shoulders, back, neck, hands/fingers, wrists;    Osteoarthritis of knee 10/10/2017   Premature ventricular contractions 08/09/2020   Pure hypercholesterolemia 07/06/2020   Rectocele    Sleep apnea    Thyroid  disease    Vaginal atrophy 08/16/2018   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[facility])    Past Surgical History:  Procedure Laterality Date   AUGMENTATION MAMMAPLASTY  1975   AUGMENTATION MAMMAPLASTY  2017   2nd augmentation and lift   BIOPSY  07/11/2022   Procedure: BIOPSY;  Surgeon: Jinny Carmine, MD;   Location: [facility] ENDOSCOPY;  Service: Endoscopy;;   BREAST SURGERY     impants replaced with lift   CARPAL TUNNEL RELEASE Right 08/26/2014   Procedure: CARPAL TUNNEL RELEASE;  Surgeon: Kayla Pinal, MD;  Location: [facility] ORS;  Service: Orthopedics;  Laterality: Right;   COLONOSCOPY  2005   Dr Dessa   COLONOSCOPY N/A 06/24/2014   Procedure: COLONOSCOPY;  Surgeon: Reyes LELON Dessa, MD;  Location: [facility] ENDOSCOPY;  Service: Endoscopy;  Laterality: N/A;   COLONOSCOPY N/A 04/11/2023   Procedure: COLONOSCOPY;  Surgeon: Therisa Bi, MD;  Location: [facility] ENDOSCOPY;  Service: Gastroenterology;  Laterality: N/A;  please schedule in am, she is diabetic   ESOPHAGOGASTRODUODENOSCOPY (EGD) WITH PROPOFOL  N/A 07/11/2022   Procedure: ESOPHAGOGASTRODUODENOSCOPY (EGD) WITH PROPOFOL ;  Surgeon: Jinny Carmine, MD;  Location: [facility] ENDOSCOPY;  Service: Endoscopy;  Laterality: N/A;   PARATHYROIDECTOMY  1992   PLACEMENT OF BREAST IMPLANTS  1975   POLYPECTOMY  04/11/2023   Procedure: POLYPECTOMY;  Surgeon: Therisa Bi, MD;  Location: [facility] ENDOSCOPY;  Service: Gastroenterology;;   MARINE, ADENOIDECTOMY, BILATERAL MYRINGOTOMY AND TUBES     UMBILICAL HERNIA REPAIR  04/24/2023   Procedure: REPAIR, HERNIA, UMBILICAL, ADULT;  Surgeon: Rodolph Romano, MD;  Location: [facility] ORS;  Service: General;;   Patient Active Problem List   Diagnosis Date Noted   Cyst of perineum  of female 11/27/2023   Incontinence of feces 11/27/2023   Overweight with body mass index (BMI) of 26 to 26.9 in adult 11/27/2023   Cutaneous abscess of perineum 11/27/2023   Vitamin D  deficiency 08/30/2023   Chronic low back pain without sciatica 08/30/2023   Pelvic floor weakness in female 08/30/2023   Osteopenia after menopause 08/30/2023   OSA (obstructive sleep apnea) 07/16/2023   CPAP use counseling 07/16/2023   Acute cholecystitis 04/24/2023   Positive colorectal cancer screening using Cologuard test 04/11/2023   Adenomatous polyp of colon  04/11/2023   Combined hyperlipidemia associated with type 2 diabetes mellitus (HCC) 02/12/2023   OSA on CPAP 01/15/2023   Iron deficiency 01/15/2023   Snoring 10/02/2022   Gastroesophageal reflux disease 07/11/2022   Acid indigestion 07/11/2022   Anterior knee pain 02/10/2022   Bursitis of hip 02/10/2022   Lipoma of foot 02/10/2022   Acute bacterial sinusitis 11/18/2021   Impingement syndrome of left shoulder region 02/25/2021   Premature ventricular contractions 08/09/2020   Heart palpitations 07/06/2020   Pure hypercholesterolemia 07/06/2020   Vaginal atrophy 08/16/2018   Rectocele 08/16/2018   Osteoarthritis of knee 10/10/2017   Scoliosis of lumbar spine 07/06/2016   Degeneration of lumbar intervertebral disc 07/06/2016   Cervical arthritis 07/06/2016   Arthralgia of multiple joints 11/12/2014   Allergic rhinitis 11/12/2014   Diabetes mellitus (HCC) 11/12/2014   Hypertension associated with diabetes (HCC) 11/12/2014   Personal history of other drug therapy 11/12/2014   HLD (hyperlipidemia) 11/12/2014   Adult hypothyroidism 11/12/2014   Mild major depression 11/12/2014   Overweight 11/12/2014   Restless leg 11/12/2014   Arthritis of hand 06/30/2014   Carpal tunnel syndrome 06/30/2014   Encounter for screening colonoscopy 04/17/2014   Type 2 diabetes mellitus (HCC) 12/23/2013    PCP: Fredy Bathe, MD  REFERRING PROVIDER: Fredy Bathe  REFERRING DIAG: PF weakness, chronic LBP, DDD lx spine  THERAPY DIAG:  Muscle weakness (generalized)  Other abnormalities of gait and mobility  Abnormal posture  Rectocele  Urgency incontinence  Incontinence of feces with fecal urgency  Rationale for Evaluation and Treatment: Rehabilitation  ONSET DATE: 08/30/23 referral date  SUBJECTIVE:                                                                                                                                                                                            SUBJECTIVE STATEMENT: Pt reported she had a syncopal episode, went to ED and was cleared. Had diarrhea and has hx of passing out as a teenager with bad cramps, diarrhea or nausea. Has not performed HEP 2/2 syncopal spell.  EVAL: She  does chair yoga here twice a week. URINARY FUNCTION: she was getting up 4-5/night to void but then had PHPT two years ago and it helped. Now she gets up once/night. Pt voids every few hours. Pt denied pain with urination. Pt has urge incontinence, maybe 1-2/week.  BOWEL FUNCTION: every three days, on ozempic (causes diarrhea) but typically on the verge of constipation. Pt gets maybe 15-20g of fiber. Pt has hemorrhoids and occasionally painful. Pt has fecal leakage when stool is soft, unpredictable. Pt wears pads when this occurs, 2-3/day. Otherwise does not wear pads. Pt does have to use fingers to splint rectocele in via vaginal canal to have BM.  CORE STABILITY: hx of cholecystectomy 04/2023. She feels like she's strong but does have hx of LBP with hx of scoliosis. Back pain occurs with forward bending in 10 minutes pain incr. To 8/10, stops and then pain improves.  SEXUAL FUNCTION: no hx of issues with intercourse, OBGYN exam or tampon insertion. No issues with climaxing.   Fluid intake: 6-8 glasses of liquid (water, tea, coffee), no alcohol    FUNCTIONAL LIMITATIONS: pain during yoga in certain positions, gardening is difficulty   PERTINENT HISTORY:  Medications for current condition: estradiol  2-3/week Surgeries: none Other: HTN, diabetes, OSA with CPAP, rectocele, cystocele, GERD, adenomatous polyp of colon, hypothyroidism, cholecystectomy 05/04/23, HLD, OA B (R is worse but not bad now), scoliosis of lx spine, DDD of lx spine, osteopenia, bursitis of B hip but controlled, impingement syndrome of L shoulder, depression (PPD), B breast augmentation, umbilical hernia repair 05/04/23 Sexual abuse: No  PAIN:  Are you having pain? Yes 12/11/23 NPRS scale:  1/10 LBP Worse: standing for prolonged periods of time, leaning forward Better: resting   PRECAUTIONS: None  RED FLAGS: None   WEIGHT BEARING RESTRICTIONS: No  FALLS:  Has patient fallen in last 6 months? No  OCCUPATION: retired  ACTIVITY LEVEL : gardening, read, paint, sew, move furniture, love building things   PLOF: Independent  PATIENT GOALS: to improve fecal leakage, improved bladder issues, improve back pain   BOWEL MOVEMENT: Pain with bowel movement: Yes Type of bowel movement:Frequency every three days and intermittent diarrhea 2/2 ozempic with fecal leakage Fully empty rectum: Yes:   Leakage: Yes: 2/2 ozempic and has rectocele                                                  Caused by:  Bowel urgency: yes Pads: Yes: 2-3 pads Fiber supplement/laxative No  URINATION: Pain with urination: No Fully empty bladder: Yes:                                           Post-void dribble: No Stream: Strong Urgency: Yes  Frequency:during the day every few hours                                                        Nocturia: Yes: once/night   Leakage: Urge to void and Walking to the bathroom Pads/briefs: No  INTERCOURSE:  Not sexually active.  PREGNANCY: Number of pregnancies: 4 Vaginal  deliveries 3 Tearing Yes: big time but does not know degree and high pressure forceps delivery and tearing with second Episiotomy Yes, lateral episiotomy C-section deliveries 0 Currently pregnant No  PROLAPSE: Pressure and Bulge rectocele and cystocele   OBJECTIVE:  Note: Objective measures were completed at Evaluation unless otherwise noted.     COGNITION: Overall cognitive status: Within functional limits for tasks assessed     SENSATION: Light touch: Appears intact  FUNCTIONAL TESTS:   Single leg stance:  Rt: incr. Postural sway  Lt: incr. Postural sway Sit-up test: Squat: Bed mobility:  GAIT: Assistive device utilized: None Comments: decr. Trunk rot and  stride length  POSTURE: R convex tx spine curve, L lx convex curve R shoulder and R hip elevated in stance, post. Pelvic tilt, incr. Kyposis upper tx spine, FHP   LUMBARAROM/PROM: limited R rot and ext. With pain afterwards.  A/PROM A/PROM  Eval (% available)  Flexion   Extension   Right lateral flexion   Left lateral flexion   Right rotation   Left rotation    (Blank rows = not tested)  LOWER EXTREMITY ROM: B hip IR limited by approx. 50%  Active ROM Right eval Left eval  Hip flexion    Hip extension    Hip abduction    Hip adduction    Hip internal rotation    Hip external rotation    Knee flexion    Knee extension    Ankle dorsiflexion    Ankle plantarflexion    Ankle inversion    Ankle eversion     (Blank rows = not tested)  LOWER EXTREMITY MMT:  MMT Right eval Left eval  Hip flexion 4+ 4+  Hip extension    Hip abduction 3 3  Hip adduction 3+ 3+  Hip internal rotation limited ROM 2 2  Hip external rotation 4 4  Knee flexion 4+ 4+  Knee extension 5 5  Ankle dorsiflexion 5 5  Ankle plantarflexion    Ankle inversion    Ankle eversion     (Blank rows = not tested) PALPATION:  General: no TTP over cx-lx spine in standing.  Pelvic Alignment: post. Pelvic tilt  11/25: TTP over L distal hamstring and L gastroc, no DR noted, hypomobility of tx spine with hx of scoliosis and ostepenia, limited B hip IR, hip weakness. Incr. Infrasternal rib angle and decr. Lat/post rib translation during diaphragmatic breathing.     PELVIC MMT:   MMT eval  Vaginal   Internal Anal Sphincter   External Anal Sphincter   Puborectalis   (Blank rows = not tested)        TONE: WNL with incr. Tension in L distal hamstring and L proximal gastroc.  PROLAPSE: hx of rectocele and cystocele per pt  TODAY'S TREATMENT:  DATE: 12/11/23   NMR:  Access  Code: [email or web address] URL: [email or web address] Date: 12/11/2023 Prepared by: Delon Pinal  - Dead Bug  - 1 x daily - 3 x weekly - 4 sets - 5 reps, performing chest press and overhead reach with 3 pound weight separt - Clamshell with Resistance  - 1 x daily - 3 x weekly - 3 sets - 10 reps - Mini Squat with Counter Support  - 1 x daily - 3 x weekly - 3 sets - 10 reps - Supine Double Knee to Chest  - 1 x daily - 7 x weekly - 1 sets - 3 reps - 30 hold in b/t clams and dead bugs Cues and demo for proper technique. S for safety. L shoulder pain with overhead movement, modified and it decr. L hip pain at proximal glute med attachment but not enough for pt to cease exercise. PT reported LBP decr. With DKTC and rocking stretch.   SELF CARE: PATIENT EDUCATION:  Education details: PT educated progressing HEP safely. Person educated: Patient Education method: Explanation, Demonstration, and Handouts Education comprehension: verbalized understanding, returned demonstration, and needs further education  HOME EXERCISE PROGRAM: [email or web address]  ASSESSMENT:  CLINICAL IMPRESSION: Skilled session focused on progressing HEP to include strengthening to stabilize and strength hip, core and LE musculature to decr. PFM strain and tension. Pt required cues to coordination with breath work but progressed to performing IND. The following impairments were noted upon exam: limited ROM, back and shoulder pain, postural dysfunction, decr. Strength, gait deviations, nocturia, urge UI. Pt would continue to benefit from skilled PT to improve safety and decr. Pain during all ADLs.   OBJECTIVE IMPAIRMENTS: Abnormal gait, decreased balance, decreased endurance, decreased mobility, decreased ROM, decreased strength, hypomobility, increased fascial restrictions, postural dysfunction, and pain.   ACTIVITY LIMITATIONS: carrying, lifting, bending, standing, sleeping, continence, toileting, and locomotion  level  PARTICIPATION LIMITATIONS: meal prep, cleaning, laundry, and yard work  PERSONAL FACTORS: Age, Past/current experiences, Time since onset of injury/illness/exacerbation, and 3+ comorbidities: see above are also affecting patient's functional outcome.   REHAB POTENTIAL: Good  CLINICAL DECISION MAKING: Stable/uncomplicated  EVALUATION COMPLEXITY: Low   GOALS: Goals reviewed with patient? Yes  SHORT TERM GOALS: Target date: for all STGs: 12/27/23  Pt will be IND in HEP to improve pain, strength, coordination. Baseline: chair yoga twice a week Goal status: INITIAL  2.  Finish exam and write goals as indicated. Baseline: limited by time constraints Goal status: MET  3.  Pt will demo proper toileting posture to fully empty bladder and reduce straining during bowel movement. Baseline: unable to demo Goal status: INITIAL  4.  Pt will demonstrated improved relaxation and contraction of PFM with coordination of breath to reduce urinary leakage to </=once/week. Baseline: intermittent with urge UI when holding urine too long Goal status: INITIAL    LONG TERM GOALS: Target date: for all LTGs: 01/24/24   Pt will improve hip and core strength to decr. Back pain to </=2/10 at worst when bending forward for >10 minutes. Baseline: 8/10 at worst Goal status: INITIAL  2.  Pt will demonstrated improved relaxation and contraction of PFM with coordination of breath to reduce fecal leakage to </=once/week. Baseline: occurs with soft stool Goal status: INITIAL   PLAN: check STGs, review and progress strengthening HEP. Scar. Mob (ab)   PT FREQUENCY: 1x/week  PT DURATION: 8 weeks  PLANNED INTERVENTIONS: [id number]- PT Re-evaluation, 97110-Therapeutic exercises, 97530- Therapeutic activity, [id number]- Neuromuscular re-education, 97535- Self Care, [id number]- Manual therapy, [id number]-  Gait training, 79439 (1-2 muscles), 20561 (3+ muscles)- Dry Needling, Patient/Family education, Balance training,  Taping, Joint mobilization, Spinal mobilization, Scar mobilization, Cryotherapy, Moist heat, and Biofeedback    Sairah Knobloch L, PT 12/11/2023, 3:37 PM  Delon Pinal, PT,DPT 12/11/23 3:37 PM Phone: [phone] Fax: [phone]

## 2023-12-13 ENCOUNTER — Ambulatory Visit

## 2023-12-20 ENCOUNTER — Other Ambulatory Visit: Payer: Self-pay | Admitting: Internal Medicine

## 2023-12-20 ENCOUNTER — Telehealth: Payer: Self-pay | Admitting: Internal Medicine

## 2023-12-20 DIAGNOSIS — E119 Type 2 diabetes mellitus without complications: Secondary | ICD-10-CM

## 2023-12-20 DIAGNOSIS — E039 Hypothyroidism, unspecified: Secondary | ICD-10-CM

## 2023-12-20 DIAGNOSIS — I152 Hypertension secondary to endocrine disorders: Secondary | ICD-10-CM

## 2023-12-20 NOTE — Telephone Encounter (Signed)
 Patient left VM requesting lab orders be placed, specifically for a lipid panel, to be done before her appointment on 12/22. Please place orders.

## 2023-12-26 LAB — CMP14+EGFR
ALT: 15 IU/L (ref 0–32)
AST: 21 IU/L (ref 0–40)
Albumin: 4.3 g/dL (ref 3.7–4.7)
Alkaline Phosphatase: 47 IU/L — ABNORMAL LOW (ref 48–129)
BUN/Creatinine Ratio: 30 — ABNORMAL HIGH (ref 12–28)
BUN: 21 mg/dL (ref 8–27)
Bilirubin Total: 0.3 mg/dL (ref 0.0–1.2)
CO2: 22 mmol/L (ref 20–29)
Calcium: 9.5 mg/dL (ref 8.7–10.3)
Chloride: 101 mmol/L (ref 96–106)
Creatinine, Ser: 0.71 mg/dL (ref 0.57–1.00)
Globulin, Total: 2.1 g/dL (ref 1.5–4.5)
Glucose: 107 mg/dL — ABNORMAL HIGH (ref 70–99)
Potassium: 4.3 mmol/L (ref 3.5–5.2)
Sodium: 138 mmol/L (ref 134–144)
Total Protein: 6.4 g/dL (ref 6.0–8.5)
eGFR: 84 mL/min/1.73 (ref >59)

## 2023-12-26 LAB — CBC WITH DIFFERENTIAL/PLATELET
Basophils Absolute: 0.1 x10E3/uL (ref 0.0–0.2)
Basos: 1 %
EOS (ABSOLUTE): 0.1 x10E3/uL (ref 0.0–0.4)
Eos: 1 %
Hematocrit: 40.9 % (ref 34.0–46.6)
Hemoglobin: 13.3 g/dL (ref 11.1–15.9)
Immature Grans (Abs): 0 x10E3/uL (ref 0.0–0.1)
Immature Granulocytes: 0 %
Lymphocytes Absolute: 2.2 x10E3/uL (ref 0.7–3.1)
Lymphs: 24 %
MCH: 30 pg (ref 26.6–33.0)
MCHC: 32.5 g/dL (ref 31.5–35.7)
MCV: 92 fL (ref 79–97)
Monocytes Absolute: 0.7 x10E3/uL (ref 0.1–0.9)
Monocytes: 8 %
Neutrophils Absolute: 5.9 x10E3/uL (ref 1.4–7.0)
Neutrophils: 66 %
Platelets: 205 x10E3/uL (ref 150–450)
RBC: 4.43 x10E6/uL (ref 3.77–5.28)
RDW: 13.8 % (ref 11.7–15.4)
WBC: 9 x10E3/uL (ref 3.4–10.8)

## 2023-12-26 LAB — LIPID PANEL W/O CHOL/HDL RATIO
Cholesterol, Total: 114 mg/dL (ref 100–199)
HDL: 57 mg/dL (ref >39)
LDL Chol Calc (NIH): 41 mg/dL (ref 0–99)
Triglycerides: 83 mg/dL (ref 0–149)
VLDL Cholesterol Cal: 16 mg/dL (ref 5–40)

## 2023-12-26 LAB — HEMOGLOBIN A1C
Est. average glucose Bld gHb Est-mCnc: 123 mg/dL
Hgb A1c MFr Bld: 5.9 % — ABNORMAL HIGH (ref 4.8–5.6)

## 2023-12-26 LAB — TSH+T4F+T3FREE
Free T4: 1.26 ng/dL (ref 0.82–1.77)
T3, Free: 2.2 pg/mL (ref 2.0–4.4)
TSH: 3.72 u[IU]/mL (ref 0.450–4.500)

## 2023-12-27 ENCOUNTER — Ambulatory Visit: Payer: Self-pay | Admitting: Internal Medicine

## 2023-12-27 ENCOUNTER — Ambulatory Visit

## 2023-12-27 ENCOUNTER — Other Ambulatory Visit: Payer: Self-pay

## 2023-12-27 DIAGNOSIS — M6281 Muscle weakness (generalized): Secondary | ICD-10-CM | POA: Diagnosis not present

## 2023-12-27 DIAGNOSIS — R2689 Other abnormalities of gait and mobility: Secondary | ICD-10-CM

## 2023-12-27 DIAGNOSIS — N816 Rectocele: Secondary | ICD-10-CM

## 2023-12-27 DIAGNOSIS — R159 Full incontinence of feces: Secondary | ICD-10-CM

## 2023-12-27 DIAGNOSIS — N3941 Urge incontinence: Secondary | ICD-10-CM

## 2023-12-27 DIAGNOSIS — R293 Abnormal posture: Secondary | ICD-10-CM

## 2023-12-27 NOTE — Therapy (Signed)
 OUTPATIENT PHYSICAL THERAPY FEMALE PELVIC TREATMENT    Patient Name: Nicole Young MRN: [id number] DOB:[birth year], 83 y.o., female Today's Date: 12/27/2023  END OF SESSION:  PT End of Session - 12/27/23 1452     Visit Number 4    Number of Visits 9    Date for Recertification  01/28/24    Authorization Type Medicare and BCBS    Progress Note Due on Visit 10    PT Start Time 1449    PT Stop Time 1515   per pt 2/2 pain   PT Time Calculation (min) 26 min    Activity Tolerance Patient limited by pain    Behavior During Therapy [facility] for tasks assessed/performed          Past Medical History:  Diagnosis Date   Acute bacterial sinusitis 11/18/2021   Allergic rhinitis    AMD (acid maltase deficiency) (HCC)    Anterior knee pain 02/10/2022   Arthralgia of multiple joints 11/12/2014   Biliary dyskinesia 2025   Bursitis of hip 02/10/2022   Carpal tunnel syndrome 06/30/2014   Cervical arthritis 07/06/2016   Cholelithiasis 2025   Complication of anesthesia    slow to wake up in past   Depression    controlled   Diabetes mellitus without complication (HCC)    controlled   GERD (gastroesophageal reflux disease)    Heart palpitations 07/06/2020   Hyperlipidemia    Hypertension    Hypothyroidism    Impingement syndrome of left shoulder 02/25/2021   Iron deficiency 01/15/2023   Lipoma of foot 02/10/2022   Neuropathy    Osteoarthritis    knee, ankles, shoulders, back, neck, hands/fingers, wrists;    Osteoarthritis of knee 10/10/2017   Premature ventricular contractions 08/09/2020   Pure hypercholesterolemia 07/06/2020   Rectocele    Sleep apnea    Thyroid  disease    Vaginal atrophy 08/16/2018   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[facility])    Past Surgical History:  Procedure Laterality Date   AUGMENTATION MAMMAPLASTY  1975   AUGMENTATION MAMMAPLASTY  2017   2nd augmentation and lift   BIOPSY  07/11/2022   Procedure: BIOPSY;  Surgeon: Jinny Carmine, MD;  Location: [facility]  ENDOSCOPY;  Service: Endoscopy;;   BREAST SURGERY     impants replaced with lift   CARPAL TUNNEL RELEASE Right 08/26/2014   Procedure: CARPAL TUNNEL RELEASE;  Surgeon: Kayla Pinal, MD;  Location: [facility] ORS;  Service: Orthopedics;  Laterality: Right;   COLONOSCOPY  2005   Dr Dessa   COLONOSCOPY N/A 06/24/2014   Procedure: COLONOSCOPY;  Surgeon: Reyes LELON Dessa, MD;  Location: [facility] ENDOSCOPY;  Service: Endoscopy;  Laterality: N/A;   COLONOSCOPY N/A 04/11/2023   Procedure: COLONOSCOPY;  Surgeon: Therisa Bi, MD;  Location: [facility] ENDOSCOPY;  Service: Gastroenterology;  Laterality: N/A;  please schedule in am, she is diabetic   ESOPHAGOGASTRODUODENOSCOPY (EGD) WITH PROPOFOL  N/A 07/11/2022   Procedure: ESOPHAGOGASTRODUODENOSCOPY (EGD) WITH PROPOFOL ;  Surgeon: Jinny Carmine, MD;  Location: [facility] ENDOSCOPY;  Service: Endoscopy;  Laterality: N/A;   PARATHYROIDECTOMY  1992   PLACEMENT OF BREAST IMPLANTS  1975   POLYPECTOMY  04/11/2023   Procedure: POLYPECTOMY;  Surgeon: Therisa Bi, MD;  Location: [facility] ENDOSCOPY;  Service: Gastroenterology;;   MARINE, ADENOIDECTOMY, BILATERAL MYRINGOTOMY AND TUBES     UMBILICAL HERNIA REPAIR  04/24/2023   Procedure: REPAIR, HERNIA, UMBILICAL, ADULT;  Surgeon: Rodolph Romano, MD;  Location: [facility] ORS;  Service: General;;   Patient Active Problem List   Diagnosis Date Noted   Cyst  of perineum of female 11/27/2023   Incontinence of feces 11/27/2023   Overweight with body mass index (BMI) of 26 to 26.9 in adult 11/27/2023   Cutaneous abscess of perineum 11/27/2023   Vitamin D  deficiency 08/30/2023   Chronic low back pain without sciatica 08/30/2023   Pelvic floor weakness in female 08/30/2023   Osteopenia after menopause 08/30/2023   OSA (obstructive sleep apnea) 07/16/2023   CPAP use counseling 07/16/2023   Acute cholecystitis 04/24/2023   Positive colorectal cancer screening using Cologuard test 04/11/2023   Adenomatous polyp of colon 04/11/2023    Combined hyperlipidemia associated with type 2 diabetes mellitus (HCC) 02/12/2023   OSA on CPAP 01/15/2023   Iron deficiency 01/15/2023   Snoring 10/02/2022   Gastroesophageal reflux disease 07/11/2022   Acid indigestion 07/11/2022   Anterior knee pain 02/10/2022   Bursitis of hip 02/10/2022   Lipoma of foot 02/10/2022   Acute bacterial sinusitis 11/18/2021   Impingement syndrome of left shoulder region 02/25/2021   Premature ventricular contractions 08/09/2020   Heart palpitations 07/06/2020   Pure hypercholesterolemia 07/06/2020   Vaginal atrophy 08/16/2018   Rectocele 08/16/2018   Osteoarthritis of knee 10/10/2017   Scoliosis of lumbar spine 07/06/2016   Degeneration of lumbar intervertebral disc 07/06/2016   Cervical arthritis 07/06/2016   Arthralgia of multiple joints 11/12/2014   Allergic rhinitis 11/12/2014   Diabetes mellitus (HCC) 11/12/2014   Hypertension associated with diabetes (HCC) 11/12/2014   Personal history of other drug therapy 11/12/2014   HLD (hyperlipidemia) 11/12/2014   Adult hypothyroidism 11/12/2014   Mild major depression 11/12/2014   Overweight 11/12/2014   Restless leg 11/12/2014   Arthritis of hand 06/30/2014   Carpal tunnel syndrome 06/30/2014   Encounter for screening colonoscopy 04/17/2014   Type 2 diabetes mellitus (HCC) 12/23/2013    PCP: Fredy Bathe, MD  REFERRING PROVIDER: Fredy Bathe  REFERRING DIAG: PF weakness, chronic LBP, DDD lx spine  THERAPY DIAG:  Muscle weakness (generalized)  Other abnormalities of gait and mobility  Abnormal posture  Rectocele  Urgency incontinence  Incontinence of feces with fecal urgency  Rationale for Evaluation and Treatment: Rehabilitation  ONSET DATE: 08/30/23 referral date  SUBJECTIVE:                                                                                                                                                                                           SUBJECTIVE  STATEMENT: Pt reported she has been standing all day baking and incr. Pain (back, hip, knee, neck and shoulder pain). She's been sitting on the heating pad and it helps for a bit. Pt is getting a steroid injection  tomorrow for L shoulder. Pt reported she's been performing HEP approx. three Days per week. Pt requesting to have a limited session 2/2 pain.  EVAL: She does chair yoga here twice a week. URINARY FUNCTION: she was getting up 4-5/night to void but then had PHPT two years ago and it helped. Now she gets up once/night. Pt voids every few hours. Pt denied pain with urination. Pt has urge incontinence, maybe 1-2/week.  BOWEL FUNCTION: every three days, on ozempic (causes diarrhea) but typically on the verge of constipation. Pt gets maybe 15-20g of fiber. Pt has hemorrhoids and occasionally painful. Pt has fecal leakage when stool is soft, unpredictable. Pt wears pads when this occurs, 2-3/day. Otherwise does not wear pads. Pt does have to use fingers to splint rectocele in via vaginal canal to have BM.  CORE STABILITY: hx of cholecystectomy 04/2023. She feels like she's strong but does have hx of LBP with hx of scoliosis. Back pain occurs with forward bending in 10 minutes pain incr. To 8/10, stops and then pain improves.  SEXUAL FUNCTION: no hx of issues with intercourse, OBGYN exam or tampon insertion. No issues with climaxing.   Fluid intake: 6-8 glasses of liquid (water, tea, coffee), no alcohol    FUNCTIONAL LIMITATIONS: pain during yoga in certain positions, gardening is difficulty   PERTINENT HISTORY:  Medications for current condition: estradiol  2-3/week Surgeries: none Other: HTN, diabetes, OSA with CPAP, rectocele, cystocele, GERD, adenomatous polyp of colon, hypothyroidism, cholecystectomy 05/04/23, HLD, OA B (R is worse but not bad now), scoliosis of lx spine, DDD of lx spine, osteopenia, bursitis of B hip but controlled, impingement syndrome of L shoulder, depression (PPD), B breast  augmentation, umbilical hernia repair 05/04/23 Sexual abuse: No  PAIN:  Are you having pain? Yes 12/27/23 NPRS scale: it varies 5-8/10 LBP, knee, hips, shoulders, neck Worse: standing for prolonged periods of time, leaning forward Better: resting and heating pad, getting steroid for L shoulder tomorrow  PRECAUTIONS: None  RED FLAGS: None   WEIGHT BEARING RESTRICTIONS: No  FALLS:  Has patient fallen in last 6 months? No  OCCUPATION: retired  ACTIVITY LEVEL : gardening, read, paint, sew, move furniture, love building things   PLOF: Independent  PATIENT GOALS: to improve fecal leakage, improved bladder issues, improve back pain   BOWEL MOVEMENT: Pain with bowel movement: Yes Type of bowel movement:Frequency every three days and intermittent diarrhea 2/2 ozempic with fecal leakage Fully empty rectum: Yes:   Leakage: Yes: 2/2 ozempic and has rectocele                                                  Caused by:  Bowel urgency: yes Pads: Yes: 2-3 pads Fiber supplement/laxative No  URINATION: Pain with urination: No Fully empty bladder: Yes:                                           Post-void dribble: No Stream: Strong Urgency: Yes  Frequency:during the day every few hours  Nocturia: Yes: once/night   Leakage: Urge to void and Walking to the bathroom Pads/briefs: No  INTERCOURSE:  Not sexually active.  PREGNANCY: Number of pregnancies: 4 Vaginal deliveries 3 Tearing Yes: big time but does not know degree and high pressure forceps delivery and tearing with second Episiotomy Yes, lateral episiotomy C-section deliveries 0 Currently pregnant No  PROLAPSE: Pressure and Bulge rectocele and cystocele   OBJECTIVE:  Note: Objective measures were completed at Evaluation unless otherwise noted.     COGNITION: Overall cognitive status: Within functional limits for tasks assessed     SENSATION: Light touch:  Appears intact  FUNCTIONAL TESTS:   Single leg stance:  Rt: incr. Postural sway  Lt: incr. Postural sway Sit-up test: Squat: Bed mobility:  GAIT: Assistive device utilized: None Comments: decr. Trunk rot and stride length  POSTURE: R convex tx spine curve, L lx convex curve R shoulder and R hip elevated in stance, post. Pelvic tilt, incr. Kyposis upper tx spine, FHP   LUMBARAROM/PROM: limited R rot and ext. With pain afterwards.  A/PROM A/PROM  Eval (% available)  Flexion   Extension   Right lateral flexion   Left lateral flexion   Right rotation   Left rotation    (Blank rows = not tested)  LOWER EXTREMITY ROM: B hip IR limited by approx. 50%  Active ROM Right eval Left eval  Hip flexion    Hip extension    Hip abduction    Hip adduction    Hip internal rotation    Hip external rotation    Knee flexion    Knee extension    Ankle dorsiflexion    Ankle plantarflexion    Ankle inversion    Ankle eversion     (Blank rows = not tested)  LOWER EXTREMITY MMT:  MMT Right eval Left eval  Hip flexion 4+ 4+  Hip extension    Hip abduction 3 3  Hip adduction 3+ 3+  Hip internal rotation limited ROM 2 2  Hip external rotation 4 4  Knee flexion 4+ 4+  Knee extension 5 5  Ankle dorsiflexion 5 5  Ankle plantarflexion    Ankle inversion    Ankle eversion     (Blank rows = not tested) PALPATION:  General: no TTP over cx-lx spine in standing.  Pelvic Alignment: post. Pelvic tilt  11/25: TTP over L distal hamstring and L gastroc, no DR noted, hypomobility of tx spine with hx of scoliosis and ostepenia, limited B hip IR, hip weakness. Incr. Infrasternal rib angle and decr. Lat/post rib translation during diaphragmatic breathing.     PELVIC MMT:   MMT eval  Vaginal   Internal Anal Sphincter   External Anal Sphincter   Puborectalis   (Blank rows = not tested)        TONE: WNL with incr. Tension in L distal hamstring and L proximal  gastroc.  PROLAPSE: hx of rectocele and cystocele per pt  TODAY'S TREATMENT:  DATE: 12/27/23   SELF CARE: REVIEW ONLY 2/2 PT'S PAIN TODAY.  Access Code: [email or web address] URL: [email or web address] Date: 12/27/2023 Prepared by: Delon Pinal  - Dead Bug  - 1 x daily - 3 x weekly - 4 sets - 5 reps, performing chest press and overhead reach with 3 pound weight separt - Clamshell with Resistance  - 1 x daily - 3 x weekly - 3 sets - 10 reps - Mini Squat with Counter Support  - 1 x daily - 3 x weekly - 3 sets - 10 reps - Supine Double Knee to Chest  - 1 x daily - 7 x weekly - 1 sets - 3 reps - 30  Cues and demo VIA DEMO for proper technique. Pt lying in supine with heat pack on back during session and reported decr. Pain and tension after session.  SELF CARE: PATIENT EDUCATION:  Education details: PT educated on goal progress and continuing with HEP and notifying MD if pain persists. Person educated: Patient Education method: Explanation, Demonstration, and Handouts Education comprehension: verbalized understanding, returned demonstration, and needs further education  HOME EXERCISE PROGRAM: [email or web address]  ASSESSMENT:  CLINICAL IMPRESSION: Skilled session focused on assessing STGs, met all STGs except for partially met HEP goal 2/2 performing only 3x/week. Pt limited today by pain, so PT demonstrated HEP to ensure proper technique, reviewed goals while pt in hooklying on heating pad to reduce back pain-less at end of session. The following impairments were noted upon exam: limited ROM, back and shoulder pain, postural dysfunction, decr. Strength, gait deviations, nocturia, urge UI. Pt would continue to benefit from skilled PT to improve safety and decr. Pain during all ADLs.   OBJECTIVE IMPAIRMENTS: Abnormal gait, decreased balance, decreased endurance, decreased  mobility, decreased ROM, decreased strength, hypomobility, increased fascial restrictions, postural dysfunction, and pain.   ACTIVITY LIMITATIONS: carrying, lifting, bending, standing, sleeping, continence, toileting, and locomotion level  PARTICIPATION LIMITATIONS: meal prep, cleaning, laundry, and yard work  PERSONAL FACTORS: Age, Past/current experiences, Time since onset of injury/illness/exacerbation, and 3+ comorbidities: see above are also affecting patient's functional outcome.   REHAB POTENTIAL: Good  CLINICAL DECISION MAKING: Stable/uncomplicated  EVALUATION COMPLEXITY: Low   GOALS: Goals reviewed with patient? Yes  SHORT TERM GOALS: Target date: for all STGs: 12/27/23  Pt will be IND in HEP to improve pain, strength, coordination. Baseline: chair yoga twice a week; 12/18: performing HEP three/week and performing chair yoga twice a week. Goal status: PARTIALLY MET  2.  Finish exam and write goals as indicated. Baseline: limited by time constraints Goal status: MET  3.  Pt will demo proper toileting posture to fully empty bladder and reduce straining during bowel movement. Baseline: unable to demo, 12/18: able to perform, proper toileting posture has been very helpful for constipation Goal status: MET  4.  Pt will demonstrated improved relaxation and contraction of PFM with coordination of breath to reduce urinary leakage to </=once/week. Baseline: intermittent with urge UI when holding urine too long; 12/18: pt has not experienced leakage in the last four weeks. Goal status: MET    LONG TERM GOALS: Target date: for all LTGs: 01/24/24   Pt will improve hip and core strength to decr. Back pain to </=2/10 at worst when bending forward for >10 minutes. Baseline: 8/10 at worst Goal status: INITIAL  2.  Pt will demonstrated improved relaxation and contraction of PFM with coordination of breath to reduce fecal leakage to </=once/week. Baseline: occurs with soft  stool Goal status: INITIAL   PLAN:  add PFM contractions, review and progress strengthening HEP. Scar. Mob (ab)   PT FREQUENCY: 1x/week  PT DURATION: 8 weeks  PLANNED INTERVENTIONS: 97164- PT Re-evaluation, 97110-Therapeutic exercises, 97530- Therapeutic activity, 97112- Neuromuscular re-education, 97535- Self Care, [id number]- Manual therapy, [phone]- Gait training, [phone] (1-2 muscles), [phone] (3+ muscles)- Dry Needling, Patient/Family education, Balance training, Taping, Joint mobilization, Spinal mobilization, Scar mobilization, Cryotherapy, Moist heat, and Biofeedback    Eula Jaster L, PT 12/27/2023, 3:28 PM  Delon Pinal, PT,DPT 12/27/2023 3:28 PM Phone: [phone] Fax: [phone]

## 2023-12-31 ENCOUNTER — Ambulatory Visit: Admitting: Internal Medicine

## 2024-01-08 ENCOUNTER — Ambulatory Visit

## 2024-01-17 ENCOUNTER — Ambulatory Visit: Attending: Internal Medicine

## 2024-01-24 ENCOUNTER — Other Ambulatory Visit: Payer: Self-pay

## 2024-01-24 ENCOUNTER — Ambulatory Visit: Attending: Internal Medicine

## 2024-01-24 DIAGNOSIS — R2689 Other abnormalities of gait and mobility: Secondary | ICD-10-CM | POA: Insufficient documentation

## 2024-01-24 DIAGNOSIS — R159 Full incontinence of feces: Secondary | ICD-10-CM | POA: Insufficient documentation

## 2024-01-24 DIAGNOSIS — M6281 Muscle weakness (generalized): Secondary | ICD-10-CM | POA: Diagnosis present

## 2024-01-24 DIAGNOSIS — N3941 Urge incontinence: Secondary | ICD-10-CM | POA: Diagnosis present

## 2024-01-24 DIAGNOSIS — R152 Fecal urgency: Secondary | ICD-10-CM | POA: Diagnosis present

## 2024-01-24 DIAGNOSIS — N816 Rectocele: Secondary | ICD-10-CM | POA: Insufficient documentation

## 2024-01-24 DIAGNOSIS — R293 Abnormal posture: Secondary | ICD-10-CM | POA: Diagnosis present

## 2024-01-24 NOTE — Therapy (Addendum)
 " OUTPATIENT PHYSICAL THERAPY FEMALE PELVIC TREATMENT/re-cert   Patient Name: ROBYNNE ROAT MRN: [id number] DOB:02/07/1940, 84 y.o., female Today's Date: 01/24/2024  END OF SESSION:  PT End of Session - 01/24/24 1452     Visit Number 5    Number of Visits 9    Date for Recertification  01/28/24    Authorization Type Medicare and BCBS    Progress Note Due on Visit 10    PT Start Time 1448    PT Stop Time 1530    PT Time Calculation (min) 42 min    Activity Tolerance Patient limited by pain    Behavior During Therapy [facility] for tasks assessed/performed          Past Medical History:  Diagnosis Date   Acute bacterial sinusitis 11/18/2021   Allergic rhinitis    AMD (acid maltase deficiency) (HCC)    Anterior knee pain 02/10/2022   Arthralgia of multiple joints 11/12/2014   Biliary dyskinesia 2025   Bursitis of hip 02/10/2022   Carpal tunnel syndrome 06/30/2014   Cervical arthritis 07/06/2016   Cholelithiasis 2025   Complication of anesthesia    slow to wake up in past   Depression    controlled   Diabetes mellitus without complication (HCC)    controlled   GERD (gastroesophageal reflux disease)    Heart palpitations 07/06/2020   Hyperlipidemia    Hypertension    Hypothyroidism    Impingement syndrome of left shoulder 02/25/2021   Iron deficiency 01/15/2023   Lipoma of foot 02/10/2022   Neuropathy    Osteoarthritis    knee, ankles, shoulders, back, neck, hands/fingers, wrists;    Osteoarthritis of knee 10/10/2017   Premature ventricular contractions 08/09/2020   Pure hypercholesterolemia 07/06/2020   Rectocele    Sleep apnea    Thyroid  disease    Vaginal atrophy 08/16/2018   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[facility])    Past Surgical History:  Procedure Laterality Date   AUGMENTATION MAMMAPLASTY  1975   AUGMENTATION MAMMAPLASTY  2017   2nd augmentation and lift   BIOPSY  07/11/2022   Procedure: BIOPSY;  Surgeon: Jinny Carmine, MD;  Location: [facility] ENDOSCOPY;   Service: Endoscopy;;   BREAST SURGERY     impants replaced with lift   CARPAL TUNNEL RELEASE Right 08/26/2014   Procedure: CARPAL TUNNEL RELEASE;  Surgeon: Kayla Pinal, MD;  Location: [facility] ORS;  Service: Orthopedics;  Laterality: Right;   COLONOSCOPY  2005   Dr Dessa   COLONOSCOPY N/A 06/24/2014   Procedure: COLONOSCOPY;  Surgeon: Reyes LELON Dessa, MD;  Location: [facility] ENDOSCOPY;  Service: Endoscopy;  Laterality: N/A;   COLONOSCOPY N/A 04/11/2023   Procedure: COLONOSCOPY;  Surgeon: Therisa Bi, MD;  Location: [facility]OSCOPY;  Service: Gastroenterology;  Laterality: N/A;  please schedule in am, she is diabetic   ESOPHAGOGASTRODUODENOSCOPY (EGD) WITH PROPOFOL  N/A 07/11/2022   Procedure: ESOPHAGOGASTRODUODENOSCOPY (EGD) WITH PROPOFOL ;  Surgeon: Jinny Carmine, MD;  Location: [facility] ENDOSCOPY;  Service: Endoscopy;  Laterality: N/A;   PARATHYROIDECTOMY  1992   PLACEMENT OF BREAST IMPLANTS  1975   POLYPECTOMY  04/11/2023   Procedure: POLYPECTOMY;  Surgeon: Therisa Bi, MD;  Location: [facility] ENDOSCOPY;  Service: Gastroenterology;;   MARINE, ADENOIDECTOMY, BILATERAL MYRINGOTOMY AND TUBES     UMBILICAL HERNIA REPAIR  04/24/2023   Procedure: REPAIR, HERNIA, UMBILICAL, ADULT;  Surgeon: Rodolph Romano, MD;  Location: [facility] ORS;  Service: General;;   Patient Active Problem List   Diagnosis Date Noted   Cyst of perineum of female 11/27/2023  Incontinence of feces 11/27/2023   Overweight with body mass index (BMI) of 26 to 26.9 in adult 11/27/2023   Cutaneous abscess of perineum 11/27/2023   Vitamin D  deficiency 08/30/2023   Chronic low back pain without sciatica 08/30/2023   Pelvic floor weakness in female 08/30/2023   Osteopenia after menopause 08/30/2023   OSA (obstructive sleep apnea) 07/16/2023   CPAP use counseling 07/16/2023   Acute cholecystitis 04/24/2023   Positive colorectal cancer screening using Cologuard test 04/11/2023   Adenomatous polyp of colon 04/11/2023   Combined  hyperlipidemia associated with type 2 diabetes mellitus (HCC) 02/12/2023   OSA on CPAP 01/15/2023   Iron deficiency 01/15/2023   Snoring 10/02/2022   Gastroesophageal reflux disease 07/11/2022   Acid indigestion 07/11/2022   Anterior knee pain 02/10/2022   Bursitis of hip 02/10/2022   Lipoma of foot 02/10/2022   Acute bacterial sinusitis 11/18/2021   Impingement syndrome of left shoulder region 02/25/2021   Premature ventricular contractions 08/09/2020   Heart palpitations 07/06/2020   Pure hypercholesterolemia 07/06/2020   Vaginal atrophy 08/16/2018   Rectocele 08/16/2018   Osteoarthritis of knee 10/10/2017   Scoliosis of lumbar spine 07/06/2016   Degeneration of lumbar intervertebral disc 07/06/2016   Cervical arthritis 07/06/2016   Arthralgia of multiple joints 11/12/2014   Allergic rhinitis 11/12/2014   Diabetes mellitus (HCC) 11/12/2014   Hypertension associated with diabetes (HCC) 11/12/2014   Personal history of other drug therapy 11/12/2014   HLD (hyperlipidemia) 11/12/2014   Adult hypothyroidism 11/12/2014   Mild major depression 11/12/2014   Overweight 11/12/2014   Restless leg 11/12/2014   Arthritis of hand 06/30/2014   Carpal tunnel syndrome 06/30/2014   Encounter for screening colonoscopy 04/17/2014   Type 2 diabetes mellitus (HCC) 12/23/2013    PCP: Fredy Bathe, MD  REFERRING PROVIDER: Fredy Bathe  REFERRING DIAG: PF weakness, chronic LBP, DDD lx spine  THERAPY DIAG:  Muscle weakness (generalized)  Other abnormalities of gait and mobility  Abnormal posture  Rectocele  Urgency incontinence  Incontinence of feces with fecal urgency  Rationale for Evaluation and Treatment: Rehabilitation  ONSET DATE: 08/30/23 referral date  SUBJECTIVE:                                                                                                                                                                                           SUBJECTIVE STATEMENT: Pt  reported she was sick after Christmas. Back pain improved and she was doing exercises and the clam caused R hip pain. She feels like she's able to perform PFM contractions correctly. No bladder issues. Still has small fecal leakage with sneezing or coughing if  she has diarrhea. She has not had constipation either.   EVAL: She does chair yoga here twice a week. URINARY FUNCTION: she was getting up 4-5/night to void but then had PHPT two years ago and it helped. Now she gets up once/night. Pt voids every few hours. Pt denied pain with urination. Pt has urge incontinence, maybe 1-2/week.  BOWEL FUNCTION: every three days, on ozempic (causes diarrhea) but typically on the verge of constipation. Pt gets maybe 15-20g of fiber. Pt has hemorrhoids and occasionally painful. Pt has fecal leakage when stool is soft, unpredictable. Pt wears pads when this occurs, 2-3/day. Otherwise does not wear pads. Pt does have to use fingers to splint rectocele in via vaginal canal to have BM.  CORE STABILITY: hx of cholecystectomy 04/2023. She feels like she's strong but does have hx of LBP with hx of scoliosis. Back pain occurs with forward bending in 10 minutes pain incr. To 8/10, stops and then pain improves.  SEXUAL FUNCTION: no hx of issues with intercourse, OBGYN exam or tampon insertion. No issues with climaxing.   Fluid intake: 6-8 glasses of liquid (water, tea, coffee), no alcohol    FUNCTIONAL LIMITATIONS: pain during yoga in certain positions, gardening is difficulty   PERTINENT HISTORY:  Medications for current condition: estradiol  2-3/week Surgeries: none Other: HTN, diabetes, OSA with CPAP, rectocele, cystocele, GERD, adenomatous polyp of colon, hypothyroidism, cholecystectomy 05/04/23, HLD, OA B (R is worse but not bad now), scoliosis of lx spine, DDD of lx spine, osteopenia, bursitis of B hip but controlled, impingement syndrome of L shoulder, depression (PPD), B breast augmentation, umbilical hernia repair  05/04/23 Sexual abuse: No  PAIN:  Are you having pain? Yes 01/24/24 NPRS scale: 0/10 When present: LBP, knee, hips, shoulders, neck Worse: standing for prolonged periods of time, leaning forward Better: resting and heating pad, getting steroid for L shoulder tomorrow  PRECAUTIONS: None  RED FLAGS: None   WEIGHT BEARING RESTRICTIONS: No  FALLS:  Has patient fallen in last 6 months? No  OCCUPATION: retired  ACTIVITY LEVEL : gardening, read, paint, sew, move furniture, love building things   PLOF: Independent  PATIENT GOALS: to improve fecal leakage, improved bladder issues, improve back pain   BOWEL MOVEMENT: Pain with bowel movement: Yes Type of bowel movement:Frequency every three days and intermittent diarrhea 2/2 ozempic with fecal leakage Fully empty rectum: Yes:   Leakage: Yes: 2/2 ozempic and has rectocele                                                  Caused by:  Bowel urgency: yes Pads: Yes: 2-3 pads Fiber supplement/laxative No  URINATION: Pain with urination: No Fully empty bladder: Yes:                                           Post-void dribble: No Stream: Strong Urgency: Yes  Frequency:during the day every few hours                                                        Nocturia: Yes:  once/night   Leakage: Urge to void and Walking to the bathroom Pads/briefs: No  INTERCOURSE:  Not sexually active.  PREGNANCY: Number of pregnancies: 4 Vaginal deliveries 3 Tearing Yes: big time but does not know degree and high pressure forceps delivery and tearing with second Episiotomy Yes, lateral episiotomy C-section deliveries 0 Currently pregnant No  PROLAPSE: Pressure and Bulge rectocele and cystocele   OBJECTIVE:  Note: Objective measures were completed at Evaluation unless otherwise noted.     COGNITION: Overall cognitive status: Within functional limits for tasks assessed     SENSATION: Light touch: Appears intact  FUNCTIONAL TESTS:    Single leg stance:  Rt: incr. Postural sway  Lt: incr. Postural sway Sit-up test: Squat: Bed mobility:  GAIT: Assistive device utilized: None Comments: decr. Trunk rot and stride length  POSTURE: R convex tx spine curve, L lx convex curve R shoulder and R hip elevated in stance, post. Pelvic tilt, incr. Kyposis upper tx spine, FHP   LUMBARAROM/PROM: limited R rot and ext. With pain afterwards.  A/PROM A/PROM  Eval (% available)  Flexion   Extension   Right lateral flexion   Left lateral flexion   Right rotation   Left rotation    (Blank rows = not tested)  LOWER EXTREMITY ROM: B hip IR limited by approx. 50%  Active ROM Right eval Left eval  Hip flexion    Hip extension    Hip abduction    Hip adduction    Hip internal rotation    Hip external rotation    Knee flexion    Knee extension    Ankle dorsiflexion    Ankle plantarflexion    Ankle inversion    Ankle eversion     (Blank rows = not tested)  LOWER EXTREMITY MMT:  MMT Right eval Left eval  Hip flexion 4+ 4+  Hip extension    Hip abduction 3 3  Hip adduction 3+ 3+  Hip internal rotation limited ROM 2 2  Hip external rotation 4 4  Knee flexion 4+ 4+  Knee extension 5 5  Ankle dorsiflexion 5 5  Ankle plantarflexion    Ankle inversion    Ankle eversion     (Blank rows = not tested) PALPATION:  General: no TTP over cx-lx spine in standing.  Pelvic Alignment: post. Pelvic tilt  11/25: TTP over L distal hamstring and L gastroc, no DR noted, hypomobility of tx spine with hx of scoliosis and ostepenia, limited B hip IR, hip weakness. Incr. Infrasternal rib angle and decr. Lat/post rib translation during diaphragmatic breathing.     PELVIC MMT:   MMT eval  Vaginal   Internal Anal Sphincter   External Anal Sphincter   Puborectalis   (Blank rows = not tested)        TONE: WNL with incr. Tension in L distal hamstring and L proximal gastroc.  PROLAPSE: hx of rectocele and cystocele per  pt  TODAY'S TREATMENT:  DATE: 01/24/24  NMR:  Access Code: [email or web address] URL: [email or web address] Date: 01/24/2024 Prepared by: Delon Pinal  Exercises - Seated Pelvic Floor Contraction  - 1 x daily - 7 x weekly - 1 sets - 10 reps as lying down incr. Back pain - Seated Marching with Opposite Shoulder Flexion  - 1-2 x daily - 7 x weekly - 1 sets - 10 reps (dead bugs incr. Back pain during session) - Side Stepping with Resistance at Ankles and Counter Support  - 1 x daily - 3 x weekly - 4 sets - 5 reps no band for now - Standing Hip Hinge with Resistance  - 1 x daily - 3 x weekly - 2 sets - 10 reps no band for now Cues and demo for proper technique. S for safety. Slight incr. In back pain.   SELF CARE: PATIENT EDUCATION:  Education details: PT educated on goal progress and modifying HEP to cease supine exercises that incr. Pain. PT discussed the importance of only performing PFM contractions max of twice a day to decr. Tension and overuse, as this can incr. Leakage and pain. PT educated pt on performing HEP in pain free range and to take rest breaks as needed versus pushing through. Person educated: Patient Education method: Programmer, Multimedia, Demonstration, and Handouts Education comprehension: verbalized understanding, returned demonstration, and needs further education  HOME EXERCISE PROGRAM: [email or web address]  ASSESSMENT:  CLINICAL IMPRESSION: Skilled session focused on assessing LTGs, LTG 1 on-going, LTG 2 partially met as pt was sick and did not perform HEP consistently 2/2 illness and back/hip pain. Pt demonstrated progress as she has not experienced UI since last session and was able to progress strengthening HEP to improve hip, LE and core strength to stabilize hips and reduce PFM tension. The following impairments were noted upon exam: limited ROM, back  and shoulder pain, postural dysfunction, decr. Strength, gait deviations, nocturia, urge UI. Pt would continue to benefit from skilled PT to improve safety and decr. Pain during all ADLs. PT asking for add'l visits to meet on-going goals-pt will miss two weeks of PT 2/2 taking a cruise in Jan/Feb 2026.   OBJECTIVE IMPAIRMENTS: Abnormal gait, decreased balance, decreased endurance, decreased mobility, decreased ROM, decreased strength, hypomobility, increased fascial restrictions, postural dysfunction, and pain.   ACTIVITY LIMITATIONS: carrying, lifting, bending, standing, sleeping, continence, toileting, and locomotion level  PARTICIPATION LIMITATIONS: meal prep, cleaning, laundry, and yard work  PERSONAL FACTORS: Age, Past/current experiences, Time since onset of injury/illness/exacerbation, and 3+ comorbidities: see above are also affecting patient's functional outcome.   REHAB POTENTIAL: Good  CLINICAL DECISION MAKING: Stable/uncomplicated  EVALUATION COMPLEXITY: Low   GOALS: Goals reviewed with patient? Yes  SHORT TERM GOALS: Target date: for all STGs: 12/27/23. NEW TARGET DATE: 02/21/24  Pt will be IND in HEP to improve pain, strength, coordination. Baseline: chair yoga twice a week; 12/18: performing HEP three/week and performing chair yoga twice a week. 1/15: performing HEP except for clams, walks daily (with knee pain), no chair yoga as she doesn't want to tweak her back  Goal status: PARTIALLY MET  2.  Finish exam and write goals as indicated. Baseline: limited by time constraints Goal status: MET  3.  Pt will demo proper toileting posture to fully empty bladder and reduce straining during bowel movement. Baseline: unable to demo, 12/18: able to perform, proper toileting posture has been very helpful for constipation Goal status: MET  4.  Pt will demonstrated improved relaxation and contraction of PFM with coordination of breath to reduce  urinary leakage to  </=once/week. Baseline: intermittent with urge UI when holding urine too long; 12/18: pt has not experienced leakage in the last four weeks. Goal status: MET    LONG TERM GOALS: Target date: for all LTGs: 01/24/24 NEW TARGET DATE FOR LTGS: 03/20/24   Pt will improve hip and core strength to decr. Back pain to </=2/10 at worst when bending forward or standing for >10 minutes. Baseline: 8/10 at worst; 1/15: 7-9/10 at worst and she has to stop and sit down (such as after changing sheets but it doesn't last all day). Goal status: ON-GOING  2.  Pt will demonstrated improved relaxation and contraction of PFM with coordination of breath to reduce fecal leakage to </=once/week. Baseline: occurs with soft stool; 1/15: still has trouble with breath coordinating-fecal leakage approx. Once every 3 or 4 days. Goal status: PARTIALLY MET    PLAN: add PFM contractions, review and progress strengthening HEP. Scar. Mob (ab)   PT FREQUENCY: 1x/week  PT DURATION: 8 weeks  PLANNED INTERVENTIONS: 97164- PT Re-evaluation, 97110-Therapeutic exercises, 97530- Therapeutic activity, 97112- Neuromuscular re-education, 97535- Self Care, [id number]- Manual therapy, [phone]- Gait training, [phone] (1-2 muscles), [id number] (3+ muscles)- Dry Needling, Patient/Family education, Balance training, Taping, Joint mobilization, Spinal mobilization, Scar mobilization, Cryotherapy, Moist heat, and Biofeedback    Raha Tennison L, PT 01/24/2024, 4:29 PM  Delon Pinal, PT,DPT 01/24/24 4:29 PM Phone: [phone] Fax: [phone]  "

## 2024-01-25 ENCOUNTER — Ambulatory Visit: Admitting: Internal Medicine

## 2024-01-25 ENCOUNTER — Encounter: Payer: Self-pay | Admitting: Internal Medicine

## 2024-01-25 VITALS — BP 112/64 | HR 79 | Ht 60.0 in | Wt 135.2 lb

## 2024-01-25 DIAGNOSIS — E1169 Type 2 diabetes mellitus with other specified complication: Secondary | ICD-10-CM | POA: Diagnosis not present

## 2024-01-25 DIAGNOSIS — E1159 Type 2 diabetes mellitus with other circulatory complications: Secondary | ICD-10-CM

## 2024-01-25 DIAGNOSIS — Z6826 Body mass index (BMI) 26.0-26.9, adult: Secondary | ICD-10-CM

## 2024-01-25 DIAGNOSIS — F411 Generalized anxiety disorder: Secondary | ICD-10-CM | POA: Diagnosis not present

## 2024-01-25 DIAGNOSIS — E663 Overweight: Secondary | ICD-10-CM | POA: Diagnosis not present

## 2024-01-25 DIAGNOSIS — R110 Nausea: Secondary | ICD-10-CM

## 2024-01-25 DIAGNOSIS — I152 Hypertension secondary to endocrine disorders: Secondary | ICD-10-CM | POA: Diagnosis not present

## 2024-01-25 DIAGNOSIS — E1165 Type 2 diabetes mellitus with hyperglycemia: Secondary | ICD-10-CM

## 2024-01-25 DIAGNOSIS — E782 Mixed hyperlipidemia: Secondary | ICD-10-CM

## 2024-01-25 DIAGNOSIS — E119 Type 2 diabetes mellitus without complications: Secondary | ICD-10-CM

## 2024-01-25 LAB — POCT CBG (FASTING - GLUCOSE)-MANUAL ENTRY: Glucose Fasting, POC: 118 mg/dL — AB (ref 70–99)

## 2024-01-25 MED ORDER — ONDANSETRON 4 MG PO TBDP: ORAL_TABLET | ORAL | 0 refills | Status: AC

## 2024-01-25 MED ORDER — SERTRALINE HCL 50 MG PO TABS: 50.0000 mg | ORAL_TABLET | Freq: Every day | ORAL | 3 refills | Status: AC

## 2024-01-25 NOTE — Progress Notes (Signed)
 "  Established Patient Office Visit  Subjective:  Patient ID: Nicole Young, female    DOB: July 19, 1940  Age: 84 y.o. MRN: [id number]  Chief Complaint  Patient presents with   Follow-up    4 month follow up    Patient is here today for follow up. She reports feeling well today and has no new concerns to discuss.  Patient needs bone density still to be completed. It was ordered 08/2023. Will get that scheduled at the end of today's visit to be completed.  She reports she has gotten covid and flu shots for the season. She has not gotten RSV vaccine. She is going on a cruise 02/10/24. Recommend patient get RSV soon so that way she has immunity before her upcoming cruise. She is worried about being nausea on the cruise and not having a prescription. She denies having issues with motion sickness. Will send in Zofran  to take as needed for nausea.  She has not been taking her blood pressure medication in quite some time due to it causing hypotension since losing weight from being on Ozempic. Will remain off it at this time. She sees Endocrinology regularly and they manage her diabetes medications. Patient blood sugars are well controlled but she does still have food noise and is not losing weight anymore. She is scheduled to see Endocrinology 03/2024. She reports using her CGM as prescribed without difficulty.  She reports she trailed dropping Zoloft  to 25 mg and her intrusive thoughts returned. Patient states she returned to taking 50 mg again daily and her intrusive thoughts have resolved. Will send in new prescription for 50 mg dose daily.  Patient is due for Medicare AWV 05/2024.      No other concerns at this time.   Past Medical History:  Diagnosis Date   Acute bacterial sinusitis 11/18/2021   Allergic rhinitis    AMD (acid maltase deficiency) (HCC)    Anterior knee pain 02/10/2022   Arthralgia of multiple joints 11/12/2014   Biliary dyskinesia 2025   Bursitis of hip 02/10/2022    Carpal tunnel syndrome 06/30/2014   Cervical arthritis 07/06/2016   Cholelithiasis 2025   Complication of anesthesia    slow to wake up in past   Depression    controlled   Diabetes mellitus without complication (HCC)    controlled   GERD (gastroesophageal reflux disease)    Heart palpitations 07/06/2020   Hyperlipidemia    Hypertension    Hypothyroidism    Impingement syndrome of left shoulder 02/25/2021   Iron deficiency 01/15/2023   Lipoma of foot 02/10/2022   Neuropathy    Osteoarthritis    knee, ankles, shoulders, back, neck, hands/fingers, wrists;    Osteoarthritis of knee 10/10/2017   Premature ventricular contractions 08/09/2020   Pure hypercholesterolemia 07/06/2020   Rectocele    Sleep apnea    Thyroid  disease    Vaginal atrophy 08/16/2018   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[facility])     Past Surgical History:  Procedure Laterality Date   AUGMENTATION MAMMAPLASTY  1975   AUGMENTATION MAMMAPLASTY  2017   2nd augmentation and lift   BIOPSY  07/11/2022   Procedure: BIOPSY;  Surgeon: Jinny Carmine, MD;  Location: [facility] ENDOSCOPY;  Service: Endoscopy;;   BREAST SURGERY     impants replaced with lift   CARPAL TUNNEL RELEASE Right 08/26/2014   Procedure: CARPAL TUNNEL RELEASE;  Surgeon: Kayla Pinal, MD;  Location: [facility] ORS;  Service: Orthopedics;  Laterality: Right;   COLONOSCOPY  2005  Dr Dessa   COLONOSCOPY N/A 06/24/2014   Procedure: COLONOSCOPY;  Surgeon: Reyes LELON Dessa, MD;  Location: [facility] ENDOSCOPY;  Service: Endoscopy;  Laterality: N/A;   COLONOSCOPY N/A 04/11/2023   Procedure: COLONOSCOPY;  Surgeon: Therisa Bi, MD;  Location: [facility] ENDOSCOPY;  Service: Gastroenterology;  Laterality: N/A;  please schedule in am, she is diabetic   ESOPHAGOGASTRODUODENOSCOPY (EGD) WITH PROPOFOL  N/A 07/11/2022   Procedure: ESOPHAGOGASTRODUODENOSCOPY (EGD) WITH PROPOFOL ;  Surgeon: Jinny Carmine, MD;  Location: [facility] ENDOSCOPY;  Service: Endoscopy;  Laterality: N/A;   PARATHYROIDECTOMY   1992   PLACEMENT OF BREAST IMPLANTS  1975   POLYPECTOMY  04/11/2023   Procedure: POLYPECTOMY;  Surgeon: Therisa Bi, MD;  Location: [facility] ENDOSCOPY;  Service: Gastroenterology;;   MARINE, ADENOIDECTOMY, BILATERAL MYRINGOTOMY AND TUBES     UMBILICAL HERNIA REPAIR  04/24/2023   Procedure: REPAIR, HERNIA, UMBILICAL, ADULT;  Surgeon: Rodolph Romano, MD;  Location: [facility] ORS;  Service: General;;    Social History   Socioeconomic History   Marital status: Widowed    Spouse name: Not on file   Number of children: 3   Years of education: Not on file   Highest education level: Bachelor's degree (e.g., BA, AB, BS)  Occupational History   Occupation: retired  Tobacco Use   Smoking status: Never   Smokeless tobacco: Never  Vaping Use   Vaping status: Never Used  Substance and Sexual Activity   Alcohol  use: Not Currently   Drug use: No   Sexual activity: Not on file  Other Topics Concern   Not on file  Social History Narrative   Not on file   Social Drivers of Health   Tobacco Use: Low Risk (01/25/2024)   Patient History    Smoking Tobacco Use: Never    Smokeless Tobacco Use: Never    Passive Exposure: Not on file  Financial Resource Strain: Low Risk  (07/19/2022)   Received from [facility]   Overall Financial Resource Strain (CARDIA)    Difficulty of Paying Living Expenses: Not hard at all  Food Insecurity: No Food Insecurity (04/24/2023)   Hunger Vital Sign    Worried About Running Out of Food in the Last Year: Never true    Ran Out of Food in the Last Year: Never true  Transportation Needs: No Transportation Needs (04/24/2023)   PRAPARE - Administrator, Civil Service (Medical): No    Lack of Transportation (Non-Medical): No  Physical Activity: Insufficiently Active (05/29/2023)   Exercise Vital Sign    Days of Exercise per Week: 3 days    Minutes of Exercise per Session: 30 min  Stress: No Stress Concern Present (02/22/2021)   [employer] - Occupational Stress Questionnaire    Feeling of Stress : Not at all  Social Connections: Socially Isolated (04/24/2023)   Social Connection and Isolation Panel    Frequency of Communication with Friends and Family: More than three times a week    Frequency of Social Gatherings with Friends and Family: More than three times a week    Attends Religious Services: Never    Database Administrator or Organizations: No    Attends Banker Meetings: Never    Marital Status: Widowed  Intimate Partner Violence: Not At Risk (04/24/2023)   Humiliation, Afraid, Rape, and Kick questionnaire    Fear of Current or Ex-Partner: No    Emotionally Abused: No    Physically Abused: No    Sexually Abused: No  Depression (PHQ2-9): Low Risk (05/29/2023)   Depression (PHQ2-9)    PHQ-2 Score: 4  Alcohol  Screen: Low Risk (11/18/2021)   Alcohol  Screen    Last Alcohol  Screening Score (AUDIT): 1  Housing: Low Risk (04/24/2023)   Housing Stability Vital Sign    Unable to Pay for Housing in the Last Year: No    Number of Times Moved in the Last Year: 0    Homeless in the Last Year: No  Utilities: Not At Risk (04/24/2023)   AHC Utilities    Threatened with loss of utilities: No  Health Literacy: Adequate Health Literacy (07/19/2022)   Received from [facility]   B1300 Health Literacy    Frequency of need for help with medical instructions: Never    Family History  Problem Relation Age of Onset   Cancer Father        prostate   Prostate cancer Father    Hypertension Mother    Heart attack Mother    Heart attack Maternal Grandfather    Diabetes Paternal Uncle    Congestive Heart Failure Maternal Grandmother    CVA Paternal Grandmother    Prostate cancer Paternal Grandfather    Breast cancer Maternal Aunt     Allergies[1]  Show/hide medication list[2]  Review of Systems  Constitutional: Negative.  Negative for chills, fever and  malaise/fatigue.  HENT: Negative.  Negative for congestion and sore throat.   Eyes: Negative.  Negative for blurred vision and pain.  Respiratory: Negative.  Negative for cough and shortness of breath.   Cardiovascular: Negative.  Negative for chest pain, palpitations and leg swelling.  Gastrointestinal: Negative.  Negative for abdominal pain, blood in stool, constipation, diarrhea, heartburn, melena, nausea and vomiting.  Genitourinary: Negative.  Negative for dysuria, flank pain, frequency and urgency.  Musculoskeletal: Negative.  Negative for joint pain and myalgias.  Skin: Negative.   Neurological: Negative.  Negative for dizziness, tingling, sensory change, weakness and headaches.  Endo/Heme/Allergies: Negative.   Psychiatric/Behavioral: Negative.  Negative for depression and suicidal ideas. The patient is not nervous/anxious.        Objective:   BP 112/64   Pulse 79   Ht 5' (1.524 m)   Wt 135 lb 3.2 oz (61.3 kg)   SpO2 97%   BMI 26.40 kg/m   Vitals:   01/25/24 1112  BP: 112/64  Pulse: 79  Height: 5' (1.524 m)  Weight: 135 lb 3.2 oz (61.3 kg)  SpO2: 97%  BMI (Calculated): 26.4    Physical Exam Vitals and nursing note reviewed.  Constitutional:      Appearance: Normal appearance.  HENT:     Head: Normocephalic and atraumatic.     Nose: Nose normal.     Mouth/Throat:     Mouth: Mucous membranes are moist.     Pharynx: Oropharynx is clear.  Eyes:     Conjunctiva/sclera: Conjunctivae normal.     Pupils: Pupils are equal, round, and reactive to light.  Cardiovascular:     Rate and Rhythm: Normal rate and regular rhythm.     Pulses: Normal pulses.     Heart sounds: Normal heart sounds. No murmur heard. Pulmonary:     Effort: Pulmonary effort is normal.     Breath sounds: Normal breath sounds. No wheezing.  Abdominal:     General: Bowel sounds are normal.     Palpations: Abdomen is soft.     Tenderness: There is no abdominal tenderness. There is no right CVA  tenderness or  left CVA tenderness.  Musculoskeletal:        General: Normal range of motion.     Cervical back: Normal range of motion.     Right lower leg: No edema.     Left lower leg: No edema.  Skin:    General: Skin is warm and dry.  Neurological:     General: No focal deficit present.     Mental Status: She is alert and oriented to person, place, and time.  Psychiatric:        Mood and Affect: Mood normal.        Behavior: Behavior normal.      Results for orders placed or performed in visit on 01/25/24  POCT CBG (Fasting - Glucose)  Result Value Ref Range   Glucose Fasting, POC 118 (A) 70 - 99 mg/dL    Recent Results (from the past 2160 hours)  Urinalysis, Complete     Status: None   Collection Time: 10/29/23  9:30 AM  Result Value Ref Range   Specific Gravity, UA 1.010 1.005 - 1.030   pH, UA 6.0 5.0 - 7.5   Color, UA Yellow Yellow   Appearance Ur Clear Clear   Leukocytes,UA Negative Negative   Protein,UA Negative Negative/Trace   Glucose, UA Negative Negative   Ketones, UA Negative Negative   RBC, UA Negative Negative   Bilirubin, UA Negative Negative   Urobilinogen, Ur 0.2 0.2 - 1.0 mg/dL   Nitrite, UA Negative Negative   Microscopic Examination Comment     Comment: Microscopic follows if indicated.   Microscopic Examination See below:     Comment: Microscopic was indicated and was performed.  Microscopic Examination     Status: Abnormal   Collection Time: 10/29/23  9:30 AM   Urine  Result Value Ref Range   WBC, UA 0-5 0 - 5 /hpf   RBC, Urine 0-2 0 - 2 /hpf   Epithelial Cells (non renal) >10 (A) 0 - 10 /hpf   Bacteria, UA None seen None seen/Few  POCT CBG (Fasting - Glucose)     Status: Abnormal   Collection Time: 11/27/23 11:09 AM  Result Value Ref Range   Glucose Fasting, POC 141 (A) 70 - 99 mg/dL  Comprehensive metabolic panel with GFR     Status: Abnormal   Collection Time: 12/05/23  8:33 AM  Result Value Ref Range   Sodium 137 135 - 145 mmol/L    Potassium 4.2 3.5 - 5.1 mmol/L   Chloride 104 98 - 111 mmol/L   CO2 23 22 - 32 mmol/L   Glucose, Bld 119 (H) 70 - 99 mg/dL    Comment: Glucose reference range applies only to samples taken after fasting for at least 8 hours.   BUN 26 (H) 8 - 23 mg/dL   Creatinine, Ser 9.17 0.44 - 1.00 mg/dL   Calcium  9.0 8.9 - 10.3 mg/dL   Total Protein 6.8 6.5 - 8.1 g/dL   Albumin 4.3 3.5 - 5.0 g/dL   AST 36 15 - 41 U/L   ALT 30 0 - 44 U/L   Alkaline Phosphatase 43 38 - 126 U/L   Total Bilirubin 0.4 0.0 - 1.2 mg/dL   GFR, Estimated >39 >39 mL/min    Comment: (NOTE) Calculated using the CKD-EPI Creatinine Equation (2021)    Anion gap 11 5 - 15    Comment: Performed at [facility], [address], [place], [place] [address]  CBC with Differential/Platelet     Status:  Abnormal   Collection Time: 12/05/23  8:33 AM  Result Value Ref Range   WBC 13.7 (H) 4.0 - 10.5 K/uL   RBC 4.29 3.87 - 5.11 MIL/uL   Hemoglobin 13.0 12.0 - 15.0 g/dL   HCT 61.1 63.9 - 53.9 %   MCV 90.4 80.0 - 100.0 fL   MCH 30.3 26.0 - 34.0 pg   MCHC 33.5 30.0 - 36.0 g/dL   RDW 85.4 88.4 - 84.4 %   Platelets 205 150 - 400 K/uL   nRBC 0.0 0.0 - 0.2 %   Neutrophils Relative % 84 %   Neutro Abs 11.6 (H) 1.7 - 7.7 K/uL   Lymphocytes Relative 9 %   Lymphs Abs 1.2 0.7 - 4.0 K/uL   Monocytes Relative 6 %   Monocytes Absolute 0.8 0.1 - 1.0 K/uL   Eosinophils Relative 0 %   Eosinophils Absolute 0.0 0.0 - 0.5 K/uL   Basophils Relative 0 %   Basophils Absolute 0.0 0.0 - 0.1 K/uL   Immature Granulocytes 1 %   Abs Immature Granulocytes 0.07 0.00 - 0.07 K/uL    Comment: Performed at [facility], [address] [place], [place] [address]  Troponin T, High Sensitivity     Status: None   Collection Time: 12/05/23  8:33 AM  Result Value Ref Range   Troponin T High Sensitivity <15 0 - 19 ng/L    Comment: (NOTE) Biotin concentrations > 1000 ng/mL falsely decrease TnT results.  Serial cardiac troponin measurements  are suggested.  Refer to the Links section for chest pain algorithms and additional  guidance. Performed at [facility], [address] [place], [place] [address]   Lipase, blood     Status: Abnormal   Collection Time: 12/05/23  8:33 AM  Result Value Ref Range   Lipase 63 (H) 11 - 51 U/L    Comment: Performed at [facility], [address] [place], [place] [address]  C Difficile Quick Screen w PCR reflex     Status: Abnormal   Collection Time: 12/05/23  8:52 AM   Specimen: STOOL  Result Value Ref Range   C Diff antigen POSITIVE (A) NEGATIVE   C Diff toxin NEGATIVE NEGATIVE   C Diff interpretation Results are indeterminate. See PCR results.     Comment: Performed at [facility], [address] [place], [place] [address]  C. Diff by PCR, Reflexed     Status: Abnormal   Collection Time: 12/05/23  8:52 AM  Result Value Ref Range   Toxigenic C. Difficile by PCR POSITIVE (A) NEGATIVE    Comment: Positive for toxigenic C. difficile with little to no toxin production. Only treat if clinical presentation suggests symptomatic illness.   Hypervirulent Strain PRESUMPTIVE NEGATIVE PRESUMPTIVE NEGATIVE    Comment: Performed at [facility], [address] [place], [place] [address]  Urinalysis, Routine w reflex microscopic -Urine, Clean Catch     Status: Abnormal   Collection Time: 12/05/23  9:57 AM  Result Value Ref Range   Color, Urine STRAW (A) YELLOW   APPearance CLEAR (A) CLEAR   Specific Gravity, Urine 1.013 1.005 - 1.030   pH 6.0 5.0 - 8.0   Glucose, UA NEGATIVE NEGATIVE mg/dL   Hgb urine dipstick NEGATIVE NEGATIVE   Bilirubin Urine NEGATIVE NEGATIVE   Ketones, ur NEGATIVE NEGATIVE mg/dL   Protein, ur NEGATIVE NEGATIVE mg/dL   Nitrite NEGATIVE NEGATIVE   Leukocytes,Ua NEGATIVE NEGATIVE    Comment: Performed at [facility], [address] [place], [place] [address]  Troponin T, High Sensitivity     Status: None   Collection  Time: 12/05/23 10:57 AM  Result Value Ref Range   Troponin T High Sensitivity <15 0 - 19 ng/L    Comment: (NOTE) Biotin concentrations > 1000 ng/mL falsely decrease TnT results.  Serial cardiac troponin measurements are suggested.  Refer to the Links section for chest pain algorithms and additional  guidance. Performed at [facility], [address] [place], [place] [address]   [id number]     Status: Abnormal   Collection Time: 12/25/23  7:37 AM  Result Value Ref Range   Glucose 107 (H) 70 - 99 mg/dL   BUN 21 8 - 27 mg/dL   Creatinine, Ser 9.28 0.57 - 1.00 mg/dL   eGFR 84 >40 fO/fpw/8.26   BUN/Creatinine Ratio 30 (H) 12 - 28   Sodium 138 134 - 144 mmol/L   Potassium 4.3 3.5 - 5.2 mmol/L   Chloride 101 96 - 106 mmol/L   CO2 22 20 - 29 mmol/L   Calcium  9.5 8.7 - 10.3 mg/dL   Total Protein 6.4 6.0 - 8.5 g/dL   Albumin 4.3 3.7 - 4.7 g/dL   Globulin, Total 2.1 1.5 - 4.5 g/dL   Bilirubin Total 0.3 0.0 - 1.2 mg/dL   Alkaline Phosphatase 47 (L) 48 - 129 IU/L   AST 21 0 - 40 IU/L   ALT 15 0 - 32 IU/L  Lipid Panel w/o Chol/HDL Ratio     Status: None   Collection Time: 12/25/23  7:37 AM  Result Value Ref Range   Cholesterol, Total 114 100 - 199 mg/dL   Triglycerides 83 0 - 149 mg/dL   HDL 57 >60 mg/dL   VLDL Cholesterol Cal 16 5 - 40 mg/dL   LDL Chol Calc (NIH) 41 0 - 99 mg/dL  CBC with Diff     Status: None   Collection Time: 12/25/23  7:37 AM  Result Value Ref Range   WBC 9.0 3.4 - 10.8 x10E3/uL   RBC 4.43 3.77 - 5.28 x10E6/uL   Hemoglobin 13.3 11.1 - 15.9 g/dL   Hematocrit 59.0 65.9 - 46.6 %   MCV 92 79 - 97 fL   MCH 30.0 26.6 - 33.0 pg   MCHC 32.5 31.5 - 35.7 g/dL   RDW 86.1 88.2 - 84.5 %   Platelets 205 150 - 450 x10E3/uL   Neutrophils 66 Not Estab. %   Lymphs 24 Not Estab. %   Monocytes 8 Not Estab. %   Eos 1 Not Estab. %   Basos 1 Not Estab. %   Neutrophils Absolute 5.9 1.4 - 7.0 x10E3/uL   Lymphocytes Absolute 2.2 0.7 - 3.1 x10E3/uL   Monocytes  Absolute 0.7 0.1 - 0.9 x10E3/uL   EOS (ABSOLUTE) 0.1 0.0 - 0.4 x10E3/uL   Basophils Absolute 0.1 0.0 - 0.2 x10E3/uL   Immature Granulocytes 0 Not Estab. %   Immature Grans (Abs) 0.0 0.0 - 0.1 x10E3/uL  Hemoglobin A1c     Status: Abnormal   Collection Time: 12/25/23  7:37 AM  Result Value Ref Range   Hgb A1c MFr Bld 5.9 (H) 4.8 - 5.6 %    Comment:          Prediabetes: 5.7 - 6.4          Diabetes: >6.4          Glycemic control for adults with diabetes: <7.0    Est. average  glucose Bld gHb Est-mCnc 123 mg/dL  [id number]     Status: None   Collection Time: 12/25/23  7:37 AM  Result Value Ref Range   TSH 3.720 0.450 - 4.500 uIU/mL   T3, Free 2.2 2.0 - 4.4 pg/mL   Free T4 1.26 0.82 - 1.77 ng/dL  POCT CBG (Fasting - Glucose)     Status: Abnormal   Collection Time: 01/25/24 11:17 AM  Result Value Ref Range   Glucose Fasting, POC 118 (A) 70 - 99 mg/dL      Assessment & Plan:  Take Zofran  as needed. New prescription for Zoloft  50 mg daily sent. Can stop losartan  at this time. Monitor BP at home. Restart medication if BP remains greater than 130/80 with multiple BP checks. Continue other medications as prescribed. Get DEXA scan completed. Reinforced healthy diet and staying active. Problem List Items Addressed This Visit       Cardiovascular and Mediastinum   Hypertension associated with diabetes (HCC) - Primary     Endocrine   Diabetes mellitus (HCC)   Relevant Orders   POCT CBG (Fasting - Glucose) (Completed)   Combined hyperlipidemia associated with type 2 diabetes mellitus (HCC)     Other   Overweight with body mass index (BMI) of 26 to 26.9 in adult   Nausea   Relevant Medications   ondansetron  (ZOFRAN -ODT) 4 MG disintegrating tablet   GAD (generalized anxiety disorder)   Relevant Medications   sertraline  (ZOLOFT ) 50 MG tablet    Return in about 4 months (around 05/29/2024) for Medicare AWV.   Total time spent: 30 minutes. This time includes review of previous  notes and results and patient face to face interaction during today's visit.    Nicole FREDY RAMAN, MD  01/25/2024   This document may have been prepared by [facility] Voice Recognition software and as such may include unintentional dictation errors.      [1]  Allergies Allergen Reactions   Clarithromycin  Other (See Comments)    Dizziness   Neosporin [Neomycin-Bacitracin Zn-Polymyx] Swelling   Sulfa Antibiotics Other (See Comments)    Hallucinations as a child   Thimerosal (Thiomersal) Swelling    redness   Zostavax  [Zoster Vaccine Live] Rash    Do Not Administer Zostavax to Pt!  [2]  Outpatient Medications Prior to Visit  Medication Sig   Accu-Chek FastClix Lancets MISC CHECK SUGAR ONCE DAILY. DX: E11.9   ACCU-CHEK SMARTVIEW test strip USE TO CHECK BLOOD SUGAR ONCE A DAY   ascorbic acid (VITAMIN C) 500 MG tablet Take 500 mg by mouth daily.   Cholecalciferol (VITAMIN D ) 50 MCG (2000 UT) tablet Take 2,000 Units by mouth daily.   diazepam  (VALIUM ) 5 MG tablet Take 2.5 mg by mouth daily as needed for anxiety.   estradiol  (ESTRACE ) 0.01 % CREA vaginal cream Estrogen Cream Instruction Discard applicator Apply pea sized amount to tip of finger to urethra before bed. Wash hands well after application. Use Monday, Wednesday and Friday   famotidine  (PEPCID ) 20 MG tablet Take 20 mg by mouth daily as needed for heartburn or indigestion.   levothyroxine  (SYNTHROID ) 50 MCG tablet Take 1 tablet (50 mcg total) by mouth daily before breakfast.   Magnesium 200 MG TABS Take 200 mg by mouth 4 (four) times a week.   metFORMIN  (GLUCOPHAGE -XR) 500 MG 24 hr tablet TAKE 1 TABLET TWICE A DAY (Patient taking differently: Take 500 mg by mouth daily with breakfast.)   methocarbamol (ROBAXIN) 500 MG tablet Take 500 mg by mouth  as needed for muscle spasms.   Multiple Vitamins-Minerals (PRESERVISION AREDS PO) Take 1 tablet by mouth daily.   OZEMPIC, 0.25 OR 0.5 MG/DOSE, 2 MG/3ML SOPN  (Patient taking differently:  Inject 0.375 mg into the skin once a week.)   rosuvastatin  (CRESTOR ) 20 MG tablet Take 1 tablet (20 mg total) by mouth daily.   [DISCONTINUED] sertraline  (ZOLOFT ) 25 MG tablet Take 1 tablet (25 mg total) by mouth daily. (Patient taking differently: Take 50 mg by mouth daily.)   Blood Glucose Monitoring Suppl (ACCU-CHEK GUIDE ME) w/Device KIT  (Patient not taking: Reported on 01/25/2024)   [DISCONTINUED] amoxicillin -clavulanate (AUGMENTIN ) 875-125 MG tablet Take 1 tablet by mouth 2 (two) times daily. (Patient not taking: Reported on 01/25/2024)   [DISCONTINUED] losartan  (COZAAR ) 25 MG tablet Take 1 tablet (25 mg total) by mouth at bedtime. (Patient not taking: Reported on 01/25/2024)   [DISCONTINUED] metoCLOPramide  (REGLAN ) 10 MG tablet Take 1 tablet (10 mg total) by mouth every 6 (six) hours as needed. (Patient not taking: Reported on 01/25/2024)   [DISCONTINUED] MIEBO 1.338 GM/ML SOLN Apply 1 drop to eye 4 (four) times daily. (Patient not taking: Reported on 01/25/2024)   No facility-administered medications prior to visit.   "

## 2024-01-31 ENCOUNTER — Ambulatory Visit

## 2024-02-07 ENCOUNTER — Ambulatory Visit

## 2024-02-14 ENCOUNTER — Ambulatory Visit

## 2024-02-27 ENCOUNTER — Other Ambulatory Visit

## 2024-02-28 ENCOUNTER — Ambulatory Visit: Admitting: Podiatry

## 2024-02-28 ENCOUNTER — Ambulatory Visit: Attending: Internal Medicine

## 2024-04-04 ENCOUNTER — Ambulatory Visit: Admitting: Podiatry

## 2024-05-29 ENCOUNTER — Ambulatory Visit: Admitting: Internal Medicine

## 2024-10-27 ENCOUNTER — Ambulatory Visit: Admitting: Urology
